# Patient Record
Sex: Female | Born: 1940 | Race: Black or African American | Hispanic: No | Marital: Married | Smoking: Never smoker
Health system: Southern US, Community
[De-identification: ages and names within clinical notes are randomized; demographics above are authoritative.]

## PROBLEM LIST (undated history)

## (undated) DIAGNOSIS — E119 Type 2 diabetes mellitus without complications: Secondary | ICD-10-CM

## (undated) DIAGNOSIS — N183 Chronic kidney disease, stage 3 unspecified: Secondary | ICD-10-CM

## (undated) DIAGNOSIS — Z975 Presence of (intrauterine) contraceptive device: Secondary | ICD-10-CM

## (undated) DIAGNOSIS — I8390 Asymptomatic varicose veins of unspecified lower extremity: Secondary | ICD-10-CM

## (undated) DIAGNOSIS — R21 Rash and other nonspecific skin eruption: Secondary | ICD-10-CM

## (undated) DIAGNOSIS — K8020 Calculus of gallbladder without cholecystitis without obstruction: Secondary | ICD-10-CM

## (undated) DIAGNOSIS — L0390 Cellulitis, unspecified: Secondary | ICD-10-CM

## (undated) DIAGNOSIS — G5600 Carpal tunnel syndrome, unspecified upper limb: Secondary | ICD-10-CM

## (undated) DIAGNOSIS — E662 Morbid (severe) obesity with alveolar hypoventilation: Secondary | ICD-10-CM

## (undated) DIAGNOSIS — E785 Hyperlipidemia, unspecified: Secondary | ICD-10-CM

## (undated) DIAGNOSIS — D649 Anemia, unspecified: Secondary | ICD-10-CM

## (undated) DIAGNOSIS — M1990 Unspecified osteoarthritis, unspecified site: Secondary | ICD-10-CM

## (undated) DIAGNOSIS — E039 Hypothyroidism, unspecified: Secondary | ICD-10-CM

## (undated) DIAGNOSIS — E876 Hypokalemia: Secondary | ICD-10-CM

## (undated) DIAGNOSIS — K7460 Unspecified cirrhosis of liver: Secondary | ICD-10-CM

## (undated) DIAGNOSIS — N8500 Endometrial hyperplasia, unspecified: Secondary | ICD-10-CM

## (undated) DIAGNOSIS — Z8719 Personal history of other diseases of the digestive system: Secondary | ICD-10-CM

## (undated) DIAGNOSIS — G4730 Sleep apnea, unspecified: Secondary | ICD-10-CM

## (undated) DIAGNOSIS — I10 Essential (primary) hypertension: Secondary | ICD-10-CM

## (undated) DIAGNOSIS — I5082 Biventricular heart failure: Secondary | ICD-10-CM

## (undated) DIAGNOSIS — K769 Liver disease, unspecified: Secondary | ICD-10-CM

## (undated) DIAGNOSIS — L120 Bullous pemphigoid: Secondary | ICD-10-CM

## (undated) DIAGNOSIS — I272 Pulmonary hypertension, unspecified: Secondary | ICD-10-CM

## (undated) HISTORY — DX: Essential (primary) hypertension: I10

## (undated) HISTORY — DX: Carpal tunnel syndrome, unspecified upper limb: G56.00

## (undated) HISTORY — DX: Unspecified osteoarthritis, unspecified site: M19.90

## (undated) HISTORY — DX: Liver disease, unspecified: K76.9

## (undated) HISTORY — PX: DILATION AND CURETTAGE OF UTERUS: SHX78

## (undated) HISTORY — DX: Calculus of gallbladder without cholecystitis without obstruction: K80.20

## (undated) HISTORY — DX: Morbid (severe) obesity due to excess calories: E66.01

## (undated) HISTORY — DX: Asymptomatic varicose veins of unspecified lower extremity: I83.90

## (undated) HISTORY — DX: Unspecified cirrhosis of liver: K74.60

## (undated) HISTORY — DX: Bullous pemphigoid: L12.0

## (undated) HISTORY — DX: Hypothyroidism, unspecified: E03.9

## (undated) HISTORY — DX: Hypokalemia: E87.6

## (undated) HISTORY — DX: Endometrial hyperplasia, unspecified: N85.00

## (undated) HISTORY — PX: TUBAL LIGATION: SHX77

## (undated) HISTORY — DX: Cellulitis, unspecified: L03.90

## (undated) HISTORY — DX: Hyperlipidemia, unspecified: E78.5

## (undated) HISTORY — DX: Anemia, unspecified: D64.9

## (undated) HISTORY — DX: Type 2 diabetes mellitus without complications: E11.9

---

## 1998-10-04 ENCOUNTER — Ambulatory Visit (HOSPITAL_COMMUNITY): Admission: RE | Admit: 1998-10-04 | Discharge: 1998-10-04 | Payer: Self-pay | Admitting: Endocrinology

## 1999-08-27 ENCOUNTER — Encounter: Payer: Self-pay | Admitting: Emergency Medicine

## 1999-08-27 ENCOUNTER — Emergency Department (HOSPITAL_COMMUNITY): Admission: EM | Admit: 1999-08-27 | Discharge: 1999-08-27 | Payer: Self-pay | Admitting: Emergency Medicine

## 2000-11-04 ENCOUNTER — Emergency Department (HOSPITAL_COMMUNITY): Admission: EM | Admit: 2000-11-04 | Discharge: 2000-11-04 | Payer: Self-pay | Admitting: Emergency Medicine

## 2001-06-26 ENCOUNTER — Emergency Department (HOSPITAL_COMMUNITY): Admission: EM | Admit: 2001-06-26 | Discharge: 2001-06-26 | Payer: Self-pay | Admitting: *Deleted

## 2001-09-09 ENCOUNTER — Emergency Department (HOSPITAL_COMMUNITY): Admission: EM | Admit: 2001-09-09 | Discharge: 2001-09-09 | Payer: Self-pay | Admitting: Emergency Medicine

## 2004-09-17 ENCOUNTER — Emergency Department (HOSPITAL_COMMUNITY): Admission: EM | Admit: 2004-09-17 | Discharge: 2004-09-18 | Payer: Self-pay | Admitting: Emergency Medicine

## 2007-05-22 HISTORY — PX: SHOULDER SURGERY: SHX246

## 2007-12-30 ENCOUNTER — Ambulatory Visit: Payer: Self-pay | Admitting: Vascular Surgery

## 2008-08-23 ENCOUNTER — Ambulatory Visit (HOSPITAL_COMMUNITY): Admission: RE | Admit: 2008-08-23 | Discharge: 2008-08-23 | Payer: Self-pay | Admitting: Orthopedic Surgery

## 2009-05-21 HISTORY — PX: CARDIAC CATHETERIZATION: SHX172

## 2009-11-25 ENCOUNTER — Inpatient Hospital Stay (HOSPITAL_COMMUNITY): Admission: EM | Admit: 2009-11-25 | Discharge: 2009-11-29 | Payer: Self-pay | Admitting: Emergency Medicine

## 2009-11-26 ENCOUNTER — Encounter (INDEPENDENT_AMBULATORY_CARE_PROVIDER_SITE_OTHER): Payer: Self-pay | Admitting: Internal Medicine

## 2009-11-26 ENCOUNTER — Ambulatory Visit: Payer: Self-pay | Admitting: Vascular Surgery

## 2010-01-17 ENCOUNTER — Encounter: Admission: RE | Admit: 2010-01-17 | Discharge: 2010-01-17 | Payer: Self-pay | Admitting: Cardiovascular Disease

## 2010-03-23 ENCOUNTER — Inpatient Hospital Stay (HOSPITAL_COMMUNITY): Admission: EM | Admit: 2010-03-23 | Discharge: 2010-03-28 | Payer: Self-pay | Admitting: Emergency Medicine

## 2010-04-10 ENCOUNTER — Ambulatory Visit: Payer: Self-pay | Admitting: Pulmonary Disease

## 2010-04-10 ENCOUNTER — Inpatient Hospital Stay (HOSPITAL_COMMUNITY): Admission: EM | Admit: 2010-04-10 | Discharge: 2010-04-14 | Payer: Self-pay | Admitting: Emergency Medicine

## 2010-04-14 ENCOUNTER — Encounter: Payer: Self-pay | Admitting: Pulmonary Disease

## 2010-04-17 ENCOUNTER — Ambulatory Visit (HOSPITAL_BASED_OUTPATIENT_CLINIC_OR_DEPARTMENT_OTHER)
Admission: RE | Admit: 2010-04-17 | Discharge: 2010-04-17 | Payer: Self-pay | Source: Home / Self Care | Admitting: Pulmonary Disease

## 2010-04-27 DIAGNOSIS — G4733 Obstructive sleep apnea (adult) (pediatric): Secondary | ICD-10-CM | POA: Insufficient documentation

## 2010-04-27 DIAGNOSIS — Z794 Long term (current) use of insulin: Secondary | ICD-10-CM

## 2010-04-27 DIAGNOSIS — E662 Morbid (severe) obesity with alveolar hypoventilation: Secondary | ICD-10-CM | POA: Insufficient documentation

## 2010-04-27 DIAGNOSIS — F329 Major depressive disorder, single episode, unspecified: Secondary | ICD-10-CM | POA: Insufficient documentation

## 2010-04-27 DIAGNOSIS — E039 Hypothyroidism, unspecified: Secondary | ICD-10-CM | POA: Insufficient documentation

## 2010-04-27 DIAGNOSIS — I5082 Biventricular heart failure: Secondary | ICD-10-CM | POA: Insufficient documentation

## 2010-04-27 DIAGNOSIS — E876 Hypokalemia: Secondary | ICD-10-CM | POA: Insufficient documentation

## 2010-04-27 DIAGNOSIS — I10 Essential (primary) hypertension: Secondary | ICD-10-CM | POA: Insufficient documentation

## 2010-04-27 DIAGNOSIS — E118 Type 2 diabetes mellitus with unspecified complications: Secondary | ICD-10-CM | POA: Insufficient documentation

## 2010-04-28 ENCOUNTER — Encounter: Payer: Self-pay | Admitting: Pulmonary Disease

## 2010-04-28 ENCOUNTER — Ambulatory Visit: Payer: Self-pay | Admitting: Pulmonary Disease

## 2010-06-11 ENCOUNTER — Encounter: Payer: Self-pay | Admitting: Orthopedic Surgery

## 2010-06-12 ENCOUNTER — Encounter: Payer: Self-pay | Admitting: Pulmonary Disease

## 2010-06-14 ENCOUNTER — Ambulatory Visit
Admission: RE | Admit: 2010-06-14 | Discharge: 2010-06-14 | Payer: Self-pay | Source: Home / Self Care | Attending: Pulmonary Disease | Admitting: Pulmonary Disease

## 2010-06-20 NOTE — Consult Note (Signed)
Summary: [place]  [place]   Imported By: Sherian Rein 04/20/2010 08:45:34  _____________________________________________________________________  External Attachment:    Type:   Image     Comment:   External Document

## 2010-06-20 NOTE — Assessment & Plan Note (Signed)
Summary: HFU PER RA (15 MINS ONLY) //KP   Visit Type:  Hospital Follow-up Primary Provider/Referring Provider:  Hayden Rasmussen, NP (Dr. Kellie Shropshire)   History of Present Illness: 70/F, morbidly obese for post hospital FU of obstructive sleep apnea/ OHS . Adm 04/10/10 with worsening dyspne a7 hypoxia, VQ neg, BNP 103, diuresed with lasix, diuresed, ABG 7.43/63/89/97% on 2 L, discharged on 2 L & Bilevel (empiric) ? 14/8 PSG subsequently showed moderate obstructive sleep apnea with AHi 15/h. She is to be evaluated by [facility] Daphine Deutscher ) for  a hepatic mass Echo july'11 >> EF 45-50%septal/ inferoseptal hypokinesis Algie Coffer)  April 28, 2010 4:00 PM  70/F, morbidly obese tolerating Bipap, full face mask well well, I dont have to nap, pressure OK Was able to walk at [employer] , compliant with O2, breathing better  Preventive Screening-Counseling & Management  Alcohol-Tobacco     Alcohol drinks/day: 0     Smoking Status: never  Current Medications (verified): 1)  Klor-Con M20 20 Meq Cr-Tabs (Potassium Chloride Crys Cr) .Marland Kitchen.. 1 Three Times A Day 2)  Vitamin B-12 100 Mcg Tabs (Cyanocobalamin) .Marland Kitchen.. 1 Once Daily 3)  Humulin 70/30 70-30 % Susp (Insulin Isophane & Regular) .... 20 Units At Bedtime 4)  Janumet 50-1000 Mg Tabs (Sitagliptin-Metformin Hcl) .Marland Kitchen.. 1 Two Times A Day 5)  Lasix 40 Mg Tabs (Furosemide) .... Take 1 & 1/2  Tablet By Mouth Two Times A Day 6)  Synthroid 50 Mcg Tabs (Levothyroxine Sodium) .Marland Kitchen.. 1 Once Daily 7)  Norvasc 5 Mg Tabs (Amlodipine Besylate) .Marland Kitchen.. 1 Once Daily 8)  Multivitamins  Tabs (Multiple Vitamin) .... 1/2 Once Daily 9)  Oxycodone Hcl 5 Mg Tabs (Oxycodone Hcl) .Marland Kitchen.. 1 Every 4 Hrs As Needed 10)  Zoloft 50 Mg Tabs (Sertraline Hcl) .Marland Kitchen.. 1 Once Daily 11)  Cpap 12)  Oxygen .... 2 Liters Continuous 13)  Cranberry Concentrate 500 Mg Caps (Cranberry) .... Take 3 Tablet By Mouth Once A Day  Allergies: 1)  ! Cephalosporins 2)  ! Keflex  Past History:  Past Medical History: Diabetes, Type  2 Hypothyroidism  Family History: Throat cancer-mother Family History Diabetes-maternal grandmother and father Leukemia-father  Social History: Marital Status: Married Children: Yes, 4 Occupation: Retired Child psychotherapist Patient never smoked.  Smoking Status:  never Alcohol drinks/day:  0  Review of Systems       The patient complains of shortness of breath with activity, non-productive cough, loss of appetite, weight change, tooth/dental problems, headaches, anxiety, and depression.  The patient denies shortness of breath at rest, productive cough, coughing up blood, chest pain, irregular heartbeats, acid heartburn, indigestion, abdominal pain, difficulty swallowing, sore throat, nasal congestion/difficulty breathing through nose, sneezing, itching, ear ache, hand/feet swelling, joint stiffness or pain, rash, change in color of mucus, and fever.    Vital Signs:  Patient profile:   70 year old female Height:      65 inches Weight:      380.6 pounds BMI:     63.56 O2 Sat:      91 % on 2 L/min pulsed Temp:     98.2 degrees F oral Pulse rate:   98 / minute BP sitting:   118 / 82  (left arm) Cuff size:   large  Vitals Entered By: Zackery Barefoot CMA (April 28, 2010 3:01 PM)  O2 Flow:  2 L/min pulsed Comments Medications reviewed with patient Verified contact number and pharmacy with patient Zackery Barefoot CMA  April 28, 2010 3:02 PM    Physical Exam  Additional Exam:  wt 381 April 29, 2010 Gen. Pleasant, obese, in no distress, normal affect ENT - no lesions, no post nasal drip Neck: No JVD, no thyromegaly, no carotid bruits Lungs: no use of accessory muscles, no dullness to percussion,decreased without rales or rhonchi  Cardiovascular: Rhythm regular, heart sounds  normal, no murmurs or gallops, no peripheral edema Abdomen: soft and non-tender, no hepatosplenomegaly, BS normal. Musculoskeletal: No deformities, no cyanosis or clubbing Neuro:  alert, non focal      Impression & Recommendations:  Problem # 1:  OBSTRUCTIVE SLEEP APNEA (ICD-327.23) The pathophysiology of obstructive sleep apnea, it's cardiovascular consequences and modes of treatment including CPAP were discussed with the patient in great detail. Compliance encouraged, wt loss emphasized, asked to avoid meds with sedative side effects, cautioned against driving when sleepy.  ct bilevel - obtian download Orders: Est. 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 Take 1 & 1/2  tablet by mouth two times a day 2)  Multivitamins Tabs (Multiple vitamin) .... 1/2 once daily 3)  Cpap  4)  Oxygen  .... 2 liters continuous 5)  Cranberry Concentrate 500 Mg Caps (Cranberry) .... Take 3 tablet by mouth once a day  Patient Instructions: 1)  Copy sent to: Andi Devon 2)  Please schedule a follow-up appointment in 1 month. 3)  I will take care of your biPAP machine & oxygen   Immunization History:  Influenza Immunization History:    Influenza:  historical (01/23/2010)

## 2010-06-22 NOTE — Assessment & Plan Note (Signed)
Summary: rov 1 month///kp   Visit Type:  Follow-up Primary Provider/Referring Provider:  Hayden Rasmussen, NP (Dr. Kellie Shropshire)  CC:  Pt states is using BiPAP every night. Pt c/o sweating .  History of Present Illness: 70/F, morbidly obese for post hospital FU of obstructive sleep apnea/ OHS . Adm 04/10/10 with worsening dyspnea & hypoxia, VQ neg, BNP 103, diuresed with lasix, diuresed, ABG 7.43/63/89/97% on 2 L, discharged on 2 L & Bilevel (empiric) ? 14/8 PSG subsequently showed moderate obstructive sleep apnea with AHi 15/h. She was evaluated by [facility] Donell Beers ) for  a hepatic mass >> hemangioma Echo july'11 >> EF 45-50%septal/ inferoseptal hypokinesis Algie Coffer)   June 14, 2010 3:27 PM   tolerating Bipap, full face mask well, I dont have to nap, pressure OK Was able to walk at [employer] , compliant with O2, breathing better has lost 50 lbs since july - 419 lbs then, can walk up & down stairs x3 daily now. Compliant with O2, download 12/25-1/23/12 shows good compliance, residual AHI < 3/h, avg pr 12/8 occ leak+, pressure OK, no dryness, feels refreshed & alert   Preventive Screening-Counseling & Management  Alcohol-Tobacco     Alcohol drinks/day: 0     Smoking Status: never  Current Medications (verified): 1)  Klor-Con M20 20 Meq Cr-Tabs (Potassium Chloride Crys Cr) .Marland Kitchen.. 1 Three Times A Day 2)  Vitamin B-12 100 Mcg Tabs (Cyanocobalamin) .Marland Kitchen.. 1 Once Daily 3)  Humulin 70/30 70-30 % Susp (Insulin Isophane & Regular) .... 20 Units Two Times A Day 4)  Janumet 50-1000 Mg Tabs (Sitagliptin-Metformin Hcl) .Marland Kitchen.. 1 Two Times A Day 5)  Lasix 40 Mg Tabs (Furosemide) .... Take 1 & 1/2  Tablet By Mouth Two Times A Day 6)  Synthroid 50 Mcg Tabs (Levothyroxine Sodium) .Marland Kitchen.. 1 Once Daily 7)  Norvasc 5 Mg Tabs (Amlodipine Besylate) .Marland Kitchen.. 1 Once Daily 8)  Multivitamins  Tabs (Multiple Vitamin) .... 1/2 Once Daily 9)  Oxycodone Hcl 5 Mg Tabs (Oxycodone Hcl) .Marland Kitchen.. 1 Every 4 Hrs As Needed 10)  Zoloft 50 Mg  Tabs (Sertraline Hcl) .Marland Kitchen.. 1 Once Daily 11)  Cpap 12)  Oxygen .... 2 Liters Continuous 13)  Cranberry Concentrate 500 Mg Caps (Cranberry) .... Take 3 Tablet By Mouth Once A Day 14)  Stool Softener 100 Mg Caps (Docusate Sodium) .... As Needed  Allergies (verified): 1)  ! Cephalosporins 2)  ! 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   Review of Systems  The patient denies anorexia, fever, weight loss, weight gain, vision loss, decreased hearing, hoarseness, chest pain, syncope, dyspnea on exertion, peripheral edema, prolonged cough, headaches, hemoptysis, abdominal pain, melena, hematochezia, severe indigestion/heartburn, hematuria, muscle weakness, suspicious skin lesions, difficulty walking, depression, unusual weight change, abnormal bleeding, enlarged lymph nodes, and angioedema.    Vital Signs:  Patient profile:   70 year old female Height:      65 inches Weight:      373 pounds BMI:     62.29 O2 Sat:      95 % on 2 L/min Temp:     98.2 degrees F oral Pulse rate:   81 / minute BP sitting:   110 / 70  (left arm) Cuff size:   large  Vitals Entered By: Zackery Barefoot CMA (June 14, 2010 3:00 PM)  O2 Flow:  2 L/min  O2 Sat Comments Upon arrival pt's O2 89% 2 liters pulsed, and recovered to 95% 2 liters pulsed. Zackery Barefoot CMA  June 14, 2010 3:06 PM  CC: Pt states is using BiPAP every night. Pt c/o sweating  Comments Medications reviewed with patient Verified contact number and pharmacy with patient Zackery Barefoot [facility]  June 14, 2010 3:03 PM    Physical Exam  Additional Exam:  wt 381 April 29, 2010, 373 June 15, 2010 Gen. Pleasant, obese, in no distress, normal affect ENT - no lesions, no post nasal drip Neck: No JVD, no thyromegaly, no carotid  bruits Lungs: no use of accessory muscles, no dullness to percussion,decreased without rales or rhonchi  Cardiovascular: Rhythm regular, heart sounds  normal, no murmurs or gallops, no peripheral edema Musculoskeletal: No deformities, no cyanosis or clubbing      Impression & Recommendations:  Problem # 1:  OBSTRUCTIVE SLEEP APNEA (ICD-327.23)  Excellent response to bipap & good comlpiance Compliance encouraged, wt loss emphasized, asked to avoid meds with sedative side effects, cautioned against driving when sleepy.   Orders: Est. 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  Orders: Est. Patient Level III (60454)  Medications Added to Medication List This Visit: 1)  Humulin 70/30 70-30 % Susp (Insulin isophane & regular) .... 20 units two times a day 2)  Stool Softener 100 Mg Caps (Docusate sodium) .... As needed  Patient Instructions: 1)  Copy sent [id number] [place]ton 2)  Please schedule a follow-up appointment in 3-4 months. 3)  Stay on your BiPAP machine every night - this is really helping 4)  Keep working on your weight loss 5)  Stay on oxygen until your next evaluation

## 2010-08-01 LAB — CBC
HCT: 34.5 % — ABNORMAL LOW (ref 36.0–46.0)
HCT: 34.5 % — ABNORMAL LOW (ref 36.0–46.0)
HCT: 34.8 % — ABNORMAL LOW (ref 36.0–46.0)
HCT: 34.8 % — ABNORMAL LOW (ref 36.0–46.0)
HCT: 37.3 % (ref 36.0–46.0)
Hemoglobin: 10.6 g/dL — ABNORMAL LOW (ref 12.0–15.0)
Hemoglobin: 10.8 g/dL — ABNORMAL LOW (ref 12.0–15.0)
Hemoglobin: 10.8 g/dL — ABNORMAL LOW (ref 12.0–15.0)
Hemoglobin: 10.9 g/dL — ABNORMAL LOW (ref 12.0–15.0)
Hemoglobin: 11.1 g/dL — ABNORMAL LOW (ref 12.0–15.0)
Hemoglobin: 11.2 g/dL — ABNORMAL LOW (ref 12.0–15.0)
Hemoglobin: 11.3 g/dL — ABNORMAL LOW (ref 12.0–15.0)
Hemoglobin: 11.8 g/dL — ABNORMAL LOW (ref 12.0–15.0)
MCH: 28.2 pg (ref 26.0–34.0)
MCH: 28.3 pg (ref 26.0–34.0)
MCH: 28.6 pg (ref 26.0–34.0)
MCH: 28.6 pg (ref 26.0–34.0)
MCH: 28.6 pg (ref 26.0–34.0)
MCH: 28.6 pg (ref 26.0–34.0)
MCH: 28.8 pg (ref 26.0–34.0)
MCHC: 30.7 g/dL (ref 30.0–36.0)
MCHC: 30.8 g/dL (ref 30.0–36.0)
MCHC: 31.1 g/dL (ref 30.0–36.0)
MCHC: 31.3 g/dL (ref 30.0–36.0)
MCHC: 31.3 g/dL (ref 30.0–36.0)
MCHC: 32.2 g/dL (ref 30.0–36.0)
MCV: 90.4 fL (ref 78.0–100.0)
MCV: 90.5 fL (ref 78.0–100.0)
MCV: 91.3 fL (ref 78.0–100.0)
MCV: 91.8 fL (ref 78.0–100.0)
MCV: 93 fL (ref 78.0–100.0)
Platelets: 303 10*3/uL (ref 150–400)
Platelets: 304 10*3/uL (ref 150–400)
Platelets: 308 10*3/uL (ref 150–400)
RBC: 3.76 MIL/uL — ABNORMAL LOW (ref 3.87–5.11)
RBC: 3.81 MIL/uL — ABNORMAL LOW (ref 3.87–5.11)
RBC: 3.81 MIL/uL — ABNORMAL LOW (ref 3.87–5.11)
RBC: 3.85 MIL/uL — ABNORMAL LOW (ref 3.87–5.11)
RBC: 4.12 MIL/uL (ref 3.87–5.11)
RDW: 15.5 % (ref 11.5–15.5)
RDW: 16.9 % — ABNORMAL HIGH (ref 11.5–15.5)
WBC: 12.2 10*3/uL — ABNORMAL HIGH (ref 4.0–10.5)
WBC: 12.5 10*3/uL — ABNORMAL HIGH (ref 4.0–10.5)
WBC: 9.3 10*3/uL (ref 4.0–10.5)
WBC: 9.4 10*3/uL (ref 4.0–10.5)

## 2010-08-01 LAB — DIFFERENTIAL
Basophils Absolute: 0 10*3/uL (ref 0.0–0.1)
Basophils Absolute: 0 10*3/uL (ref 0.0–0.1)
Basophils Absolute: 0 10*3/uL (ref 0.0–0.1)
Basophils Absolute: 0 10*3/uL (ref 0.0–0.1)
Basophils Absolute: 0 K/uL (ref 0.0–0.1)
Basophils Relative: 0 % (ref 0–1)
Basophils Relative: 0 % (ref 0–1)
Basophils Relative: 0 % (ref 0–1)
Eosinophils Absolute: 0.2 10*3/uL (ref 0.0–0.7)
Eosinophils Absolute: 0.2 K/uL (ref 0.0–0.7)
Eosinophils Relative: 1 % (ref 0–5)
Eosinophils Relative: 2 % (ref 0–5)
Eosinophils Relative: 2 % (ref 0–5)
Eosinophils Relative: 2 % (ref 0–5)
Lymphocytes Relative: 13 % (ref 12–46)
Lymphocytes Relative: 18 % (ref 12–46)
Lymphocytes Relative: 20 % (ref 12–46)
Lymphocytes Relative: 22 % (ref 12–46)
Lymphs Abs: 1.6 10*3/uL (ref 0.7–4.0)
Lymphs Abs: 1.7 10*3/uL (ref 0.7–4.0)
Monocytes Absolute: 0.7 10*3/uL (ref 0.1–1.0)
Monocytes Absolute: 0.8 10*3/uL (ref 0.1–1.0)
Monocytes Relative: 6 % (ref 3–12)
Neutro Abs: 6.1 10*3/uL (ref 1.7–7.7)
Neutro Abs: 6.4 10*3/uL (ref 1.7–7.7)
Neutro Abs: 9.9 K/uL — ABNORMAL HIGH (ref 1.7–7.7)
Neutrophils Relative %: 69 % (ref 43–77)
Neutrophils Relative %: 72 % (ref 43–77)
Neutrophils Relative %: 80 % — ABNORMAL HIGH (ref 43–77)

## 2010-08-01 LAB — GLUCOSE, CAPILLARY
Glucose-Capillary: 112 mg/dL — ABNORMAL HIGH (ref 70–99)
Glucose-Capillary: 114 mg/dL — ABNORMAL HIGH (ref 70–99)
Glucose-Capillary: 116 mg/dL — ABNORMAL HIGH (ref 70–99)
Glucose-Capillary: 125 mg/dL — ABNORMAL HIGH (ref 70–99)
Glucose-Capillary: 139 mg/dL — ABNORMAL HIGH (ref 70–99)
Glucose-Capillary: 140 mg/dL — ABNORMAL HIGH (ref 70–99)
Glucose-Capillary: 141 mg/dL — ABNORMAL HIGH (ref 70–99)
Glucose-Capillary: 162 mg/dL — ABNORMAL HIGH (ref 70–99)
Glucose-Capillary: 177 mg/dL — ABNORMAL HIGH (ref 70–99)
Glucose-Capillary: 181 mg/dL — ABNORMAL HIGH (ref 70–99)
Glucose-Capillary: 185 mg/dL — ABNORMAL HIGH (ref 70–99)
Glucose-Capillary: 191 mg/dL — ABNORMAL HIGH (ref 70–99)
Glucose-Capillary: 202 mg/dL — ABNORMAL HIGH (ref 70–99)
Glucose-Capillary: 206 mg/dL — ABNORMAL HIGH (ref 70–99)
Glucose-Capillary: 218 mg/dL — ABNORMAL HIGH (ref 70–99)
Glucose-Capillary: 221 mg/dL — ABNORMAL HIGH (ref 70–99)
Glucose-Capillary: 226 mg/dL — ABNORMAL HIGH (ref 70–99)
Glucose-Capillary: 227 mg/dL — ABNORMAL HIGH (ref 70–99)
Glucose-Capillary: 230 mg/dL — ABNORMAL HIGH (ref 70–99)
Glucose-Capillary: 244 mg/dL — ABNORMAL HIGH (ref 70–99)
Glucose-Capillary: 251 mg/dL — ABNORMAL HIGH (ref 70–99)
Glucose-Capillary: 254 mg/dL — ABNORMAL HIGH (ref 70–99)
Glucose-Capillary: 257 mg/dL — ABNORMAL HIGH (ref 70–99)
Glucose-Capillary: 272 mg/dL — ABNORMAL HIGH (ref 70–99)
Glucose-Capillary: 279 mg/dL — ABNORMAL HIGH (ref 70–99)
Glucose-Capillary: 428 mg/dL — ABNORMAL HIGH (ref 70–99)
Glucose-Capillary: 453 mg/dL — ABNORMAL HIGH (ref 70–99)
Glucose-Capillary: 70 mg/dL (ref 70–99)
Glucose-Capillary: 88 mg/dL (ref 70–99)

## 2010-08-01 LAB — BLOOD GAS, ARTERIAL
Bicarbonate: 41.5 mEq/L — ABNORMAL HIGH (ref 20.0–24.0)
Delivery systems: POSITIVE
Drawn by: 24487
Expiratory PAP: 8
O2 Saturation: 97.4 %
pCO2 arterial: 63 mmHg (ref 35.0–45.0)
pH, Arterial: 7.434 — ABNORMAL HIGH (ref 7.350–7.400)
pO2, Arterial: 89.1 mmHg (ref 80.0–100.0)

## 2010-08-01 LAB — BASIC METABOLIC PANEL
CO2: 41 mEq/L (ref 19–32)
Calcium: 9 mg/dL (ref 8.4–10.5)
Chloride: 88 mEq/L — ABNORMAL LOW (ref 96–112)
Chloride: 91 mEq/L — ABNORMAL LOW (ref 96–112)
Creatinine, Ser: 0.6 mg/dL (ref 0.4–1.2)
Creatinine, Ser: 0.63 mg/dL (ref 0.4–1.2)
GFR calc Af Amer: >60 mL/min (ref >60)
GFR calc Af Amer: >60 mL/min (ref >60)
GFR calc Af Amer: >60 mL/min (ref >60)
GFR calc non Af Amer: >60 mL/min (ref >60)
Glucose, Bld: 218 mg/dL — ABNORMAL HIGH (ref 70–99)
Potassium: 3.2 mEq/L — ABNORMAL LOW (ref 3.5–5.1)
Potassium: 3.3 mEq/L — ABNORMAL LOW (ref 3.5–5.1)
Sodium: 138 mEq/L (ref 135–145)
Sodium: 139 mEq/L (ref 135–145)

## 2010-08-01 LAB — POCT CARDIAC MARKERS
CKMB, poc: <1 ng/mL — ABNORMAL LOW (ref 1.0–8.0)
CKMB, poc: <1 ng/mL — ABNORMAL LOW (ref 1.0–8.0)
Myoglobin, poc: 52.2 ng/mL (ref 12–200)
Myoglobin, poc: 60.6 ng/mL (ref 12–200)
Myoglobin, poc: 65.4 ng/mL (ref 12–200)
Troponin i, poc: <0.05 ng/mL (ref 0.00–0.09)
Troponin i, poc: <0.05 ng/mL (ref 0.00–0.09)

## 2010-08-01 LAB — CULTURE, BLOOD (ROUTINE X 2): Culture: NO GROWTH

## 2010-08-01 LAB — URINE CULTURE
Colony Count: >=100000
Culture  Setup Time: 201111040124

## 2010-08-01 LAB — COMPREHENSIVE METABOLIC PANEL
ALT: 17 U/L (ref 0–35)
ALT: 19 U/L (ref 0–35)
AST: 15 U/L (ref 0–37)
AST: 15 U/L (ref 0–37)
AST: 17 U/L (ref 0–37)
AST: 18 U/L (ref 0–37)
Albumin: 3 g/dL — ABNORMAL LOW (ref 3.5–5.2)
Albumin: 3.1 g/dL — ABNORMAL LOW (ref 3.5–5.2)
Alkaline Phosphatase: 99 U/L (ref 39–117)
BUN: 14 mg/dL (ref 6–23)
BUN: 15 mg/dL (ref 6–23)
BUN: 17 mg/dL (ref 6–23)
CO2: 38 mEq/L — ABNORMAL HIGH (ref 19–32)
CO2: 38 mEq/L — ABNORMAL HIGH (ref 19–32)
CO2: 39 mEq/L — ABNORMAL HIGH (ref 19–32)
CO2: 39 mEq/L — ABNORMAL HIGH (ref 19–32)
CO2: 40 mEq/L — ABNORMAL HIGH (ref 19–32)
Calcium: 8.6 mg/dL (ref 8.4–10.5)
Calcium: 8.6 mg/dL (ref 8.4–10.5)
Calcium: 8.8 mg/dL (ref 8.4–10.5)
Calcium: 9.1 mg/dL (ref 8.4–10.5)
Chloride: 89 mEq/L — ABNORMAL LOW (ref 96–112)
Chloride: 92 mEq/L — ABNORMAL LOW (ref 96–112)
Chloride: 94 mEq/L — ABNORMAL LOW (ref 96–112)
Chloride: 94 mEq/L — ABNORMAL LOW (ref 96–112)
Chloride: 94 mEq/L — ABNORMAL LOW (ref 96–112)
Creatinine, Ser: 0.59 mg/dL (ref 0.4–1.2)
Creatinine, Ser: 0.64 mg/dL (ref 0.4–1.2)
Creatinine, Ser: 0.67 mg/dL (ref 0.4–1.2)
Creatinine, Ser: 0.68 mg/dL (ref 0.4–1.2)
Creatinine, Ser: 0.81 mg/dL (ref 0.4–1.2)
Creatinine, Ser: 0.84 mg/dL (ref 0.4–1.2)
GFR calc Af Amer: >60 mL/min (ref >60)
GFR calc Af Amer: >60 mL/min (ref >60)
GFR calc Af Amer: >60 mL/min (ref >60)
GFR calc Af Amer: >60 mL/min (ref >60)
GFR calc non Af Amer: >60 mL/min (ref >60)
GFR calc non Af Amer: >60 mL/min (ref >60)
GFR calc non Af Amer: >60 mL/min (ref >60)
GFR calc non Af Amer: >60 mL/min (ref >60)
GFR calc non Af Amer: >60 mL/min (ref >60)
Glucose, Bld: 107 mg/dL — ABNORMAL HIGH (ref 70–99)
Glucose, Bld: 188 mg/dL — ABNORMAL HIGH (ref 70–99)
Glucose, Bld: 200 mg/dL — ABNORMAL HIGH (ref 70–99)
Potassium: 3.9 mEq/L (ref 3.5–5.1)
Sodium: 138 mEq/L (ref 135–145)
Total Bilirubin: 0.5 mg/dL (ref 0.3–1.2)
Total Bilirubin: 0.5 mg/dL (ref 0.3–1.2)
Total Bilirubin: 0.6 mg/dL (ref 0.3–1.2)
Total Bilirubin: 0.7 mg/dL (ref 0.3–1.2)

## 2010-08-01 LAB — URINALYSIS, ROUTINE W REFLEX MICROSCOPIC
Bilirubin Urine: NEGATIVE
Glucose, UA: NEGATIVE mg/dL
Ketones, ur: NEGATIVE mg/dL
Ketones, ur: NEGATIVE mg/dL
Leukocytes, UA: NEGATIVE
Nitrite: NEGATIVE
Nitrite: POSITIVE — AB
Protein, ur: NEGATIVE mg/dL
Protein, ur: NEGATIVE mg/dL
Specific Gravity, Urine: 1.012 (ref 1.005–1.030)
Urobilinogen, UA: 0.2 mg/dL (ref 0.0–1.0)
Urobilinogen, UA: 1 mg/dL (ref 0.0–1.0)
pH: 7 (ref 5.0–8.0)

## 2010-08-01 LAB — POCT I-STAT, CHEM 8
Calcium, Ion: 1.01 mmol/L — ABNORMAL LOW (ref 1.12–1.32)
HCT: 41 % (ref 36.0–46.0)
Hemoglobin: 13.9 g/dL (ref 12.0–15.0)
Sodium: 138 mEq/L (ref 135–145)
TCO2: 37 mmol/L (ref 0–100)

## 2010-08-01 LAB — POCT I-STAT 3, VENOUS BLOOD GAS (G3P V)
Acid-Base Excess: 15 mmol/L — ABNORMAL HIGH (ref 0.0–2.0)
Bicarbonate: 44.6 meq/L — ABNORMAL HIGH (ref 20.0–24.0)
O2 Saturation: 51 %
Patient temperature: 98
TCO2: 47 mmol/L (ref 0–100)
pCO2, Ven: 75.5 mmHg (ref 45.0–50.0)
pH, Ven: 7.378 — ABNORMAL HIGH (ref 7.250–7.300)
pO2, Ven: 29 mmHg — CL (ref 30.0–45.0)

## 2010-08-01 LAB — URINE MICROSCOPIC-ADD ON

## 2010-08-01 LAB — BASIC METABOLIC PANEL WITH GFR
BUN: 14 mg/dL (ref 6–23)
CO2: 40 meq/L — ABNORMAL HIGH (ref 19–32)
Calcium: 8.3 mg/dL — ABNORMAL LOW (ref 8.4–10.5)
Creatinine, Ser: 0.49 mg/dL (ref 0.4–1.2)
GFR calc non Af Amer: >60 mL/min (ref >60)
Glucose, Bld: 161 mg/dL — ABNORMAL HIGH (ref 70–99)
Sodium: 138 meq/L (ref 135–145)

## 2010-08-01 LAB — SEDIMENTATION RATE: Sed Rate: 31 mm/hr — ABNORMAL HIGH (ref 0–22)

## 2010-08-01 LAB — MAGNESIUM
Magnesium: 1.3 mg/dL — ABNORMAL LOW (ref 1.5–2.5)
Magnesium: 1.4 mg/dL — ABNORMAL LOW (ref 1.5–2.5)
Magnesium: 1.4 mg/dL — ABNORMAL LOW (ref 1.5–2.5)
Magnesium: 1.4 mg/dL — ABNORMAL LOW (ref 1.5–2.5)

## 2010-08-01 LAB — BRAIN NATRIURETIC PEPTIDE
Pro B Natriuretic peptide (BNP): 103 pg/mL — ABNORMAL HIGH (ref 0.0–100.0)
Pro B Natriuretic peptide (BNP): 207 pg/mL — ABNORMAL HIGH (ref 0.0–100.0)
Pro B Natriuretic peptide (BNP): 270 pg/mL — ABNORMAL HIGH (ref 0.0–100.0)

## 2010-08-01 LAB — GLUCOSE, RANDOM: Glucose, Bld: 447 mg/dL — ABNORMAL HIGH (ref 70–99)

## 2010-08-01 LAB — CK TOTAL AND CKMB (NOT AT ARMC)
CK, MB: 0.7 ng/mL (ref 0.3–4.0)
Relative Index: INVALID (ref 0.0–2.5)
Total CK: 28 U/L (ref 7–177)

## 2010-08-01 LAB — T4, FREE: Free T4: 1.26 ng/dL (ref 0.80–1.80)

## 2010-08-01 LAB — TSH
TSH: 0.612 u[IU]/mL (ref 0.350–4.500)
TSH: 3.543 u[IU]/mL (ref 0.350–4.500)

## 2010-08-01 LAB — TROPONIN I: Troponin I: 0.02 ng/mL (ref 0.00–0.06)

## 2010-08-01 LAB — PROTIME-INR: INR: 1 (ref 0.00–1.49)

## 2010-08-01 LAB — AFP TUMOR MARKER: AFP-Tumor Marker: 2.2 ng/mL (ref 0.0–8.0)

## 2010-08-06 LAB — GLUCOSE, CAPILLARY
Glucose-Capillary: 100 mg/dL — ABNORMAL HIGH (ref 70–99)
Glucose-Capillary: 106 mg/dL — ABNORMAL HIGH (ref 70–99)
Glucose-Capillary: 132 mg/dL — ABNORMAL HIGH (ref 70–99)
Glucose-Capillary: 132 mg/dL — ABNORMAL HIGH (ref 70–99)
Glucose-Capillary: 165 mg/dL — ABNORMAL HIGH (ref 70–99)
Glucose-Capillary: 174 mg/dL — ABNORMAL HIGH (ref 70–99)
Glucose-Capillary: 184 mg/dL — ABNORMAL HIGH (ref 70–99)
Glucose-Capillary: 186 mg/dL — ABNORMAL HIGH (ref 70–99)
Glucose-Capillary: 220 mg/dL — ABNORMAL HIGH (ref 70–99)
Glucose-Capillary: 314 mg/dL — ABNORMAL HIGH (ref 70–99)

## 2010-08-06 LAB — DIFFERENTIAL
Eosinophils Absolute: 0.2 10*3/uL (ref 0.0–0.7)
Eosinophils Relative: 2 % (ref 0–5)
Lymphocytes Relative: 22 % (ref 12–46)
Lymphs Abs: 2.3 10*3/uL (ref 0.7–4.0)
Monocytes Absolute: 0.6 10*3/uL (ref 0.1–1.0)

## 2010-08-06 LAB — CARDIAC PANEL(CRET KIN+CKTOT+MB+TROPI)
Relative Index: INVALID (ref 0.0–2.5)
Total CK: 48 U/L (ref 7–177)
Troponin I: 0.01 ng/mL (ref 0.00–0.06)

## 2010-08-06 LAB — T4, FREE: Free T4: 1.18 ng/dL (ref 0.80–1.80)

## 2010-08-06 LAB — BASIC METABOLIC PANEL
BUN: 34 mg/dL — ABNORMAL HIGH (ref 6–23)
CO2: 33 mEq/L — ABNORMAL HIGH (ref 19–32)
Calcium: 9.1 mg/dL (ref 8.4–10.5)
Chloride: 101 mEq/L (ref 96–112)
Chloride: 98 mEq/L (ref 96–112)
Creatinine, Ser: 0.9 mg/dL (ref 0.4–1.2)
GFR calc Af Amer: >60 mL/min (ref >60)
GFR calc Af Amer: >60 mL/min (ref >60)
GFR calc non Af Amer: >60 mL/min (ref >60)
GFR calc non Af Amer: >60 mL/min (ref >60)
Potassium: 5 mEq/L (ref 3.5–5.1)
Potassium: 5.1 mEq/L (ref 3.5–5.1)
Potassium: 5.1 mEq/L (ref 3.5–5.1)
Sodium: 135 mEq/L (ref 135–145)
Sodium: 137 mEq/L (ref 135–145)
Sodium: 138 mEq/L (ref 135–145)

## 2010-08-06 LAB — COMPREHENSIVE METABOLIC PANEL
AST: 19 U/L (ref 0–37)
Albumin: 3 g/dL — ABNORMAL LOW (ref 3.5–5.2)
Alkaline Phosphatase: 93 U/L (ref 39–117)
BUN: 24 mg/dL — ABNORMAL HIGH (ref 6–23)
GFR calc Af Amer: >60 mL/min (ref >60)
Potassium: 4.7 mEq/L (ref 3.5–5.1)
Total Protein: 6.8 g/dL (ref 6.0–8.3)

## 2010-08-06 LAB — POTASSIUM: Potassium: 6.6 mEq/L (ref 3.5–5.1)

## 2010-08-06 LAB — CBC
HCT: 33.6 % — ABNORMAL LOW (ref 36.0–46.0)
HCT: 38.7 % (ref 36.0–46.0)
Hemoglobin: 10.7 g/dL — ABNORMAL LOW (ref 12.0–15.0)
MCH: 29 pg (ref 26.0–34.0)
MCV: 91 fL (ref 78.0–100.0)
MCV: 91.7 fL (ref 78.0–100.0)
Platelets: 265 10*3/uL (ref 150–400)
Platelets: 289 10*3/uL (ref 150–400)
RBC: 4.25 MIL/uL (ref 3.87–5.11)
RDW: 16.2 % — ABNORMAL HIGH (ref 11.5–15.5)
RDW: 16.3 % — ABNORMAL HIGH (ref 11.5–15.5)
WBC: 10.4 10*3/uL (ref 4.0–10.5)
WBC: 7.2 10*3/uL (ref 4.0–10.5)
WBC: 9.4 10*3/uL (ref 4.0–10.5)

## 2010-08-06 LAB — POCT I-STAT, CHEM 8
BUN: 29 mg/dL — ABNORMAL HIGH (ref 6–23)
Calcium, Ion: 1.04 mmol/L — ABNORMAL LOW (ref 1.12–1.32)
Creatinine, Ser: 0.8 mg/dL (ref 0.4–1.2)
Glucose, Bld: 149 mg/dL — ABNORMAL HIGH (ref 70–99)
Hemoglobin: 14.6 g/dL (ref 12.0–15.0)
TCO2: 28 mmol/L (ref 0–100)

## 2010-08-06 LAB — MAGNESIUM
Magnesium: 1.7 mg/dL (ref 1.5–2.5)
Magnesium: 1.8 mg/dL (ref 1.5–2.5)
Magnesium: 1.9 mg/dL (ref 1.5–2.5)

## 2010-08-06 LAB — BRAIN NATRIURETIC PEPTIDE: Pro B Natriuretic peptide (BNP): 154 pg/mL — ABNORMAL HIGH (ref 0.0–100.0)

## 2010-08-06 LAB — TSH: TSH: 7.283 u[IU]/mL — ABNORMAL HIGH (ref 0.350–4.500)

## 2010-08-06 LAB — HEMOGLOBIN A1C: Mean Plasma Glucose: 206 mg/dL — ABNORMAL HIGH (ref <117)

## 2010-08-30 LAB — GLUCOSE, CAPILLARY

## 2010-08-31 LAB — COMPREHENSIVE METABOLIC PANEL
AST: 13 U/L (ref 0–37)
BUN: 19 mg/dL (ref 6–23)
CO2: 30 mEq/L (ref 19–32)
Calcium: 9.2 mg/dL (ref 8.4–10.5)
Creatinine, Ser: 0.68 mg/dL (ref 0.4–1.2)
GFR calc Af Amer: >60 mL/min (ref >60)
GFR calc non Af Amer: >60 mL/min (ref >60)
Total Bilirubin: 0.3 mg/dL (ref 0.3–1.2)

## 2010-08-31 LAB — URINE MICROSCOPIC-ADD ON

## 2010-08-31 LAB — URINALYSIS, ROUTINE W REFLEX MICROSCOPIC
Bilirubin Urine: NEGATIVE
Glucose, UA: NEGATIVE mg/dL
Ketones, ur: NEGATIVE mg/dL
Nitrite: POSITIVE — AB
Specific Gravity, Urine: 1.022 (ref 1.005–1.030)
pH: 7 (ref 5.0–8.0)

## 2010-08-31 LAB — DIFFERENTIAL
Basophils Absolute: 0.1 10*3/uL (ref 0.0–0.1)
Lymphocytes Relative: 26 % (ref 12–46)
Lymphs Abs: 1.8 10*3/uL (ref 0.7–4.0)
Neutro Abs: 4.3 10*3/uL (ref 1.7–7.7)
Neutrophils Relative %: 63 % (ref 43–77)

## 2010-08-31 LAB — URINE CULTURE

## 2010-08-31 LAB — CBC
HCT: 36.2 % (ref 36.0–46.0)
MCHC: 32.1 g/dL (ref 30.0–36.0)
MCV: 89.9 fL (ref 78.0–100.0)
RBC: 4.02 MIL/uL (ref 3.87–5.11)

## 2010-08-31 LAB — APTT: aPTT: 29 seconds (ref 24–37)

## 2010-08-31 LAB — PROTIME-INR
INR: 0.9 (ref 0.00–1.49)
Prothrombin Time: 12.1 seconds (ref 11.6–15.2)

## 2010-09-21 ENCOUNTER — Encounter (INDEPENDENT_AMBULATORY_CARE_PROVIDER_SITE_OTHER): Payer: Self-pay | Admitting: Surgery

## 2010-09-25 ENCOUNTER — Ambulatory Visit (INDEPENDENT_AMBULATORY_CARE_PROVIDER_SITE_OTHER): Payer: Medicare Other | Admitting: Pulmonary Disease

## 2010-09-25 ENCOUNTER — Encounter: Payer: Self-pay | Admitting: Pulmonary Disease

## 2010-09-25 VITALS — BP 120/80 | HR 67 | Temp 98.9°F | Ht 61.0 in | Wt 356.2 lb

## 2010-09-25 DIAGNOSIS — G4733 Obstructive sleep apnea (adult) (pediatric): Secondary | ICD-10-CM

## 2010-09-25 DIAGNOSIS — E662 Morbid (severe) obesity with alveolar hypoventilation: Secondary | ICD-10-CM

## 2010-09-25 NOTE — Progress Notes (Signed)
  Subjective:    Patient ID: Nina Howell, female    DOB: 1941-01-11, 70 y.o.   MRN: [id number]  HPI Primary Provider/Referring Provider: Hayden Rasmussen, NP (Dr. Kellie Shropshire)   69/F, morbidly obese for post hospital FU of obstructive sleep apnea/ OHS .  Adm 04/10/10 with worsening dyspnea & hypoxia, VQ neg, BNP 103, diuresed with lasix, diuresed, ABG 7.43/63/89/97% on 2 L, discharged on 2 L & Bilevel (empiric) ? 14/8  PSG subsequently showed moderate obstructive sleep apnea with AHi 15/h.  She was evaluated by [facility] Daphine Deutscher ) for a hepatic mass >> hemangioma  Echo july'11 >> EF 45-50%septal/ inferoseptal hypokinesis Algie Coffer)   April 28, 2010  tolerating Bipap, full face mask well, I dont have to nap, pressure OK  Was able to walk at [employer] , compliant with O2, breathing better   09/25/2010 Reviewed download dec-jan'12 >> excellent comppliance & control of event son VPAP auto Wt 381 >> 356 lbs in last 4 mnths Doing well with diet >> salads Water aerobics Compliant with BiPAP every night     Review of Systems Pt denies any significant  nasal congestion or excess secretions, fever, chills, sweats, unintended wt loss, pleuritic or exertional cp, orthopnea pnd or leg swelling.  Pt also denies any obvious fluctuation in symptoms with weather or environmental change or other alleviating or aggravating factors.    Pt denies any increase in rescue therapy over baseline, denies waking up needing it or having early am exacerbations or coughing/wheezing/ or dyspnea       Objective:   Physical Exam Gen. Pleasant, obese, in no distress ENT - no lesions, no post nasal drip Neck: No JVD, no thyromegaly, no carotid bruits Lungs: no use of accessory muscles, no dullness to percussion, clear without rales or rhonchi  Cardiovascular: Rhythm regular, heart sounds  normal, no murmurs or gallops, no peripheral edema Musculoskeletal: No deformities, no cyanosis or clubbing          Assessment  & Plan:

## 2010-09-25 NOTE — Patient Instructions (Signed)
Referral to Pulm rehab program Stay on biPAP machine every night Congratulations on your weight loss. Keep it going !

## 2010-09-25 NOTE — Assessment & Plan Note (Signed)
Ct BiPAP Weight loss encouraged, compliance with goal of at least 4-6 hrs every night is the expectation. Advised against medications with sedative side effects Cautioned against driving when sleepy - understanding that sleepiness will vary on a day to day basis

## 2010-09-25 NOTE — Assessment & Plan Note (Addendum)
referral to pulm rehab  Hopefully this will encourage & sustain wt loss.

## 2010-10-03 NOTE — Assessment & Plan Note (Signed)
OFFICE VISIT   Nina Howell, Nina Howell  DOB:  01-18-1941                                       12/30/2007  DGLOV#:[id number]   I saw the patient in the office today for continued followup of her  varicose veins.  I last saw her in March 2006.  At that time we simply  recommended compression stockings and leg elevation.  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  She has delivered 5 children and has had  varicose veins essentially all of her life.  The symptoms associated  with these varicosities are aching and pain.  She had no previous  history of phlebitis or DVT that she is aware of.  She has tried to wear  thigh-high compression stockings for over a year but because of her size  and obesity, it is very difficult to get these to fit appropriately.  She has tried different techniques including glue on the skin to help  maintain positioning of the socks, but has not been successful with  wearing compression stockings.  She does try to elevate her legs  intermittently.   PAST MEDICAL HISTORY:  Significant for adult-onset diabetes.   SOCIAL HISTORY:  She is married and has 4 living children.  She does not  use tobacco.   REVIEW OF SYSTEMS AND MEDICATIONS:  Documented on the medical history  form in her chart.   PHYSICAL EXAMINATION:  This is a pleasant 70 year old woman who appears  her stated age.  Blood pressure 160/75, heart rate is 62.  Lungs are  clear bilaterally to auscultation.  Cardiac exam, she has a regular rate  and rhythm.  Abdomen is obese and difficult to assess.  I cannot palpate  pedal pulses but both feet are warm and well perfused without ischemic  ulcers.  She does have some varicosities along the medial and lateral  aspect of both legs with no evidence of active phlebitis.  She does not  have any significant hyperpigmentation to suggest chronic venous  insufficiency.   The patient has varicose veins which are symptomatic.  However, she  would prefer to continue with attempted conservative treatment including  leg elevation and compression therapy as tolerated.  If her symptoms  progress, I have explained that she could be seen in [facility]  here and be considered for sclerotherapy or laser therapy.  She has not  had a recent duplex scan in our office.   Di Kindle. Edilia Bo, M.D.  Electronically Signed   CSD/MEDQ  D:  12/30/2007  T:  12/31/2007  Job:  [id number]   cc:   Alfonse Alpers. Dagoberto Ligas, M.D.

## 2010-10-03 NOTE — Op Note (Signed)
NAMESARAHY, CREEDON NO.:  [id number]   MEDICAL RECORD NO.:  [id number]          PATIENT TYPE:  AMB   LOCATION:  SDS                          FACILITY:  [facility]   PHYSICIAN:  Mila Homer. Sherlean Foot, M.D. DATE OF BIRTH:  05/23/1940   DATE OF PROCEDURE:  08/23/2008  DATE OF DISCHARGE:  08/23/2008                               OPERATIVE REPORT   SURGEON:  Mila Homer. Sherlean Foot, MD   ASSISTANT:  Altamese Cabal, PA-C   ANESTHESIA:  General.   PREOPERATIVE DIAGNOSES:  1. Right shoulder rotator cuff tear.  2. Acromioclavicular joint arthritis.  3. Type 2 acromion.   POSTOPERATIVE DIAGNOSES:  1. Right shoulder rotator cuff tear.  2. Acromioclavicular joint arthritis.  3. Type 2 acromion.   PROCEDURE:  Right shoulder arthroscopy, subacromial decompression,  distal clavicle resection, glenohumeral debridement, and mini-open  rotator cuff repair.   After an informed consent was obtained.   DESCRIPTION OF PROCEDURE:  The patient was laid supine, administered  general anesthesia, placed in beach-chair position.  Anterior-posterior  direct lateral portals were created with #11 blade blunt trocar and  cannula.  Diagnostic arthroscopy revealed radial tearing of the labrum.  I have debrided this to the anterior portal.  There was also more  arthritis, which I debrided as well.  I then redirected scope to the  subacromial space and posterior portal.  I then performed a subacromial  bursectomy with the [employer] shaver, I then used the ArthroCare  debridement wand and cut the undersurface of the acromion and distal  clavicle.  I have released the [facility] ligament.  I then used 4.0-mm  cylindrical bur to perform aggressive anterolateral acromioplasty and  distal clavicle resection.  I identified the rotator cuff tear, it was  about 1 cm in length at the biceps interval and not a detachment of the  supraspinatus rather a longitudinal tear.  The crura to mini-open  technique where I  extended to direct lateral portal up with a #15 blade.  I placed an Arthrex shoulder retractor in place.  I then placed 2 simple  side-to-side #2 FiberWire sutures through the rotator cuff tied that  down tightly.  I then irrigated and closed with 0 and 2-0 Vicryl  sutures, and Steri-Strips.  I then closed with Xeroform dressing sponges  near shoulder dressing.  Complications none.  Drains none.          ______________________________  Mila Homer. Sherlean Foot, M.D.    SDL/MEDQ  D:  08/23/2008  T:  08/23/2008  Job:  [id number]

## 2010-11-01 ENCOUNTER — Encounter (HOSPITAL_COMMUNITY)
Admission: RE | Admit: 2010-11-01 | Discharge: 2010-11-01 | Disposition: A | Discharge status: Home / Self Care | Payer: BC Managed Care – PPO | Source: Ambulatory Visit | Attending: Pulmonary Disease | Admitting: Pulmonary Disease

## 2010-11-01 DIAGNOSIS — E039 Hypothyroidism, unspecified: Secondary | ICD-10-CM | POA: Insufficient documentation

## 2010-11-01 DIAGNOSIS — E119 Type 2 diabetes mellitus without complications: Secondary | ICD-10-CM | POA: Insufficient documentation

## 2010-11-01 DIAGNOSIS — G4733 Obstructive sleep apnea (adult) (pediatric): Secondary | ICD-10-CM | POA: Insufficient documentation

## 2010-11-01 DIAGNOSIS — E662 Morbid (severe) obesity with alveolar hypoventilation: Secondary | ICD-10-CM | POA: Insufficient documentation

## 2010-11-01 DIAGNOSIS — R0902 Hypoxemia: Secondary | ICD-10-CM | POA: Insufficient documentation

## 2010-11-01 DIAGNOSIS — Z5189 Encounter for other specified aftercare: Secondary | ICD-10-CM | POA: Insufficient documentation

## 2010-11-07 ENCOUNTER — Encounter (HOSPITAL_COMMUNITY): Payer: BC Managed Care – PPO

## 2010-11-09 ENCOUNTER — Encounter (HOSPITAL_COMMUNITY): Payer: BC Managed Care – PPO

## 2010-11-14 ENCOUNTER — Encounter (HOSPITAL_COMMUNITY): Payer: BC Managed Care – PPO

## 2010-11-16 ENCOUNTER — Encounter (HOSPITAL_COMMUNITY): Payer: BC Managed Care – PPO

## 2010-11-21 ENCOUNTER — Encounter (HOSPITAL_COMMUNITY): Payer: BC Managed Care – PPO | Attending: Pulmonary Disease

## 2010-11-21 DIAGNOSIS — E662 Morbid (severe) obesity with alveolar hypoventilation: Secondary | ICD-10-CM | POA: Insufficient documentation

## 2010-11-21 DIAGNOSIS — R0902 Hypoxemia: Secondary | ICD-10-CM | POA: Insufficient documentation

## 2010-11-21 DIAGNOSIS — Z5189 Encounter for other specified aftercare: Secondary | ICD-10-CM | POA: Insufficient documentation

## 2010-11-21 DIAGNOSIS — G4733 Obstructive sleep apnea (adult) (pediatric): Secondary | ICD-10-CM | POA: Insufficient documentation

## 2010-11-21 DIAGNOSIS — E119 Type 2 diabetes mellitus without complications: Secondary | ICD-10-CM | POA: Insufficient documentation

## 2010-11-21 DIAGNOSIS — E039 Hypothyroidism, unspecified: Secondary | ICD-10-CM | POA: Insufficient documentation

## 2010-11-23 ENCOUNTER — Encounter (HOSPITAL_COMMUNITY): Payer: BC Managed Care – PPO

## 2010-11-28 ENCOUNTER — Encounter (HOSPITAL_COMMUNITY): Payer: BC Managed Care – PPO

## 2010-11-30 ENCOUNTER — Encounter (HOSPITAL_COMMUNITY): Payer: BC Managed Care – PPO

## 2010-12-05 ENCOUNTER — Encounter (HOSPITAL_COMMUNITY): Payer: BC Managed Care – PPO

## 2010-12-07 ENCOUNTER — Encounter (HOSPITAL_COMMUNITY): Payer: BC Managed Care – PPO

## 2010-12-12 ENCOUNTER — Encounter (HOSPITAL_COMMUNITY): Payer: BC Managed Care – PPO

## 2010-12-14 ENCOUNTER — Encounter (HOSPITAL_COMMUNITY): Payer: BC Managed Care – PPO

## 2010-12-19 ENCOUNTER — Encounter (HOSPITAL_COMMUNITY): Payer: BC Managed Care – PPO

## 2010-12-21 ENCOUNTER — Encounter (HOSPITAL_COMMUNITY): Payer: BC Managed Care – PPO | Attending: Pulmonary Disease

## 2010-12-21 DIAGNOSIS — E119 Type 2 diabetes mellitus without complications: Secondary | ICD-10-CM | POA: Insufficient documentation

## 2010-12-21 DIAGNOSIS — E662 Morbid (severe) obesity with alveolar hypoventilation: Secondary | ICD-10-CM | POA: Insufficient documentation

## 2010-12-21 DIAGNOSIS — E039 Hypothyroidism, unspecified: Secondary | ICD-10-CM | POA: Insufficient documentation

## 2010-12-21 DIAGNOSIS — Z5189 Encounter for other specified aftercare: Secondary | ICD-10-CM | POA: Insufficient documentation

## 2010-12-21 DIAGNOSIS — R0902 Hypoxemia: Secondary | ICD-10-CM | POA: Insufficient documentation

## 2010-12-21 DIAGNOSIS — G4733 Obstructive sleep apnea (adult) (pediatric): Secondary | ICD-10-CM | POA: Insufficient documentation

## 2010-12-26 ENCOUNTER — Encounter (HOSPITAL_COMMUNITY): Payer: BC Managed Care – PPO

## 2010-12-28 ENCOUNTER — Encounter (HOSPITAL_COMMUNITY): Payer: BC Managed Care – PPO

## 2011-01-01 ENCOUNTER — Telehealth: Payer: Self-pay | Admitting: Pulmonary Disease

## 2011-01-01 NOTE — Telephone Encounter (Signed)
OK to give this?

## 2011-01-01 NOTE — Telephone Encounter (Signed)
Pt's son is incarcerated and the prison is requesting that the pt bring a letter from her doctor stating that she needs to have portable oxygen so she may enter the prison with it on. This needs to be on letterhead. Pls advise.

## 2011-01-02 ENCOUNTER — Encounter (HOSPITAL_COMMUNITY): Payer: BC Managed Care – PPO

## 2011-01-03 ENCOUNTER — Encounter: Payer: Self-pay | Admitting: *Deleted

## 2011-01-03 NOTE — Telephone Encounter (Signed)
Letter placed at front desk for pick lmom of same and to call if needed.

## 2011-01-04 ENCOUNTER — Encounter (HOSPITAL_COMMUNITY): Payer: BC Managed Care – PPO

## 2011-01-09 ENCOUNTER — Encounter (HOSPITAL_COMMUNITY): Payer: BC Managed Care – PPO

## 2011-01-11 ENCOUNTER — Encounter (HOSPITAL_COMMUNITY): Payer: BC Managed Care – PPO

## 2011-01-16 ENCOUNTER — Encounter (HOSPITAL_COMMUNITY): Payer: BC Managed Care – PPO

## 2011-01-18 ENCOUNTER — Encounter (HOSPITAL_COMMUNITY): Payer: BC Managed Care – PPO

## 2011-01-23 ENCOUNTER — Encounter (HOSPITAL_COMMUNITY): Payer: BC Managed Care – PPO | Attending: Pulmonary Disease

## 2011-01-23 DIAGNOSIS — E662 Morbid (severe) obesity with alveolar hypoventilation: Secondary | ICD-10-CM | POA: Insufficient documentation

## 2011-01-23 DIAGNOSIS — G4733 Obstructive sleep apnea (adult) (pediatric): Secondary | ICD-10-CM | POA: Insufficient documentation

## 2011-01-23 DIAGNOSIS — R0902 Hypoxemia: Secondary | ICD-10-CM | POA: Insufficient documentation

## 2011-01-23 DIAGNOSIS — E119 Type 2 diabetes mellitus without complications: Secondary | ICD-10-CM | POA: Insufficient documentation

## 2011-01-23 DIAGNOSIS — E039 Hypothyroidism, unspecified: Secondary | ICD-10-CM | POA: Insufficient documentation

## 2011-01-23 DIAGNOSIS — Z5189 Encounter for other specified aftercare: Secondary | ICD-10-CM | POA: Insufficient documentation

## 2011-01-25 ENCOUNTER — Encounter (HOSPITAL_COMMUNITY): Payer: BC Managed Care – PPO

## 2011-01-30 ENCOUNTER — Encounter (HOSPITAL_COMMUNITY): Payer: BC Managed Care – PPO

## 2011-02-01 ENCOUNTER — Encounter (HOSPITAL_COMMUNITY): Payer: BC Managed Care – PPO

## 2011-02-06 ENCOUNTER — Encounter (HOSPITAL_COMMUNITY): Payer: BC Managed Care – PPO

## 2011-02-08 ENCOUNTER — Encounter (HOSPITAL_COMMUNITY): Payer: BC Managed Care – PPO

## 2011-02-13 ENCOUNTER — Encounter (HOSPITAL_COMMUNITY): Payer: BC Managed Care – PPO

## 2011-02-15 ENCOUNTER — Encounter (HOSPITAL_COMMUNITY): Payer: BC Managed Care – PPO

## 2011-02-20 ENCOUNTER — Encounter (HOSPITAL_COMMUNITY)
Admission: RE | Admit: 2011-02-20 | Discharge: 2011-02-20 | Disposition: A | Discharge status: Home / Self Care | Payer: Self-pay | Source: Ambulatory Visit | Attending: Pulmonary Disease | Admitting: Pulmonary Disease

## 2011-02-20 DIAGNOSIS — E662 Morbid (severe) obesity with alveolar hypoventilation: Secondary | ICD-10-CM | POA: Insufficient documentation

## 2011-02-20 DIAGNOSIS — G4733 Obstructive sleep apnea (adult) (pediatric): Secondary | ICD-10-CM | POA: Insufficient documentation

## 2011-02-20 DIAGNOSIS — R0902 Hypoxemia: Secondary | ICD-10-CM | POA: Insufficient documentation

## 2011-02-20 DIAGNOSIS — Z5189 Encounter for other specified aftercare: Secondary | ICD-10-CM | POA: Insufficient documentation

## 2011-02-20 DIAGNOSIS — E039 Hypothyroidism, unspecified: Secondary | ICD-10-CM | POA: Insufficient documentation

## 2011-02-20 DIAGNOSIS — E119 Type 2 diabetes mellitus without complications: Secondary | ICD-10-CM | POA: Insufficient documentation

## 2011-02-22 ENCOUNTER — Encounter (HOSPITAL_COMMUNITY): Payer: Self-pay

## 2011-02-27 ENCOUNTER — Encounter (HOSPITAL_COMMUNITY): Payer: Self-pay

## 2011-03-01 ENCOUNTER — Encounter (HOSPITAL_COMMUNITY): Payer: Self-pay

## 2011-03-06 ENCOUNTER — Encounter (HOSPITAL_COMMUNITY): Payer: Self-pay

## 2011-03-08 ENCOUNTER — Encounter (HOSPITAL_COMMUNITY): Payer: Self-pay

## 2011-03-13 ENCOUNTER — Encounter (HOSPITAL_COMMUNITY): Payer: Self-pay

## 2011-03-15 ENCOUNTER — Encounter (HOSPITAL_COMMUNITY): Payer: Self-pay

## 2011-03-20 ENCOUNTER — Encounter (HOSPITAL_COMMUNITY): Payer: Self-pay

## 2011-03-22 ENCOUNTER — Encounter (HOSPITAL_COMMUNITY): Payer: Self-pay

## 2011-03-22 DIAGNOSIS — E662 Morbid (severe) obesity with alveolar hypoventilation: Secondary | ICD-10-CM | POA: Insufficient documentation

## 2011-03-22 DIAGNOSIS — R0902 Hypoxemia: Secondary | ICD-10-CM | POA: Insufficient documentation

## 2011-03-22 DIAGNOSIS — E119 Type 2 diabetes mellitus without complications: Secondary | ICD-10-CM | POA: Insufficient documentation

## 2011-03-22 DIAGNOSIS — E039 Hypothyroidism, unspecified: Secondary | ICD-10-CM | POA: Insufficient documentation

## 2011-03-22 DIAGNOSIS — G4733 Obstructive sleep apnea (adult) (pediatric): Secondary | ICD-10-CM | POA: Insufficient documentation

## 2011-03-22 DIAGNOSIS — Z5189 Encounter for other specified aftercare: Secondary | ICD-10-CM | POA: Insufficient documentation

## 2011-03-23 ENCOUNTER — Ambulatory Visit (INDEPENDENT_AMBULATORY_CARE_PROVIDER_SITE_OTHER): Payer: BC Managed Care – PPO | Admitting: General Surgery

## 2011-03-23 VITALS — BP 142/70 | HR 45 | Temp 97.5°F | Resp 16 | Ht 61.0 in | Wt 322.0 lb

## 2011-03-23 DIAGNOSIS — D1803 Hemangioma of intra-abdominal structures: Secondary | ICD-10-CM

## 2011-03-23 NOTE — Progress Notes (Signed)
Chief Complaint  Patient presents with  . Other    liver mass    HISTORY: Patient is a 70 year old female who is with a left liver hemangioma. I saw her in March of this year and last fall. She has heart failure and COPD. She is on CPAP. Since I saw her last she has lost 100 pounds with cardiopulmonary rehabilitation. She denies any abdominal pain nausea or vomiting. She does have issues with constipation and has difficulty having stools without stool softeners.  Denies fevers or chills. She overall felt much better  Past Medical History  Diagnosis Date  . Cellulitis     ABDOMINAL WALL  . CHF (congestive heart failure)     DECOMPENSATED SYSTOLIC CHF  . Hypoventilation   . Hypokalemia   . CHF (congestive heart failure)   . Diabetes mellitus, type 2   . Hypothyroidism     Past Surgical History  Procedure Date  . Shoulder surgery 2009    RIGHT SHOULDER    Current Outpatient Prescriptions  Medication Sig Dispense Refill  . acetaminophen (TYLENOL) 500 MG tablet Take 500 mg by mouth every 6 (six) hours as needed.        Marland Kitchen amLODipine (NORVASC) 5 MG tablet Take 5 mg by mouth daily.        Marland Kitchen levothyroxine (SYNTHROID, LEVOTHROID) 50 MCG tablet Take 50 mcg by mouth daily.        . Multiple Vitamin (MULTIVITAMIN) capsule Take 1 capsule by mouth daily.        . sertraline (ZOLOFT) 50 MG tablet Take 50 mg by mouth daily.        . sitaGLIPtan-metformin (JANUMET) 50-1000 MG per tablet Take 1 tablet by mouth 2 (two) times daily with a meal.        . tolterodine (DETROL LA) 4 MG 24 hr capsule Take 4 mg by mouth daily as needed.       Jennette Banker Sodium 30-100 MG CAPS Take 1 tablet by mouth daily.        . Cholecalciferol (VITAMIN D3) 1000 UNITS CAPS Take 1 tablet by mouth daily.        Marland Kitchen CRANBERRY PO Take by mouth.        . cyanocobalamin 1000 MCG tablet 2500 mcg daily      . furosemide (LASIX) 40 MG tablet Take 40 mg by mouth 3 (three) times daily.       . Insulin Isophane &  Regular (HUMULIN 70/30 [place]) Inject 20 Units into the skin 2 (two) times daily.        Marland Kitchen oxycodone (OXY-IR) 5 MG capsule Take 5 mg by mouth every 4 (four) hours as needed.        . potassium chloride SA (K-DUR,KLOR-CON) 20 MEQ tablet Take 20 mEq by mouth 3 (three) times daily.        Marland Kitchen sulfamethoxazole-trimethoprim (BACTRIM DS) 800-160 MG per tablet Take 1 tablet by mouth 2 (two) times daily.        Marland Kitchen triamterene-hydrochlorothiazide (MAXZIDE) 75-50 MG per tablet Take 1 tablet by mouth daily.           Allergies  Allergen Reactions  . Cephalexin     REACTION: hives  . Cephalosporins     REACTION: rash     Family History  Problem Relation Age of Onset  . Cancer Mother     throat  . Diabetes Father   . Leukemia Father      History   Social History  .  Marital Status: Married    Spouse Name: N/A    Number of Children: 4  . Years of Education: N/A   Occupational History  . retired     Child psychotherapist   Social History Main Topics  . Smoking status: Never Smoker   . Smokeless tobacco: Never Used  . Alcohol Use: No  . Drug Use: Not on file  . Sexually Active: Not on file   Other Topics Concern  . Not on file   Social History Narrative  . No narrative on file     REVIEW OF SYSTEMS - PERTINENT POSITIVES ONLY: 12 point review of systems negative other than HPI and PMH except for occasional vaginal bleeding, arthritis pain.    EXAM: Filed Vitals:   03/23/11 1134  BP: 142/70  Pulse: 45  Temp: 97.5 F (36.4 C)  Resp: 16    Gen:  No acute distress.  Well nourished and well groomed.  Obese.  Has lost 100 pounds.   Neurological: Alert and oriented to person, place, and time. Coordination normal.  Head: Normocephalic and atraumatic.  Eyes: Conjunctivae are normal. Pupils are equal, round, and reactive to light. No scleral icterus.  Neck: Normal range of motion. Neck supple. No tracheal deviation or thyromegaly present.  Cardiovascular: Normal rate. Respiratory: Effort  normal.  No respiratory distress.  GI: The abdomen is soft and nontender.  There is no rebound and no guarding.  Lymphadenopathy: No cervical, preauricular, postauricular or axillary adenopathy is present Skin: Skin is warm and dry. No rash noted. No diaphoresis. No erythema. No pallor. No clubbing, cyanosis, or edema.   Psychiatric: Normal mood and affect. Behavior is normal. Judgment and thought content normal.    RADIOLOGY RESULTS: See E-Chart or I-Site for most recent results.  Images and reports are reviewed.  ASSESSMENT AND PLAN: Hemangioma of liver, left side Doing well.   Asymptomatic from abdominal standpoint.   Follow up size of mass. Will try ultrasound due to pt anxiety. If unable to see due to size, will order MR or CT. Follow up in 6 months.        Maudry Diego MD Surgical Oncology, General and Endocrine Surgery [facility]      Visit Diagnoses: 1. Hemangioma of liver, left side     Primary Care Physician: Hayden Rasmussen, NP, NP

## 2011-03-23 NOTE — Assessment & Plan Note (Signed)
Doing well.   Asymptomatic from abdominal standpoint.   Follow up size of mass. Will try ultrasound due to pt anxiety. If unable to see due to size, will order MR or CT. Follow up in 6 months.

## 2011-03-23 NOTE — Patient Instructions (Signed)
Will see if ultrasound can see size of mass.  Need to compare to last MRI. No need for surgery at this time unless rapid growth is seen.    Follow up with GYN as needed.

## 2011-03-27 ENCOUNTER — Encounter (HOSPITAL_COMMUNITY): Payer: Self-pay

## 2011-03-29 ENCOUNTER — Encounter (HOSPITAL_COMMUNITY): Payer: Self-pay

## 2011-03-30 ENCOUNTER — Ambulatory Visit
Admission: RE | Admit: 2011-03-30 | Discharge: 2011-03-30 | Disposition: A | Discharge status: Home / Self Care | Payer: BC Managed Care – PPO | Source: Ambulatory Visit | Attending: General Surgery | Admitting: General Surgery

## 2011-03-30 DIAGNOSIS — D1803 Hemangioma of intra-abdominal structures: Secondary | ICD-10-CM

## 2011-04-03 ENCOUNTER — Encounter (HOSPITAL_COMMUNITY): Payer: Self-pay

## 2011-04-05 ENCOUNTER — Encounter (HOSPITAL_COMMUNITY)
Admission: RE | Admit: 2011-04-05 | Discharge: 2011-04-05 | Disposition: A | Discharge status: Home / Self Care | Payer: BC Managed Care – PPO | Source: Ambulatory Visit | Attending: Pulmonary Disease | Admitting: Pulmonary Disease

## 2011-04-09 ENCOUNTER — Ambulatory Visit (INDEPENDENT_AMBULATORY_CARE_PROVIDER_SITE_OTHER): Payer: BC Managed Care – PPO | Admitting: Pulmonary Disease

## 2011-04-09 ENCOUNTER — Encounter: Payer: Self-pay | Admitting: Pulmonary Disease

## 2011-04-09 DIAGNOSIS — G4733 Obstructive sleep apnea (adult) (pediatric): Secondary | ICD-10-CM

## 2011-04-09 NOTE — Progress Notes (Signed)
  Subjective:    Patient ID: Nina Howell, female    DOB: 1940/12/21, 70 y.o.   MRN: [id number]  HPI Primary Provider/Referring Provider: Hayden Rasmussen, NP (Dr. Kellie Shropshire)   70/F, morbidly obese for FU of obstructive sleep apnea/ OHS .  Adm 04/10/10 with worsening dyspnea & hypoxia, VQ neg, BNP 103, diuresed, ABG 7.43/63/89/97% on 2 L, discharged on 2 L & Bilevel (empiric) ? 14/8  PSG subsequently showed moderate obstructive sleep apnea with AHi 15/h.  She was evaluated by [facility] Daphine Deutscher ) for a hepatic mass >> hemangioma  Echo july'11 >> EF 45-50%septal/ inferoseptal hypokinesis Algie Coffer)   Download dec-jan'12 >> excellent compliance & control of events on VPAP auto   04/09/2011 Lost 100 lbs in 1 yr !  Is able to work on stepper, wants to increase to resistance machines Doing well with diet >> salads  Water aerobics  Compliant with BiPAP every night    Review of Systems Patient denies significant dyspnea,cough, hemoptysis,  chest pain, palpitations, pedal edema, orthopnea, paroxysmal nocturnal dyspnea, lightheadedness, nausea, vomiting, abdominal or  leg pains      Objective:   Physical Exam  Gen. Pleasant, obese, in no distress ENT - no lesions, no post nasal drip, class 2 airway Neck: No JVD, no thyromegaly, no carotid bruits Lungs: no use of accessory muscles, no dullness to percussion, decreased without rales or rhonchi  Cardiovascular: Rhythm regular, heart sounds  normal, no murmurs or gallops, no peripheral edema Musculoskeletal: No deformities, no cyanosis or clubbing , no tremors       Assessment & Plan:    OBSTRUCTIVE SLEEP APNEA On VPAP auto with good control of events on download jan'12 Weight loss encouraged, compliance with goal of at least 4-6 hrs every night is the expectation. Advised against medications with sedative side effects Cautioned against driving when sleepy - understanding that sleepiness will vary on a day to day basis\

## 2011-04-09 NOTE — Patient Instructions (Signed)
OK to graduate to resistance machines Try off oxygen during exercise, If Ok x 1 mnth, call us & we ill discontinue Congratulations on your weight loss !! Keep up the good work. OK to resume water aerobics off oxygen

## 2011-04-10 ENCOUNTER — Encounter (HOSPITAL_COMMUNITY)
Admission: RE | Admit: 2011-04-10 | Discharge: 2011-04-10 | Disposition: A | Discharge status: Home / Self Care | Payer: Self-pay | Source: Ambulatory Visit | Attending: Pulmonary Disease | Admitting: Pulmonary Disease

## 2011-04-11 NOTE — Assessment & Plan Note (Signed)
Has done remarkably well by losing wt & is motivated to lose more.

## 2011-04-11 NOTE — Assessment & Plan Note (Addendum)
On VPAP auto with good control of events on download jan'12 Weight loss encouraged, compliance with goal of at least 4-6 hrs every night is the expectation. Advised against medications with sedative side effects Cautioned against driving when sleepy - understanding that sleepiness will vary on a day to day basis\ Try off oxygen during exercise for longer periods

## 2011-04-12 ENCOUNTER — Encounter (HOSPITAL_COMMUNITY): Payer: Self-pay

## 2011-04-13 NOTE — Progress Notes (Signed)
Quick Note:  Please tell patient that liver mass is smaller than last year! This is good. Will get one more follow up u/s (please order) next year, then if smaller, will wait 3 years.  ______

## 2011-04-16 ENCOUNTER — Telehealth: Payer: Self-pay | Admitting: *Deleted

## 2011-04-16 NOTE — Telephone Encounter (Signed)
I was asked by our scheduler to call the patient and see what kind of visit she needed. Nina Howell is being seen by numerous MD's and she hasn't been seen by [facility] since 2009. Her drs. Wanted her to see Nina Howell again to get a baseline of her venous/arterial status. She is not interested in having her varicose veins treated, therefore I did not schedule her to have a duplex study. She agreed that all she wants is for Nina Howell to "take a look at her", therefore I did not order ABI's either. I will show this note to Nina Howell so he is prepared for her visit and if he feels she does need studies, we can make those changes.

## 2011-04-17 ENCOUNTER — Encounter (HOSPITAL_COMMUNITY)
Admission: RE | Admit: 2011-04-17 | Discharge: 2011-04-17 | Disposition: A | Discharge status: Home / Self Care | Payer: Self-pay | Source: Ambulatory Visit | Attending: Pulmonary Disease | Admitting: Pulmonary Disease

## 2011-04-19 ENCOUNTER — Encounter (HOSPITAL_COMMUNITY)
Admission: RE | Admit: 2011-04-19 | Discharge: 2011-04-19 | Disposition: A | Discharge status: Home / Self Care | Payer: Self-pay | Source: Ambulatory Visit | Attending: Pulmonary Disease | Admitting: Pulmonary Disease

## 2011-04-24 ENCOUNTER — Encounter (HOSPITAL_COMMUNITY)
Admission: RE | Admit: 2011-04-24 | Discharge: 2011-04-24 | Disposition: A | Discharge status: Home / Self Care | Payer: BC Managed Care – PPO | Source: Ambulatory Visit | Attending: Pulmonary Disease | Admitting: Pulmonary Disease

## 2011-04-24 ENCOUNTER — Encounter: Payer: Self-pay | Admitting: Vascular Surgery

## 2011-04-24 DIAGNOSIS — E039 Hypothyroidism, unspecified: Secondary | ICD-10-CM | POA: Insufficient documentation

## 2011-04-24 DIAGNOSIS — E662 Morbid (severe) obesity with alveolar hypoventilation: Secondary | ICD-10-CM | POA: Insufficient documentation

## 2011-04-24 DIAGNOSIS — R0902 Hypoxemia: Secondary | ICD-10-CM | POA: Insufficient documentation

## 2011-04-24 DIAGNOSIS — G4733 Obstructive sleep apnea (adult) (pediatric): Secondary | ICD-10-CM | POA: Insufficient documentation

## 2011-04-24 DIAGNOSIS — Z5189 Encounter for other specified aftercare: Secondary | ICD-10-CM | POA: Insufficient documentation

## 2011-04-24 DIAGNOSIS — E119 Type 2 diabetes mellitus without complications: Secondary | ICD-10-CM | POA: Insufficient documentation

## 2011-04-25 ENCOUNTER — Ambulatory Visit (INDEPENDENT_AMBULATORY_CARE_PROVIDER_SITE_OTHER): Payer: BC Managed Care – PPO | Admitting: Vascular Surgery

## 2011-04-25 ENCOUNTER — Encounter: Payer: Self-pay | Admitting: Vascular Surgery

## 2011-04-25 VITALS — BP 115/75 | HR 65 | Resp 20 | Ht 61.0 in | Wt 320.0 lb

## 2011-04-25 DIAGNOSIS — I8390 Asymptomatic varicose veins of unspecified lower extremity: Secondary | ICD-10-CM | POA: Insufficient documentation

## 2011-04-25 DIAGNOSIS — I872 Venous insufficiency (chronic) (peripheral): Secondary | ICD-10-CM | POA: Insufficient documentation

## 2011-04-25 NOTE — Progress Notes (Signed)
[facility]  Patient name: Nina Howell MRN: [id number] DOB: 08-10-1940 Sex: female  CC: Follow up of mild peripheral vascular disease and keratosis veins  HPI: Nina Howell is a 70 y.o. female who I last saw in August of 2009. She has become quite motivated to take good care of herself and has lost 103 pounds. She wished to have her arterial and venous evaluation. She has not described any history of claudication or rest pain. She does have some neuropathy in her feet which she describes as occasional paresthesias. There are no aggravating or alleviating factors associated with this. She does describes an aching pain in her legs with prolonged standing related to her varicose veins. She's had no phlebitis and no bleeding problems.  Past Medical History  Diagnosis Date  . Cellulitis     ABDOMINAL WALL  . CHF (congestive heart failure)     DECOMPENSATED SYSTOLIC CHF  . Hypoventilation   . Hypokalemia   . CHF (congestive heart failure)   . Diabetes mellitus, type 2   . Hypothyroidism   . Varicose veins   . Morbid obesity     Family History  Problem Relation Age of Onset  . Cancer Mother     throat  . Diabetes Father   . Leukemia Father     SOCIAL HISTORY: History  Substance Use Topics  . Smoking status: Never Smoker   . Smokeless tobacco: Never Used  . Alcohol Use: No    Allergies  Allergen Reactions  . Cephalexin     REACTION: hives  . Cephalosporins     REACTION: rash    Current Outpatient Prescriptions  Medication Sig Dispense Refill  . acetaminophen (TYLENOL) 500 MG tablet Take 500 mg by mouth every 6 (six) hours as needed.        Marland Kitchen amLODipine (NORVASC) 5 MG tablet Take 5 mg by mouth daily.        Jennette Banker Sodium 30-100 MG CAPS Take 1 tablet by mouth daily.        . Cholecalciferol (VITAMIN D3) 1000 UNITS CAPS Take 1 tablet by mouth daily.        . clobetasol ointment (TEMOVATE) 0.05 % Apply 1 application  topically 2 (two) times daily.        Marland Kitchen CRANBERRY PO Take by mouth.        . cyanocobalamin 1000 MCG tablet 2500 mcg daily      . fluocinonide (LIDEX) 0.05 % external solution Apply 1 application topically 2 (two) times daily.        . furosemide (LASIX) 40 MG tablet Take 40 mg by mouth 3 (three) times daily.       . insulin aspart protamine-insulin aspart (NOVOLOG 70/30) (70-30) 100 UNIT/ML injection Inject into the skin.        Marland Kitchen levothyroxine (SYNTHROID, LEVOTHROID) 50 MCG tablet Take 50 mcg by mouth daily.        . Multiple Vitamin (MULTIVITAMIN) capsule Take 1 capsule by mouth daily.        Marland Kitchen oxycodone (OXY-IR) 5 MG capsule Take 5 mg by mouth every 4 (four) hours as needed.        . potassium chloride SA (K-DUR,KLOR-CON) 20 MEQ tablet Take 20 mEq by mouth 3 (three) times daily.        . sertraline (ZOLOFT) 50 MG tablet Take 50 mg by mouth daily.        . sitaGLIPtan-metformin (JANUMET) 50-1000 MG per tablet Take 1  tablet by mouth 2 (two) times daily with a meal.        . tolterodine (DETROL LA) 4 MG 24 hr capsule Take 4 mg by mouth daily as needed.       . Insulin Isophane & Regular (HUMULIN 70/30 [place]) Inject 15 Units into the skin at bedtime.       . triamterene-hydrochlorothiazide (MAXZIDE) 75-50 MG per tablet Take 1 tablet by mouth daily.          REVIEW OF SYSTEMS: Arly.Keller ] denotes positive finding; [  ] denotes negative finding CARDIOVASCULAR:  [ ]  chest pain   [ ]  chest pressure   [ ]  palpitations   [ ]  orthopnea   [ ]  dyspnea on exertion   [ ]  claudication   [ ]  rest pain   [ ]  DVT   [ ]  phlebitis PULMONARY:   [ ]  productive cough   [ ]  asthma   [ ]  wheezing NEUROLOGIC:   [ ]  weakness  [ ]  paresthesias  [ ]  aphasia  [ ]  amaurosis  [ ]  dizziness HEMATOLOGIC:   [ ]  bleeding problems   [ ]  clotting disorders MUSCULOSKELETAL:  [ ]  joint pain   [ ]  joint swelling [ ]  leg swelling GASTROINTESTINAL: [ ]   blood in stool  [ ]   hematemesis GENITOURINARY:  [ ]   dysuria  [ ]    hematuria PSYCHIATRIC:  [ ]  history of major depression INTEGUMENTARY:  [ ]  rashes  [ ]  ulcers Arly.Keller ] varicose veins CONSTITUTIONAL:  [ ]  fever   [ ]  chills  PHYSICAL EXAM: Filed Vitals:   04/25/11 0857  BP: 115/75  Pulse: 65  Resp: 20  Height: 5\' 1"  (1.549 m)  Weight: 320 lb (145.151 kg)  SpO2: 97%   Body mass index is 60.46 kg/(m^2). GENERAL: The patient is a well-nourished female, in no acute distress. The vital signs are documented above. CARDIOVASCULAR: There is a regular rate and rhythm without significant murmur appreciated. I do not detect any carotid bruits. I cannot palpate femoral or popliteal pulses because of her size. She does have palpable dorsalis pedis pulses. Both feet are warm and well-perfused. PULMONARY: There is good air exchange bilaterally without wheezing or rales. ABDOMEN: Soft and non-tender with normal pitched bowel sounds. I am unable to assess for an abdominal aortic aneurysm because of her size. MUSCULOSKELETAL: There are no major deformities or cyanosis. NEUROLOGIC: No focal weakness or paresthesias are detected. SKIN: There are no ulcers or rashes noted. She has varicose veins and telangiectasias bilaterally but no evidence of phlebitis. PSYCHIATRIC: The patient has a normal affect.  MEDICAL ISSUES: I reassured her that she has excellent arterial flow. Her varicose veins are not problematic at this point. He is highly motivated to continue to lose weight. At this point she does not wish to consider any treatment for varicose veins which I think is best. I'll see her back in 18 months. She knows to call sooner if she has problems.  DICKSON,CHRISTOPHER S [facility] [place] Office: [phone]

## 2011-04-26 ENCOUNTER — Encounter (HOSPITAL_COMMUNITY)
Admission: RE | Admit: 2011-04-26 | Discharge: 2011-04-26 | Disposition: A | Discharge status: Home / Self Care | Payer: Self-pay | Source: Ambulatory Visit | Attending: Pulmonary Disease | Admitting: Pulmonary Disease

## 2011-04-27 ENCOUNTER — Other Ambulatory Visit (INDEPENDENT_AMBULATORY_CARE_PROVIDER_SITE_OTHER): Payer: Self-pay | Admitting: General Surgery

## 2011-04-27 DIAGNOSIS — D1803 Hemangioma of intra-abdominal structures: Secondary | ICD-10-CM

## 2011-05-01 ENCOUNTER — Other Ambulatory Visit (INDEPENDENT_AMBULATORY_CARE_PROVIDER_SITE_OTHER): Payer: Self-pay | Admitting: General Surgery

## 2011-05-01 ENCOUNTER — Telehealth (INDEPENDENT_AMBULATORY_CARE_PROVIDER_SITE_OTHER): Payer: Self-pay

## 2011-05-01 ENCOUNTER — Encounter (HOSPITAL_COMMUNITY)
Admission: RE | Admit: 2011-05-01 | Discharge: 2011-05-01 | Disposition: A | Discharge status: Home / Self Care | Payer: Self-pay | Source: Ambulatory Visit | Attending: Pulmonary Disease | Admitting: Pulmonary Disease

## 2011-05-01 DIAGNOSIS — R160 Hepatomegaly, not elsewhere classified: Secondary | ICD-10-CM

## 2011-05-01 NOTE — Telephone Encounter (Signed)
Called and LMOM that pt's last US showed the mass was smaller in size.  We will order a f/u US this time next year, and if that one shows improvement will order another [place] in 3 years.

## 2011-05-03 ENCOUNTER — Encounter (HOSPITAL_COMMUNITY)
Admission: RE | Admit: 2011-05-03 | Discharge: 2011-05-03 | Disposition: A | Discharge status: Home / Self Care | Payer: Self-pay | Source: Ambulatory Visit | Attending: Pulmonary Disease | Admitting: Pulmonary Disease

## 2011-05-07 ENCOUNTER — Other Ambulatory Visit (HOSPITAL_COMMUNITY): Payer: BC Managed Care – PPO

## 2011-05-08 ENCOUNTER — Encounter (HOSPITAL_COMMUNITY)
Admission: RE | Admit: 2011-05-08 | Discharge: 2011-05-08 | Disposition: A | Discharge status: Home / Self Care | Payer: BC Managed Care – PPO | Source: Ambulatory Visit | Attending: Pulmonary Disease | Admitting: Pulmonary Disease

## 2011-05-09 ENCOUNTER — Ambulatory Visit: Payer: BC Managed Care – PPO | Admitting: Vascular Surgery

## 2011-05-09 ENCOUNTER — Telehealth: Payer: Self-pay | Admitting: Pulmonary Disease

## 2011-05-09 DIAGNOSIS — E662 Morbid (severe) obesity with alveolar hypoventilation: Secondary | ICD-10-CM

## 2011-05-09 DIAGNOSIS — G4733 Obstructive sleep apnea (adult) (pediatric): Secondary | ICD-10-CM

## 2011-05-09 NOTE — Telephone Encounter (Signed)
Left message for patient that we have placed order for D/C O2 through [facility] per RA.

## 2011-05-09 NOTE — Telephone Encounter (Signed)
RA, do you recall this and if so please place order for patient. Thanks.   Pt is aware that RA is not back in office until 05-10-2011 via message on answering machine.

## 2011-05-09 NOTE — Telephone Encounter (Signed)
Ok to dc O2 

## 2011-05-10 ENCOUNTER — Encounter (HOSPITAL_COMMUNITY)
Admission: RE | Admit: 2011-05-10 | Discharge: 2011-05-10 | Disposition: A | Discharge status: Home / Self Care | Payer: Self-pay | Source: Ambulatory Visit | Attending: Pulmonary Disease | Admitting: Pulmonary Disease

## 2011-05-15 ENCOUNTER — Encounter (HOSPITAL_COMMUNITY): Payer: BC Managed Care – PPO

## 2011-05-17 ENCOUNTER — Encounter (HOSPITAL_COMMUNITY): Payer: BC Managed Care – PPO

## 2011-05-22 ENCOUNTER — Encounter (HOSPITAL_COMMUNITY): Payer: Self-pay

## 2011-05-22 DIAGNOSIS — E662 Morbid (severe) obesity with alveolar hypoventilation: Secondary | ICD-10-CM | POA: Insufficient documentation

## 2011-05-22 DIAGNOSIS — R0902 Hypoxemia: Secondary | ICD-10-CM | POA: Insufficient documentation

## 2011-05-22 DIAGNOSIS — E039 Hypothyroidism, unspecified: Secondary | ICD-10-CM | POA: Insufficient documentation

## 2011-05-22 DIAGNOSIS — Z5189 Encounter for other specified aftercare: Secondary | ICD-10-CM | POA: Insufficient documentation

## 2011-05-22 DIAGNOSIS — E119 Type 2 diabetes mellitus without complications: Secondary | ICD-10-CM | POA: Insufficient documentation

## 2011-05-22 DIAGNOSIS — G4733 Obstructive sleep apnea (adult) (pediatric): Secondary | ICD-10-CM | POA: Insufficient documentation

## 2011-05-24 ENCOUNTER — Encounter (HOSPITAL_COMMUNITY)
Admission: RE | Admit: 2011-05-24 | Discharge: 2011-05-24 | Disposition: A | Discharge status: Home / Self Care | Payer: Self-pay | Source: Ambulatory Visit | Attending: Pulmonary Disease | Admitting: Pulmonary Disease

## 2011-05-24 IMAGING — CR DG CHEST 1V PORT
1 series · 1 of 1 positions shown · non-contrast
Comparison: 03/23/2010

CLINICAL DATA: Dizziness/weakness

PORTABLE CHEST - 1 VIEW

[AP]
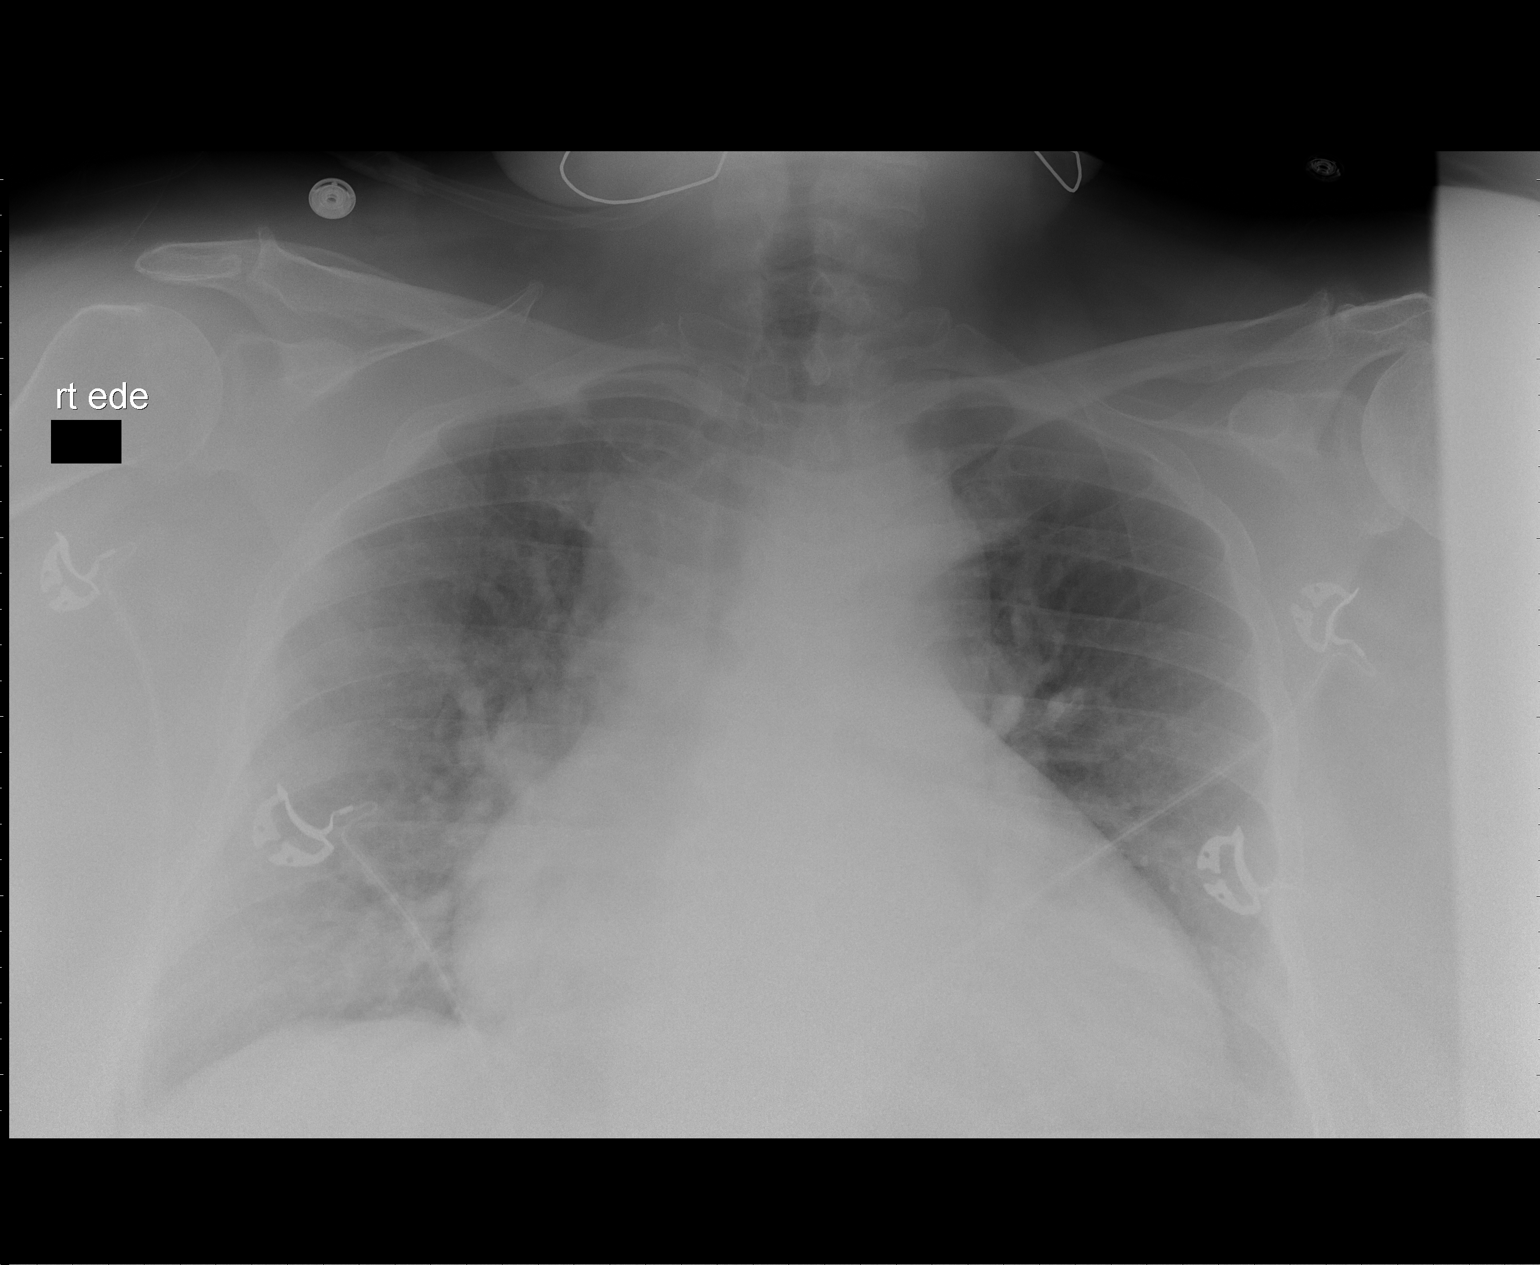

[1 of 1 positions shown; findings below may reference images not displayed]

FINDINGS: Heart enlarged.  Mild venous hypertension without frank
congestive heart failure.  Lungs clear in one-view.
IMPRESSION: Cardiomegaly with venous hypertension - no frank congestive heart
failure or pneumonia in one-view.

## 2011-05-24 IMAGING — NM NM PULM PERFUSION & VENT (REBREATHING & WASHOUT)
1 series · 6 of 6 positions shown · non-contrast
Comparison: None
Correlation:  Chest radiograph 04/10/2010

CLINICAL DATA: Respiratory failure, question pulmonary embolism;

NUCLEAR MEDICINE VENTILATION - PERFUSION LUNG SCAN
TECHNIQUE: Wash-in, equilibrium, and wash-out phase ventilation
images were obtained using Fe-V88 gas.  Perfusion images were
obtained in multiple projections after intravenous injection of Tc-
99m MAA.
Radiopharmaceuticals:  10.75 mCi Fe-V88 gas and 6.6 mCi Rc-ZZm MAA.

[vq lung vent/perf · 2.71mm/px · 6 of 18 frames shown]
[frame 2/18]
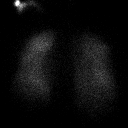
[frame 5/18]
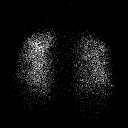
[frame 8/18]
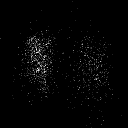
[frame 11/18]
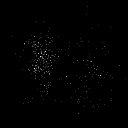
[frame 14/18]
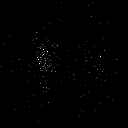
[frame 17/18]
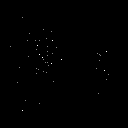

[6 of 6 positions shown; findings below may reference images not displayed]

FINDINGS: Due to patient weight, examination performed on a single head gamma
camera.  Ventilation in posterior projections shows minimal xenon
retention in mid-to-lower left lung.
Perfusion lung scan in eight projections shows cardiomegaly and
slight prominent hila.
No segmental or subsegmental perfusion defects identified to
suggest pulmonary embolism.
IMPRESSION: Very low probability pulmonary embolism.

## 2011-05-25 ENCOUNTER — Telehealth: Payer: Self-pay | Admitting: Pulmonary Disease

## 2011-05-25 DIAGNOSIS — E119 Type 2 diabetes mellitus without complications: Secondary | ICD-10-CM | POA: Diagnosis not present

## 2011-05-25 DIAGNOSIS — N950 Postmenopausal bleeding: Secondary | ICD-10-CM | POA: Diagnosis not present

## 2011-05-25 NOTE — Telephone Encounter (Signed)
Pt overdue for rov- will need ov most likely. Called the office and it was closed, WCB on 05/27/10

## 2011-05-28 NOTE — Telephone Encounter (Signed)
Spoke with Vernona Rieger. She states that the surgery has not been scheduled yet. I advised I will contact the pt and schedule ov with RA to see if she will be okay for the surgery. Called and spoke with pt and scheduled ov with RA for 06/06/11 at 9:45 am

## 2011-05-28 NOTE — Telephone Encounter (Signed)
ATC Vernona Rieger and was on hold x 5 minutes WCB

## 2011-05-29 ENCOUNTER — Encounter (HOSPITAL_COMMUNITY)
Admission: RE | Admit: 2011-05-29 | Discharge: 2011-05-29 | Disposition: A | Discharge status: Home / Self Care | Payer: BC Managed Care – PPO | Source: Ambulatory Visit | Attending: Pulmonary Disease | Admitting: Pulmonary Disease

## 2011-05-31 ENCOUNTER — Encounter (HOSPITAL_COMMUNITY)
Admission: RE | Admit: 2011-05-31 | Discharge: 2011-05-31 | Disposition: A | Discharge status: Home / Self Care | Payer: BC Managed Care – PPO | Source: Ambulatory Visit | Attending: Pulmonary Disease | Admitting: Pulmonary Disease

## 2011-06-05 ENCOUNTER — Encounter (HOSPITAL_COMMUNITY)
Admission: RE | Admit: 2011-06-05 | Discharge: 2011-06-05 | Disposition: A | Discharge status: Home / Self Care | Payer: Self-pay | Source: Ambulatory Visit | Attending: Pulmonary Disease | Admitting: Pulmonary Disease

## 2011-06-06 ENCOUNTER — Ambulatory Visit (INDEPENDENT_AMBULATORY_CARE_PROVIDER_SITE_OTHER): Payer: BC Managed Care – PPO | Admitting: Pulmonary Disease

## 2011-06-06 ENCOUNTER — Encounter: Payer: Self-pay | Admitting: Pulmonary Disease

## 2011-06-06 DIAGNOSIS — E662 Morbid (severe) obesity with alveolar hypoventilation: Secondary | ICD-10-CM

## 2011-06-06 DIAGNOSIS — G4733 Obstructive sleep apnea (adult) (pediatric): Secondary | ICD-10-CM | POA: Diagnosis not present

## 2011-06-06 NOTE — Progress Notes (Signed)
  Subjective:    Patient ID: Nina Howell, female    DOB: July 15, 1940, 71 y.o.   MRN: [id number]  HPI Primary Provider : Hayden Rasmussen, NP (Dr. Kellie Shropshire)   71/F, morbidly obese for FU of obstructive sleep apnea/ OHS .  Adm 04/10/10 with worsening dyspnea & hypoxia, VQ neg, BNP 103, diuresed, ABG 7.43/63/89/97% on 2 L, discharged on 2 L & Bilevel (empiric) ? 14/8  PSG subsequently showed moderate obstructive sleep apnea with AHi 15/h.  She was evaluated by [facility] Daphine Deutscher ) for a hepatic mass >> hemangioma  Echo july'11 >> EF 45-50%septal/ inferoseptal hypokinesis Algie Coffer)  Download dec-jan'12 >> excellent compliance & control of events on VPAP auto    06/06/2011 Need surgical clearance for hysterescopy-  dnc and polyp resection says they can use either iv sedation or spinal anesthesia -[place].  Very enthusiastic about rehab Compliant with BiPAP every night, has come off O2 Desaturated to 89% on 2nd lap in the office. Lost 100 lbs in 1 yr !  Using resistance machines upto 60 lbs!    Review of Systems Patient denies significant dyspnea,cough, hemoptysis,  chest pain, palpitations, pedal edema, orthopnea, paroxysmal nocturnal dyspnea, lightheadedness, nausea, vomiting, abdominal or  leg pains      Objective:   Physical Exam  Gen. Pleasant, obese, in no distress, normal affect ENT - no lesions, no post nasal drip, class 2-3 airway Neck: No JVD, no thyromegaly, no carotid bruits Lungs: no use of accessory muscles, no dullness to percussion, decreased without rales or rhonchi  Cardiovascular: Rhythm regular, heart sounds  normal, no murmurs or gallops, no peripheral edema Abdomen: soft and non-tender, no hepatosplenomegaly, BS normal. Musculoskeletal: No deformities, no cyanosis or clubbing Neuro:  alert, non focal, no tremors        Assessment & Plan:

## 2011-06-06 NOTE — Patient Instructions (Signed)
OK to proceed with surgery  Take your CPAP machine with you to use right after surgery Discuss with your anesthesiologist about spinal

## 2011-06-07 ENCOUNTER — Encounter (HOSPITAL_COMMUNITY)
Admission: RE | Admit: 2011-06-07 | Discharge: 2011-06-07 | Disposition: A | Discharge status: Home / Self Care | Payer: Self-pay | Source: Ambulatory Visit | Attending: Pulmonary Disease | Admitting: Pulmonary Disease

## 2011-06-07 NOTE — Assessment & Plan Note (Signed)
On VPAP auto with good control of events on download jan'12 Anesthesia to anticipate difficult airway Ensure she is placed on BiPAP in recovery. DVT prophylaxis & early mobilisation as is routine

## 2011-06-07 NOTE — Assessment & Plan Note (Signed)
Pulse oximetry as routine post op Choice of spinal vs general deferred to anesthesia - would prefer spinal if technically ok

## 2011-06-12 ENCOUNTER — Telehealth: Payer: Self-pay | Admitting: Pulmonary Disease

## 2011-06-12 ENCOUNTER — Encounter (HOSPITAL_COMMUNITY)
Admission: RE | Admit: 2011-06-12 | Discharge: 2011-06-12 | Disposition: A | Discharge status: Home / Self Care | Payer: Self-pay | Source: Ambulatory Visit | Attending: Pulmonary Disease | Admitting: Pulmonary Disease

## 2011-06-12 NOTE — Telephone Encounter (Signed)
Disregard this msg. As i have just faxed ov notes to dr. Estanislado Pandy. Nina Howell

## 2011-06-14 ENCOUNTER — Encounter (HOSPITAL_COMMUNITY)
Admission: RE | Admit: 2011-06-14 | Discharge: 2011-06-14 | Disposition: A | Discharge status: Home / Self Care | Payer: Self-pay | Source: Ambulatory Visit | Attending: Pulmonary Disease | Admitting: Pulmonary Disease

## 2011-06-14 ENCOUNTER — Other Ambulatory Visit: Payer: Self-pay | Admitting: Obstetrics and Gynecology

## 2011-06-15 ENCOUNTER — Encounter (HOSPITAL_COMMUNITY): Payer: Self-pay

## 2011-06-15 ENCOUNTER — Other Ambulatory Visit: Payer: Self-pay

## 2011-06-15 ENCOUNTER — Encounter (HOSPITAL_COMMUNITY)
Admission: RE | Admit: 2011-06-15 | Discharge: 2011-06-15 | Disposition: A | Discharge status: Home / Self Care | Payer: BC Managed Care – PPO | Source: Ambulatory Visit | Attending: Obstetrics and Gynecology | Admitting: Obstetrics and Gynecology

## 2011-06-15 DIAGNOSIS — Z01818 Encounter for other preprocedural examination: Secondary | ICD-10-CM | POA: Diagnosis not present

## 2011-06-15 DIAGNOSIS — I10 Essential (primary) hypertension: Secondary | ICD-10-CM | POA: Diagnosis not present

## 2011-06-15 DIAGNOSIS — N950 Postmenopausal bleeding: Secondary | ICD-10-CM | POA: Diagnosis not present

## 2011-06-15 DIAGNOSIS — E119 Type 2 diabetes mellitus without complications: Secondary | ICD-10-CM | POA: Diagnosis not present

## 2011-06-15 DIAGNOSIS — Z01812 Encounter for preprocedural laboratory examination: Secondary | ICD-10-CM | POA: Diagnosis not present

## 2011-06-15 DIAGNOSIS — N840 Polyp of corpus uteri: Secondary | ICD-10-CM | POA: Diagnosis not present

## 2011-06-15 LAB — CBC
Hemoglobin: 12.5 g/dL (ref 12.0–15.0)
MCH: 27.7 pg (ref 26.0–34.0)
MCHC: 32.1 g/dL (ref 30.0–36.0)
Platelets: 325 10*3/uL (ref 150–400)
RBC: 4.52 MIL/uL (ref 3.87–5.11)

## 2011-06-15 LAB — BASIC METABOLIC PANEL
CO2: 33 mEq/L — ABNORMAL HIGH (ref 19–32)
Calcium: 10 mg/dL (ref 8.4–10.5)
GFR calc Af Amer: >90 mL/min (ref >90)
GFR calc non Af Amer: >90 mL/min (ref >90)
Glucose, Bld: 154 mg/dL — ABNORMAL HIGH (ref 70–99)
Potassium: 3.4 mEq/L — ABNORMAL LOW (ref 3.5–5.1)
Sodium: 138 mEq/L (ref 135–145)

## 2011-06-18 ENCOUNTER — Encounter (HOSPITAL_COMMUNITY): Payer: Self-pay | Admitting: Anesthesiology

## 2011-06-18 ENCOUNTER — Encounter (HOSPITAL_COMMUNITY): Payer: Self-pay | Admitting: *Deleted

## 2011-06-18 ENCOUNTER — Ambulatory Visit (HOSPITAL_COMMUNITY)
Admission: RE | Admit: 2011-06-18 | Discharge: 2011-06-18 | Disposition: A | Discharge status: Home / Self Care | Payer: BC Managed Care – PPO | Source: Ambulatory Visit | Attending: Obstetrics and Gynecology | Admitting: Obstetrics and Gynecology

## 2011-06-18 ENCOUNTER — Encounter (HOSPITAL_COMMUNITY)
Admission: RE | Disposition: A | Discharge status: Home / Self Care | Payer: Self-pay | Source: Ambulatory Visit | Attending: Obstetrics and Gynecology

## 2011-06-18 ENCOUNTER — Other Ambulatory Visit: Payer: Self-pay | Admitting: Obstetrics and Gynecology

## 2011-06-18 ENCOUNTER — Ambulatory Visit (HOSPITAL_COMMUNITY): Payer: BC Managed Care – PPO | Admitting: Anesthesiology

## 2011-06-18 DIAGNOSIS — N950 Postmenopausal bleeding: Secondary | ICD-10-CM

## 2011-06-18 DIAGNOSIS — E119 Type 2 diabetes mellitus without complications: Secondary | ICD-10-CM | POA: Diagnosis not present

## 2011-06-18 DIAGNOSIS — E109 Type 1 diabetes mellitus without complications: Secondary | ICD-10-CM | POA: Diagnosis not present

## 2011-06-18 DIAGNOSIS — I10 Essential (primary) hypertension: Secondary | ICD-10-CM | POA: Insufficient documentation

## 2011-06-18 DIAGNOSIS — Z01818 Encounter for other preprocedural examination: Secondary | ICD-10-CM | POA: Diagnosis not present

## 2011-06-18 DIAGNOSIS — Z01812 Encounter for preprocedural laboratory examination: Secondary | ICD-10-CM | POA: Insufficient documentation

## 2011-06-18 DIAGNOSIS — N840 Polyp of corpus uteri: Secondary | ICD-10-CM | POA: Diagnosis not present

## 2011-06-18 DIAGNOSIS — E669 Obesity, unspecified: Secondary | ICD-10-CM | POA: Diagnosis not present

## 2011-06-18 DIAGNOSIS — IMO0001 Reserved for inherently not codable concepts without codable children: Secondary | ICD-10-CM | POA: Diagnosis not present

## 2011-06-18 DIAGNOSIS — I509 Heart failure, unspecified: Secondary | ICD-10-CM | POA: Diagnosis not present

## 2011-06-18 LAB — COMPREHENSIVE METABOLIC PANEL
ALT: 12 U/L (ref 0–35)
AST: 12 U/L (ref 0–37)
Albumin: 3.8 g/dL (ref 3.5–5.2)
Alkaline Phosphatase: 119 U/L — ABNORMAL HIGH (ref 39–117)
CO2: 31 mEq/L (ref 19–32)
Chloride: 95 mEq/L — ABNORMAL LOW (ref 96–112)
GFR calc non Af Amer: >90 mL/min (ref >90)
Potassium: 3.5 mEq/L (ref 3.5–5.1)
Total Bilirubin: 0.3 mg/dL (ref 0.3–1.2)

## 2011-06-18 LAB — CBC
HCT: 38.7 % (ref 36.0–46.0)
MCV: 85.8 fL (ref 78.0–100.0)
Platelets: 310 10*3/uL (ref 150–400)
RBC: 4.51 MIL/uL (ref 3.87–5.11)
RDW: 15.9 % — ABNORMAL HIGH (ref 11.5–15.5)
WBC: 11.6 10*3/uL — ABNORMAL HIGH (ref 4.0–10.5)

## 2011-06-18 LAB — GLUCOSE, CAPILLARY: Glucose-Capillary: 138 mg/dL — ABNORMAL HIGH (ref 70–99)

## 2011-06-18 SURGERY — DILATATION & CURETTAGE/HYSTEROSCOPY WITH RESECTOCOPE
Anesthesia: Spinal

## 2011-06-18 MED ORDER — PHENYLEPHRINE HCL 10 MG/ML IJ SOLN
10.0000 mg | INTRAVENOUS | Status: DC | PRN
  Administered 2011-06-18: 20 ug/min via INTRAVENOUS

## 2011-06-18 MED ORDER — PROPOFOL 10 MG/ML IV EMUL
INTRAVENOUS | Status: AC
  Filled 2011-06-18: qty 20

## 2011-06-18 MED ORDER — LACTATED RINGERS IV SOLN
INTRAVENOUS | Status: DC
  Administered 2011-06-18 (×3): via INTRAVENOUS

## 2011-06-18 MED ORDER — SILVER NITRATE-POT NITRATE 75-25 % EX MISC
CUTANEOUS | Status: DC | PRN
  Administered 2011-06-18: 1

## 2011-06-18 MED ORDER — MIDAZOLAM HCL 5 MG/5ML IJ SOLN
INTRAMUSCULAR | Status: DC | PRN
  Administered 2011-06-18: .5 mg via INTRAVENOUS
  Administered 2011-06-18 (×2): .25 mg via INTRAVENOUS
  Administered 2011-06-18: .5 mg via INTRAVENOUS
  Administered 2011-06-18 (×2): .25 mg via INTRAVENOUS

## 2011-06-18 MED ORDER — GENTAMICIN SULFATE 40 MG/ML IJ SOLN
INTRAVENOUS | Status: AC
  Administered 2011-06-18: 100 mL via INTRAVENOUS
  Filled 2011-06-18: qty 2.5

## 2011-06-18 MED ORDER — LIDOCAINE HCL (CARDIAC) 20 MG/ML IV SOLN
INTRAVENOUS | Status: AC
  Filled 2011-06-18: qty 5

## 2011-06-18 MED ORDER — ACETAMINOPHEN 325 MG PO TABS: 325.0000 mg | ORAL_TABLET | ORAL | Status: DC | PRN

## 2011-06-18 MED ORDER — GLYCINE 1.5 % IR SOLN
Status: DC | PRN
  Administered 2011-06-18: 1

## 2011-06-18 MED ORDER — BUPIVACAINE IN DEXTROSE 0.75-8.25 % IT SOLN
INTRATHECAL | Status: DC | PRN
  Administered 2011-06-18: 1.8 mL via INTRATHECAL

## 2011-06-18 MED ORDER — KETOROLAC TROMETHAMINE 30 MG/ML IJ SOLN
INTRAMUSCULAR | Status: AC
  Filled 2011-06-18: qty 2

## 2011-06-18 MED ORDER — SILVER NITRATE-POT NITRATE 75-25 % EX MISC
CUTANEOUS | Status: AC
  Filled 2011-06-18: qty 1

## 2011-06-18 MED ORDER — FENTANYL CITRATE 0.05 MG/ML IJ SOLN: 25.0000 ug | INTRAMUSCULAR | Status: DC | PRN

## 2011-06-18 MED ORDER — DEXAMETHASONE SODIUM PHOSPHATE 10 MG/ML IJ SOLN
INTRAMUSCULAR | Status: AC
  Filled 2011-06-18: qty 2

## 2011-06-18 MED ORDER — MIDAZOLAM HCL 2 MG/2ML IJ SOLN
INTRAMUSCULAR | Status: AC
  Filled 2011-06-18: qty 2

## 2011-06-18 MED ORDER — FENTANYL CITRATE 0.05 MG/ML IJ SOLN
INTRAMUSCULAR | Status: AC
  Filled 2011-06-18: qty 4

## 2011-06-18 MED ORDER — PROMETHAZINE HCL 25 MG/ML IJ SOLN: 6.2500 mg | INTRAMUSCULAR | Status: DC | PRN

## 2011-06-18 MED ORDER — PHENYLEPHRINE HCL 10 MG/ML IJ SOLN
INTRAMUSCULAR | Status: AC
  Filled 2011-06-18: qty 1

## 2011-06-18 MED ORDER — ONDANSETRON HCL 4 MG/2ML IJ SOLN
INTRAMUSCULAR | Status: DC | PRN
  Administered 2011-06-18: 4 mg via INTRAVENOUS

## 2011-06-18 MED ORDER — ONDANSETRON HCL 4 MG/2ML IJ SOLN
INTRAMUSCULAR | Status: AC
  Filled 2011-06-18: qty 2

## 2011-06-18 MED ORDER — ACETAMINOPHEN 10 MG/ML IV SOLN
1000.0000 mg | Freq: Four times a day (QID) | INTRAVENOUS | Status: DC | PRN
  Administered 2011-06-18: 1000 mg via INTRAVENOUS
  Filled 2011-06-18: qty 100

## 2011-06-18 MED ORDER — CHLOROPROCAINE HCL 1 % IJ SOLN
INTRAMUSCULAR | Status: AC
  Filled 2011-06-18: qty 30

## 2011-06-18 SURGICAL SUPPLY — 19 items
BOOTIES KNEE HIGH SLOAN (MISCELLANEOUS) ×4 IMPLANT
CANISTER SUCTION 2500CC (MISCELLANEOUS) ×2 IMPLANT
CATH ROBINSON RED A/P 16FR (CATHETERS) ×2 IMPLANT
CLOTH BEACON ORANGE TIMEOUT ST (SAFETY) ×2 IMPLANT
CONTAINER PREFILL 10% NBF 60ML (FORM) ×4 IMPLANT
CORD ACTIVE DISPOSABLE (ELECTRODE) ×1
CORD ELECTRO ACTIVE DISP (ELECTRODE) ×1 IMPLANT
ELECT LOOP GYNE PRO 24FR (CUTTING LOOP)
ELECT REM PT RETURN 9FT ADLT (ELECTROSURGICAL) ×2
ELECT VAPORTRODE GRVD BAR (ELECTRODE) IMPLANT
ELECTRODE LOOP GYNE PRO 24FR (CUTTING LOOP) IMPLANT
ELECTRODE REM PT RTRN 9FT ADLT (ELECTROSURGICAL) ×1 IMPLANT
GLOVE BIOGEL PI IND STRL 7.0 (GLOVE) ×2 IMPLANT
GLOVE BIOGEL PI INDICATOR 7.0 (GLOVE) ×2
GOWN PREVENTION PLUS LG XLONG (DISPOSABLE) ×4 IMPLANT
GOWN STRL REIN XL XLG (GOWN DISPOSABLE) ×2 IMPLANT
PACK HYSTEROSCOPY LF (CUSTOM PROCEDURE TRAY) ×2 IMPLANT
TOWEL OR 17X24 6PK STRL BLUE (TOWEL DISPOSABLE) ×2 IMPLANT
WATER STERILE IRR 1000ML POUR (IV SOLUTION) ×2 IMPLANT

## 2011-06-18 NOTE — Anesthesia Preprocedure Evaluation (Addendum)
Anesthesia Evaluation  Patient identified by MRN, date of birth, ID band Patient awake    Reviewed: Allergy & Precautions, H&P , Patient's Chart, lab work & pertinent test results  Airway Mallampati: II      Dental No notable dental hx.    Pulmonary neg pulmonary ROS, sleep apnea ,  clear to auscultation  Pulmonary exam normal       Cardiovascular Exercise Tolerance: Good hypertension, +CHF and neg cardio ROS regular Normal    Neuro/Psych PSYCHIATRIC DISORDERS Depression Negative Neurological ROS  Negative Psych ROS   GI/Hepatic negative GI ROS, Neg liver ROS,   Endo/Other  Negative Endocrine ROSDiabetes mellitus-Hypothyroidism Morbid obesity  Renal/GU negative Renal ROS  Genitourinary negative   Musculoskeletal   Abdominal Normal abdominal exam  (+)   Peds  Hematology negative hematology ROS (+)   Anesthesia Other Findings Cellulitis   ABDOMINAL WALL CHF (congestive heart failure)   DECOMPENSATED SYSTOLIC CHF    Hypoventilation     Hypokalemia        CHF (congestive heart failure)     Diabetes mellitus, type 2        Hypothyroidism     Varicose veins        Morbid obesity    Reproductive/Obstetrics negative OB ROS                           Anesthesia Physical Anesthesia Plan  ASA: III  Anesthesia Plan: Spinal   Post-op Pain Management:    Induction:   Airway Management Planned:   Additional Equipment:   Intra-op Plan:   Post-operative Plan:   Informed Consent: I have reviewed the patients History and Physical, chart, labs and discussed the procedure including the risks, benefits and alternatives for the proposed anesthesia with the patient or authorized representative who has indicated his/her understanding and acceptance.     Plan Discussed with: Anesthesiologist, CRNA and Surgeon  Anesthesia Plan Comments: (Iv phenyephrine gtt)        Anesthesia Quick  Evaluation

## 2011-06-18 NOTE — Brief Op Note (Signed)
06/18/2011  1:25 PM  PATIENT:  Nina Howell  71 y.o. female  PRE-OPERATIVE DIAGNOSIS:  Post Menopausal bleeding, Endometrial polyp,Morbid Obesity  POST-OPERATIVE DIAGNOSIS:  Post Menopausal bleeding, Endometrial polyp, Morbid Obesity    Procedure(s): DILATATION & CURETTAGE/HYSTEROSCOPY WITH RESECTOCOPE    Surgeon(s): Esmeralda Arthur, MD   ASSISTANTS: none   ANESTHESIA:   spinal  LOCAL MEDICATIONS USED:  NONE  SPECIMEN:  endometrial resections and curettings  DISPOSITION OF SPECIMEN:  PATHOLOGY  COUNTS:  YES  ESTIMATED BLOOD LOSS: minimal  PATIENT DISPOSITION:  PACU - hemodynamically stable.      Hae Ahlers AMD 06/18/2011 1:25 PM

## 2011-06-18 NOTE — Preoperative (Signed)
Beta Blockers   Reason not to administer Beta Blockers:Not Applicable 

## 2011-06-18 NOTE — H&P (Signed)
Reason for admission: Postmenopausal bleeding  History of present illness:  This is a 71 year old married African American female gravida 5 para 4 who has been menopausal since the age of 60 and came to me for an annual exam on 04/27/2011. During that visit she admits to a second episode of postmenopausal bleeding which occurred during the summer around July 2012. She has been previously followed by Dr. Zada Finders and an endometrial biopsy in office setting revealed simple hyperplasia in November of 2011. This patient is also known for morbid obesity and has lost over 100 pounds in the last year. She also reports having used Premarin hormone replacement therapy for about 5 years after the onset of menopause and no hormone replacement therapy since then. She presents today to undergo a hysteroscopy with resection of endometrial polyp and D&C.  Review of systems :  Constitutional: Normal  Cardiovascular: Known for congestive heart failure under control with a medical clearance from her cardiologist.  Pulmonary: Sleep apnea also with medical clearance from her pulmonologist.   Genitourinary: Negative other than reason for admission.  Past medical history:  Type 2 diabetes  Dyslipidemia  Morbid obesity  Hypertension  Sleep apnea  History of congestive heart failure with pulmonary  edema  Allergy: Penicillin, cephalosporins, oxycodone, Celebrex.  Current medication: Please refer to extensive list provided by her cardiologist.  Physical exam:  Blood pressure 124/70, weight 319 pounds, height 5 foot 1 inch, BMI 61  HEENT: normal  Thyroid: Not enlarged  Cardiovascular: Regular rate and rhythm  Chest: Clear  Breasts: Normal  Abdomen: No tenderness no hepatosplenomegaly  Musculoskeletal: Normal  Pelvic: Normal external genitalia, normal vagina, normal cervix, uterus is normal in size and shape but exam is limited by body habitus, adnexa not felt  Rectovaginal:  Normal.  Sonohysterogram on 05/25/2011: Uterus measuring 3 x 7.4 x 5.2 cm with an endometrium at 1.16 cm. An endometrial polyp is identified measuring 2.3 cm. Both ovaries are seen and appear normal.  Assessment: Postmenopausal bleeding with endometrial polyp in an otherwise morbidly obese and hypertensive patient. Need to rule out endometrial pathology.  Plan: We will proceed with hysteroscopy resection of endometrial polyp and D&C. The procedure has been thoroughly reviewed with the patient including possible risks that are not limited to bleeding, infection and injury to uterus and intra-abdominal organ. The patient voiced understanding and is agreeable to proceed. A preoperative consultation with anesthesiologist on the recommendation from cardiologist and pulmonologist recommended to proceed with spinal anesthesia.

## 2011-06-18 NOTE — Anesthesia Procedure Notes (Signed)
Spinal  Patient location during procedure: OR Start time: 06/18/2011 12:33 PM Staffing Anesthesiologist: Brayton Caves R Performed by: anesthesiologist  Preanesthetic Checklist Completed: patient identified, site marked, surgical consent, pre-op evaluation, timeout performed, IV checked, risks and benefits discussed and monitors and equipment checked Spinal Block Patient position: sitting Prep: DuraPrep Patient monitoring: heart rate, cardiac monitor, continuous pulse ox and blood pressure Approach: midline Location: L3-4 Injection technique: single-shot Needle Needle type: Sprotte  Needle gauge: 24 G Needle length: 9 cm Assessment Sensory level: T4 Additional Notes Patient identified.  Risk benefits discussed including failed block, incomplete pain control, headache, nerve damage, paralysis, blood pressure changes, nausea, vomiting, reactions to medication both toxic or allergic, and postpartum back pain.  Patient expressed understanding and wished to proceed.  All questions were answered.  Sterile technique used throughout procedure.  CSF was clear.  No parasthesia or other complications.  Please see nursing notes for vital signs.

## 2011-06-18 NOTE — Op Note (Signed)
Preoperative diagnosis: Postmenopausal bleeding  Postoperative diagnosis: Same  Anesthesia: Spinal  Anesthesiologist: Dr. Brayton Caves  Procedure: Hysteroscopy, resection of endometrial polyp, dilatation and curettage  Surgeon: Dr. Dois Davenport Jonatha Gagen  Estimated blood loss: Minimal  Procedure:  After being informed of the planned procedure and possible complications, including bleeding, infection, injury to uterus, informed consent was obtained and patient was taken to or #8. She was given spinal anesthesia without complication. She was placed in a lithotomy position, prepped and draped in the style fashion and her bladder was emptied with an in and out red rubber catheter.  A weighted speculum is inserted in the vagina and the anterior lip of the cervix was grasped with a tenaculum forcep. Uterus is sounded to 8.5 cm. The cervix is easily dilated using Hegar dilator on total #30 one. This allows for easy placement of the operative hysteroscope with resectoscope and perfusion of glycine at a maximum pressure of 90 mm of mercury.  Observation:  We note a large endometrial polyp with a wide base in the posterior lower uterine segment. Past this polyp, we visualize the fundus of the uterus as well as both tubal os And they do appear normal.  We proceed with resection of the endometrial polyp. Hysteroscope was then removed and we proceed with sharp curettage of the endometrial cavity. We returned the hysteroscope to visualize a clean endometrial cavity with no active bleeding.  Instrument and sponge count is complete x2. Estimated blood loss is minimal. Water deficit is 120 cc.  The procedure is well tolerated by the patient is taken to recovery room in a well and stable condition.  Specimen: Endometrial resections and endometrial curettings sent to pathology

## 2011-06-18 NOTE — Anesthesia Postprocedure Evaluation (Addendum)
Anesthesia Post Note  Patient: Nina Howell  Procedure(s) Performed:  DILATATION & CURETTAGE/HYSTEROSCOPY WITH RESECTOCOPE  Anesthesia type: SAB  Patient location: PACU  Post pain: Pain level controlled  Post assessment: Post-op Vital signs reviewed  Last Vitals:  Filed Vitals:   06/18/11 0922  Pulse: 70  Temp: 36.8 C  Resp: 16    Post vital signs: Reviewed  Level of consciousness: sedated  Complications: No apparent anesthesia complications

## 2011-06-18 NOTE — Progress Notes (Signed)
Patient ambulated to recliner in PACU phase 2.  Ambulating better, but continues to be unsteady on her feet, requiring assistance from RN.  Pt states that she has an entire flight of stairs to climb once home.  Will let patient stay in phase 2 until gait improved and legs stronger.

## 2011-06-18 NOTE — Addendum Note (Signed)
Addendum  created 06/18/11 1500 by Velna Hatchet, MD   Modules edited:Notes Section

## 2011-06-18 NOTE — Transfer of Care (Signed)
Immediate Anesthesia Transfer of Care Note  Patient: Nina Howell  Procedure(s) Performed:  DILATATION & CURETTAGE/HYSTEROSCOPY WITH RESECTOCOPE  Patient Location: PACU  Anesthesia Type: Spinal  Level of Consciousness: awake, alert , oriented and patient cooperative  Airway & Oxygen Therapy: Patient Spontanous Breathing and Patient connected to nasal cannula oxygen  Post-op Assessment: Report given to PACU RN and Post -op Vital signs reviewed and stable  Post vital signs: Reviewed and stable  Complications: No apparent anesthesia complications

## 2011-06-18 NOTE — Progress Notes (Signed)
Patient walked around [place] with steady gait using her cane.  Ok to d/c home with husband.

## 2011-06-19 ENCOUNTER — Encounter (HOSPITAL_COMMUNITY)
Admission: RE | Admit: 2011-06-19 | Discharge: 2011-06-19 | Disposition: A | Discharge status: Home / Self Care | Payer: Self-pay | Source: Ambulatory Visit | Attending: Pulmonary Disease | Admitting: Pulmonary Disease

## 2011-06-21 ENCOUNTER — Encounter (HOSPITAL_COMMUNITY)
Admission: RE | Admit: 2011-06-21 | Discharge: 2011-06-21 | Disposition: A | Discharge status: Home / Self Care | Payer: Self-pay | Source: Ambulatory Visit | Attending: Pulmonary Disease | Admitting: Pulmonary Disease

## 2011-06-21 NOTE — Progress Notes (Signed)
Spoke with pt.  Pt frustrated because her weight has stayed the same for "a while now." Per pt, pt is eating "significantly less than 1200 kcal a day."  Pt encouraged to try increasing her kcal intake to 1200 daily.  Pt discouraged from going below 1200 kcal daily.  Pt is taking a MVI daily.  Pt has increased her activity level (e.g. Walking around [place], using a cane instead of a wheel chair, increased ADLs).  Pt encouraged to talk with Brock Ra, EP, re: appropriate activity level.  Pt expressed understanding. Continue client-centered nutrition education by RD as part of interdisciplinary care.  Monitor and evaluate progress toward nutrition goal with team.

## 2011-06-26 ENCOUNTER — Encounter (HOSPITAL_COMMUNITY)
Admission: RE | Admit: 2011-06-26 | Discharge: 2011-06-26 | Disposition: A | Discharge status: Home / Self Care | Payer: Self-pay | Source: Ambulatory Visit | Attending: Pulmonary Disease | Admitting: Pulmonary Disease

## 2011-06-26 DIAGNOSIS — E662 Morbid (severe) obesity with alveolar hypoventilation: Secondary | ICD-10-CM | POA: Insufficient documentation

## 2011-06-26 DIAGNOSIS — Z5189 Encounter for other specified aftercare: Secondary | ICD-10-CM | POA: Insufficient documentation

## 2011-06-26 DIAGNOSIS — E039 Hypothyroidism, unspecified: Secondary | ICD-10-CM | POA: Insufficient documentation

## 2011-06-26 DIAGNOSIS — G4733 Obstructive sleep apnea (adult) (pediatric): Secondary | ICD-10-CM | POA: Insufficient documentation

## 2011-06-26 DIAGNOSIS — R0902 Hypoxemia: Secondary | ICD-10-CM | POA: Insufficient documentation

## 2011-06-26 DIAGNOSIS — E119 Type 2 diabetes mellitus without complications: Secondary | ICD-10-CM | POA: Insufficient documentation

## 2011-06-28 ENCOUNTER — Encounter (HOSPITAL_COMMUNITY)
Admission: RE | Admit: 2011-06-28 | Discharge: 2011-06-28 | Disposition: A | Discharge status: Home / Self Care | Payer: Self-pay | Source: Ambulatory Visit | Attending: Pulmonary Disease | Admitting: Pulmonary Disease

## 2011-07-03 ENCOUNTER — Encounter (HOSPITAL_COMMUNITY)
Admission: RE | Admit: 2011-07-03 | Discharge: 2011-07-03 | Disposition: A | Discharge status: Home / Self Care | Payer: Self-pay | Source: Ambulatory Visit | Attending: Pulmonary Disease | Admitting: Pulmonary Disease

## 2011-07-04 ENCOUNTER — Telehealth (INDEPENDENT_AMBULATORY_CARE_PROVIDER_SITE_OTHER): Payer: Self-pay | Admitting: General Surgery

## 2011-07-05 ENCOUNTER — Encounter (HOSPITAL_COMMUNITY)
Admission: RE | Admit: 2011-07-05 | Discharge: 2011-07-05 | Disposition: A | Discharge status: Home / Self Care | Payer: Self-pay | Source: Ambulatory Visit | Attending: Pulmonary Disease | Admitting: Pulmonary Disease

## 2011-07-05 ENCOUNTER — Telehealth (INDEPENDENT_AMBULATORY_CARE_PROVIDER_SITE_OTHER): Payer: Self-pay

## 2011-07-05 NOTE — Telephone Encounter (Signed)
Spoke with the patient and confirmed that she was to return for a one year follow up and [place] in March 2014.

## 2011-07-10 ENCOUNTER — Encounter (HOSPITAL_COMMUNITY)
Admission: RE | Admit: 2011-07-10 | Discharge: 2011-07-10 | Disposition: A | Discharge status: Home / Self Care | Payer: Self-pay | Source: Ambulatory Visit | Attending: Pulmonary Disease | Admitting: Pulmonary Disease

## 2011-07-12 ENCOUNTER — Encounter (HOSPITAL_COMMUNITY)
Admission: RE | Admit: 2011-07-12 | Discharge: 2011-07-12 | Disposition: A | Discharge status: Home / Self Care | Payer: Self-pay | Source: Ambulatory Visit | Attending: Pulmonary Disease | Admitting: Pulmonary Disease

## 2011-07-17 ENCOUNTER — Encounter (HOSPITAL_COMMUNITY)
Admission: RE | Admit: 2011-07-17 | Discharge: 2011-07-17 | Disposition: A | Discharge status: Home / Self Care | Payer: Self-pay | Source: Ambulatory Visit | Attending: Pulmonary Disease | Admitting: Pulmonary Disease

## 2011-07-19 ENCOUNTER — Encounter (HOSPITAL_COMMUNITY)
Admission: RE | Admit: 2011-07-19 | Discharge: 2011-07-19 | Disposition: A | Discharge status: Home / Self Care | Payer: Self-pay | Source: Ambulatory Visit | Attending: Pulmonary Disease | Admitting: Pulmonary Disease

## 2011-07-24 ENCOUNTER — Encounter (HOSPITAL_COMMUNITY)
Admission: RE | Admit: 2011-07-24 | Discharge: 2011-07-24 | Disposition: A | Discharge status: Home / Self Care | Payer: Self-pay | Source: Ambulatory Visit | Attending: Pulmonary Disease | Admitting: Pulmonary Disease

## 2011-07-24 DIAGNOSIS — R0902 Hypoxemia: Secondary | ICD-10-CM | POA: Insufficient documentation

## 2011-07-24 DIAGNOSIS — E119 Type 2 diabetes mellitus without complications: Secondary | ICD-10-CM | POA: Insufficient documentation

## 2011-07-24 DIAGNOSIS — E662 Morbid (severe) obesity with alveolar hypoventilation: Secondary | ICD-10-CM | POA: Insufficient documentation

## 2011-07-24 DIAGNOSIS — G4733 Obstructive sleep apnea (adult) (pediatric): Secondary | ICD-10-CM | POA: Insufficient documentation

## 2011-07-24 DIAGNOSIS — E039 Hypothyroidism, unspecified: Secondary | ICD-10-CM | POA: Insufficient documentation

## 2011-07-24 DIAGNOSIS — Z5189 Encounter for other specified aftercare: Secondary | ICD-10-CM | POA: Insufficient documentation

## 2011-07-26 ENCOUNTER — Encounter (HOSPITAL_COMMUNITY)
Admission: RE | Admit: 2011-07-26 | Discharge: 2011-07-26 | Disposition: A | Discharge status: Home / Self Care | Payer: Self-pay | Source: Ambulatory Visit | Attending: Pulmonary Disease | Admitting: Pulmonary Disease

## 2011-07-26 NOTE — Progress Notes (Signed)
  Subjective:    Patient ID: Nina Howell, female    DOB: [birth year], 71 y.o.   MRN: [id number]  HPI    Review of Systems     Objective:   Physical Exam        Assessment & Plan:

## 2011-07-26 NOTE — Progress Notes (Addendum)
Pt CBG after exercise 79 lemonade and snack given no s/s. Pt instructed to call from home with  recheck CBG. Pt called with recheck  at home CBG 120.

## 2011-07-27 ENCOUNTER — Encounter: Payer: BC Managed Care – PPO | Admitting: Obstetrics and Gynecology

## 2011-07-30 ENCOUNTER — Encounter (HOSPITAL_COMMUNITY): Payer: Self-pay

## 2011-07-31 ENCOUNTER — Encounter (HOSPITAL_COMMUNITY): Payer: Self-pay

## 2011-08-01 ENCOUNTER — Encounter (INDEPENDENT_AMBULATORY_CARE_PROVIDER_SITE_OTHER): Payer: BC Managed Care – PPO | Admitting: Obstetrics and Gynecology

## 2011-08-01 DIAGNOSIS — Z30430 Encounter for insertion of intrauterine contraceptive device: Secondary | ICD-10-CM

## 2011-08-02 ENCOUNTER — Encounter (HOSPITAL_COMMUNITY)
Admission: RE | Admit: 2011-08-02 | Discharge: 2011-08-02 | Disposition: A | Discharge status: Home / Self Care | Payer: Self-pay | Source: Ambulatory Visit | Attending: Pulmonary Disease | Admitting: Pulmonary Disease

## 2011-08-03 ENCOUNTER — Encounter (HOSPITAL_COMMUNITY): Payer: Self-pay

## 2011-08-06 NOTE — Addendum Note (Signed)
Addendum  created 08/06/11 1035 by Velna Hatchet, MD   Modules edited:Anesthesia Events

## 2011-08-06 NOTE — Addendum Note (Signed)
Addendum  created 08/06/11 1035 by Keidan Aumiller R Clemma Johnsen, MD   Modules edited:Anesthesia Events    

## 2011-08-07 ENCOUNTER — Encounter (HOSPITAL_COMMUNITY)
Admission: RE | Admit: 2011-08-07 | Discharge: 2011-08-07 | Disposition: A | Discharge status: Home / Self Care | Payer: Self-pay | Source: Ambulatory Visit | Attending: Pulmonary Disease | Admitting: Pulmonary Disease

## 2011-08-09 ENCOUNTER — Encounter (HOSPITAL_COMMUNITY)
Admission: RE | Admit: 2011-08-09 | Discharge: 2011-08-09 | Disposition: A | Discharge status: Home / Self Care | Payer: Self-pay | Source: Ambulatory Visit | Attending: Pulmonary Disease | Admitting: Pulmonary Disease

## 2011-08-09 ENCOUNTER — Encounter (INDEPENDENT_AMBULATORY_CARE_PROVIDER_SITE_OTHER): Payer: Medicare Other | Admitting: Obstetrics and Gynecology

## 2011-08-09 DIAGNOSIS — Z30431 Encounter for routine checking of intrauterine contraceptive device: Secondary | ICD-10-CM | POA: Diagnosis not present

## 2011-08-14 ENCOUNTER — Encounter (HOSPITAL_COMMUNITY)
Admission: RE | Admit: 2011-08-14 | Discharge: 2011-08-14 | Disposition: A | Discharge status: Home / Self Care | Payer: Self-pay | Source: Ambulatory Visit | Attending: Pulmonary Disease | Admitting: Pulmonary Disease

## 2011-08-14 NOTE — Progress Notes (Signed)
Time Spent: 60 minutes Nutrition Note Spoke with pt. Pt well-known to this Clinical research associate from [facility] bi-weekly over the past year. Pt saw the RD, CDE at Dr. Daune Perch office. Per discussion, pt overwhelmed and frustrated. Pt has had several medical issues over the last month (a bad cold and GYN issues). Pt also concerned re: her husband and possible STM loss, which pt's husband "won't go see anybody about it." Pt reports her most recent A1c elevated to 7.4. Pt states, "they look at me and assume it's because I'm eating too much." Pt has been extremely dedicated to her diet and exercise routine and has lost over 100 lbs, which pt feels gets overlooked frequently. Per discussion with pt, feel A1c may be elevated to recent illness, pt taking cough syrup, and decreased activity level with illness. Pt reports that after thinking through her appt that some goals set with RD at Dr. Daune Perch office will not work for her. Pt states that she can not eat more than her cereal for breakfast in the morning "I wouldn't eat anything if it weren't for my cereal." Pt does not feel she can eat an addition 2 pieces of toast with cheese at breakfast. Pt agreed that she could eat 100 kcal pack of almonds or a cheese stick and a piece of fruit after exercise. Pt runs errands frequently after exercise and "I need my snack to be able to do what I need to go do before I go home for lunch." Thyroid medication and milk consumption discussed. Pt taking Synthroid 30 minutes prior to breakfast. This Clinical research associate spoke with an Inpatient Pharmacist.  Pharmacist states that the Synthroid should be fully absorbed if taken on an empty stomach 30 minutes before eating and pt doesn't have to worry about milk consumption if Rx directions followed. Continue client-centered nutrition education by RD as part of interdisciplinary care.  Monitor and evaluate progress toward nutrition goal with team.

## 2011-08-16 ENCOUNTER — Encounter (HOSPITAL_COMMUNITY)
Admission: RE | Admit: 2011-08-16 | Discharge: 2011-08-16 | Disposition: A | Discharge status: Home / Self Care | Payer: Self-pay | Source: Ambulatory Visit | Attending: Pulmonary Disease | Admitting: Pulmonary Disease

## 2011-08-21 ENCOUNTER — Encounter (HOSPITAL_COMMUNITY)
Admission: RE | Admit: 2011-08-21 | Discharge: 2011-08-21 | Disposition: A | Discharge status: Home / Self Care | Payer: Self-pay | Source: Ambulatory Visit | Attending: Pulmonary Disease | Admitting: Pulmonary Disease

## 2011-08-21 DIAGNOSIS — E119 Type 2 diabetes mellitus without complications: Secondary | ICD-10-CM | POA: Insufficient documentation

## 2011-08-21 DIAGNOSIS — R0902 Hypoxemia: Secondary | ICD-10-CM | POA: Insufficient documentation

## 2011-08-21 DIAGNOSIS — Z5189 Encounter for other specified aftercare: Secondary | ICD-10-CM | POA: Insufficient documentation

## 2011-08-21 DIAGNOSIS — E662 Morbid (severe) obesity with alveolar hypoventilation: Secondary | ICD-10-CM | POA: Insufficient documentation

## 2011-08-21 DIAGNOSIS — E039 Hypothyroidism, unspecified: Secondary | ICD-10-CM | POA: Insufficient documentation

## 2011-08-21 DIAGNOSIS — G4733 Obstructive sleep apnea (adult) (pediatric): Secondary | ICD-10-CM | POA: Insufficient documentation

## 2011-08-23 ENCOUNTER — Encounter (HOSPITAL_COMMUNITY)
Admission: RE | Admit: 2011-08-23 | Discharge: 2011-08-23 | Disposition: A | Discharge status: Home / Self Care | Payer: Self-pay | Source: Ambulatory Visit | Attending: Pulmonary Disease | Admitting: Pulmonary Disease

## 2011-08-28 ENCOUNTER — Encounter (HOSPITAL_COMMUNITY)
Admission: RE | Admit: 2011-08-28 | Discharge: 2011-08-28 | Disposition: A | Discharge status: Home / Self Care | Payer: Self-pay | Source: Ambulatory Visit | Attending: Pulmonary Disease | Admitting: Pulmonary Disease

## 2011-08-30 ENCOUNTER — Encounter (HOSPITAL_COMMUNITY)
Admission: RE | Admit: 2011-08-30 | Discharge: 2011-08-30 | Disposition: A | Discharge status: Home / Self Care | Payer: Self-pay | Source: Ambulatory Visit | Attending: Pulmonary Disease | Admitting: Pulmonary Disease

## 2011-09-04 ENCOUNTER — Encounter (HOSPITAL_COMMUNITY)
Admission: RE | Admit: 2011-09-04 | Discharge: 2011-09-04 | Disposition: A | Discharge status: Home / Self Care | Payer: Self-pay | Source: Ambulatory Visit | Attending: Pulmonary Disease | Admitting: Pulmonary Disease

## 2011-09-06 ENCOUNTER — Encounter (HOSPITAL_COMMUNITY)
Admission: RE | Admit: 2011-09-06 | Discharge: 2011-09-06 | Disposition: A | Discharge status: Home / Self Care | Payer: Self-pay | Source: Ambulatory Visit | Attending: Pulmonary Disease | Admitting: Pulmonary Disease

## 2011-09-11 ENCOUNTER — Encounter (HOSPITAL_COMMUNITY)
Admission: RE | Admit: 2011-09-11 | Discharge: 2011-09-11 | Disposition: A | Discharge status: Home / Self Care | Payer: Self-pay | Source: Ambulatory Visit | Attending: Pulmonary Disease | Admitting: Pulmonary Disease

## 2011-09-11 NOTE — Progress Notes (Signed)
Pulmonary Rehab Progress Note  Patient is doing great in rehab. Patient has been doing isometrics with her weight training. Can see an improvement. Workloads have increased. Vitals stable. Patients oxygen saturation is improving on RA. Patient staying greater than or equal to 95% on RA. Weight is decreasing. Goals are being met.     Spoke with patient today regarding her situation and using valet parking. 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 I asked the patient why she was using guest services after reaching her goal of walking the full time back to [place]. Patient stated "MD told her not to walk back until she was under 300 pounds". I explained to the patient that he meant if she parked in the visitor parking due to the inclined sidewalks. Patient was instructed to walk back and forth from [place] like she had been doing the last 6 months since she worked so hard in the [place] program to accomplish her goal. Patient agreed and today was her first day walking back to [place]. RN went with her just in case with a pulse ox and a wheelchair. RN came back and explained she did great with no complaints or low sats.  I let patient know she is more than welcome to use guest services at any time just continue to have her confidence of walking the further distances. Patient voiced understanding and was happy that I explained what the MD meant.   Maki Hege L. Manson Passey, MS, NASM, CES

## 2011-09-13 ENCOUNTER — Encounter (HOSPITAL_COMMUNITY)
Admission: RE | Admit: 2011-09-13 | Discharge: 2011-09-13 | Disposition: A | Discharge status: Home / Self Care | Payer: Self-pay | Source: Ambulatory Visit | Attending: Pulmonary Disease | Admitting: Pulmonary Disease

## 2011-09-18 ENCOUNTER — Encounter (HOSPITAL_COMMUNITY)
Admission: RE | Admit: 2011-09-18 | Discharge: 2011-09-18 | Disposition: A | Discharge status: Home / Self Care | Payer: Self-pay | Source: Ambulatory Visit | Attending: Pulmonary Disease | Admitting: Pulmonary Disease

## 2011-09-20 ENCOUNTER — Encounter (HOSPITAL_COMMUNITY)
Admission: RE | Admit: 2011-09-20 | Discharge: 2011-09-20 | Disposition: A | Discharge status: Home / Self Care | Payer: Self-pay | Source: Ambulatory Visit | Attending: Pulmonary Disease | Admitting: Pulmonary Disease

## 2011-09-20 DIAGNOSIS — E662 Morbid (severe) obesity with alveolar hypoventilation: Secondary | ICD-10-CM | POA: Insufficient documentation

## 2011-09-20 DIAGNOSIS — Z5189 Encounter for other specified aftercare: Secondary | ICD-10-CM | POA: Insufficient documentation

## 2011-09-20 DIAGNOSIS — E039 Hypothyroidism, unspecified: Secondary | ICD-10-CM | POA: Insufficient documentation

## 2011-09-20 DIAGNOSIS — R0902 Hypoxemia: Secondary | ICD-10-CM | POA: Insufficient documentation

## 2011-09-20 DIAGNOSIS — G4733 Obstructive sleep apnea (adult) (pediatric): Secondary | ICD-10-CM | POA: Insufficient documentation

## 2011-09-20 DIAGNOSIS — E119 Type 2 diabetes mellitus without complications: Secondary | ICD-10-CM | POA: Insufficient documentation

## 2011-09-25 ENCOUNTER — Encounter (HOSPITAL_COMMUNITY)
Admission: RE | Admit: 2011-09-25 | Discharge: 2011-09-25 | Disposition: A | Discharge status: Home / Self Care | Payer: Self-pay | Source: Ambulatory Visit | Attending: Pulmonary Disease | Admitting: Pulmonary Disease

## 2011-09-27 ENCOUNTER — Encounter (HOSPITAL_COMMUNITY)
Admission: RE | Admit: 2011-09-27 | Discharge: 2011-09-27 | Disposition: A | Discharge status: Home / Self Care | Payer: Self-pay | Source: Ambulatory Visit | Attending: Pulmonary Disease | Admitting: Pulmonary Disease

## 2011-10-02 ENCOUNTER — Encounter (HOSPITAL_COMMUNITY)
Admission: RE | Admit: 2011-10-02 | Discharge: 2011-10-02 | Disposition: A | Discharge status: Home / Self Care | Payer: Self-pay | Source: Ambulatory Visit | Attending: Pulmonary Disease | Admitting: Pulmonary Disease

## 2011-10-04 ENCOUNTER — Encounter (HOSPITAL_COMMUNITY)
Admission: RE | Admit: 2011-10-04 | Discharge: 2011-10-04 | Disposition: A | Discharge status: Home / Self Care | Payer: Self-pay | Source: Ambulatory Visit | Attending: Pulmonary Disease | Admitting: Pulmonary Disease

## 2011-10-08 ENCOUNTER — Ambulatory Visit: Payer: BC Managed Care – PPO | Admitting: Adult Health

## 2011-10-09 ENCOUNTER — Encounter (HOSPITAL_COMMUNITY)
Admission: RE | Admit: 2011-10-09 | Discharge: 2011-10-09 | Disposition: A | Discharge status: Home / Self Care | Payer: Self-pay | Source: Ambulatory Visit | Attending: Pulmonary Disease | Admitting: Pulmonary Disease

## 2011-10-11 ENCOUNTER — Encounter (HOSPITAL_COMMUNITY)
Admission: RE | Admit: 2011-10-11 | Discharge: 2011-10-11 | Disposition: A | Discharge status: Home / Self Care | Payer: Self-pay | Source: Ambulatory Visit | Attending: Pulmonary Disease | Admitting: Pulmonary Disease

## 2011-10-16 ENCOUNTER — Encounter (HOSPITAL_COMMUNITY)
Admission: RE | Admit: 2011-10-16 | Discharge: 2011-10-16 | Disposition: A | Discharge status: Home / Self Care | Payer: Self-pay | Source: Ambulatory Visit | Attending: Pulmonary Disease | Admitting: Pulmonary Disease

## 2011-10-17 DIAGNOSIS — E1142 Type 2 diabetes mellitus with diabetic polyneuropathy: Secondary | ICD-10-CM | POA: Diagnosis not present

## 2011-10-17 DIAGNOSIS — E669 Obesity, unspecified: Secondary | ICD-10-CM | POA: Diagnosis not present

## 2011-10-17 DIAGNOSIS — E785 Hyperlipidemia, unspecified: Secondary | ICD-10-CM | POA: Diagnosis not present

## 2011-10-17 DIAGNOSIS — E039 Hypothyroidism, unspecified: Secondary | ICD-10-CM | POA: Diagnosis not present

## 2011-10-17 DIAGNOSIS — I10 Essential (primary) hypertension: Secondary | ICD-10-CM | POA: Diagnosis not present

## 2011-10-17 DIAGNOSIS — E1149 Type 2 diabetes mellitus with other diabetic neurological complication: Secondary | ICD-10-CM | POA: Diagnosis not present

## 2011-10-18 ENCOUNTER — Encounter (HOSPITAL_COMMUNITY)
Admission: RE | Admit: 2011-10-18 | Discharge: 2011-10-18 | Disposition: A | Discharge status: Home / Self Care | Payer: Self-pay | Source: Ambulatory Visit | Attending: Pulmonary Disease | Admitting: Pulmonary Disease

## 2011-10-23 ENCOUNTER — Encounter (HOSPITAL_COMMUNITY)
Admission: RE | Admit: 2011-10-23 | Discharge: 2011-10-23 | Disposition: A | Discharge status: Home / Self Care | Payer: Self-pay | Source: Ambulatory Visit | Attending: Pulmonary Disease | Admitting: Pulmonary Disease

## 2011-10-23 DIAGNOSIS — G4733 Obstructive sleep apnea (adult) (pediatric): Secondary | ICD-10-CM | POA: Insufficient documentation

## 2011-10-23 DIAGNOSIS — E039 Hypothyroidism, unspecified: Secondary | ICD-10-CM | POA: Insufficient documentation

## 2011-10-23 DIAGNOSIS — R0902 Hypoxemia: Secondary | ICD-10-CM | POA: Insufficient documentation

## 2011-10-23 DIAGNOSIS — Z5189 Encounter for other specified aftercare: Secondary | ICD-10-CM | POA: Insufficient documentation

## 2011-10-23 DIAGNOSIS — E662 Morbid (severe) obesity with alveolar hypoventilation: Secondary | ICD-10-CM | POA: Insufficient documentation

## 2011-10-23 DIAGNOSIS — E119 Type 2 diabetes mellitus without complications: Secondary | ICD-10-CM | POA: Insufficient documentation

## 2011-10-25 ENCOUNTER — Encounter (HOSPITAL_COMMUNITY)
Admission: RE | Admit: 2011-10-25 | Discharge: 2011-10-25 | Disposition: A | Discharge status: Home / Self Care | Payer: Self-pay | Source: Ambulatory Visit | Attending: Pulmonary Disease | Admitting: Pulmonary Disease

## 2011-10-30 ENCOUNTER — Encounter (HOSPITAL_COMMUNITY)
Admission: RE | Admit: 2011-10-30 | Discharge: 2011-10-30 | Disposition: A | Discharge status: Home / Self Care | Payer: Self-pay | Source: Ambulatory Visit | Attending: Pulmonary Disease | Admitting: Pulmonary Disease

## 2011-11-01 ENCOUNTER — Encounter (HOSPITAL_COMMUNITY)
Admission: RE | Admit: 2011-11-01 | Discharge: 2011-11-01 | Disposition: A | Discharge status: Home / Self Care | Payer: Self-pay | Source: Ambulatory Visit | Attending: Pulmonary Disease | Admitting: Pulmonary Disease

## 2011-11-06 ENCOUNTER — Encounter (HOSPITAL_COMMUNITY)
Admission: RE | Admit: 2011-11-06 | Discharge: 2011-11-06 | Disposition: A | Discharge status: Home / Self Care | Payer: Self-pay | Source: Ambulatory Visit | Attending: Pulmonary Disease | Admitting: Pulmonary Disease

## 2011-11-06 NOTE — Progress Notes (Signed)
Brief Nutrition Note Spoke with pt. Pt frustrated re: stalled wt loss. Per discussion with pt, MD increased her insulin from 10 to 12 units, which may be contributing to wt loss plateau. Pt's A1c was "7.1 I think." Pt encouraged to write down the things that she is currently able to do that she was physically unable to do last year. Pt was being escorted to [facility]monary [facility]hab in a wheel chair and is now walking to valet parking. Pt is now going up and down her stairs at home with a laundry basket. Pt started taking Zumba once a week. Pt encouraged to continue current diet and exercise plan. This Clinical research associate suggested pt may want to track her caloric intake for 3-5 days and this writer will evaluate nutrition intake if pt wants an evaluation of her food log. Pt expressed understanding. Continue client-centered nutrition education by RD as part of interdisciplinary care.  Monitor and evaluate progress toward nutrition goal with team.

## 2011-11-08 ENCOUNTER — Encounter (HOSPITAL_COMMUNITY)
Admission: RE | Admit: 2011-11-08 | Discharge: 2011-11-08 | Disposition: A | Discharge status: Home / Self Care | Payer: Self-pay | Source: Ambulatory Visit | Attending: Pulmonary Disease | Admitting: Pulmonary Disease

## 2011-11-13 ENCOUNTER — Encounter (HOSPITAL_COMMUNITY)
Admission: RE | Admit: 2011-11-13 | Discharge: 2011-11-13 | Disposition: A | Discharge status: Home / Self Care | Payer: Self-pay | Source: Ambulatory Visit | Attending: Pulmonary Disease | Admitting: Pulmonary Disease

## 2011-11-15 ENCOUNTER — Encounter (HOSPITAL_COMMUNITY)
Admission: RE | Admit: 2011-11-15 | Discharge: 2011-11-15 | Disposition: A | Discharge status: Home / Self Care | Payer: Self-pay | Source: Ambulatory Visit | Attending: Pulmonary Disease | Admitting: Pulmonary Disease

## 2011-11-20 ENCOUNTER — Encounter (HOSPITAL_COMMUNITY)
Admission: RE | Admit: 2011-11-20 | Discharge: 2011-11-20 | Disposition: A | Discharge status: Home / Self Care | Payer: Self-pay | Source: Ambulatory Visit | Attending: Pulmonary Disease | Admitting: Pulmonary Disease

## 2011-11-20 DIAGNOSIS — G4733 Obstructive sleep apnea (adult) (pediatric): Secondary | ICD-10-CM | POA: Insufficient documentation

## 2011-11-20 DIAGNOSIS — Z5189 Encounter for other specified aftercare: Secondary | ICD-10-CM | POA: Insufficient documentation

## 2011-11-20 DIAGNOSIS — R0902 Hypoxemia: Secondary | ICD-10-CM | POA: Insufficient documentation

## 2011-11-20 DIAGNOSIS — E119 Type 2 diabetes mellitus without complications: Secondary | ICD-10-CM | POA: Insufficient documentation

## 2011-11-20 DIAGNOSIS — E662 Morbid (severe) obesity with alveolar hypoventilation: Secondary | ICD-10-CM | POA: Insufficient documentation

## 2011-11-20 DIAGNOSIS — E039 Hypothyroidism, unspecified: Secondary | ICD-10-CM | POA: Insufficient documentation

## 2011-11-22 ENCOUNTER — Encounter (HOSPITAL_COMMUNITY): Payer: Self-pay

## 2011-11-27 ENCOUNTER — Encounter (HOSPITAL_COMMUNITY)
Admission: RE | Admit: 2011-11-27 | Discharge: 2011-11-27 | Disposition: A | Discharge status: Home / Self Care | Payer: Self-pay | Source: Ambulatory Visit | Attending: Pulmonary Disease | Admitting: Pulmonary Disease

## 2011-11-29 ENCOUNTER — Encounter (HOSPITAL_COMMUNITY)
Admission: RE | Admit: 2011-11-29 | Discharge: 2011-11-29 | Disposition: A | Discharge status: Home / Self Care | Payer: Self-pay | Source: Ambulatory Visit | Attending: Pulmonary Disease | Admitting: Pulmonary Disease

## 2011-12-04 ENCOUNTER — Encounter (HOSPITAL_COMMUNITY)
Admission: RE | Admit: 2011-12-04 | Discharge: 2011-12-04 | Disposition: A | Discharge status: Home / Self Care | Payer: Self-pay | Source: Ambulatory Visit | Attending: Pulmonary Disease | Admitting: Pulmonary Disease

## 2011-12-06 ENCOUNTER — Encounter (HOSPITAL_COMMUNITY)
Admission: RE | Admit: 2011-12-06 | Discharge: 2011-12-06 | Disposition: A | Discharge status: Home / Self Care | Payer: Self-pay | Source: Ambulatory Visit | Attending: Pulmonary Disease | Admitting: Pulmonary Disease

## 2011-12-11 ENCOUNTER — Encounter (HOSPITAL_COMMUNITY)
Admission: RE | Admit: 2011-12-11 | Discharge: 2011-12-11 | Disposition: A | Discharge status: Home / Self Care | Payer: Self-pay | Source: Ambulatory Visit | Attending: Pulmonary Disease | Admitting: Pulmonary Disease

## 2011-12-12 ENCOUNTER — Encounter: Payer: Self-pay | Admitting: Pulmonary Disease

## 2011-12-12 ENCOUNTER — Ambulatory Visit (INDEPENDENT_AMBULATORY_CARE_PROVIDER_SITE_OTHER): Payer: BC Managed Care – PPO | Admitting: Pulmonary Disease

## 2011-12-12 VITALS — BP 124/82 | HR 73 | Temp 98.0°F | Ht 61.0 in | Wt 325.4 lb

## 2011-12-12 DIAGNOSIS — E662 Morbid (severe) obesity with alveolar hypoventilation: Secondary | ICD-10-CM

## 2011-12-12 NOTE — Patient Instructions (Addendum)
BiPAP is working well for you Give me a shout when you get under 300 !

## 2011-12-12 NOTE — Progress Notes (Signed)
  Subjective:    Patient ID: Nina Howell, female    DOB: May 19, 1941, 71 y.o.   MRN: [id number]  HPI  Primary Provider : Hayden Rasmussen, NP (Dr. Kellie Shropshire)   70/F, morbidly obese for FU of obstructive sleep apnea/ OHS .  Adm 04/10/10 with worsening dyspnea & hypoxia, VQ neg, BNP 103, diuresed, ABG 7.43/63/89/97% on 2 L, discharged on 2 L & Bilevel (empiric) ? 14/8  PSG subsequently showed moderate obstructive sleep apnea with AHi 15/h.  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    12/12/2011 1/13  hysterescopy- dnc and polyp resection performed under spinal anesthesia , uneventful Very enthusiastic about rehab  Compliant with BiPAP every night, has come off O2 , mask ok , pressure ok, no dryness 1/13Desaturated to 89% on 2nd lap in the office.  Hb a1c 7.1 - is careful about diet    Review of Systems Patient denies significant dyspnea,cough, hemoptysis,  chest pain, palpitations, pedal edema, orthopnea, paroxysmal nocturnal dyspnea, lightheadedness, nausea, vomiting, abdominal or  leg pains      Objective:   Physical Exam  Gen. Pleasant, obese, in no distress ENT - no lesions, no post nasal drip Neck: No JVD, no thyromegaly, no carotid bruits Lungs: no use of accessory muscles, no dullness to percussion, decreased without rales or rhonchi  Cardiovascular: Rhythm regular, heart sounds  normal, no murmurs or gallops, no peripheral edema Musculoskeletal: No deformities, no cyanosis or clubbing , no tremors        Assessment & Plan:

## 2011-12-13 ENCOUNTER — Encounter (HOSPITAL_COMMUNITY)
Admission: RE | Admit: 2011-12-13 | Discharge: 2011-12-13 | Disposition: A | Discharge status: Home / Self Care | Payer: Self-pay | Source: Ambulatory Visit | Attending: Pulmonary Disease | Admitting: Pulmonary Disease

## 2011-12-13 NOTE — Assessment & Plan Note (Signed)
Ct bipap 14/8 She seems Motivated to lose more wt If < 30, will restudy to see if still needs PAP. Weight loss encouraged, compliance with goal of at least 4-6 hrs every night is the expectation. Advised against medications with sedative side effects Cautioned against driving when sleepy - understanding that sleepiness will vary on a day to day basis

## 2011-12-18 ENCOUNTER — Encounter (HOSPITAL_COMMUNITY)
Admission: RE | Admit: 2011-12-18 | Discharge: 2011-12-18 | Disposition: A | Discharge status: Home / Self Care | Payer: Self-pay | Source: Ambulatory Visit | Attending: Pulmonary Disease | Admitting: Pulmonary Disease

## 2011-12-19 ENCOUNTER — Telehealth: Payer: Self-pay | Admitting: Pulmonary Disease

## 2011-12-19 NOTE — Telephone Encounter (Signed)
This has been placed in RA look at area. Will forward to RA so he is aware of this

## 2011-12-20 ENCOUNTER — Encounter (HOSPITAL_COMMUNITY)
Admission: RE | Admit: 2011-12-20 | Discharge: 2011-12-20 | Disposition: A | Discharge status: Home / Self Care | Payer: Self-pay | Source: Ambulatory Visit | Attending: Pulmonary Disease | Admitting: Pulmonary Disease

## 2011-12-20 DIAGNOSIS — E662 Morbid (severe) obesity with alveolar hypoventilation: Secondary | ICD-10-CM | POA: Insufficient documentation

## 2011-12-20 DIAGNOSIS — Z5189 Encounter for other specified aftercare: Secondary | ICD-10-CM | POA: Insufficient documentation

## 2011-12-20 DIAGNOSIS — G4733 Obstructive sleep apnea (adult) (pediatric): Secondary | ICD-10-CM | POA: Insufficient documentation

## 2011-12-20 DIAGNOSIS — R0902 Hypoxemia: Secondary | ICD-10-CM | POA: Insufficient documentation

## 2011-12-20 DIAGNOSIS — E119 Type 2 diabetes mellitus without complications: Secondary | ICD-10-CM | POA: Insufficient documentation

## 2011-12-20 DIAGNOSIS — E039 Hypothyroidism, unspecified: Secondary | ICD-10-CM | POA: Insufficient documentation

## 2011-12-20 NOTE — Telephone Encounter (Signed)
Form has been filled out and faxed back to [employer]

## 2011-12-25 ENCOUNTER — Encounter (HOSPITAL_COMMUNITY)
Admission: RE | Admit: 2011-12-25 | Discharge: 2011-12-25 | Disposition: A | Discharge status: Home / Self Care | Payer: Self-pay | Source: Ambulatory Visit | Attending: Pulmonary Disease | Admitting: Pulmonary Disease

## 2011-12-27 ENCOUNTER — Encounter (HOSPITAL_COMMUNITY)
Admission: RE | Admit: 2011-12-27 | Discharge: 2011-12-27 | Disposition: A | Discharge status: Home / Self Care | Payer: Self-pay | Source: Ambulatory Visit | Attending: Pulmonary Disease | Admitting: Pulmonary Disease

## 2012-01-01 ENCOUNTER — Encounter (HOSPITAL_COMMUNITY)
Admission: RE | Admit: 2012-01-01 | Discharge: 2012-01-01 | Disposition: A | Discharge status: Home / Self Care | Payer: Self-pay | Source: Ambulatory Visit | Attending: Pulmonary Disease | Admitting: Pulmonary Disease

## 2012-01-03 ENCOUNTER — Encounter (HOSPITAL_COMMUNITY)
Admission: RE | Admit: 2012-01-03 | Discharge: 2012-01-03 | Disposition: A | Discharge status: Home / Self Care | Payer: Self-pay | Source: Ambulatory Visit | Attending: Pulmonary Disease | Admitting: Pulmonary Disease

## 2012-01-03 NOTE — Progress Notes (Addendum)
Nina Howell 71 y.o. female Nutrition Note Wt Readings from Last 3 Encounters:  12/12/11 325 lb 6.4 oz (147.6 kg)  06/18/11 316 lb (143.337 kg)  06/18/11 316 lb (143.337 kg)   Spoke with pt. Pt discouraged due to 9 lb wt gain over the past 7 months. Pt's wt is down 19# over the past 10 months. Pt states she is frustrated re: the different messages she is getting from different MD's. Pt reports she had a stressful appt with an MD yesterday "that only focused on my wt and the fact that I stopped taking the statin drug." Per discussion, pt stopped taking the statin medication due to muscle pain/weakness, which resolved with statin med cessation. Pt states she was told to not eat "meat 3 days a week." This writer asked pt if she wanted to decrease meat consumed. Pt does not want to decrease meat consumption at this time. Pt's husband is a Midwife and "it's difficult for Nina Howell to understand why I shouldn't eat chicken or fish.". Pt encouraged by this writer to seek another MD if she was unhappy. Per pt, she has been on Victoza for 9 months, which has not helped to promote wt loss. Pt considering stopping Victoza. Pt wants to try taking insulin at bedtime given "my blood sugars are better when I take it then." Pt states her fasting CBG's 130-160 mg/dL when she takes insulin with her dinner meal. Pt states Dr. Sharl Ma told her to hold insulin in the am of exercise days. Holding am insulin has helped to prevent hypoglycemia during/after exercise. Pt has limited dietary variety for wt loss over more than a year. ? If pt may be bored with lack of variety. Pt to write down foods that she would like to have and has not allowed herself for wt loss purposes. Pt expressed understanding of the information reviewed.  Nutrition Diagnosis   Obesity related to excessive energy intake as evidenced by a BMI of 61 Nutrition Rx/Est. Daily Nutrition Needs for: ? wt loss 1750-2250 Kcal 120-145 gm protein   1500 mg or less  sodium     250 gm CHO Nutrition Intervention   Pt's individual nutrition plan and goals reviewed with pt.   Handout given: A Guide to [employer] for a Healthy Heart.    Continual client-centered nutrition education by RD, as part of interdisciplinary care. Goal(s) 1. Identify food quantities necessary to achieve wt loss of  -2# per week  2. Use pre-/post meal and/or exercise CBG's and A1c to determine whether adjustments in food/meal planning will be beneficial or if any meds need to be combined with nutrition therapy. Monitor and Evaluate progress toward nutrition goal with team.

## 2012-01-08 ENCOUNTER — Encounter (HOSPITAL_COMMUNITY)
Admission: RE | Admit: 2012-01-08 | Discharge: 2012-01-08 | Disposition: A | Discharge status: Home / Self Care | Payer: Self-pay | Source: Ambulatory Visit | Attending: Pulmonary Disease | Admitting: Pulmonary Disease

## 2012-01-10 ENCOUNTER — Encounter (HOSPITAL_COMMUNITY)
Admission: RE | Admit: 2012-01-10 | Discharge: 2012-01-10 | Disposition: A | Discharge status: Home / Self Care | Payer: Self-pay | Source: Ambulatory Visit | Attending: Pulmonary Disease | Admitting: Pulmonary Disease

## 2012-01-15 ENCOUNTER — Encounter (HOSPITAL_COMMUNITY)
Admission: RE | Admit: 2012-01-15 | Discharge: 2012-01-15 | Disposition: A | Discharge status: Home / Self Care | Payer: Self-pay | Source: Ambulatory Visit | Attending: Pulmonary Disease | Admitting: Pulmonary Disease

## 2012-01-17 ENCOUNTER — Encounter (HOSPITAL_COMMUNITY)
Admission: RE | Admit: 2012-01-17 | Discharge: 2012-01-17 | Disposition: A | Discharge status: Home / Self Care | Payer: Self-pay | Source: Ambulatory Visit | Attending: Pulmonary Disease | Admitting: Pulmonary Disease

## 2012-01-22 ENCOUNTER — Encounter (HOSPITAL_COMMUNITY)
Admission: RE | Admit: 2012-01-22 | Discharge: 2012-01-22 | Disposition: A | Discharge status: Home / Self Care | Payer: Self-pay | Source: Ambulatory Visit | Attending: Pulmonary Disease | Admitting: Pulmonary Disease

## 2012-01-22 DIAGNOSIS — R0902 Hypoxemia: Secondary | ICD-10-CM | POA: Insufficient documentation

## 2012-01-22 DIAGNOSIS — E119 Type 2 diabetes mellitus without complications: Secondary | ICD-10-CM | POA: Insufficient documentation

## 2012-01-22 DIAGNOSIS — E039 Hypothyroidism, unspecified: Secondary | ICD-10-CM | POA: Insufficient documentation

## 2012-01-22 DIAGNOSIS — E662 Morbid (severe) obesity with alveolar hypoventilation: Secondary | ICD-10-CM | POA: Insufficient documentation

## 2012-01-22 DIAGNOSIS — G4733 Obstructive sleep apnea (adult) (pediatric): Secondary | ICD-10-CM | POA: Insufficient documentation

## 2012-01-22 DIAGNOSIS — Z5189 Encounter for other specified aftercare: Secondary | ICD-10-CM | POA: Insufficient documentation

## 2012-01-23 DIAGNOSIS — E669 Obesity, unspecified: Secondary | ICD-10-CM | POA: Diagnosis not present

## 2012-01-23 DIAGNOSIS — E1142 Type 2 diabetes mellitus with diabetic polyneuropathy: Secondary | ICD-10-CM | POA: Diagnosis not present

## 2012-01-23 DIAGNOSIS — E785 Hyperlipidemia, unspecified: Secondary | ICD-10-CM | POA: Diagnosis not present

## 2012-01-23 DIAGNOSIS — E1149 Type 2 diabetes mellitus with other diabetic neurological complication: Secondary | ICD-10-CM | POA: Diagnosis not present

## 2012-01-23 DIAGNOSIS — E039 Hypothyroidism, unspecified: Secondary | ICD-10-CM | POA: Diagnosis not present

## 2012-01-23 DIAGNOSIS — I10 Essential (primary) hypertension: Secondary | ICD-10-CM | POA: Diagnosis not present

## 2012-01-24 ENCOUNTER — Encounter (HOSPITAL_COMMUNITY)
Admission: RE | Admit: 2012-01-24 | Discharge: 2012-01-24 | Disposition: A | Discharge status: Home / Self Care | Payer: Self-pay | Source: Ambulatory Visit | Attending: Pulmonary Disease | Admitting: Pulmonary Disease

## 2012-01-29 ENCOUNTER — Encounter (HOSPITAL_COMMUNITY)
Admission: RE | Admit: 2012-01-29 | Discharge: 2012-01-29 | Disposition: A | Discharge status: Home / Self Care | Payer: Self-pay | Source: Ambulatory Visit | Attending: Pulmonary Disease | Admitting: Pulmonary Disease

## 2012-01-31 ENCOUNTER — Encounter (HOSPITAL_COMMUNITY)
Admission: RE | Admit: 2012-01-31 | Discharge: 2012-01-31 | Disposition: A | Discharge status: Home / Self Care | Payer: Self-pay | Source: Ambulatory Visit | Attending: Pulmonary Disease | Admitting: Pulmonary Disease

## 2012-02-05 ENCOUNTER — Encounter (HOSPITAL_COMMUNITY)
Admission: RE | Admit: 2012-02-05 | Discharge: 2012-02-05 | Disposition: A | Discharge status: Home / Self Care | Payer: Self-pay | Source: Ambulatory Visit | Attending: Pulmonary Disease | Admitting: Pulmonary Disease

## 2012-02-06 DIAGNOSIS — L2089 Other atopic dermatitis: Secondary | ICD-10-CM | POA: Diagnosis not present

## 2012-02-06 DIAGNOSIS — L253 Unspecified contact dermatitis due to other chemical products: Secondary | ICD-10-CM | POA: Diagnosis not present

## 2012-02-07 ENCOUNTER — Encounter (HOSPITAL_COMMUNITY)
Admission: RE | Admit: 2012-02-07 | Discharge: 2012-02-07 | Disposition: A | Discharge status: Home / Self Care | Payer: Self-pay | Source: Ambulatory Visit | Attending: Pulmonary Disease | Admitting: Pulmonary Disease

## 2012-02-12 ENCOUNTER — Encounter (HOSPITAL_COMMUNITY)
Admission: RE | Admit: 2012-02-12 | Discharge: 2012-02-12 | Disposition: A | Discharge status: Home / Self Care | Payer: Self-pay | Source: Ambulatory Visit | Attending: Pulmonary Disease | Admitting: Pulmonary Disease

## 2012-02-14 ENCOUNTER — Encounter (HOSPITAL_COMMUNITY)
Admission: RE | Admit: 2012-02-14 | Discharge: 2012-02-14 | Disposition: A | Discharge status: Home / Self Care | Payer: Self-pay | Source: Ambulatory Visit | Attending: Pulmonary Disease | Admitting: Pulmonary Disease

## 2012-02-19 ENCOUNTER — Encounter (HOSPITAL_COMMUNITY)
Admission: RE | Admit: 2012-02-19 | Discharge: 2012-02-19 | Disposition: A | Discharge status: Home / Self Care | Payer: Self-pay | Source: Ambulatory Visit | Attending: Pulmonary Disease | Admitting: Pulmonary Disease

## 2012-02-19 DIAGNOSIS — E119 Type 2 diabetes mellitus without complications: Secondary | ICD-10-CM | POA: Insufficient documentation

## 2012-02-19 DIAGNOSIS — R0902 Hypoxemia: Secondary | ICD-10-CM | POA: Insufficient documentation

## 2012-02-19 DIAGNOSIS — E039 Hypothyroidism, unspecified: Secondary | ICD-10-CM | POA: Insufficient documentation

## 2012-02-19 DIAGNOSIS — Z5189 Encounter for other specified aftercare: Secondary | ICD-10-CM | POA: Insufficient documentation

## 2012-02-19 DIAGNOSIS — E662 Morbid (severe) obesity with alveolar hypoventilation: Secondary | ICD-10-CM | POA: Insufficient documentation

## 2012-02-19 DIAGNOSIS — G4733 Obstructive sleep apnea (adult) (pediatric): Secondary | ICD-10-CM | POA: Insufficient documentation

## 2012-02-21 ENCOUNTER — Encounter (HOSPITAL_COMMUNITY)
Admission: RE | Admit: 2012-02-21 | Discharge: 2012-02-21 | Disposition: A | Discharge status: Home / Self Care | Payer: Self-pay | Source: Ambulatory Visit | Attending: Pulmonary Disease | Admitting: Pulmonary Disease

## 2012-02-25 DIAGNOSIS — E119 Type 2 diabetes mellitus without complications: Secondary | ICD-10-CM | POA: Diagnosis not present

## 2012-02-25 DIAGNOSIS — I10 Essential (primary) hypertension: Secondary | ICD-10-CM | POA: Diagnosis not present

## 2012-02-25 DIAGNOSIS — E039 Hypothyroidism, unspecified: Secondary | ICD-10-CM | POA: Diagnosis not present

## 2012-02-26 ENCOUNTER — Encounter (HOSPITAL_COMMUNITY): Payer: Self-pay

## 2012-02-28 ENCOUNTER — Encounter (HOSPITAL_COMMUNITY)
Admission: RE | Admit: 2012-02-28 | Discharge: 2012-02-28 | Disposition: A | Discharge status: Home / Self Care | Payer: Self-pay | Source: Ambulatory Visit | Attending: Pulmonary Disease | Admitting: Pulmonary Disease

## 2012-03-04 ENCOUNTER — Encounter (HOSPITAL_COMMUNITY)
Admission: RE | Admit: 2012-03-04 | Discharge: 2012-03-04 | Disposition: A | Discharge status: Home / Self Care | Payer: Self-pay | Source: Ambulatory Visit | Attending: Pulmonary Disease | Admitting: Pulmonary Disease

## 2012-03-06 ENCOUNTER — Encounter (HOSPITAL_COMMUNITY)
Admission: RE | Admit: 2012-03-06 | Discharge: 2012-03-06 | Disposition: A | Discharge status: Home / Self Care | Payer: Self-pay | Source: Ambulatory Visit | Attending: Pulmonary Disease | Admitting: Pulmonary Disease

## 2012-03-11 ENCOUNTER — Encounter (HOSPITAL_COMMUNITY)
Admission: RE | Admit: 2012-03-11 | Discharge: 2012-03-11 | Disposition: A | Discharge status: Home / Self Care | Payer: Self-pay | Source: Ambulatory Visit | Attending: Pulmonary Disease | Admitting: Pulmonary Disease

## 2012-03-13 ENCOUNTER — Encounter (HOSPITAL_COMMUNITY)
Admission: RE | Admit: 2012-03-13 | Discharge: 2012-03-13 | Disposition: A | Discharge status: Home / Self Care | Payer: Self-pay | Source: Ambulatory Visit | Attending: Pulmonary Disease | Admitting: Pulmonary Disease

## 2012-03-18 ENCOUNTER — Encounter (HOSPITAL_COMMUNITY)
Admission: RE | Admit: 2012-03-18 | Discharge: 2012-03-18 | Disposition: A | Discharge status: Home / Self Care | Payer: Self-pay | Source: Ambulatory Visit | Attending: Pulmonary Disease | Admitting: Pulmonary Disease

## 2012-03-20 ENCOUNTER — Encounter (HOSPITAL_COMMUNITY): Payer: Self-pay

## 2012-03-21 ENCOUNTER — Observation Stay (HOSPITAL_COMMUNITY)
Admission: EM | Admit: 2012-03-21 | Discharge: 2012-03-21 | Disposition: A | Discharge status: Home / Self Care | Payer: BC Managed Care – PPO | Attending: Emergency Medicine | Admitting: Emergency Medicine

## 2012-03-21 ENCOUNTER — Encounter (HOSPITAL_COMMUNITY): Payer: Self-pay | Admitting: *Deleted

## 2012-03-21 DIAGNOSIS — I509 Heart failure, unspecified: Secondary | ICD-10-CM | POA: Insufficient documentation

## 2012-03-21 DIAGNOSIS — E876 Hypokalemia: Secondary | ICD-10-CM | POA: Insufficient documentation

## 2012-03-21 DIAGNOSIS — E119 Type 2 diabetes mellitus without complications: Secondary | ICD-10-CM | POA: Insufficient documentation

## 2012-03-21 DIAGNOSIS — Z8669 Personal history of other diseases of the nervous system and sense organs: Secondary | ICD-10-CM | POA: Insufficient documentation

## 2012-03-21 DIAGNOSIS — Z79899 Other long term (current) drug therapy: Secondary | ICD-10-CM | POA: Insufficient documentation

## 2012-03-21 DIAGNOSIS — M25569 Pain in unspecified knee: Principal | ICD-10-CM | POA: Insufficient documentation

## 2012-03-21 DIAGNOSIS — Z872 Personal history of diseases of the skin and subcutaneous tissue: Secondary | ICD-10-CM | POA: Insufficient documentation

## 2012-03-21 DIAGNOSIS — M79609 Pain in unspecified limb: Secondary | ICD-10-CM

## 2012-03-21 DIAGNOSIS — Z7982 Long term (current) use of aspirin: Secondary | ICD-10-CM | POA: Insufficient documentation

## 2012-03-21 DIAGNOSIS — I8390 Asymptomatic varicose veins of unspecified lower extremity: Secondary | ICD-10-CM | POA: Insufficient documentation

## 2012-03-21 DIAGNOSIS — E039 Hypothyroidism, unspecified: Secondary | ICD-10-CM | POA: Insufficient documentation

## 2012-03-21 DIAGNOSIS — Z794 Long term (current) use of insulin: Secondary | ICD-10-CM | POA: Insufficient documentation

## 2012-03-21 HISTORY — DX: Presence of (intrauterine) contraceptive device: Z97.5

## 2012-03-21 LAB — CBC WITH DIFFERENTIAL/PLATELET
Basophils Absolute: 0.1 10*3/uL (ref 0.0–0.1)
Basophils Relative: 0 % (ref 0–1)
Eosinophils Absolute: 0.3 10*3/uL (ref 0.0–0.7)
Eosinophils Relative: 3 % (ref 0–5)
Lymphs Abs: 2.9 10*3/uL (ref 0.7–4.0)
MCH: 27.8 pg (ref 26.0–34.0)
MCHC: 32.4 g/dL (ref 30.0–36.0)
MCV: 86 fL (ref 78.0–100.0)
Platelets: 311 10*3/uL (ref 150–400)
RDW: 15.7 % — ABNORMAL HIGH (ref 11.5–15.5)

## 2012-03-21 LAB — BASIC METABOLIC PANEL
Calcium: 9 mg/dL (ref 8.4–10.5)
GFR calc Af Amer: >90 mL/min (ref >90)
GFR calc non Af Amer: 89 mL/min — ABNORMAL LOW (ref >90)
Glucose, Bld: 137 mg/dL — ABNORMAL HIGH (ref 70–99)
Sodium: 138 mEq/L (ref 135–145)

## 2012-03-21 LAB — PROTIME-INR: INR: 0.92 (ref 0.00–1.49)

## 2012-03-21 MED ORDER — ENOXAPARIN SODIUM 100 MG/ML ~~LOC~~ SOLN: 1.0000 mg/kg | Freq: Once | SUBCUTANEOUS | Status: DC

## 2012-03-21 MED ORDER — MORPHINE SULFATE 4 MG/ML IJ SOLN: 4.0000 mg | INTRAMUSCULAR | Status: DC | PRN

## 2012-03-21 MED ORDER — ENOXAPARIN SODIUM 150 MG/ML ~~LOC~~ SOLN
150.0000 mg | Freq: Once | SUBCUTANEOUS | Status: AC
  Administered 2012-03-21: 150 mg via SUBCUTANEOUS
  Filled 2012-03-21 (×2): qty 1

## 2012-03-21 MED ORDER — ACETAMINOPHEN 325 MG PO TABS: 650.0000 mg | ORAL_TABLET | ORAL | Status: DC | PRN

## 2012-03-21 MED ORDER — OXYCODONE-ACETAMINOPHEN 5-325 MG PO TABS
1.0000 | ORAL_TABLET | Freq: Once | ORAL | Status: AC
  Administered 2012-03-21: 1 via ORAL
  Filled 2012-03-21: qty 1

## 2012-03-21 MED ORDER — OXYCODONE-ACETAMINOPHEN 5-325 MG PO TABS: 1.0000 | ORAL_TABLET | Freq: Once | ORAL | Status: DC

## 2012-03-21 MED ORDER — OXYCODONE-ACETAMINOPHEN 5-325 MG PO TABS
2.0000 | ORAL_TABLET | Freq: Once | ORAL | Status: AC
  Administered 2012-03-21: 2 via ORAL
  Filled 2012-03-21: qty 2

## 2012-03-21 NOTE — ED Notes (Signed)
Pt assisted back to room from bathroom 

## 2012-03-21 NOTE — ED Notes (Signed)
Pt assisted to bathroom

## 2012-03-21 NOTE — Progress Notes (Signed)
*  Preliminary Results* Right lower extremity venous duplex completed. There is no obvious evidence of right lower extremity deep vein thrombosis. There was limited visualization of the mid to distal right femoral vein, therefore deep vein thrombosis cannot be excluded in all segments. Preliminary results discussed with Jacki Cones, RN.  03/21/2012 8:43 AM Gertie Fey, RDMS, RDCS

## 2012-03-21 NOTE — ED Notes (Signed)
Patient states pain started in right knee early Thursday morning and pain increased throughout day. Pain kept patient awake and has been unable to sleep.

## 2012-03-21 NOTE — ED Notes (Signed)
Pt has moderate swelling to the  right ankle and mild swelling to the left ankle/ pt c/o pain while ambulating behind the right knee/ skin is warm to touch/ non pitting.

## 2012-03-21 NOTE — ED Provider Notes (Signed)
Preliminary reading did not reveal DVT.  Plan will treat pain and have follow with PCP?  Nelia Shi, MD 03/21/12 1054

## 2012-03-21 NOTE — ED Provider Notes (Signed)
History     CSN: [id number]  Arrival date & time 03/21/12  0218   First MD Initiated Contact with Patient 03/21/12 0451      Chief Complaint  Patient presents with  . Knee Pain    (Consider location/radiation/quality/duration/timing/severity/associated sxs/prior treatment) Patient is a 71 y.o. female presenting with knee pain. The history is provided by the patient.  Knee Pain  She noticed onset yesterday morning of pain in the lateral aspect of the right knee and proximal calf. Pain is severe and she rates it 8/10. It is worse with movement and weightbearing. It is somewhat better with rest. She denies any, or in any unusual activity. She is going through cardiac and pulmonary rehabilitation several times a week. She denies chest pain or dyspnea and denies nausea or vomiting. She's not noticed any change in leg swelling.  Past Medical History  Diagnosis Date  . Cellulitis     ABDOMINAL WALL  . CHF (congestive heart failure)     DECOMPENSATED SYSTOLIC CHF  . Hypoventilation   . Hypokalemia   . CHF (congestive heart failure)   . Diabetes mellitus, type 2   . Hypothyroidism   . Varicose veins   . Morbid obesity   . IUD (intrauterine device) in place     Past Surgical History  Procedure Date  . Shoulder surgery 2009    RIGHT SHOULDER  . Cardiac catheterization 2011    pt states "clean report"    Family History  Problem Relation Age of Onset  . Cancer Mother     throat  . Diabetes Father   . Leukemia Father     History  Substance Use Topics  . Smoking status: Never Smoker   . Smokeless tobacco: Never Used  . Alcohol Use: No    OB History    Grav Para Term Preterm Abortions TAB SAB Ect Mult Living                  Review of Systems  All other systems reviewed and are negative.    Allergies  Cephalexin and Cephalosporins  Home Medications   Current Outpatient Rx  Name Route Sig Dispense Refill  . AMLODIPINE BESYLATE 5 MG PO TABS Oral Take 5 mg by  mouth daily.      . ASPIRIN 81 MG PO TABS Oral Take 81 mg by mouth daily.    . ATORVASTATIN CALCIUM 10 MG PO TABS Oral Take 20 mg by mouth every other day.     Marland Kitchen VITAMIN D3 1000 UNITS PO CAPS Oral Take 1 tablet by mouth daily.      Marland Kitchen CRANBERRY PO Oral Take 3 tablets by mouth daily.     . CYANOCOBALAMIN 1000 MCG PO TABS  4,200 mcg. 2500 mcg daily    . FLAX SEED OIL PO Oral Take 1 tablet by mouth daily.    . FUROSEMIDE 40 MG PO TABS Oral Take 40 mg by mouth 2 (two) times daily.     . INSULIN ASPART PROT & ASPART (70-30) 100 UNIT/ML [place] SUSP Subcutaneous Inject 12 Units into the skin 2 (two) times daily with a meal. 12 units in the am and 12 units in the pm    . LEVOTHYROXINE SODIUM 50 MCG PO TABS Oral Take 50 mcg by mouth daily.      Marland Kitchen LIRAGLUTIDE 18 MG/3ML  SOLN  1.8 mg daily. 1.2 mg once a day    . METFORMIN HCL 1000 MG PO TABS Oral Take 1,000  mg by mouth 2 (two) times daily with a meal.    . MULTIVITAMINS PO CAPS Oral Take 1 capsule by mouth daily.      Marland Kitchen POTASSIUM CHLORIDE CRYS ER 20 MEQ PO TBCR Oral Take 20 mEq by mouth 3 (three) times daily.      Marland Kitchen PRESCRIPTION MEDICATION Topical Apply 1 application topically daily as needed. Talconex for dry skin on scalp    . SERTRALINE HCL 50 MG PO TABS Oral Take 50 mg by mouth at bedtime.       BP 125/74  Pulse 62  Temp 98 F (36.7 C) (Oral)  Resp 16  SpO2 94%  Physical Exam  Nursing note and vitals reviewed. Morbidly obese 71 year old female, resting comfortably and in no acute distress. Vital signs are normal. Oxygen saturation is 94%, which is normal. Head is normocephalic and atraumatic. PERRLA, EOMI. Oropharynx is clear. Neck is nontender and supple without adenopathy or JVD. Back is nontender and there is no CVA tenderness. Lungs are clear without rales, wheezes, or rhonchi. Chest is nontender. Heart has regular rate and rhythm without murmur. Abdomen is soft, flat, nontender without masses or hepatosplenomegaly and peristalsis is  normoactive. Extremities have 1+ pitting edema. Right calf circumference is 3 cm greater than the left calf circumference. Venous varicosities are noted bilaterally but they are much more prominent on the right. There is tenderness to palpation over the lateral aspect of the proximal right calf. There no cords palpable there is no erythema or warmth. Skin is warm and dry without rash. Neurologic: Mental status is normal, cranial nerves are intact, there are no motor or sensory deficits.   ED Course  Procedures (including critical care time)  Results for orders placed during the hospital encounter of 03/21/12  CBC WITH DIFFERENTIAL      Component Value Range   WBC 11.5 (*) 4.0 - 10.5 K/uL   RBC 3.99  3.87 - 5.11 MIL/uL   Hemoglobin 11.1 (*) 12.0 - 15.0 g/dL   HCT 16.1 (*) 09.6 - 04.5 %   MCV 86.0  78.0 - 100.0 fL   MCH 27.8  26.0 - 34.0 pg   MCHC 32.4  30.0 - 36.0 g/dL   RDW 40.9 (*) 81.1 - 91.4 %   Platelets 311  150 - 400 K/uL   Neutrophils Relative 65  43 - 77 %   Neutro Abs 7.4  1.7 - 7.7 K/uL   Lymphocytes Relative 25  12 - 46 %   Lymphs Abs 2.9  0.7 - 4.0 K/uL   Monocytes Relative 7  3 - 12 %   Monocytes Absolute 0.8  0.1 - 1.0 K/uL   Eosinophils Relative 3  0 - 5 %   Eosinophils Absolute 0.3  0.0 - 0.7 K/uL   Basophils Relative 0  0 - 1 %   Basophils Absolute 0.1  0.0 - 0.1 K/uL  BASIC METABOLIC PANEL      Component Value Range   Sodium 138  135 - 145 mEq/L   Potassium 3.9  3.5 - 5.1 mEq/L   Chloride 100  96 - 112 mEq/L   CO2 30  19 - 32 mEq/L   Glucose, Bld 137 (*) 70 - 99 mg/dL   BUN 16  6 - 23 mg/dL   Creatinine, Ser 7.82  0.50 - 1.10 mg/dL   Calcium 9.0  8.4 - 95.6 mg/dL   GFR calc non Af Amer 89 (*) >90 mL/min   GFR calc Af  Amer >90  >90 mL/min  PROTIME-INR      Component Value Range   Prothrombin Time 12.3  11.6 - 15.2 seconds   INR 0.92  0.00 - 1.49  APTT      Component Value Range   aPTT 31  24 - 37 seconds     No diagnosis found.    MDM  Right  leg pain and swelling which is worrisome for possible DVT. She is started on Lovenox and will be sent to [facility] pending venous Doppler test. Case is endorsed to Dr. Radford Pax.        Dione Booze, MD 03/21/12 [phone]

## 2012-03-21 NOTE — ED Notes (Signed)
PT to ED c/o pain to R lat calf since this am.  Pt denies injury.  No warmth, redness or bruising noted.  Pt states pain increases with movement.

## 2012-03-25 ENCOUNTER — Encounter (HOSPITAL_COMMUNITY)
Admission: RE | Admit: 2012-03-25 | Discharge: 2012-03-25 | Disposition: A | Discharge status: Home / Self Care | Payer: Self-pay | Source: Ambulatory Visit | Attending: Pulmonary Disease | Admitting: Pulmonary Disease

## 2012-03-25 DIAGNOSIS — E662 Morbid (severe) obesity with alveolar hypoventilation: Secondary | ICD-10-CM | POA: Insufficient documentation

## 2012-03-25 DIAGNOSIS — E119 Type 2 diabetes mellitus without complications: Secondary | ICD-10-CM | POA: Insufficient documentation

## 2012-03-25 DIAGNOSIS — Z5189 Encounter for other specified aftercare: Secondary | ICD-10-CM | POA: Insufficient documentation

## 2012-03-25 DIAGNOSIS — G4733 Obstructive sleep apnea (adult) (pediatric): Secondary | ICD-10-CM | POA: Insufficient documentation

## 2012-03-25 DIAGNOSIS — R0902 Hypoxemia: Secondary | ICD-10-CM | POA: Insufficient documentation

## 2012-03-25 DIAGNOSIS — E039 Hypothyroidism, unspecified: Secondary | ICD-10-CM | POA: Insufficient documentation

## 2012-03-25 NOTE — Progress Notes (Signed)
Nina Howell came to Pulmonary Rehab department today to inform us that she had an ER visit due to left knee pain on 03/21/12.  She states she was given oxycodone for pain and she became very weak, nauseated and felt really bad.  Notation made in allergic reaction section of chart.  She saw orthopedic MD and had injection of cortisone into knee joint.  She is advised to pay close attention to CBGs for the next several days, rest her knee and take this week off from rehab.  Follow up with MD is knee does not improve.

## 2012-03-27 ENCOUNTER — Encounter (HOSPITAL_COMMUNITY): Payer: Self-pay

## 2012-04-01 ENCOUNTER — Encounter (HOSPITAL_COMMUNITY)
Admission: RE | Admit: 2012-04-01 | Discharge: 2012-04-01 | Disposition: A | Discharge status: Home / Self Care | Payer: Self-pay | Source: Ambulatory Visit | Attending: Pulmonary Disease | Admitting: Pulmonary Disease

## 2012-04-03 ENCOUNTER — Encounter (HOSPITAL_COMMUNITY)
Admission: RE | Admit: 2012-04-03 | Discharge: 2012-04-03 | Disposition: A | Discharge status: Home / Self Care | Payer: Self-pay | Source: Ambulatory Visit | Attending: Pulmonary Disease | Admitting: Pulmonary Disease

## 2012-04-08 ENCOUNTER — Encounter (HOSPITAL_COMMUNITY)
Admission: RE | Admit: 2012-04-08 | Discharge: 2012-04-08 | Disposition: A | Discharge status: Home / Self Care | Payer: Self-pay | Source: Ambulatory Visit | Attending: Pulmonary Disease | Admitting: Pulmonary Disease

## 2012-04-10 ENCOUNTER — Encounter (HOSPITAL_COMMUNITY)
Admission: RE | Admit: 2012-04-10 | Discharge: 2012-04-10 | Disposition: A | Discharge status: Home / Self Care | Payer: Self-pay | Source: Ambulatory Visit | Attending: Pulmonary Disease | Admitting: Pulmonary Disease

## 2012-04-14 ENCOUNTER — Ambulatory Visit
Admission: RE | Admit: 2012-04-14 | Discharge: 2012-04-14 | Disposition: A | Discharge status: Home / Self Care | Payer: BC Managed Care – PPO | Source: Ambulatory Visit | Attending: General Surgery | Admitting: General Surgery

## 2012-04-14 DIAGNOSIS — R160 Hepatomegaly, not elsewhere classified: Secondary | ICD-10-CM

## 2012-04-15 ENCOUNTER — Encounter (HOSPITAL_COMMUNITY)
Admission: RE | Admit: 2012-04-15 | Discharge: 2012-04-15 | Disposition: A | Discharge status: Home / Self Care | Payer: Self-pay | Source: Ambulatory Visit | Attending: Pulmonary Disease | Admitting: Pulmonary Disease

## 2012-04-17 ENCOUNTER — Encounter (HOSPITAL_COMMUNITY): Payer: Self-pay

## 2012-04-22 ENCOUNTER — Encounter (HOSPITAL_COMMUNITY): Payer: Self-pay

## 2012-04-22 DIAGNOSIS — R0902 Hypoxemia: Secondary | ICD-10-CM | POA: Insufficient documentation

## 2012-04-22 DIAGNOSIS — Z5189 Encounter for other specified aftercare: Secondary | ICD-10-CM | POA: Insufficient documentation

## 2012-04-22 DIAGNOSIS — G4733 Obstructive sleep apnea (adult) (pediatric): Secondary | ICD-10-CM | POA: Insufficient documentation

## 2012-04-22 DIAGNOSIS — E119 Type 2 diabetes mellitus without complications: Secondary | ICD-10-CM | POA: Insufficient documentation

## 2012-04-22 DIAGNOSIS — E662 Morbid (severe) obesity with alveolar hypoventilation: Secondary | ICD-10-CM | POA: Insufficient documentation

## 2012-04-22 DIAGNOSIS — E039 Hypothyroidism, unspecified: Secondary | ICD-10-CM | POA: Insufficient documentation

## 2012-04-24 ENCOUNTER — Encounter (HOSPITAL_COMMUNITY): Payer: Self-pay

## 2012-04-25 ENCOUNTER — Telehealth (INDEPENDENT_AMBULATORY_CARE_PROVIDER_SITE_OTHER): Payer: Self-pay

## 2012-04-25 NOTE — Telephone Encounter (Signed)
Called pt to discuss her [place] results.  She was excited that the mass is smaller in size.  She will not need a f/u US for three years.  We talked about her depression, and I encouraged her to discuss this with her PCP.  We will contact her in three years for another [place].

## 2012-04-29 ENCOUNTER — Encounter (HOSPITAL_COMMUNITY): Payer: Self-pay

## 2012-05-01 ENCOUNTER — Encounter (HOSPITAL_COMMUNITY): Payer: Self-pay

## 2012-05-06 ENCOUNTER — Encounter (HOSPITAL_COMMUNITY)
Admission: RE | Admit: 2012-05-06 | Discharge: 2012-05-06 | Disposition: A | Discharge status: Home / Self Care | Payer: Self-pay | Source: Ambulatory Visit | Attending: Pulmonary Disease | Admitting: Pulmonary Disease

## 2012-05-08 ENCOUNTER — Encounter (HOSPITAL_COMMUNITY): Payer: Self-pay

## 2012-05-13 ENCOUNTER — Encounter (HOSPITAL_COMMUNITY): Payer: Self-pay

## 2012-05-15 ENCOUNTER — Encounter (HOSPITAL_COMMUNITY): Payer: Self-pay

## 2012-05-20 ENCOUNTER — Encounter (HOSPITAL_COMMUNITY): Admission: RE | Admit: 2012-05-20 | Payer: Self-pay | Source: Ambulatory Visit

## 2012-05-22 ENCOUNTER — Encounter (HOSPITAL_COMMUNITY): Payer: Self-pay

## 2012-05-22 DIAGNOSIS — E039 Hypothyroidism, unspecified: Secondary | ICD-10-CM | POA: Insufficient documentation

## 2012-05-22 DIAGNOSIS — R0902 Hypoxemia: Secondary | ICD-10-CM | POA: Insufficient documentation

## 2012-05-22 DIAGNOSIS — E662 Morbid (severe) obesity with alveolar hypoventilation: Secondary | ICD-10-CM | POA: Insufficient documentation

## 2012-05-22 DIAGNOSIS — G4733 Obstructive sleep apnea (adult) (pediatric): Secondary | ICD-10-CM | POA: Insufficient documentation

## 2012-05-22 DIAGNOSIS — E119 Type 2 diabetes mellitus without complications: Secondary | ICD-10-CM | POA: Insufficient documentation

## 2012-05-22 DIAGNOSIS — Z5189 Encounter for other specified aftercare: Secondary | ICD-10-CM | POA: Insufficient documentation

## 2012-05-27 ENCOUNTER — Encounter (HOSPITAL_COMMUNITY): Payer: Self-pay

## 2012-05-29 ENCOUNTER — Encounter (HOSPITAL_COMMUNITY)
Admission: RE | Admit: 2012-05-29 | Discharge: 2012-05-29 | Disposition: A | Discharge status: Home / Self Care | Payer: Self-pay | Source: Ambulatory Visit | Attending: Pulmonary Disease | Admitting: Pulmonary Disease

## 2012-06-03 ENCOUNTER — Encounter (HOSPITAL_COMMUNITY)
Admission: RE | Admit: 2012-06-03 | Discharge: 2012-06-03 | Disposition: A | Discharge status: Home / Self Care | Payer: Self-pay | Source: Ambulatory Visit | Attending: Pulmonary Disease | Admitting: Pulmonary Disease

## 2012-06-04 DIAGNOSIS — Z79899 Other long term (current) drug therapy: Secondary | ICD-10-CM | POA: Diagnosis not present

## 2012-06-04 DIAGNOSIS — E039 Hypothyroidism, unspecified: Secondary | ICD-10-CM | POA: Diagnosis not present

## 2012-06-04 DIAGNOSIS — E119 Type 2 diabetes mellitus without complications: Secondary | ICD-10-CM | POA: Diagnosis not present

## 2012-06-04 DIAGNOSIS — I10 Essential (primary) hypertension: Secondary | ICD-10-CM | POA: Diagnosis not present

## 2012-06-05 ENCOUNTER — Encounter (HOSPITAL_COMMUNITY)
Admission: RE | Admit: 2012-06-05 | Discharge: 2012-06-05 | Disposition: A | Discharge status: Home / Self Care | Payer: Self-pay | Source: Ambulatory Visit | Attending: Pulmonary Disease | Admitting: Pulmonary Disease

## 2012-06-10 ENCOUNTER — Encounter (HOSPITAL_COMMUNITY)
Admission: RE | Admit: 2012-06-10 | Discharge: 2012-06-10 | Disposition: A | Discharge status: Home / Self Care | Payer: Self-pay | Source: Ambulatory Visit | Attending: Pulmonary Disease | Admitting: Pulmonary Disease

## 2012-06-12 ENCOUNTER — Encounter (HOSPITAL_COMMUNITY)
Admission: RE | Admit: 2012-06-12 | Discharge: 2012-06-12 | Disposition: A | Discharge status: Home / Self Care | Payer: Self-pay | Source: Ambulatory Visit | Attending: Pulmonary Disease | Admitting: Pulmonary Disease

## 2012-06-17 ENCOUNTER — Encounter (HOSPITAL_COMMUNITY)
Admission: RE | Admit: 2012-06-17 | Discharge: 2012-06-17 | Disposition: A | Discharge status: Home / Self Care | Payer: Self-pay | Source: Ambulatory Visit | Attending: Pulmonary Disease | Admitting: Pulmonary Disease

## 2012-06-19 ENCOUNTER — Encounter (HOSPITAL_COMMUNITY)
Admission: RE | Admit: 2012-06-19 | Discharge: 2012-06-19 | Disposition: A | Discharge status: Home / Self Care | Payer: Self-pay | Source: Ambulatory Visit | Attending: Pulmonary Disease | Admitting: Pulmonary Disease

## 2012-06-24 ENCOUNTER — Encounter (HOSPITAL_COMMUNITY)
Admission: RE | Admit: 2012-06-24 | Discharge: 2012-06-24 | Disposition: A | Discharge status: Home / Self Care | Payer: Self-pay | Source: Ambulatory Visit | Attending: Pulmonary Disease | Admitting: Pulmonary Disease

## 2012-06-24 DIAGNOSIS — Z5189 Encounter for other specified aftercare: Secondary | ICD-10-CM | POA: Insufficient documentation

## 2012-06-24 DIAGNOSIS — G4733 Obstructive sleep apnea (adult) (pediatric): Secondary | ICD-10-CM | POA: Insufficient documentation

## 2012-06-24 DIAGNOSIS — E039 Hypothyroidism, unspecified: Secondary | ICD-10-CM | POA: Insufficient documentation

## 2012-06-24 DIAGNOSIS — E119 Type 2 diabetes mellitus without complications: Secondary | ICD-10-CM | POA: Insufficient documentation

## 2012-06-24 DIAGNOSIS — E662 Morbid (severe) obesity with alveolar hypoventilation: Secondary | ICD-10-CM | POA: Insufficient documentation

## 2012-06-24 DIAGNOSIS — R0902 Hypoxemia: Secondary | ICD-10-CM | POA: Insufficient documentation

## 2012-06-25 DIAGNOSIS — Z79899 Other long term (current) drug therapy: Secondary | ICD-10-CM | POA: Diagnosis not present

## 2012-06-26 ENCOUNTER — Telehealth (INDEPENDENT_AMBULATORY_CARE_PROVIDER_SITE_OTHER): Payer: Self-pay

## 2012-06-26 ENCOUNTER — Encounter (HOSPITAL_COMMUNITY)
Admission: RE | Admit: 2012-06-26 | Discharge: 2012-06-26 | Disposition: A | Discharge status: Home / Self Care | Payer: Self-pay | Source: Ambulatory Visit | Attending: Pulmonary Disease | Admitting: Pulmonary Disease

## 2012-06-26 NOTE — Telephone Encounter (Signed)
The pt called back.  I gave her the message from Bernie/ Dr Donell Beers that they reviewed her last note and ultrasound.  There was no change on her ultrasound so she doesn't need to come back for another for 3 years.  [employer] will put her on the recall.

## 2012-07-01 ENCOUNTER — Encounter (HOSPITAL_COMMUNITY)
Admission: RE | Admit: 2012-07-01 | Discharge: 2012-07-01 | Disposition: A | Discharge status: Home / Self Care | Payer: Self-pay | Source: Ambulatory Visit | Attending: Pulmonary Disease | Admitting: Pulmonary Disease

## 2012-07-02 DIAGNOSIS — E119 Type 2 diabetes mellitus without complications: Secondary | ICD-10-CM | POA: Diagnosis not present

## 2012-07-02 DIAGNOSIS — H2510 Age-related nuclear cataract, unspecified eye: Secondary | ICD-10-CM | POA: Diagnosis not present

## 2012-07-03 ENCOUNTER — Encounter (HOSPITAL_COMMUNITY): Payer: Self-pay

## 2012-07-08 ENCOUNTER — Ambulatory Visit (INDEPENDENT_AMBULATORY_CARE_PROVIDER_SITE_OTHER): Payer: BC Managed Care – PPO | Admitting: General Surgery

## 2012-07-08 ENCOUNTER — Encounter (HOSPITAL_COMMUNITY)
Admission: RE | Admit: 2012-07-08 | Discharge: 2012-07-08 | Disposition: A | Discharge status: Home / Self Care | Payer: Self-pay | Source: Ambulatory Visit | Attending: Pulmonary Disease | Admitting: Pulmonary Disease

## 2012-07-10 ENCOUNTER — Encounter (HOSPITAL_COMMUNITY)
Admission: RE | Admit: 2012-07-10 | Discharge: 2012-07-10 | Disposition: A | Discharge status: Home / Self Care | Payer: Self-pay | Source: Ambulatory Visit | Attending: Pulmonary Disease | Admitting: Pulmonary Disease

## 2012-07-15 ENCOUNTER — Encounter (HOSPITAL_COMMUNITY)
Admission: RE | Admit: 2012-07-15 | Discharge: 2012-07-15 | Disposition: A | Discharge status: Home / Self Care | Payer: Self-pay | Source: Ambulatory Visit | Attending: Pulmonary Disease | Admitting: Pulmonary Disease

## 2012-07-17 ENCOUNTER — Encounter (HOSPITAL_COMMUNITY)
Admission: RE | Admit: 2012-07-17 | Discharge: 2012-07-17 | Disposition: A | Discharge status: Home / Self Care | Payer: Self-pay | Source: Ambulatory Visit | Attending: Pulmonary Disease | Admitting: Pulmonary Disease

## 2012-07-22 ENCOUNTER — Encounter (HOSPITAL_COMMUNITY): Payer: Self-pay

## 2012-07-22 DIAGNOSIS — Z5189 Encounter for other specified aftercare: Secondary | ICD-10-CM | POA: Insufficient documentation

## 2012-07-22 DIAGNOSIS — R0902 Hypoxemia: Secondary | ICD-10-CM | POA: Insufficient documentation

## 2012-07-22 DIAGNOSIS — E119 Type 2 diabetes mellitus without complications: Secondary | ICD-10-CM | POA: Insufficient documentation

## 2012-07-22 DIAGNOSIS — E039 Hypothyroidism, unspecified: Secondary | ICD-10-CM | POA: Insufficient documentation

## 2012-07-22 DIAGNOSIS — G4733 Obstructive sleep apnea (adult) (pediatric): Secondary | ICD-10-CM | POA: Insufficient documentation

## 2012-07-22 DIAGNOSIS — E662 Morbid (severe) obesity with alveolar hypoventilation: Secondary | ICD-10-CM | POA: Insufficient documentation

## 2012-07-24 ENCOUNTER — Encounter (HOSPITAL_COMMUNITY)
Admission: RE | Admit: 2012-07-24 | Discharge: 2012-07-24 | Disposition: A | Discharge status: Home / Self Care | Payer: Self-pay | Source: Ambulatory Visit | Attending: Pulmonary Disease | Admitting: Pulmonary Disease

## 2012-07-29 ENCOUNTER — Encounter (HOSPITAL_COMMUNITY)
Admission: RE | Admit: 2012-07-29 | Discharge: 2012-07-29 | Disposition: A | Discharge status: Home / Self Care | Payer: Self-pay | Source: Ambulatory Visit | Attending: Pulmonary Disease | Admitting: Pulmonary Disease

## 2012-07-31 ENCOUNTER — Encounter (HOSPITAL_COMMUNITY)
Admission: RE | Admit: 2012-07-31 | Discharge: 2012-07-31 | Disposition: A | Discharge status: Home / Self Care | Payer: Self-pay | Source: Ambulatory Visit | Attending: Pulmonary Disease | Admitting: Pulmonary Disease

## 2012-08-05 ENCOUNTER — Encounter (HOSPITAL_COMMUNITY): Payer: Self-pay

## 2012-08-07 ENCOUNTER — Encounter (HOSPITAL_COMMUNITY)
Admission: RE | Admit: 2012-08-07 | Discharge: 2012-08-07 | Disposition: A | Discharge status: Home / Self Care | Payer: Self-pay | Source: Ambulatory Visit | Attending: Pulmonary Disease | Admitting: Pulmonary Disease

## 2012-08-11 DIAGNOSIS — I10 Essential (primary) hypertension: Secondary | ICD-10-CM | POA: Diagnosis not present

## 2012-08-11 DIAGNOSIS — Z23 Encounter for immunization: Secondary | ICD-10-CM | POA: Diagnosis not present

## 2012-08-11 DIAGNOSIS — E1149 Type 2 diabetes mellitus with other diabetic neurological complication: Secondary | ICD-10-CM | POA: Diagnosis not present

## 2012-08-11 DIAGNOSIS — E669 Obesity, unspecified: Secondary | ICD-10-CM | POA: Diagnosis not present

## 2012-08-11 DIAGNOSIS — E785 Hyperlipidemia, unspecified: Secondary | ICD-10-CM | POA: Diagnosis not present

## 2012-08-11 DIAGNOSIS — E1142 Type 2 diabetes mellitus with diabetic polyneuropathy: Secondary | ICD-10-CM | POA: Diagnosis not present

## 2012-08-11 DIAGNOSIS — E039 Hypothyroidism, unspecified: Secondary | ICD-10-CM | POA: Diagnosis not present

## 2012-08-12 ENCOUNTER — Encounter (HOSPITAL_COMMUNITY): Payer: Self-pay

## 2012-08-14 ENCOUNTER — Encounter (HOSPITAL_COMMUNITY)
Admission: RE | Admit: 2012-08-14 | Discharge: 2012-08-14 | Disposition: A | Discharge status: Home / Self Care | Payer: Self-pay | Source: Ambulatory Visit | Attending: Pulmonary Disease | Admitting: Pulmonary Disease

## 2012-08-19 ENCOUNTER — Encounter (HOSPITAL_COMMUNITY)
Admission: RE | Admit: 2012-08-19 | Discharge: 2012-08-19 | Disposition: A | Discharge status: Home / Self Care | Payer: Self-pay | Source: Ambulatory Visit | Attending: Pulmonary Disease | Admitting: Pulmonary Disease

## 2012-08-19 DIAGNOSIS — R0902 Hypoxemia: Secondary | ICD-10-CM | POA: Insufficient documentation

## 2012-08-19 DIAGNOSIS — G4733 Obstructive sleep apnea (adult) (pediatric): Secondary | ICD-10-CM | POA: Insufficient documentation

## 2012-08-19 DIAGNOSIS — Z5189 Encounter for other specified aftercare: Secondary | ICD-10-CM | POA: Insufficient documentation

## 2012-08-19 DIAGNOSIS — E662 Morbid (severe) obesity with alveolar hypoventilation: Secondary | ICD-10-CM | POA: Insufficient documentation

## 2012-08-19 DIAGNOSIS — E119 Type 2 diabetes mellitus without complications: Secondary | ICD-10-CM | POA: Insufficient documentation

## 2012-08-19 DIAGNOSIS — E039 Hypothyroidism, unspecified: Secondary | ICD-10-CM | POA: Insufficient documentation

## 2012-08-21 ENCOUNTER — Encounter (HOSPITAL_COMMUNITY): Payer: Self-pay

## 2012-08-26 ENCOUNTER — Encounter (HOSPITAL_COMMUNITY)
Admission: RE | Admit: 2012-08-26 | Discharge: 2012-08-26 | Disposition: A | Discharge status: Home / Self Care | Payer: Self-pay | Source: Ambulatory Visit | Attending: Pulmonary Disease | Admitting: Pulmonary Disease

## 2012-08-28 ENCOUNTER — Encounter (HOSPITAL_COMMUNITY)
Admission: RE | Admit: 2012-08-28 | Discharge: 2012-08-28 | Disposition: A | Discharge status: Home / Self Care | Payer: Self-pay | Source: Ambulatory Visit | Attending: Pulmonary Disease | Admitting: Pulmonary Disease

## 2012-08-29 DIAGNOSIS — M2010 Hallux valgus (acquired), unspecified foot: Secondary | ICD-10-CM | POA: Diagnosis not present

## 2012-09-02 ENCOUNTER — Encounter (HOSPITAL_COMMUNITY)
Admission: RE | Admit: 2012-09-02 | Discharge: 2012-09-02 | Disposition: A | Discharge status: Home / Self Care | Payer: Self-pay | Source: Ambulatory Visit | Attending: Pulmonary Disease | Admitting: Pulmonary Disease

## 2012-09-03 DIAGNOSIS — E039 Hypothyroidism, unspecified: Secondary | ICD-10-CM | POA: Diagnosis not present

## 2012-09-03 DIAGNOSIS — E119 Type 2 diabetes mellitus without complications: Secondary | ICD-10-CM | POA: Diagnosis not present

## 2012-09-03 DIAGNOSIS — E669 Obesity, unspecified: Secondary | ICD-10-CM | POA: Diagnosis not present

## 2012-09-03 DIAGNOSIS — E785 Hyperlipidemia, unspecified: Secondary | ICD-10-CM | POA: Diagnosis not present

## 2012-09-03 DIAGNOSIS — I10 Essential (primary) hypertension: Secondary | ICD-10-CM | POA: Diagnosis not present

## 2012-09-04 ENCOUNTER — Encounter (HOSPITAL_COMMUNITY)
Admission: RE | Admit: 2012-09-04 | Discharge: 2012-09-04 | Disposition: A | Discharge status: Home / Self Care | Payer: Self-pay | Source: Ambulatory Visit | Attending: Pulmonary Disease | Admitting: Pulmonary Disease

## 2012-09-04 DIAGNOSIS — M25579 Pain in unspecified ankle and joints of unspecified foot: Secondary | ICD-10-CM | POA: Diagnosis not present

## 2012-09-04 DIAGNOSIS — M25519 Pain in unspecified shoulder: Secondary | ICD-10-CM | POA: Diagnosis not present

## 2012-09-09 ENCOUNTER — Encounter (HOSPITAL_COMMUNITY): Payer: Self-pay

## 2012-09-11 ENCOUNTER — Encounter (HOSPITAL_COMMUNITY): Payer: Self-pay

## 2012-09-16 ENCOUNTER — Encounter (HOSPITAL_COMMUNITY)
Admission: RE | Admit: 2012-09-16 | Discharge: 2012-09-16 | Disposition: A | Discharge status: Home / Self Care | Payer: Self-pay | Source: Ambulatory Visit | Attending: Pulmonary Disease | Admitting: Pulmonary Disease

## 2012-09-18 ENCOUNTER — Encounter (HOSPITAL_COMMUNITY)
Admission: RE | Admit: 2012-09-18 | Discharge: 2012-09-18 | Disposition: A | Discharge status: Home / Self Care | Payer: Self-pay | Source: Ambulatory Visit | Attending: Pulmonary Disease | Admitting: Pulmonary Disease

## 2012-09-18 DIAGNOSIS — E039 Hypothyroidism, unspecified: Secondary | ICD-10-CM | POA: Insufficient documentation

## 2012-09-18 DIAGNOSIS — R0902 Hypoxemia: Secondary | ICD-10-CM | POA: Insufficient documentation

## 2012-09-18 DIAGNOSIS — E662 Morbid (severe) obesity with alveolar hypoventilation: Secondary | ICD-10-CM | POA: Insufficient documentation

## 2012-09-18 DIAGNOSIS — G4733 Obstructive sleep apnea (adult) (pediatric): Secondary | ICD-10-CM | POA: Insufficient documentation

## 2012-09-18 DIAGNOSIS — Z5189 Encounter for other specified aftercare: Secondary | ICD-10-CM | POA: Insufficient documentation

## 2012-09-18 DIAGNOSIS — E119 Type 2 diabetes mellitus without complications: Secondary | ICD-10-CM | POA: Insufficient documentation

## 2012-09-23 ENCOUNTER — Encounter (HOSPITAL_COMMUNITY): Payer: Self-pay

## 2012-09-25 ENCOUNTER — Encounter (HOSPITAL_COMMUNITY): Payer: Self-pay

## 2012-09-30 ENCOUNTER — Encounter (HOSPITAL_COMMUNITY)
Admission: RE | Admit: 2012-09-30 | Discharge: 2012-09-30 | Disposition: A | Discharge status: Home / Self Care | Payer: Self-pay | Source: Ambulatory Visit | Attending: Pulmonary Disease | Admitting: Pulmonary Disease

## 2012-10-02 ENCOUNTER — Encounter (HOSPITAL_COMMUNITY): Payer: Self-pay

## 2012-10-07 ENCOUNTER — Encounter (HOSPITAL_COMMUNITY)
Admission: RE | Admit: 2012-10-07 | Discharge: 2012-10-07 | Disposition: A | Discharge status: Home / Self Care | Payer: Self-pay | Source: Ambulatory Visit | Attending: Pulmonary Disease | Admitting: Pulmonary Disease

## 2012-10-09 ENCOUNTER — Encounter (HOSPITAL_COMMUNITY)
Admission: RE | Admit: 2012-10-09 | Discharge: 2012-10-09 | Disposition: A | Discharge status: Home / Self Care | Payer: Self-pay | Source: Ambulatory Visit | Attending: Pulmonary Disease | Admitting: Pulmonary Disease

## 2012-10-14 ENCOUNTER — Encounter (HOSPITAL_COMMUNITY)
Admission: RE | Admit: 2012-10-14 | Discharge: 2012-10-14 | Disposition: A | Discharge status: Home / Self Care | Payer: Self-pay | Source: Ambulatory Visit | Attending: Pulmonary Disease | Admitting: Pulmonary Disease

## 2012-10-16 ENCOUNTER — Encounter (HOSPITAL_COMMUNITY): Payer: Self-pay

## 2012-10-21 ENCOUNTER — Encounter (HOSPITAL_COMMUNITY): Payer: BC Managed Care – PPO

## 2012-10-21 DIAGNOSIS — Z5189 Encounter for other specified aftercare: Secondary | ICD-10-CM | POA: Insufficient documentation

## 2012-10-21 DIAGNOSIS — E662 Morbid (severe) obesity with alveolar hypoventilation: Secondary | ICD-10-CM | POA: Insufficient documentation

## 2012-10-21 DIAGNOSIS — E039 Hypothyroidism, unspecified: Secondary | ICD-10-CM | POA: Insufficient documentation

## 2012-10-21 DIAGNOSIS — G4733 Obstructive sleep apnea (adult) (pediatric): Secondary | ICD-10-CM | POA: Insufficient documentation

## 2012-10-21 DIAGNOSIS — E119 Type 2 diabetes mellitus without complications: Secondary | ICD-10-CM | POA: Insufficient documentation

## 2012-10-21 DIAGNOSIS — R0902 Hypoxemia: Secondary | ICD-10-CM | POA: Insufficient documentation

## 2012-10-23 ENCOUNTER — Encounter (HOSPITAL_COMMUNITY): Payer: BC Managed Care – PPO

## 2012-10-28 ENCOUNTER — Encounter (HOSPITAL_COMMUNITY): Payer: BC Managed Care – PPO

## 2012-10-30 ENCOUNTER — Encounter (HOSPITAL_COMMUNITY): Payer: BC Managed Care – PPO

## 2012-11-04 ENCOUNTER — Encounter (HOSPITAL_COMMUNITY)
Admission: RE | Admit: 2012-11-04 | Discharge: 2012-11-04 | Disposition: A | Discharge status: Home / Self Care | Payer: BC Managed Care – PPO | Source: Ambulatory Visit | Attending: Pulmonary Disease | Admitting: Pulmonary Disease

## 2012-11-04 NOTE — Progress Notes (Signed)
Nina Howell called the later part of last week, left message with department secretary that she will be doing water aerobics and will not return.  Explanation to patient that she would be charged for this month, did not notify us in time to eliminate charge Cathie Olden RN.

## 2012-11-06 ENCOUNTER — Encounter (HOSPITAL_COMMUNITY): Admission: RE | Admit: 2012-11-06 | Payer: BC Managed Care – PPO | Source: Ambulatory Visit

## 2012-11-11 ENCOUNTER — Encounter (HOSPITAL_COMMUNITY): Payer: BC Managed Care – PPO

## 2012-11-13 ENCOUNTER — Encounter (HOSPITAL_COMMUNITY): Payer: BC Managed Care – PPO

## 2012-11-18 ENCOUNTER — Encounter (HOSPITAL_COMMUNITY): Payer: BC Managed Care – PPO

## 2012-11-20 ENCOUNTER — Encounter (HOSPITAL_COMMUNITY): Payer: BC Managed Care – PPO

## 2012-11-25 ENCOUNTER — Encounter (HOSPITAL_COMMUNITY): Payer: BC Managed Care – PPO

## 2012-11-27 ENCOUNTER — Encounter (HOSPITAL_COMMUNITY): Payer: BC Managed Care – PPO

## 2012-12-02 ENCOUNTER — Encounter (HOSPITAL_COMMUNITY): Payer: BC Managed Care – PPO

## 2012-12-04 ENCOUNTER — Encounter (HOSPITAL_COMMUNITY): Payer: BC Managed Care – PPO

## 2012-12-05 ENCOUNTER — Encounter: Payer: Self-pay | Admitting: Pulmonary Disease

## 2012-12-05 ENCOUNTER — Ambulatory Visit (INDEPENDENT_AMBULATORY_CARE_PROVIDER_SITE_OTHER): Payer: BC Managed Care – PPO | Admitting: Pulmonary Disease

## 2012-12-05 VITALS — BP 132/78 | HR 71 | Temp 98.3°F | Ht 61.5 in | Wt 330.4 lb

## 2012-12-05 DIAGNOSIS — G4733 Obstructive sleep apnea (adult) (pediatric): Secondary | ICD-10-CM

## 2012-12-05 NOTE — Assessment & Plan Note (Signed)
Weight loss encouraged, compliance with goal of at least 4-6 hrs every night is the expectation. Advised against medications with sedative side effects Cautioned against driving when sleepy - understanding that sleepiness will vary on a day to day basis  

## 2012-12-05 NOTE — Progress Notes (Signed)
  Subjective:    Patient ID: Nina Howell, female    DOB: [birth year], 72 y.o.   MRN: [id number]  HPI  71/F, morbidly obese for FU of obstructive sleep apnea/ OHS .  Adm 04/10/10 with worsening dyspnea & hypoxia, VQ neg, BNP 103, diuresed, ABG 7.43/63/89/97% on 2 L, discharged on 2 L & Bilevel (empiric) ? 14/8  PSG subsequently showed moderate obstructive sleep apnea with AHi 15/h.  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    1/13 hysterescopy- dnc and polyp resection performed under spinal anesthesia , uneventful    12/05/2012 Annual FU  pt reports she wears her biPAP everynight 5-7 hrs a night. Pt states she does not know how to adjust the humidifer and the air feels hot blowing into the machine Compliant with BiPAP every night, has come off O2 , mask ok , pressure ok, no dryness  She was transitioned off pulmonary rehabilitation to water aerobics Weight is stable at 330 pounds  Review of Systems neg for any significant sore throat, dysphagia, itching, sneezing, nasal congestion or excess/ purulent secretions, fever, chills, sweats, unintended wt loss, pleuritic or exertional cp, hempoptysis, orthopnea pnd or change in chronic leg swelling. Also denies presyncope, palpitations, heartburn, abdominal pain, nausea, vomiting, diarrhea or change in bowel or urinary habits, dysuria,hematuria, rash, arthralgias, visual complaints, headache, numbness weakness or ataxia.     Objective:   Physical Exam  Gen. Pleasant, obese, in no distress ENT - no lesions, no post nasal drip Neck: No JVD, no thyromegaly, no carotid bruits Lungs: no use of accessory muscles, no dullness to percussion, decreased without rales or rhonchi  Cardiovascular: Rhythm regular, heart sounds  normal, no murmurs or  gallops, no peripheral edema Musculoskeletal: No deformities, no cyanosis or clubbing , no tremors       Assessment & Plan:

## 2012-12-05 NOTE — Patient Instructions (Addendum)
Your bipap is set at 14/8 cm Disabled card filed out You can adjust Humidity setting

## 2012-12-09 ENCOUNTER — Encounter (HOSPITAL_COMMUNITY): Payer: BC Managed Care – PPO

## 2012-12-11 ENCOUNTER — Encounter (HOSPITAL_COMMUNITY): Payer: BC Managed Care – PPO

## 2012-12-15 DIAGNOSIS — I10 Essential (primary) hypertension: Secondary | ICD-10-CM | POA: Diagnosis not present

## 2012-12-15 DIAGNOSIS — E1149 Type 2 diabetes mellitus with other diabetic neurological complication: Secondary | ICD-10-CM | POA: Diagnosis not present

## 2012-12-15 DIAGNOSIS — E039 Hypothyroidism, unspecified: Secondary | ICD-10-CM | POA: Diagnosis not present

## 2012-12-15 DIAGNOSIS — E785 Hyperlipidemia, unspecified: Secondary | ICD-10-CM | POA: Diagnosis not present

## 2012-12-15 DIAGNOSIS — E669 Obesity, unspecified: Secondary | ICD-10-CM | POA: Diagnosis not present

## 2012-12-15 DIAGNOSIS — E1142 Type 2 diabetes mellitus with diabetic polyneuropathy: Secondary | ICD-10-CM | POA: Diagnosis not present

## 2012-12-16 ENCOUNTER — Encounter (HOSPITAL_COMMUNITY): Payer: BC Managed Care – PPO

## 2012-12-18 ENCOUNTER — Encounter (HOSPITAL_COMMUNITY): Payer: BC Managed Care – PPO

## 2012-12-22 DIAGNOSIS — E1142 Type 2 diabetes mellitus with diabetic polyneuropathy: Secondary | ICD-10-CM | POA: Diagnosis not present

## 2012-12-22 DIAGNOSIS — E1149 Type 2 diabetes mellitus with other diabetic neurological complication: Secondary | ICD-10-CM | POA: Diagnosis not present

## 2012-12-23 ENCOUNTER — Encounter (HOSPITAL_COMMUNITY): Payer: BC Managed Care – PPO

## 2012-12-25 ENCOUNTER — Encounter (HOSPITAL_COMMUNITY): Payer: BC Managed Care – PPO

## 2012-12-30 ENCOUNTER — Encounter (HOSPITAL_COMMUNITY): Payer: BC Managed Care – PPO

## 2013-01-01 ENCOUNTER — Encounter (HOSPITAL_COMMUNITY): Payer: BC Managed Care – PPO

## 2013-01-06 ENCOUNTER — Encounter (HOSPITAL_COMMUNITY): Payer: BC Managed Care – PPO

## 2013-03-06 DIAGNOSIS — E1142 Type 2 diabetes mellitus with diabetic polyneuropathy: Secondary | ICD-10-CM | POA: Diagnosis not present

## 2013-03-06 DIAGNOSIS — E1149 Type 2 diabetes mellitus with other diabetic neurological complication: Secondary | ICD-10-CM | POA: Diagnosis not present

## 2013-03-06 DIAGNOSIS — I10 Essential (primary) hypertension: Secondary | ICD-10-CM | POA: Diagnosis not present

## 2013-03-06 DIAGNOSIS — Z79899 Other long term (current) drug therapy: Secondary | ICD-10-CM | POA: Diagnosis not present

## 2013-03-16 DIAGNOSIS — E1149 Type 2 diabetes mellitus with other diabetic neurological complication: Secondary | ICD-10-CM | POA: Diagnosis not present

## 2013-03-16 DIAGNOSIS — I10 Essential (primary) hypertension: Secondary | ICD-10-CM | POA: Diagnosis not present

## 2013-03-16 DIAGNOSIS — E669 Obesity, unspecified: Secondary | ICD-10-CM | POA: Diagnosis not present

## 2013-03-16 DIAGNOSIS — E039 Hypothyroidism, unspecified: Secondary | ICD-10-CM | POA: Diagnosis not present

## 2013-03-16 DIAGNOSIS — E785 Hyperlipidemia, unspecified: Secondary | ICD-10-CM | POA: Diagnosis not present

## 2013-03-16 DIAGNOSIS — E1142 Type 2 diabetes mellitus with diabetic polyneuropathy: Secondary | ICD-10-CM | POA: Diagnosis not present

## 2013-03-16 DIAGNOSIS — Z23 Encounter for immunization: Secondary | ICD-10-CM | POA: Diagnosis not present

## 2013-05-28 IMAGING — US US ABDOMEN LIMITED
1 series · 14 of 25 positions shown · non-contrast
Comparison: Ultrasound 03/30/2011 and the CT scan 03/23/2010

CLINICAL DATA: Follow up liver mass

ABDOMEN ULTRASOUND LIMITED
TECHNIQUE: Right upper quadrant ultrasound

[Series 1: us abdomen limited · 0.26mm/px · 14 of 50 slices shown]
[im 1/50]
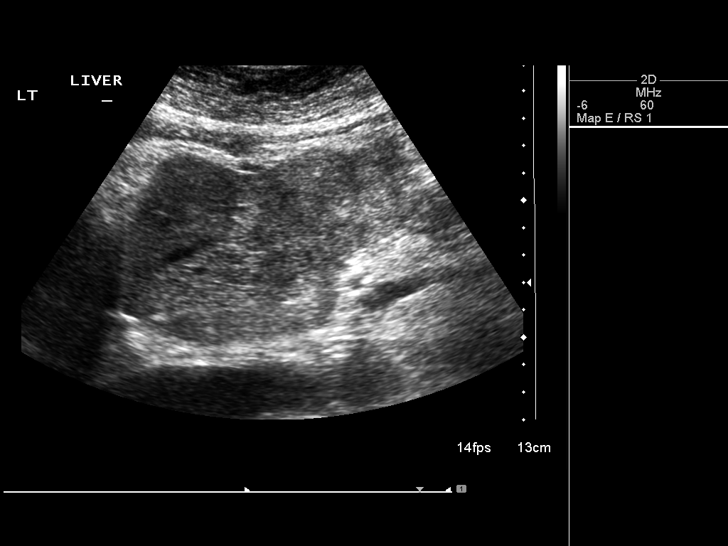
[im 5/50]
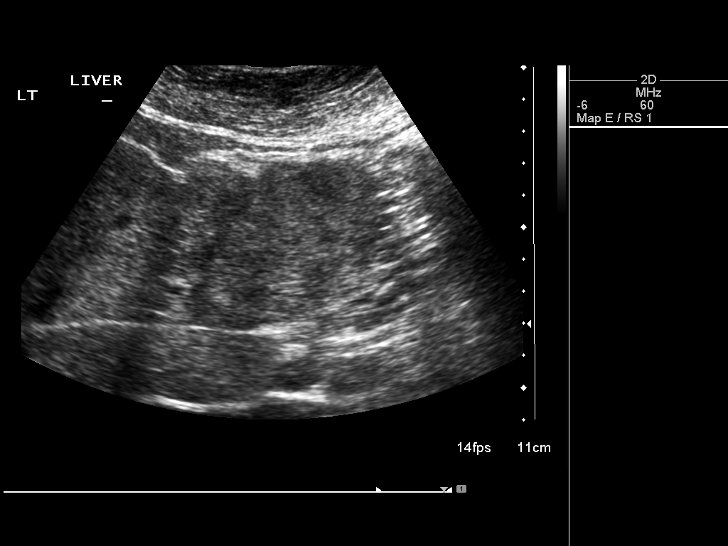
[im 9/50]
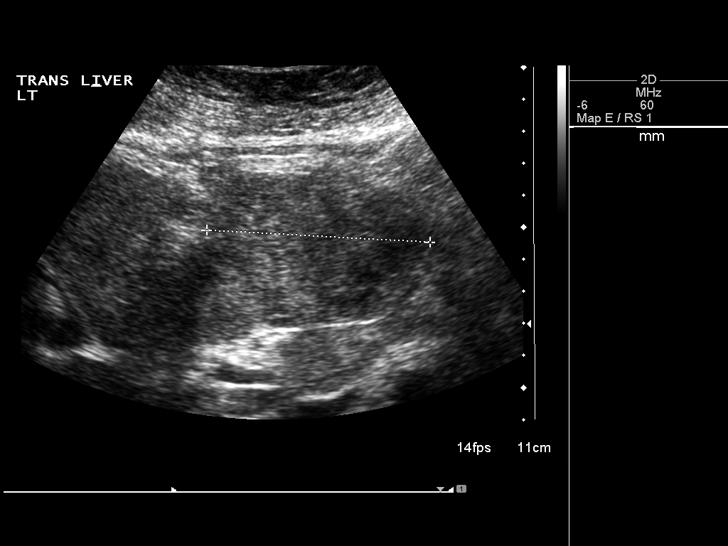
[im 13/50]
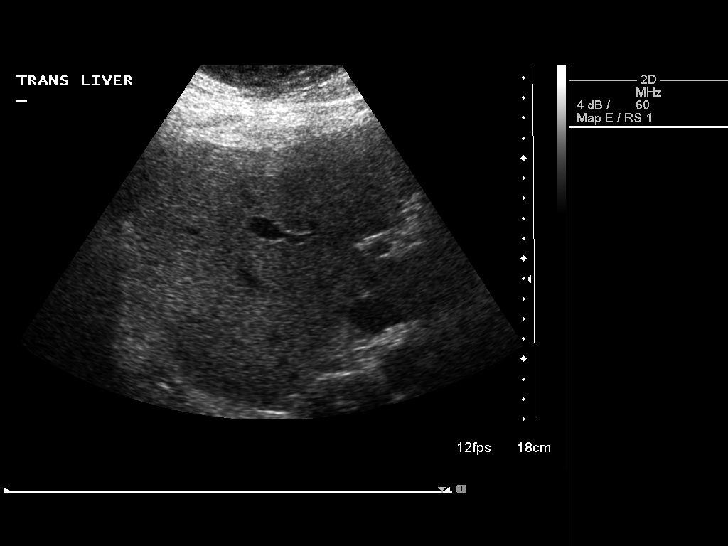
[im 17/50]
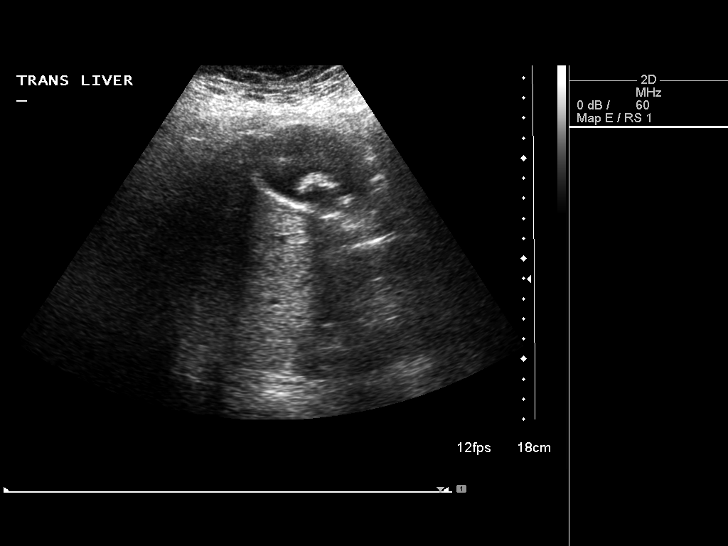
[im 19/50]
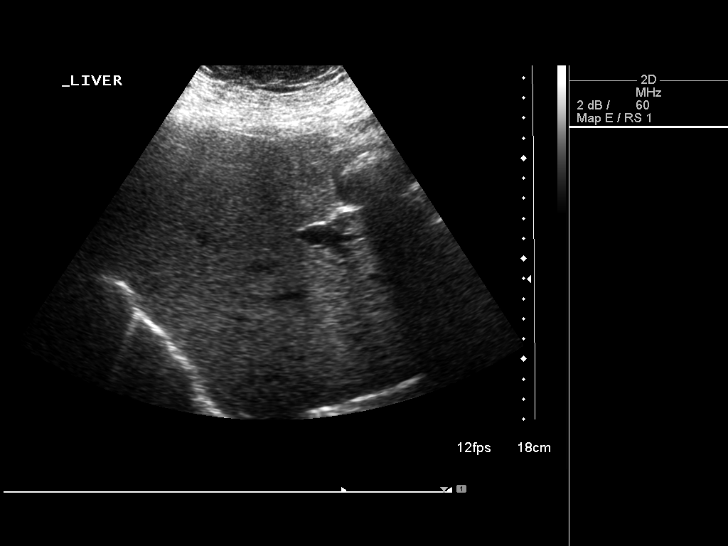
[im 23/50]
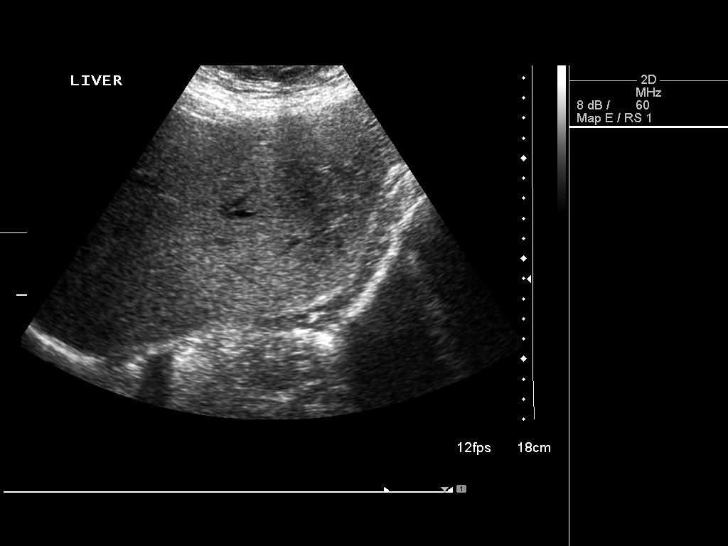
[im 27/50]
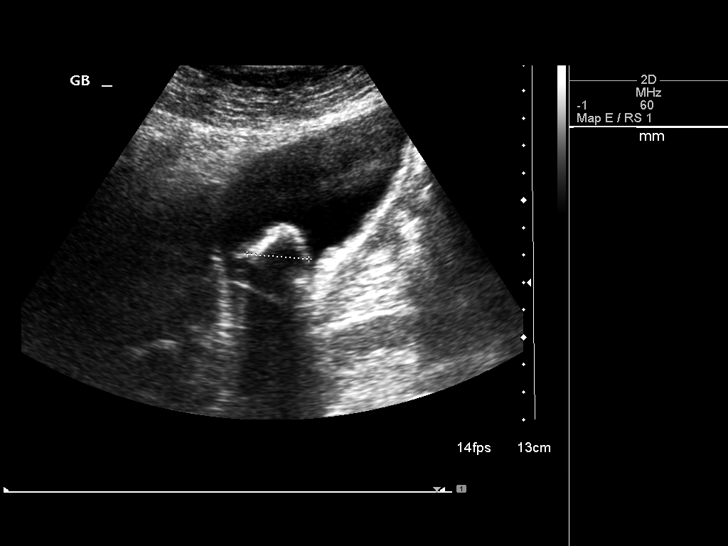
[im 31/50]
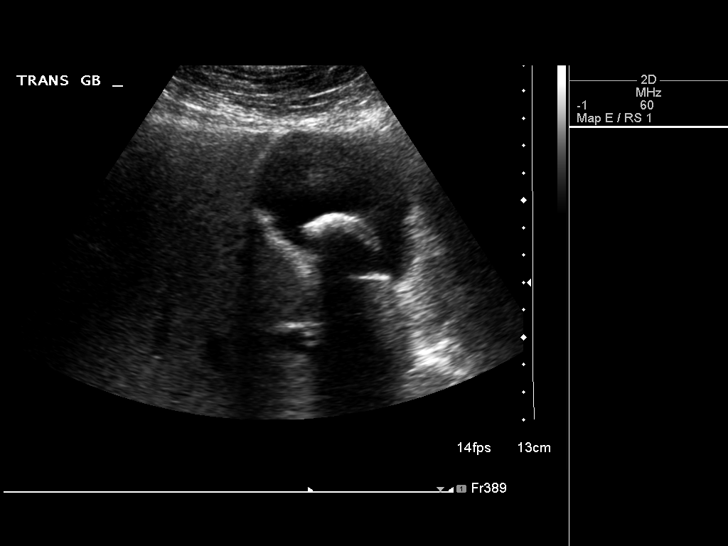
[im 33/50]
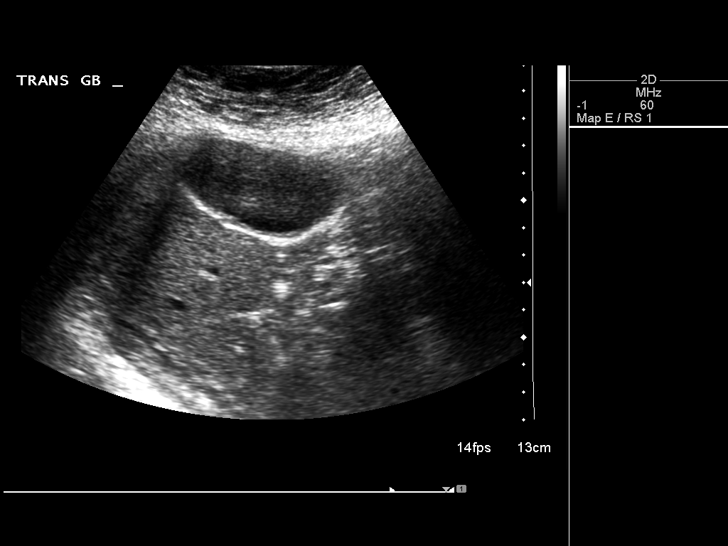
[im 37/50]
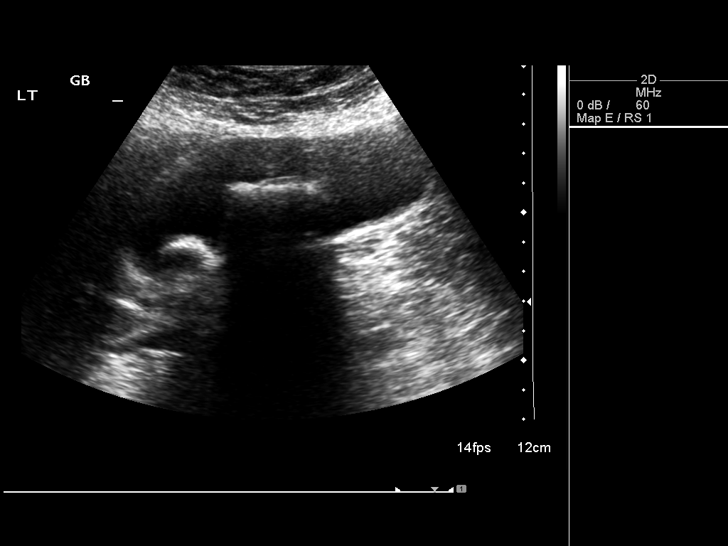
[im 41/50]
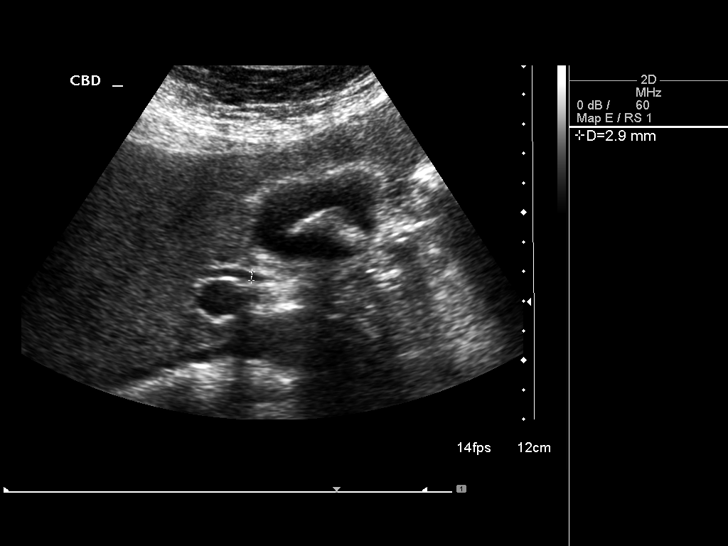
[im 45/50]
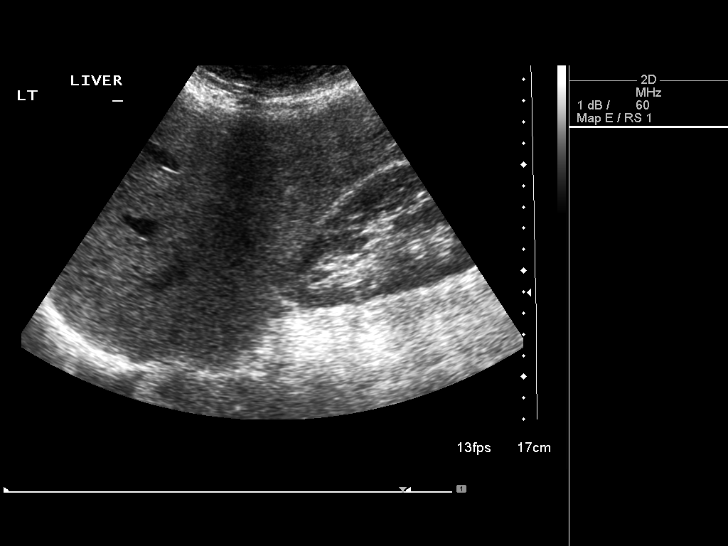
[im 50/50]
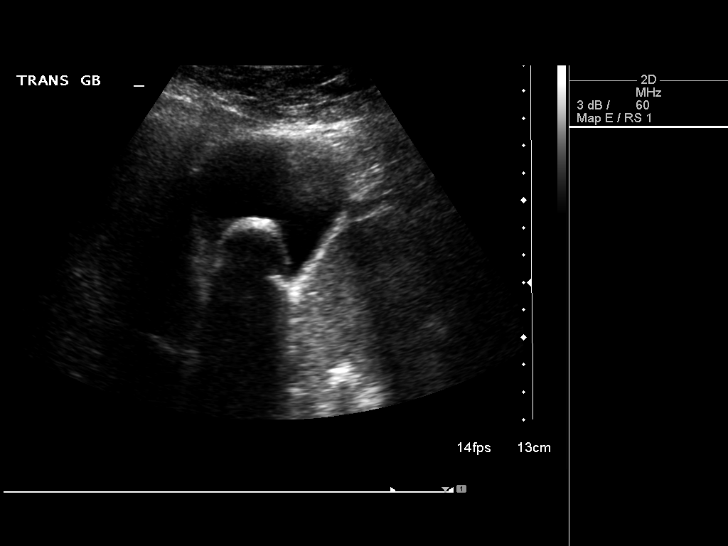

[14 of 25 positions shown; findings below may reference images not displayed]

FINDINGS: At least two mobile gallstones are noted within
gallbladder the largest measures 3.4 cm.  No thickening of
gallbladder wall.  No sonographic Murphy's sign.

CBD is within normal limits measures 2.9 mm in diameter.   Again
noted a solid irregular mass in left hepatic lobe, isoechoic with
the liver measures 6.8 x 4.6 x 7 cm.  On the prior exam measures
6.9 x 6.5 x 6 cm.  No biliary ductal dilatation.
IMPRESSION: 1.  Again noted isoechoic irregular mass left hepatic lobe measures
6.8 x 7 cm.  On the prior exam measures 6.9 by 6.5 cm.  No
intrahepatic biliary ductal dilatation.
2.  Gallstones are noted within gallbladder the largest measures
3.4 cm.
3.  Normal CBD.

## 2013-06-05 DIAGNOSIS — Z79899 Other long term (current) drug therapy: Secondary | ICD-10-CM | POA: Diagnosis not present

## 2013-06-05 DIAGNOSIS — I10 Essential (primary) hypertension: Secondary | ICD-10-CM | POA: Diagnosis not present

## 2013-06-05 DIAGNOSIS — E1149 Type 2 diabetes mellitus with other diabetic neurological complication: Secondary | ICD-10-CM | POA: Diagnosis not present

## 2013-06-05 DIAGNOSIS — E119 Type 2 diabetes mellitus without complications: Secondary | ICD-10-CM | POA: Diagnosis not present

## 2013-06-19 DIAGNOSIS — I10 Essential (primary) hypertension: Secondary | ICD-10-CM | POA: Diagnosis not present

## 2013-06-19 DIAGNOSIS — E785 Hyperlipidemia, unspecified: Secondary | ICD-10-CM | POA: Diagnosis not present

## 2013-06-19 DIAGNOSIS — E1142 Type 2 diabetes mellitus with diabetic polyneuropathy: Secondary | ICD-10-CM | POA: Diagnosis not present

## 2013-06-19 DIAGNOSIS — E669 Obesity, unspecified: Secondary | ICD-10-CM | POA: Diagnosis not present

## 2013-06-19 DIAGNOSIS — E1149 Type 2 diabetes mellitus with other diabetic neurological complication: Secondary | ICD-10-CM | POA: Diagnosis not present

## 2013-06-19 DIAGNOSIS — E039 Hypothyroidism, unspecified: Secondary | ICD-10-CM | POA: Diagnosis not present

## 2013-07-29 DIAGNOSIS — H04129 Dry eye syndrome of unspecified lacrimal gland: Secondary | ICD-10-CM | POA: Diagnosis not present

## 2013-07-29 DIAGNOSIS — H5200 Hypermetropia, unspecified eye: Secondary | ICD-10-CM | POA: Diagnosis not present

## 2013-07-29 DIAGNOSIS — E119 Type 2 diabetes mellitus without complications: Secondary | ICD-10-CM | POA: Diagnosis not present

## 2013-07-29 DIAGNOSIS — H52209 Unspecified astigmatism, unspecified eye: Secondary | ICD-10-CM | POA: Diagnosis not present

## 2013-07-29 DIAGNOSIS — H2589 Other age-related cataract: Secondary | ICD-10-CM | POA: Diagnosis not present

## 2013-10-02 DIAGNOSIS — E1149 Type 2 diabetes mellitus with other diabetic neurological complication: Secondary | ICD-10-CM | POA: Diagnosis not present

## 2013-10-02 DIAGNOSIS — E1142 Type 2 diabetes mellitus with diabetic polyneuropathy: Secondary | ICD-10-CM | POA: Diagnosis not present

## 2013-10-02 DIAGNOSIS — I10 Essential (primary) hypertension: Secondary | ICD-10-CM | POA: Diagnosis not present

## 2013-11-13 DIAGNOSIS — E039 Hypothyroidism, unspecified: Secondary | ICD-10-CM | POA: Diagnosis not present

## 2013-11-13 DIAGNOSIS — E669 Obesity, unspecified: Secondary | ICD-10-CM | POA: Diagnosis not present

## 2013-11-13 DIAGNOSIS — E1142 Type 2 diabetes mellitus with diabetic polyneuropathy: Secondary | ICD-10-CM | POA: Diagnosis not present

## 2013-11-13 DIAGNOSIS — E1149 Type 2 diabetes mellitus with other diabetic neurological complication: Secondary | ICD-10-CM | POA: Diagnosis not present

## 2013-11-13 DIAGNOSIS — Z23 Encounter for immunization: Secondary | ICD-10-CM | POA: Diagnosis not present

## 2013-11-13 DIAGNOSIS — Z6841 Body Mass Index (BMI) 40.0 and over, adult: Secondary | ICD-10-CM | POA: Diagnosis not present

## 2013-11-13 DIAGNOSIS — E785 Hyperlipidemia, unspecified: Secondary | ICD-10-CM | POA: Diagnosis not present

## 2013-11-13 DIAGNOSIS — I10 Essential (primary) hypertension: Secondary | ICD-10-CM | POA: Diagnosis not present

## 2014-01-15 ENCOUNTER — Ambulatory Visit (INDEPENDENT_AMBULATORY_CARE_PROVIDER_SITE_OTHER): Payer: BC Managed Care – PPO | Admitting: Pulmonary Disease

## 2014-01-15 ENCOUNTER — Encounter: Payer: Self-pay | Admitting: Pulmonary Disease

## 2014-01-15 VITALS — BP 110/64 | HR 68 | Ht 61.5 in | Wt 337.0 lb

## 2014-01-15 DIAGNOSIS — G4733 Obstructive sleep apnea (adult) (pediatric): Secondary | ICD-10-CM

## 2014-01-15 NOTE — Assessment & Plan Note (Signed)
ct bipap at 14/8 cm CPAP supplies will be renewed for a year  Weight loss encouraged, compliance with goal of at least 4-6 hrs every night is the expectation. Advised against medications with sedative side effects Cautioned against driving when sleepy - understanding that sleepiness will vary on a day to day basis

## 2014-01-15 NOTE — Patient Instructions (Signed)
Your bipap is set at 14/8 cm CPAP supplies will be renewed for a year

## 2014-01-15 NOTE — Progress Notes (Signed)
   Subjective:    Patient ID: Nina Howell, female    DOB: 03/24/1941, 73 y.o.   MRN: [id number]  HPI  72/F, morbidly obese for FU of obstructive sleep apnea/ OHS .  Adm 03/2010 with worsening dyspnea & hypoxia, VQ neg, BNP 103, diuresed, ABG 7.43/63/89/97% on 2 L, discharged on 2 L & Bilevel 14/8  PSG subsequently showed moderate obstructive sleep apnea with AHi 15/h.  Liver hemangioma was noted on imaging. Echo july'11 >> EF 45-50%septal/ inferoseptal hypokinesis [name])  Download dec-jan'12 >> excellent compliance & control of events on VPAP auto  Lost 100 lbs in 1 yr !  1/13 hysterescopy- dnc and polyp resection performed under spinal anesthesia , uneventful  01/15/2014 Annual FU  pt reports she wears her biPAP everynight >6 hrs a night.  Compliant with BiPAP every night,  off O2 , mask ok , pressure ok, no dryness, no nasal symptoms  She stayed off the gym due to the heat. Weight is up some at 337 pounds    Review of Systems neg for any significant sore throat, dysphagia, itching, sneezing, nasal congestion or excess/ purulent secretions, fever, chills, sweats, unintended wt loss, pleuritic or exertional cp, hempoptysis, orthopnea pnd or change in chronic leg swelling. Also denies presyncope, palpitations, heartburn, abdominal pain, nausea, vomiting, diarrhea or change in bowel or urinary habits, dysuria,hematuria, rash, arthralgias, visual complaints, headache, numbness weakness or ataxia.     Objective:   Physical Exam  Gen. Pleasant, obese, in no distress ENT - no lesions, no post nasal drip Neck: No JVD, no thyromegaly, no carotid bruits Lungs: no use of accessory muscles, no dullness to percussion, decreased without rales or rhonchi  Cardiovascular: Rhythm regular, heart sounds  normal, no murmurs or gallops, no peripheral edema Musculoskeletal: No deformities, no cyanosis or clubbing , no tremors       Assessment & Plan:

## 2014-04-19 ENCOUNTER — Telehealth: Payer: Self-pay | Admitting: Pulmonary Disease

## 2014-04-19 NOTE — Telephone Encounter (Signed)
Form faxed to [employer].  In scan pile.  Nothing further needed.

## 2014-06-15 ENCOUNTER — Telehealth: Payer: Self-pay | Admitting: Pulmonary Disease

## 2014-06-15 DIAGNOSIS — G4733 Obstructive sleep apnea (adult) (pediatric): Secondary | ICD-10-CM

## 2014-06-15 NOTE — Telephone Encounter (Signed)
Spoke with pt. Advised her that I would go ahead and get an order to [facility] for a new mask. Order has been placed.

## 2014-06-22 ENCOUNTER — Telehealth: Payer: Self-pay | Admitting: Pulmonary Disease

## 2014-06-22 NOTE — Telephone Encounter (Addendum)
Solmon Ice has a CMN for pt's supplies. She is giving this to [place] to have him sign. This will be faxed back once signed.  Pt has been made aware of this. Nothing further was needed.

## 2014-06-22 NOTE — Telephone Encounter (Signed)
lmtcb x1 Need to know what specific supplies she needs

## 2014-07-09 DIAGNOSIS — E039 Hypothyroidism, unspecified: Secondary | ICD-10-CM | POA: Diagnosis not present

## 2014-07-09 DIAGNOSIS — E1142 Type 2 diabetes mellitus with diabetic polyneuropathy: Secondary | ICD-10-CM | POA: Diagnosis not present

## 2014-07-09 DIAGNOSIS — Z794 Long term (current) use of insulin: Secondary | ICD-10-CM | POA: Diagnosis not present

## 2014-07-09 DIAGNOSIS — Z6841 Body Mass Index (BMI) 40.0 and over, adult: Secondary | ICD-10-CM | POA: Diagnosis not present

## 2014-08-09 DIAGNOSIS — E559 Vitamin D deficiency, unspecified: Secondary | ICD-10-CM | POA: Diagnosis not present

## 2014-08-09 DIAGNOSIS — I5022 Chronic systolic (congestive) heart failure: Secondary | ICD-10-CM | POA: Diagnosis not present

## 2014-08-09 DIAGNOSIS — I110 Hypertensive heart disease with heart failure: Secondary | ICD-10-CM | POA: Diagnosis not present

## 2014-08-09 DIAGNOSIS — E11329 Type 2 diabetes mellitus with mild nonproliferative diabetic retinopathy without macular edema: Secondary | ICD-10-CM | POA: Diagnosis not present

## 2014-08-09 DIAGNOSIS — R300 Dysuria: Secondary | ICD-10-CM | POA: Diagnosis not present

## 2014-08-09 DIAGNOSIS — E1165 Type 2 diabetes mellitus with hyperglycemia: Secondary | ICD-10-CM | POA: Diagnosis not present

## 2014-08-09 DIAGNOSIS — E039 Hypothyroidism, unspecified: Secondary | ICD-10-CM | POA: Diagnosis not present

## 2014-08-09 DIAGNOSIS — H25813 Combined forms of age-related cataract, bilateral: Secondary | ICD-10-CM | POA: Diagnosis not present

## 2014-08-16 ENCOUNTER — Encounter: Payer: Self-pay | Admitting: Podiatry

## 2014-08-16 ENCOUNTER — Telehealth: Payer: Self-pay | Admitting: *Deleted

## 2014-08-16 ENCOUNTER — Ambulatory Visit (INDEPENDENT_AMBULATORY_CARE_PROVIDER_SITE_OTHER): Payer: BLUE CROSS/BLUE SHIELD | Admitting: Podiatry

## 2014-08-16 VITALS — BP 124/67 | HR 81 | Resp 17 | Ht 59.0 in | Wt 335.4 lb

## 2014-08-16 DIAGNOSIS — R0989 Other specified symptoms and signs involving the circulatory and respiratory systems: Secondary | ICD-10-CM

## 2014-08-16 DIAGNOSIS — M2011 Hallux valgus (acquired), right foot: Secondary | ICD-10-CM | POA: Diagnosis not present

## 2014-08-16 DIAGNOSIS — E0842 Diabetes mellitus due to underlying condition with diabetic polyneuropathy: Secondary | ICD-10-CM

## 2014-08-16 NOTE — Patient Instructions (Signed)
Diabetes and Foot Care Diabetes may cause you to have problems because of poor blood supply (circulation) to your feet and legs. This may cause the skin on your feet to become thinner, break easier, and heal more slowly. Your skin may become dry, and the skin may peel and crack. You may also have nerve damage in your legs and feet causing decreased feeling in them. You may not notice minor injuries to your feet that could lead to infections or more serious problems. Taking care of your feet is one of the most important things you can do for yourself.  HOME CARE INSTRUCTIONS  Wear shoes at all times, even in the house. Do not go barefoot. Bare feet are easily injured.  Check your feet daily for blisters, cuts, and redness. If you cannot see the bottom of your feet, use a mirror or ask someone for help.  Wash your feet with warm water (do not use hot water) and mild soap. Then pat your feet and the areas between your toes until they are completely dry. Do not soak your feet as this can dry your skin.  Apply a moisturizing lotion or petroleum jelly (that does not contain alcohol and is unscented) to the skin on your feet and to dry, brittle toenails. Do not apply lotion between your toes.  Trim your toenails straight across. Do not dig under them or around the cuticle. File the edges of your nails with an emery board or nail file.  Do not cut corns or calluses or try to remove them with medicine.  Wear clean socks or stockings every day. Make sure they are not too tight. Do not wear knee-high stockings since they may decrease blood flow to your legs.  Wear shoes that fit properly and have enough cushioning. To break in new shoes, wear them for just a few hours a day. This prevents you from injuring your feet. Always look in your shoes before you put them on to be sure there are no objects inside.  Do not cross your legs. This may decrease the blood flow to your feet.  If you find a minor scrape,  cut, or break in the skin on your feet, keep it and the skin around it clean and dry. These areas may be cleansed with mild soap and water. Do not cleanse the area with peroxide, alcohol, or iodine.  When you remove an adhesive bandage, be sure not to damage the skin around it.  If you have a wound, look at it several times a day to make sure it is healing.  Do not use heating pads or hot water bottles. They may burn your skin. If you have lost feeling in your feet or legs, you may not know it is happening until it is too late.  Make sure your health care provider performs a complete foot exam at least annually or more often if you have foot problems. Report any cuts, sores, or bruises to your health care provider immediately. SEEK MEDICAL CARE IF:   You have an injury that is not healing.  You have cuts or breaks in the skin.  You have an ingrown nail.  You notice redness on your legs or feet.  You feel burning or tingling in your legs or feet.  You have pain or cramps in your legs and feet.  Your legs or feet are numb.  Your feet always feel cold. SEEK IMMEDIATE MEDICAL CARE IF:   There is increasing redness,   swelling, or pain in or around a wound.  There is a red line that goes up your leg.  Pus is coming from a wound.  You develop a fever or as directed by your health care provider.  You notice a bad smell coming from an ulcer or wound. Document Released: 05/04/2000 Document Revised: 01/07/2013 Document Reviewed: 10/14/2012 ExitCare Patient Information 2015 [employer]. This information is not intended to replace advice given to you by your health care provider. Make sure you discuss any questions you have with your health care provider.  

## 2014-08-16 NOTE — Progress Notes (Signed)
   Subjective:    Patient ID: Nina Howell, female    DOB: [birth year], 74 y.o.   MRN: [id number]  HPI Comments: N Diabetic foot exam L B/L feet D and O long-term C diabetic shoes need renewal A Diabetic pt T hx of diabetic shoe rx   Diabetes   Patient has a history of diabetic shoes dispensed and 2014. She denies any history of diabetic foot ulcers or claudication   Review of Systems  Genitourinary:       Pt states she recently had UTI.  All other systems reviewed and are negative.      Objective:   Physical Exam  Orientated 3  Vascular: DP pulses 1/4 bilaterally PT pulses 0/4 bilaterally Capillary reflex immediate bilaterally  Neurological: Sensation to 10 g monofilament wire intact 4/5 bilaterally Vibratory sensation intact bilaterally Ankle reflex equal and reactive bilaterally  Dermatological: No skin lesions noted bilaterally Texture and turgor within normal limits  Musculoskeletal: Patient walks with assistance of a cane Pes planus bilaterally HAV deformity right Hammertoe deformity second bilaterally      Assessment & Plan:   Assessment: Diminished posterior tibial pulses bilaterally may be suggestive peripheral arterial disease Protective sensation intact HAV deformity right Hammertoes second bilaterally  Plan: Reviewed results of diabetic foot examination today with patient will obtain certification for diabetic shoes from Dr. Delrae Rend for the indications of: HAV deformity right Hammertoe second bilaterally Decreased DP and PT pulses bilaterally  Notify patient of receipt of certification for diabetic shoes and schedule a follow-up visit

## 2014-08-19 NOTE — Telephone Encounter (Signed)
Saw Dr. Amalia Hailey today.  He said I have circulatory problems.  I haven't been to [facility] in a while.  Can you send them a referral.  I saw Dr. Scot Dock there before."

## 2014-08-20 ENCOUNTER — Other Ambulatory Visit: Payer: Self-pay | Admitting: Podiatry

## 2014-08-20 ENCOUNTER — Ambulatory Visit (HOSPITAL_COMMUNITY)
Admission: RE | Admit: 2014-08-20 | Discharge: 2014-08-20 | Disposition: A | Discharge status: Home / Self Care | Payer: BLUE CROSS/BLUE SHIELD | Source: Ambulatory Visit | Attending: Vascular Surgery | Admitting: Vascular Surgery

## 2014-08-20 DIAGNOSIS — M79604 Pain in right leg: Secondary | ICD-10-CM | POA: Insufficient documentation

## 2014-08-20 DIAGNOSIS — R0989 Other specified symptoms and signs involving the circulatory and respiratory systems: Secondary | ICD-10-CM | POA: Insufficient documentation

## 2014-08-26 DIAGNOSIS — M25561 Pain in right knee: Secondary | ICD-10-CM | POA: Diagnosis not present

## 2014-09-07 DIAGNOSIS — E119 Type 2 diabetes mellitus without complications: Secondary | ICD-10-CM | POA: Diagnosis not present

## 2014-09-07 DIAGNOSIS — N8502 Endometrial intraepithelial neoplasia [EIN]: Secondary | ICD-10-CM | POA: Diagnosis not present

## 2014-09-07 DIAGNOSIS — N72 Inflammatory disease of cervix uteri: Secondary | ICD-10-CM | POA: Diagnosis not present

## 2014-09-07 DIAGNOSIS — Z01419 Encounter for gynecological examination (general) (routine) without abnormal findings: Secondary | ICD-10-CM | POA: Diagnosis not present

## 2014-09-07 DIAGNOSIS — Z124 Encounter for screening for malignant neoplasm of cervix: Secondary | ICD-10-CM | POA: Diagnosis not present

## 2014-09-07 DIAGNOSIS — Z6841 Body Mass Index (BMI) 40.0 and over, adult: Secondary | ICD-10-CM | POA: Diagnosis not present

## 2014-09-07 DIAGNOSIS — R1909 Other intra-abdominal and pelvic swelling, mass and lump: Secondary | ICD-10-CM | POA: Diagnosis not present

## 2014-09-07 DIAGNOSIS — N888 Other specified noninflammatory disorders of cervix uteri: Secondary | ICD-10-CM | POA: Diagnosis not present

## 2014-09-30 DIAGNOSIS — N8502 Endometrial intraepithelial neoplasia [EIN]: Secondary | ICD-10-CM | POA: Diagnosis not present

## 2014-09-30 DIAGNOSIS — N858 Other specified noninflammatory disorders of uterus: Secondary | ICD-10-CM | POA: Diagnosis not present

## 2014-10-11 DIAGNOSIS — I272 Other secondary pulmonary hypertension: Secondary | ICD-10-CM | POA: Diagnosis not present

## 2014-10-11 DIAGNOSIS — R195 Other fecal abnormalities: Secondary | ICD-10-CM | POA: Diagnosis not present

## 2014-10-11 DIAGNOSIS — I5020 Unspecified systolic (congestive) heart failure: Secondary | ICD-10-CM | POA: Diagnosis not present

## 2014-10-17 NOTE — Progress Notes (Signed)
Cardiology Office Note   Date:  10/19/2014   ID:  Nina Howell, DOB 11/24/1940, MRN [id number]  PCP:  Maximino Greenland, MD  Cardiologist:   Jenkins Rouge, MD   No chief complaint on file.     History of Present Illness: Nina Howell is a 74 y.o. female who presents for evaluation of abnormal ECG.  Sees Dr Elsworth Soho for morbid obesity and OSA on CPAP Previously seen by Dr [name].  Echo 2011 done for dyspnea showed estimated PA pressure 56 mmHg and EF 45-50%  CRF DM and HTN Cath 11/28/09 by [name] normal coronary arteries  She was told by Dr [name] she needed nuclear study and she got worried when he was  [place] of [place] and couldn't schedule it. Not clear of indication for nuclear study  She is not having chest pain.  Activity limited by obesity and knee Pain.  Chronic dyspnea from OSA had been going to rehab and sees Dr Elsworth Soho regularly     Past Medical History  Diagnosis Date  . Cellulitis     ABDOMINAL WALL  . CHF (congestive heart failure)     DECOMPENSATED SYSTOLIC CHF  . Hypoventilation   . Hypokalemia   . CHF (congestive heart failure)   . Diabetes mellitus, type 2   . Hypothyroidism   . Varicose veins   . Morbid obesity   . IUD (intrauterine device) in place   . DJD (degenerative joint disease)   . Hypertension   . Anemia   . Hepatic lesion   . Asymptomatic cholelithiasis   . Hyperlipidemia   . CTS (carpal tunnel syndrome)   . 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 Acute pancreatitis     Past Surgical History  Procedure Laterality Date  . Shoulder surgery  2009    RIGHT SHOULDER  . Cardiac catheterization  2011    pt states "clean report"     Current Outpatient Prescriptions  Medication Sig Dispense Refill  . amLODipine (NORVASC) 5 MG tablet Take 5 mg by mouth daily.      Marland Kitchen aspirin 81 MG tablet Take 81 mg by mouth daily.    Marland Kitchen atorvastatin (LIPITOR) 10 MG tablet Take 20 mg by mouth every other day.     . canagliflozin (INVOKANA)  300 MG TABS tablet Take 300 mg by mouth daily before breakfast.    . Cholecalciferol (VITAMIN D3) 1000 UNITS CAPS Take 1 tablet by mouth daily.      Marland Kitchen CRANBERRY PO Take 3 tablets by mouth daily.     . cyanocobalamin 1000 MCG tablet 4,200 mcg. 2500 mcg daily    . insulin NPH-regular Human (NOVOLIN 70/30) (70-30) 100 UNIT/ML injection Inject 12 Units into the skin 2 (two) times daily with a meal. 12 units am 17 unit pm    . levothyroxine (SYNTHROID, LEVOTHROID) 50 MCG tablet Take 50 mcg by mouth daily.      . Liraglutide (VICTOZA) 18 MG/3ML SOLN 1.8 mg daily.     Marland Kitchen lisinopril (PRINIVIL,ZESTRIL) 2.5 MG tablet Take 2.5 mg by mouth daily.    . metFORMIN (GLUCOPHAGE) 1000 MG tablet Take 1,000 mg by mouth 2 (two) times daily with a meal.    . Multiple Vitamin (MULTIVITAMIN) capsule 1/2 tab daily    . potassium chloride SA (K-DUR,KLOR-CON) 20 MEQ tablet Take 20 mEq by mouth daily.     Marland Kitchen PRESCRIPTION MEDICATION Apply 1 application topically daily as needed. Talconex for dry skin on scalp    .  sertraline (ZOLOFT) 50 MG tablet Take 50 mg by mouth at bedtime.      No current facility-administered medications for this visit.    Allergies:   Celebrex; Atorvastatin; Cephalexin; Cephalosporins; Oxycodone-acetaminophen; and Penicillins    Social History:  The patient  reports that she has never smoked. She has never used smokeless tobacco. She reports that she does not drink alcohol or use illicit drugs.   Family History:  The patient's family history includes Cancer in her mother; Diabetes in her father; Leukemia in her father.    ROS:  Please see the history of present illness.   Otherwise, review of systems are positive for none.   All other systems are reviewed and negative.    PHYSICAL EXAM: VS:  BP 120/78 mmHg  Pulse 98  Ht 5\' 1"  (1.549 m)  Wt 153.316 kg (338 lb)  BMI 63.90 kg/m2  SpO2 89% , BMI Body mass index is 63.9 kg/(m^2). Morbidly obese female  Healthy:  appears stated age 64:  normal Neck supple with no adenopathy JVP normal no bruits no thyromegaly Lungs clear with no wheezing and good diaphragmatic motion Heart:  S1/S2 no murmur, no rub, gallop or click PMI normal Abdomen: benighn, BS positve, no tenderness, no AAA no bruit.  No HSM or HJR Distal pulses intact with no bruits No edema Neuro non-focal Skin warm and dry No muscular weakness    EKG:  06/19/11  SR rate 60  Diffuse biphasic T wave changes    Recent Labs: No results found for requested labs within last 365 days.    Lipid Panel No results found for: CHOL, TRIG, HDL, CHOLHDL, VLDL, LDLCALC, LDLDIRECT    Wt Readings from Last 3 Encounters:  10/19/14 153.316 kg (338 lb)  08/16/14 152.136 kg (335 lb 6.4 oz)  01/15/14 152.862 kg (337 lb)      Other studies Reviewed: Additional studies/ records that were reviewed today include:  Office notes Dr Louanna Raw and Dr [name] .    ASSESSMENT AND PLAN:  1.  Abnormal ECG:  Chronic had biphasic T;s during cath in 2011 and had normal coronaries.  No need for nuclear testing at this time no symptoms Ok to have colonoscopy with Dr Benson Norway if needed 2. Low EF:  Previous echo with  EF 45-50%  Needs f/u will order  3. DM: Discussed low carb diet.  Target hemoglobin A1c is 6.5 or less.  Continue current medications. 4. OSA:  F/u Dr Elsworth Soho major issue Compliant with CPAP last 3 years 5. Chol:  On statin labs with primary 6 Bipolar disease seems a bit on manic side with flight of ideas continue current meds   Current medicines are reviewed at length with the patient today.  The patient does not have concerns regarding medicines.  The following changes have been made:  no change  Labs/ tests ordered today include:  Echo  No orders of the defined types were placed in this encounter.     Disposition:   FU with me in 6 months      Signed, Jenkins Rouge, MD  10/19/2014 10:11 AM    [facility] [address], [place], [place]   [address] Phone: [phone]; Fax: [phone]

## 2014-10-19 ENCOUNTER — Encounter: Payer: Self-pay | Admitting: Cardiovascular Disease

## 2014-10-19 ENCOUNTER — Ambulatory Visit (INDEPENDENT_AMBULATORY_CARE_PROVIDER_SITE_OTHER): Payer: BLUE CROSS/BLUE SHIELD | Admitting: Cardiovascular Disease

## 2014-10-19 VITALS — BP 120/78 | HR 98 | Ht 61.0 in | Wt 338.0 lb

## 2014-10-19 DIAGNOSIS — R0600 Dyspnea, unspecified: Secondary | ICD-10-CM

## 2014-10-19 NOTE — Patient Instructions (Signed)
Medication Instructions:  NO CHANGES  Labwork: NONE  Testing/Procedures: Your physician has requested that you have an echocardiogram. Echocardiography is a painless test that uses sound waves to create images of your heart. It provides your doctor with information about the size and shape of your heart and how well your heart's chambers and valves are working. This procedure takes approximately one hour. There are no restrictions for this procedure.    Follow-Up: Your physician wants you to follow-up in: 6 MONTHS  WITH  DR NISHAN  You will receive a reminder letter in the mail two months in advance. If you don't receive a letter, please call our office to schedule the follow-up appointment.   Any Other Special Instructions Will Be Listed Below (If Applicable).   

## 2014-10-27 ENCOUNTER — Ambulatory Visit (HOSPITAL_COMMUNITY): Payer: BLUE CROSS/BLUE SHIELD | Attending: Cardiology

## 2014-10-27 ENCOUNTER — Other Ambulatory Visit: Payer: Self-pay

## 2014-10-27 DIAGNOSIS — I071 Rheumatic tricuspid insufficiency: Secondary | ICD-10-CM | POA: Diagnosis not present

## 2014-10-27 DIAGNOSIS — R0600 Dyspnea, unspecified: Secondary | ICD-10-CM

## 2014-10-27 MED ORDER — PERFLUTREN LIPID MICROSPHERE
3.0000 mL | Freq: Once | INTRAVENOUS | Status: AC
  Administered 2014-10-27: 3 mL via INTRAVENOUS

## 2014-11-05 ENCOUNTER — Ambulatory Visit: Payer: Medicare Other | Admitting: *Deleted

## 2014-11-05 DIAGNOSIS — E0842 Diabetes mellitus due to underlying condition with diabetic polyneuropathy: Secondary | ICD-10-CM

## 2014-11-05 NOTE — Progress Notes (Signed)
Patient ID: Nina Howell, female   DOB: Sep 18, 1940, 74 y.o.   MRN: [id number] BEING MEASURED AND SCANNED FOR DIABETIC SHOES AND INSERTS

## 2014-11-08 DIAGNOSIS — L299 Pruritus, unspecified: Secondary | ICD-10-CM | POA: Diagnosis not present

## 2014-11-08 DIAGNOSIS — I110 Hypertensive heart disease with heart failure: Secondary | ICD-10-CM | POA: Diagnosis not present

## 2014-11-08 DIAGNOSIS — R195 Other fecal abnormalities: Secondary | ICD-10-CM | POA: Diagnosis not present

## 2014-11-08 DIAGNOSIS — I5022 Chronic systolic (congestive) heart failure: Secondary | ICD-10-CM | POA: Diagnosis not present

## 2014-11-08 DIAGNOSIS — I272 Other secondary pulmonary hypertension: Secondary | ICD-10-CM | POA: Diagnosis not present

## 2014-11-08 DIAGNOSIS — I5020 Unspecified systolic (congestive) heart failure: Secondary | ICD-10-CM | POA: Diagnosis not present

## 2014-11-08 DIAGNOSIS — E1165 Type 2 diabetes mellitus with hyperglycemia: Secondary | ICD-10-CM | POA: Diagnosis not present

## 2014-11-15 ENCOUNTER — Other Ambulatory Visit: Payer: Self-pay | Admitting: Gastroenterology

## 2014-11-15 DIAGNOSIS — L0101 Non-bullous impetigo: Secondary | ICD-10-CM | POA: Diagnosis not present

## 2014-11-15 DIAGNOSIS — L0889 Other specified local infections of the skin and subcutaneous tissue: Secondary | ICD-10-CM | POA: Diagnosis not present

## 2014-11-15 DIAGNOSIS — L0100 Impetigo, unspecified: Secondary | ICD-10-CM | POA: Diagnosis not present

## 2014-11-15 DIAGNOSIS — L308 Other specified dermatitis: Secondary | ICD-10-CM | POA: Diagnosis not present

## 2014-11-23 ENCOUNTER — Telehealth: Payer: Self-pay | Admitting: Internal Medicine

## 2014-11-23 DIAGNOSIS — R197 Diarrhea, unspecified: Secondary | ICD-10-CM | POA: Diagnosis not present

## 2014-11-23 DIAGNOSIS — E1142 Type 2 diabetes mellitus with diabetic polyneuropathy: Secondary | ICD-10-CM | POA: Diagnosis not present

## 2014-11-23 DIAGNOSIS — Z6841 Body Mass Index (BMI) 40.0 and over, adult: Secondary | ICD-10-CM | POA: Diagnosis not present

## 2014-11-23 DIAGNOSIS — R21 Rash and other nonspecific skin eruption: Secondary | ICD-10-CM | POA: Diagnosis not present

## 2014-11-23 DIAGNOSIS — E039 Hypothyroidism, unspecified: Secondary | ICD-10-CM | POA: Diagnosis not present

## 2014-11-23 DIAGNOSIS — Z794 Long term (current) use of insulin: Secondary | ICD-10-CM | POA: Diagnosis not present

## 2014-11-23 NOTE — Telephone Encounter (Signed)
LMTCB

## 2014-11-24 NOTE — Telephone Encounter (Signed)
Patient says she went to see dermatologist and endocrinologist yesterday regarding blisters.  Doctors recommended that patient see Dr. Annamaria Boots for her allergies.  Patient scheduled to see Dr. Annamaria Boots tomorrow at [place].  Nothing further needed.

## 2014-11-24 NOTE — Telephone Encounter (Signed)
Pt returned call - [phone]

## 2014-11-25 ENCOUNTER — Other Ambulatory Visit (INDEPENDENT_AMBULATORY_CARE_PROVIDER_SITE_OTHER): Payer: BLUE CROSS/BLUE SHIELD

## 2014-11-25 ENCOUNTER — Ambulatory Visit (INDEPENDENT_AMBULATORY_CARE_PROVIDER_SITE_OTHER): Payer: BLUE CROSS/BLUE SHIELD | Admitting: Internal Medicine

## 2014-11-25 ENCOUNTER — Encounter: Payer: Self-pay | Admitting: Internal Medicine

## 2014-11-25 VITALS — BP 126/80 | HR 74 | Ht 60.5 in | Wt 345.4 lb

## 2014-11-25 DIAGNOSIS — R21 Rash and other nonspecific skin eruption: Secondary | ICD-10-CM | POA: Diagnosis not present

## 2014-11-25 DIAGNOSIS — L120 Bullous pemphigoid: Secondary | ICD-10-CM

## 2014-11-25 LAB — SEDIMENTATION RATE: SED RATE: 48 mm/h — AB (ref 0–22)

## 2014-11-25 LAB — CBC WITH DIFFERENTIAL/PLATELET
BASOS PCT: 0.3 % (ref 0.0–3.0)
Basophils Absolute: 0 10*3/uL (ref 0.0–0.1)
EOS ABS: 0.4 10*3/uL (ref 0.0–0.7)
Eosinophils Relative: 3 % (ref 0.0–5.0)
HCT: 40.6 % (ref 36.0–46.0)
HEMOGLOBIN: 13.3 g/dL (ref 12.0–15.0)
LYMPHS ABS: 2.4 10*3/uL (ref 0.7–4.0)
LYMPHS PCT: 18.1 % (ref 12.0–46.0)
MCHC: 32.8 g/dL (ref 30.0–36.0)
MCV: 89 fl (ref 78.0–100.0)
Monocytes Absolute: 0.7 10*3/uL (ref 0.1–1.0)
Monocytes Relative: 5 % (ref 3.0–12.0)
NEUTROS ABS: 9.8 10*3/uL — AB (ref 1.4–7.7)
NEUTROS PCT: 73.6 % (ref 43.0–77.0)
Platelets: 361 10*3/uL (ref 150.0–400.0)
RBC: 4.56 Mil/uL (ref 3.87–5.11)
RDW: 16.3 % — ABNORMAL HIGH (ref 11.5–15.5)
WBC: 13.3 10*3/uL — ABNORMAL HIGH (ref 4.0–10.5)

## 2014-11-25 NOTE — Progress Notes (Signed)
11/25/14- 73 yoF Referred by Dr. Gertie Exon for rash that has spread all over.  Medical problems include OSA/ VPAP auto-Dr Elsworth Soho, hypothyroid, DM2, morbid obesity, OHS. Was on ACEI- lisinopril 2/16- quit 2 weeks ago.Marland Kitchen Hx  Atorvastatin intolerance- but listed as active med, rash/ hives with cephalexin. Pruritic rash involving arms, thighs, back with onset around mid June, new to her. Dermatologist did a procedure, apparently not a biopsy, and treated with clobetasol topically. No benefit so far. Hydroxyzine helps itching some. Lesions started as "water blisters". Denies history of seasonal allergic rhinitis or asthma. No recognized food allergy. Denies systemic symptoms of fever, adenopathy or obvious infection.  Prior to Admission medications   Medication Sig Start Date End Date Taking? Authorizing Provider  amLODipine (NORVASC) 5 MG tablet Take 5 mg by mouth daily.     Yes Historical Provider, MD  aspirin 81 MG tablet Take 81 mg by mouth daily.   Yes Historical Provider, MD  atorvastatin (LIPITOR) 10 MG tablet Take 20 mg by mouth every other day.    Yes Historical Provider, MD  Cholecalciferol (VITAMIN D3) 1000 UNITS CAPS Take 1 tablet by mouth daily.     Yes Historical Provider, MD  CRANBERRY PO Take 3 tablets by mouth daily.    Yes Historical Provider, MD  cyanocobalamin 1000 MCG tablet 4,200 mcg. 2500 mcg daily   Yes Historical Provider, MD  fluocinonide (LIDEX) 0.05 % external solution Apply 1 application topically 2 (two) times daily.   Yes Historical Provider, MD  insulin NPH-regular Human (NOVOLIN 70/30) (70-30) 100 UNIT/ML injection Inject into the skin 2 (two) times daily with a meal. 14 units am 17 unit pm   Yes Historical Provider, MD  levothyroxine (SYNTHROID, LEVOTHROID) 50 MCG tablet Take 50 mcg by mouth daily.     Yes Historical Provider, MD  metFORMIN (GLUCOPHAGE) 1000 MG tablet Take 1,000 mg by mouth 2 (two) times daily with a meal.   Yes Historical Provider, MD  Multiple Vitamin  (MULTIVITAMIN) capsule 1/2 tab daily   Yes Historical Provider, MD  potassium chloride SA (K-DUR,KLOR-CON) 20 MEQ tablet Take 20 mEq by mouth daily.    Yes Historical Provider, MD  sertraline (ZOLOFT) 50 MG tablet Take 50 mg by mouth at bedtime.    Yes Historical Provider, MD   Past Medical History  Diagnosis Date  . Cellulitis     ABDOMINAL WALL  . CHF (congestive heart failure)     DECOMPENSATED SYSTOLIC CHF  . Hypoventilation   . Hypokalemia   . CHF (congestive heart failure)   . Diabetes mellitus, type 2   . Hypothyroidism   . Varicose veins   . Morbid obesity   . IUD (intrauterine device) in place   . DJD (degenerative joint disease)   . Hypertension   . Anemia   . Hepatic lesion   . Asymptomatic cholelithiasis   . Hyperlipidemia   . CTS (carpal tunnel syndrome)   . 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 Acute pancreatitis    Past Surgical History  Procedure Laterality Date  . Shoulder surgery  2009    RIGHT SHOULDER  . Cardiac catheterization  2011    pt states "clean report"   .fdamh History   Social History  . Marital Status: Married    Spouse Name: N/A  . Number of Children: 4  . Years of Education: N/A   Occupational History  . retired     Education officer, museum   Social History Main Topics  .  Smoking status: Never Smoker   . Smokeless tobacco: Never Used  . Alcohol Use: No  . Drug Use: No  . Sexual Activity: Not on file   Other Topics Concern  . Not on file   Social History Narrative   ROS-see HPI   Negative unless "+" Constitutional:    weight loss, night sweats, fevers, chills, fatigue, lassitude. HEENT:    headaches, difficulty swallowing, tooth/dental problems, sore throat,       Sneezing,+ itching, ear ache, nasal congestion, post nasal drip, snoring CV:    chest pain, orthopnea, PND, swelling in lower extremities, anasarca,                                  dizziness, palpitations Resp:   +shortness of breath with exertion or at  rest.                productive cough,   non-productive cough, coughing up of blood.              change in color of mucus.  wheezing.   Skin:  + HPI GI:  No-   heartburn, indigestion, abdominal pain, nausea, vomiting, diarrhea,                 change in bowel habits, loss of appetite GU: dysuria, change in color of urine, no urgency or frequency.   flank pain. MS:   +joint pain, stiffness, decreased range of motion, back pain. Neuro-     nothing unusual Psych:  change in mood or affect.  depression or anxiety.   memory loss.  OBJ- Physical Exam General- Alert, Oriented, Affect-appropriate, Distress- none acute, + morbidly obese Skin- + scattered excoriated vesicular rash. Especially on right wrist there are tense fluid filled vesicles an eighth of an inch in diameter on a non-inflamed base. Lymphadenopathy- none Head- atraumatic            Eyes- Gross vision intact, PERRLA, conjunctivae and secretions clear            Ears- Hearing, canals-normal            Nose- Clear, no-Septal dev, mucus, polyps, erosion, perforation             Throat- Mallampati II , mucosa clear , drainage- none, tonsils- atrophic Neck- flexible , trachea midline, no stridor , thyroid nl, carotid no bruit Chest - symmetrical excursion , unlabored           Heart/CV- RRR , no murmur , no gallop  , no rub, nl s1 s2                           - JVD- none , edema- none, stasis changes- none, varices- none           Lung- clear to P&A, wheeze- none, cough- none , dullness-none, rub- none           Chest wall-  Abd-  Br/ Gen/ Rectal- Not done, not indicated Extrem- cyanosis- none, clubbing, none, atrophy- none, strength- nl Neuro- grossly intact to observation

## 2014-11-25 NOTE — Patient Instructions (Addendum)
Order- labs- ANA, sed rate, Allergy profile, Food IgE profile, CBC w diff    Dx rash                     Indirect Immunoflurorescence                     And ELISA for basement membrane antibodies  For bullous pemphigoid  Because of your diabetes, we will stay away from steroid pills or shots for now  [facility] to try the [place] you have used on your rash Ok to try otc Caladryl ointment, like for poison ivy, just to soothe it  Suggest you follow up with your dermatologist

## 2014-11-26 LAB — EPIDERMAL ANTIBODIES RFLX TITER

## 2014-11-26 LAB — ALLERGY FULL PROFILE
Allergen, D pternoyssinus,d7: <0.1 kU/L
Bahia Grass: <0.1 kU/L
Box Elder IgE: <0.1 kU/L
Cat Dander: <0.1 kU/L
Common Ragweed: <0.1 kU/L
Curvularia lunata: <0.1 kU/L
Dog Dander: <0.1 kU/L
Elm IgE: <0.1 kU/L
G009 Red Top: <0.1 kU/L
House Dust Hollister: <0.1 kU/L
Lamb's Quarters: <0.1 kU/L
Plantain: <0.1 kU/L
Stemphylium Botryosum: <0.1 kU/L
Sycamore Tree: <0.1 kU/L
Timothy Grass: <0.1 kU/L

## 2014-11-26 LAB — ALLERGEN FOOD PROFILE SPECIFIC IGE
Apple: <0.1 kU/L
Chicken IgE: <0.1 kU/L
Egg White IgE: <0.1 kU/L
IGE (IMMUNOGLOBULIN E), SERUM: 197 kU/L — AB (ref <115)
Orange: <0.1 kU/L
Peanut IgE: <0.1 kU/L
Shrimp IgE: <0.1 kU/L
Soybean IgE: <0.1 kU/L
Tomato IgE: <0.1 kU/L

## 2014-11-26 LAB — ANA: Anti Nuclear Antibody(ANA): NEGATIVE

## 2014-11-27 DIAGNOSIS — R21 Rash and other nonspecific skin eruption: Secondary | ICD-10-CM | POA: Insufficient documentation

## 2014-11-27 NOTE — Assessment & Plan Note (Addendum)
This doesn't look like typical hives or atopic dermatitis and I suspect it is not a simple allergic reaction. I wonder about something like bullous pemphigoid and we can send available labs. She has already seen a dermatologist who will be in a better position than I to define this problem, including skin biopsy if appropriate.

## 2014-11-30 ENCOUNTER — Telehealth: Payer: Self-pay | Admitting: Internal Medicine

## 2014-11-30 DIAGNOSIS — H608X3 Other otitis externa, bilateral: Secondary | ICD-10-CM | POA: Diagnosis not present

## 2014-11-30 DIAGNOSIS — H903 Sensorineural hearing loss, bilateral: Secondary | ICD-10-CM | POA: Diagnosis not present

## 2014-11-30 NOTE — Telephone Encounter (Signed)
Called and spoke to pt. Pt is requesting another treatment as the MOM and caladryl are not helping. Pt also requesting the results of the labs. Pt stated she has not yet contacted her dermatologist regarding the issue. Informed pt to contact her dermatologist per CY at last OV on 7.9. Pt verbalized understanding. Pt stated she will call back after speaking with her dermatologist.   Dr. Annamaria Boots please advise on labs. Thanks.

## 2014-11-30 NOTE — Telephone Encounter (Signed)
Called and spoke to pt. Informed pt that CY is unavailable until next week. Pt verbalized understanding and stated she spoke with her dermatologist and they are going to get back with her regarding the rash.   Will send to CY as FYI.

## 2014-12-01 ENCOUNTER — Ambulatory Visit (INDEPENDENT_AMBULATORY_CARE_PROVIDER_SITE_OTHER): Payer: BLUE CROSS/BLUE SHIELD | Admitting: Podiatry

## 2014-12-01 DIAGNOSIS — M2042 Other hammer toe(s) (acquired), left foot: Secondary | ICD-10-CM | POA: Diagnosis not present

## 2014-12-01 DIAGNOSIS — M2011 Hallux valgus (acquired), right foot: Secondary | ICD-10-CM

## 2014-12-01 DIAGNOSIS — M2012 Hallux valgus (acquired), left foot: Secondary | ICD-10-CM | POA: Diagnosis not present

## 2014-12-01 DIAGNOSIS — E1149 Type 2 diabetes mellitus with other diabetic neurological complication: Secondary | ICD-10-CM

## 2014-12-01 DIAGNOSIS — R0989 Other specified symptoms and signs involving the circulatory and respiratory systems: Secondary | ICD-10-CM | POA: Diagnosis not present

## 2014-12-01 DIAGNOSIS — E1140 Type 2 diabetes mellitus with diabetic neuropathy, unspecified: Secondary | ICD-10-CM | POA: Diagnosis not present

## 2014-12-01 DIAGNOSIS — M2041 Other hammer toe(s) (acquired), right foot: Secondary | ICD-10-CM

## 2014-12-01 NOTE — Progress Notes (Signed)
Quick Note:  Called and spoke to pt. Reviewed results and recs per CY. Pt voiced understanding and had no further questions. ______

## 2014-12-01 NOTE — Patient Instructions (Signed)

## 2014-12-01 NOTE — Progress Notes (Signed)
   Subjective:    Patient ID: Nina Howell, female    DOB: 10-Aug-1940, 74 y.o.   MRN: [id number]  HPI  Patient presents for diabetic shoe pick up shoes are tried on for good fit.  Patient received 1 pair of Orthofeet [place] in women's 10 wide and 3 pairs custom diabetic inserts.  Verbal and written break in and wear instructions given.  Patient will follow up for regular scheduled care.       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

## 2014-12-02 DIAGNOSIS — L089 Local infection of the skin and subcutaneous tissue, unspecified: Secondary | ICD-10-CM | POA: Diagnosis not present

## 2014-12-02 DIAGNOSIS — L308 Other specified dermatitis: Secondary | ICD-10-CM | POA: Diagnosis not present

## 2014-12-02 DIAGNOSIS — L120 Bullous pemphigoid: Secondary | ICD-10-CM | POA: Diagnosis not present

## 2014-12-02 LAB — IMMUNOFLUORESCENCE,INDIRECT

## 2014-12-07 ENCOUNTER — Telehealth: Payer: Self-pay | Admitting: Cardiovascular Disease

## 2014-12-07 ENCOUNTER — Telehealth: Payer: Self-pay | Admitting: Internal Medicine

## 2014-12-07 DIAGNOSIS — R0600 Dyspnea, unspecified: Secondary | ICD-10-CM

## 2014-12-07 MED ORDER — TORSEMIDE 20 MG PO TABS: ORAL_TABLET | ORAL | Status: DC

## 2014-12-07 NOTE — Telephone Encounter (Signed)
The environmental and food allergy profiles were negative, except the total allergy antibody level (IgE) was higher than normal. By itself this doesn't tell us anything to do. 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 That is the diagnosis I had suspected at our office visit. The final report on that won't be out until Friday. It sounds as if the dermatologist has also identified the problem and is treating it. Nina Howell is welcome to come back if we can help in the future, but doesn't need a return at this time.p

## 2014-12-07 NOTE — Telephone Encounter (Signed)
Pt states Dr Charleston Poot, doctor treating auto immune disease, is asking Dr Johnsie Cancel to change lasix to a different medication. Pt states she was not given any names of alternative medications.  Pt advised I will forward to Dr Johnsie Cancel for review.

## 2014-12-07 NOTE — Telephone Encounter (Signed)
Pt states she has been diagnosed with an autoimmune disease -pt has blisters all over her body. Pt was told that lasix may be contributing to autoimmune disease.

## 2014-12-07 NOTE — Telephone Encounter (Signed)
lmomtcb x1 

## 2014-12-07 NOTE — Telephone Encounter (Signed)
Stop lasix doubt its causing autoimmune issues.  Start demedex 10 mg  BMET in 2 weeks

## 2014-12-07 NOTE — Telephone Encounter (Signed)
Pt returning call.Nina Howell ° °

## 2014-12-07 NOTE — Telephone Encounter (Signed)
Called made pt aware of below. She verbalized understanding and needed nothing further 

## 2014-12-07 NOTE — Telephone Encounter (Signed)
Pt returned call [phone]

## 2014-12-07 NOTE — Telephone Encounter (Signed)
Spoke with pt. She is requesting her blood work for allergy profile.  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 Pt now taking prednisone daily d/t blisters all over her. Please advise Dr. Annamaria Boots thanks

## 2014-12-07 NOTE — Telephone Encounter (Signed)
New message      Pt c/o medication issue:  1. Name of Medication: furosemide  2. How are you currently taking this medication (dosage and times per day)? 1 tablet daily----prior to June 20th she took 3 tablets daily  3. Are you having a reaction (difficulty breathing--STAT)? no  4. What is your medication issue? Pt was diagnosed with an autoimmune problem (blisters everywhere).  The doctors think the furosemide may make it worse

## 2014-12-07 NOTE — Telephone Encounter (Signed)
Pt advised,verbalized  understanding, BMET scheduled for 12/21/14.

## 2014-12-13 ENCOUNTER — Telehealth: Payer: Self-pay | Admitting: Cardiovascular Disease

## 2014-12-13 NOTE — Telephone Encounter (Signed)
Pt c/o swelling: STAT is pt has developed SOB within 24 hours  1. How long have you been experiencing swelling? 12/10/14  2. Where is the swelling located? Lower legs and ankles  3.  Are you currently taking a "fluid pill"?   Yes   4.  Are you currently SOB? Pt states yes, but she do not sound SOB  5.  Have you traveled recently? no

## 2014-12-13 NOTE — Telephone Encounter (Signed)
Can take whole tab whenever swelling is bad

## 2014-12-13 NOTE — Telephone Encounter (Signed)
SPOKE WITH PT  HAD SOME  SWELLING NOTED YESTERDAY  IN  FEET AND ANKLES  AND  H AD  TAKEN   WHOLE TAB OF TORSEMIDE  INSTEAD OF  1/2 TAB. PER PT  HELPED  QUIT  A BIT   YESTERDAY  FEELS  FINE  TODAY    AND  HAS ONLY TAKEN  1/2 TAB  AS DIRECTED.  PER PT    BECAME CONCERNED  SINCE   CHANGED MED  ON  OWN  YESTERDAY   PT  WANTED TO  KNOW  WHAT  TO DO   IN  THE EVENT  HAD  ANOTHER  EPISODE OF  SWELLING  WILL FORWARD  TO DR Johnsie Cancel FOR  REVIEW./CY

## 2014-12-14 DIAGNOSIS — L120 Bullous pemphigoid: Secondary | ICD-10-CM | POA: Diagnosis not present

## 2014-12-14 DIAGNOSIS — E1165 Type 2 diabetes mellitus with hyperglycemia: Secondary | ICD-10-CM | POA: Diagnosis not present

## 2014-12-14 DIAGNOSIS — I5022 Chronic systolic (congestive) heart failure: Secondary | ICD-10-CM | POA: Diagnosis not present

## 2014-12-14 DIAGNOSIS — I110 Hypertensive heart disease with heart failure: Secondary | ICD-10-CM | POA: Diagnosis not present

## 2014-12-15 NOTE — Telephone Encounter (Signed)
PT  AWARE./CY 

## 2014-12-17 ENCOUNTER — Encounter (HOSPITAL_COMMUNITY): Payer: Self-pay | Admitting: *Deleted

## 2014-12-17 ENCOUNTER — Telehealth: Payer: Self-pay | Admitting: Internal Medicine

## 2014-12-17 DIAGNOSIS — L120 Bullous pemphigoid: Secondary | ICD-10-CM | POA: Diagnosis not present

## 2014-12-17 DIAGNOSIS — Z79899 Other long term (current) drug therapy: Secondary | ICD-10-CM | POA: Diagnosis not present

## 2014-12-17 DIAGNOSIS — D692 Other nonthrombocytopenic purpura: Secondary | ICD-10-CM | POA: Diagnosis not present

## 2014-12-17 NOTE — Telephone Encounter (Signed)
Called Nina Howell and she needed pt lab results faxed over. I have done so. Nothing further needed

## 2014-12-21 ENCOUNTER — Other Ambulatory Visit (INDEPENDENT_AMBULATORY_CARE_PROVIDER_SITE_OTHER): Payer: BLUE CROSS/BLUE SHIELD | Admitting: *Deleted

## 2014-12-21 DIAGNOSIS — R0600 Dyspnea, unspecified: Secondary | ICD-10-CM

## 2014-12-21 LAB — BASIC METABOLIC PANEL
BUN: 14 mg/dL (ref 6–23)
CO2: 36 meq/L — AB (ref 19–32)
Calcium: 9.4 mg/dL (ref 8.4–10.5)
Chloride: 96 mEq/L (ref 96–112)
Creatinine, Ser: 0.66 mg/dL (ref 0.40–1.20)
GFR: 112.95 mL/min (ref >60.00)
Glucose, Bld: 153 mg/dL — ABNORMAL HIGH (ref 70–99)
Potassium: 3.5 mEq/L (ref 3.5–5.1)
SODIUM: 139 meq/L (ref 135–145)

## 2014-12-21 NOTE — Addendum Note (Signed)
Addended by: Eulis Foster on: 12/21/2014 07:43 AM   Modules accepted: Orders

## 2014-12-22 NOTE — Patient Outreach (Signed)
[place] [facility]) Care Management  12/22/2014  Nina Howell 1941/02/07 [id number]   Referral from MD, assigned to Mariann Laster, RN for patient outreach.  Nina Kath L. [name], [place] Care Management Assistant

## 2014-12-23 NOTE — H&P (Signed)
  Nina Howell HPI: She is well at this time. She was evaluated by Dr. Johnsie Cancel for her abnormal ECG. In the past she had normal coronaries with a cardiac cath in 2011. Dr. Johnsie Cancel cleared her for the colonoscopy.  Past Medical History  Diagnosis Date  . Cellulitis     ABDOMINAL WALL  . CHF (congestive heart failure)     DECOMPENSATED SYSTOLIC CHF  . Hypoventilation   . Hypokalemia   . CHF (congestive heart failure)   . Diabetes mellitus, type 2   . Hypothyroidism   . Varicose veins   . Morbid obesity   . IUD (intrauterine device) in place   . DJD (degenerative joint disease)   . Hypertension   . Anemia   . Hepatic lesion   . Asymptomatic cholelithiasis   . Hyperlipidemia   . CTS (carpal tunnel syndrome)   . 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 Acute pancreatitis   . Sleep apnea     cpap  . Rash and nonspecific skin eruption     all over- 12-17-14 "drying in _ Auto immune problem"-seeing dermalogist    Past Surgical History  Procedure Laterality Date  . Shoulder surgery  2009    RIGHT SHOULDER  . Cardiac catheterization  2011    pt states "clean report"  . Tubal ligation    . Dilation and curettage of uterus      Family History  Problem Relation Age of Onset  . Cancer Mother     throat  . Diabetes Father   . Leukemia Father     Social History:  reports that she has never smoked. She has never used smokeless tobacco. She reports that she does not drink alcohol or use illicit drugs.  Allergies:  Allergies  Allergen Reactions  . Celebrex [Celecoxib] Swelling  . Atorvastatin Other (See Comments)  . Cephalexin     REACTION: hives  . Cephalosporins     REACTION: rash  . Oxycodone-Acetaminophen Nausea Only  . Penicillins Other (See Comments)    Not known    Medications: Scheduled: Continuous:  No results found for this or any previous visit (from the past 24 hour(s)).   No results found.  ROS:  As stated above in the HPI otherwise  negative.  There were no vitals taken for this visit.    PE: Gen: NAD, Alert and Oriented HEENT:  [place]/AT, EOMI Neck: Supple, no LAD Lungs: CTA Bilaterally CV: RRR without M/G/R ABM: Soft, NTND, +BS Ext: No C/C/E  Assessment/Plan: 1) Screening colonoscopy.  Nina Howell D 12/23/2014, 12:20 PM

## 2014-12-24 ENCOUNTER — Ambulatory Visit (HOSPITAL_COMMUNITY): Payer: BLUE CROSS/BLUE SHIELD | Admitting: Certified Registered Nurse Anesthetist

## 2014-12-24 ENCOUNTER — Encounter (HOSPITAL_COMMUNITY)
Admission: RE | Disposition: A | Discharge status: Home / Self Care | Payer: Self-pay | Source: Ambulatory Visit | Attending: Gastroenterology

## 2014-12-24 ENCOUNTER — Telehealth: Payer: Self-pay | Admitting: Cardiovascular Disease

## 2014-12-24 ENCOUNTER — Telehealth: Payer: Self-pay | Admitting: *Deleted

## 2014-12-24 ENCOUNTER — Ambulatory Visit (HOSPITAL_COMMUNITY)
Admission: RE | Admit: 2014-12-24 | Discharge: 2014-12-24 | Disposition: A | Discharge status: Home / Self Care | Payer: BLUE CROSS/BLUE SHIELD | Source: Ambulatory Visit | Attending: Gastroenterology | Admitting: Gastroenterology

## 2014-12-24 ENCOUNTER — Encounter (HOSPITAL_COMMUNITY): Payer: Self-pay | Admitting: *Deleted

## 2014-12-24 DIAGNOSIS — Z1211 Encounter for screening for malignant neoplasm of colon: Secondary | ICD-10-CM | POA: Diagnosis not present

## 2014-12-24 DIAGNOSIS — E039 Hypothyroidism, unspecified: Secondary | ICD-10-CM | POA: Diagnosis not present

## 2014-12-24 DIAGNOSIS — D123 Benign neoplasm of transverse colon: Secondary | ICD-10-CM | POA: Diagnosis not present

## 2014-12-24 DIAGNOSIS — R195 Other fecal abnormalities: Secondary | ICD-10-CM | POA: Diagnosis present

## 2014-12-24 DIAGNOSIS — I10 Essential (primary) hypertension: Secondary | ICD-10-CM | POA: Insufficient documentation

## 2014-12-24 DIAGNOSIS — E785 Hyperlipidemia, unspecified: Secondary | ICD-10-CM | POA: Diagnosis not present

## 2014-12-24 DIAGNOSIS — E119 Type 2 diabetes mellitus without complications: Secondary | ICD-10-CM | POA: Diagnosis not present

## 2014-12-24 DIAGNOSIS — I5022 Chronic systolic (congestive) heart failure: Secondary | ICD-10-CM | POA: Insufficient documentation

## 2014-12-24 DIAGNOSIS — R0600 Dyspnea, unspecified: Secondary | ICD-10-CM

## 2014-12-24 HISTORY — DX: Sleep apnea, unspecified: G47.30

## 2014-12-24 HISTORY — DX: Rash and other nonspecific skin eruption: R21

## 2014-12-24 HISTORY — PX: COLONOSCOPY WITH PROPOFOL: SHX5780

## 2014-12-24 LAB — GLUCOSE, CAPILLARY: Glucose-Capillary: 232 mg/dL — ABNORMAL HIGH (ref 65–99)

## 2014-12-24 SURGERY — COLONOSCOPY WITH PROPOFOL
Anesthesia: Monitor Anesthesia Care

## 2014-12-24 MED ORDER — LIDOCAINE HCL (CARDIAC) 20 MG/ML IV SOLN
INTRAVENOUS | Status: AC
  Filled 2014-12-24: qty 5

## 2014-12-24 MED ORDER — PROPOFOL 10 MG/ML IV BOLUS
INTRAVENOUS | Status: DC | PRN
  Administered 2014-12-24: 50 mg via INTRAVENOUS
  Administered 2014-12-24 (×3): 25 mg via INTRAVENOUS
  Administered 2014-12-24: 75 mg via INTRAVENOUS

## 2014-12-24 MED ORDER — PROPOFOL 10 MG/ML IV BOLUS
INTRAVENOUS | Status: AC
  Filled 2014-12-24: qty 20

## 2014-12-24 MED ORDER — TORSEMIDE 20 MG PO TABS: ORAL_TABLET | ORAL | Status: DC

## 2014-12-24 MED ORDER — SODIUM CHLORIDE 0.9 % IV SOLN: INTRAVENOUS | Status: DC

## 2014-12-24 MED ORDER — LIDOCAINE HCL (CARDIAC) 20 MG/ML IV SOLN
INTRAVENOUS | Status: DC | PRN
  Administered 2014-12-24: 50 mg via INTRAVENOUS

## 2014-12-24 MED ORDER — LACTATED RINGERS IV SOLN
INTRAVENOUS | Status: DC
  Administered 2014-12-24: 1000 mL via INTRAVENOUS

## 2014-12-24 SURGICAL SUPPLY — 22 items

## 2014-12-24 NOTE — Telephone Encounter (Signed)
Patient left a vm on the refill line requesting an rx for a whole tablet of the torsemide daily as her pharmacy told her it was too soon to fill it. This is due to the fact that her rx was only for one half daily, but per phone note from 12/13/14 it is ok to take the whole tablet when swelling is bad. She is on prednisone and she stated that this makes her swelling worse. Please advise. Thanks, MI

## 2014-12-24 NOTE — Discharge Instructions (Signed)

## 2014-12-24 NOTE — Telephone Encounter (Signed)
F/u       Pt returning Christine's call for lab results.

## 2014-12-24 NOTE — Telephone Encounter (Signed)
NEW  SCRIPT  SENT  TO PHARMACY  WITH  DOSAGE  CHANGE .Nina Howell

## 2014-12-24 NOTE — Telephone Encounter (Signed)
PT AWARE OF LAB RESULTS./CY 

## 2014-12-24 NOTE — Op Note (Signed)
[facility] [place], [address]   COLONOSCOPY PROCEDURE REPORT  PATIENT: Nina, Howell  MR#: [id number] BIRTHDATE: [birth year] , 73  yrs. old GENDER: female ENDOSCOPIST: Carol Ada, MD REFERRED BY: PROCEDURE DATE:  12-Jan-2015 PROCEDURE:   Colonoscopy with snare polypectomy ASA CLASS:   Class III INDICATIONS: Heme positive stool MEDICATIONS: Monitored anesthesia care  DESCRIPTION OF PROCEDURE:   After the risks and benefits and of the procedure were explained, informed consent was obtained.  revealed no abnormalities of the rectum.    The Pentax Adult Colonscope [id number]  endoscope was introduced through the anus and advanced to the cecum, which was identified by both the appendix and ileocecal valve .  The quality of the prep was good. .  The instrument was then slowly withdrawn as the colon was fully examined. Estimated blood loss is zero unless otherwise noted in this procedure report.   COLON FINDINGS: A 3 mm sessile transverse colon polyp was removed with a cold snare.  Scattered diverticula were identified and the majority were in the left side of the colon.     Retroflexed views revealed no abnormalities.     The scope was then withdrawn from the patient and the procedure completed.  WITHDRAWAL TIME: 13 minutes 0 seconds  COMPLICATIONS: There were no immediate complications. ENDOSCOPIC IMPRESSION: 1) Polyp. 2) Diverticula  RECOMMENDATIONS: 1) Follow up biopsy results. 2) Given the current findings, the patient's age, and her significant comorbidities, I do not recommend a follow up colonoscopy.  REPEAT EXAM:  cc:  _______________________________ eSignedCarol Ada, MD 01/12/2015 10:31 AM   CPT CODES: ICD CODES:  The ICD and CPT codes recommended by this software are interpretations from the data that the clinical staff has captured with the software.  The verification of the translation of this report to the ICD  and CPT codes and modifiers is the sole responsibility of the health care institution and practicing physician where this report was generated.  [email or web address] will not be held responsible for the validity of the ICD and CPT codes included on this report.  AMA assumes no liability for data contained or not contained herein. CPT is a Designer, television/film set of the [employer].

## 2014-12-24 NOTE — Anesthesia Postprocedure Evaluation (Signed)
  Anesthesia Post-op Note  Patient: Nina Howell  Procedure(s) Performed: Procedure(s) (LRB): COLONOSCOPY WITH PROPOFOL (N/A)  Patient Location: PACU  Anesthesia Type: MAC  Level of Consciousness: awake and alert   Airway and Oxygen Therapy: Patient Spontanous Breathing  Post-op Pain: mild  Post-op Assessment: Post-op Vital signs reviewed, Patient's Cardiovascular Status Stable, Respiratory Function Stable, Patent Airway and No signs of Nausea or vomiting  Last Vitals:  Filed Vitals:   12/24/14 1028  BP: 105/35  Pulse: 65  Temp: 36.6 C  Resp: 13    Post-op Vital Signs: stable   Complications: No apparent anesthesia complications

## 2014-12-24 NOTE — Transfer of Care (Signed)
Immediate Anesthesia Transfer of Care Note  Patient: Nina Howell  Procedure(s) Performed: Procedure(s): COLONOSCOPY WITH PROPOFOL (N/A)  Patient Location: PACU  Anesthesia Type:MAC  Level of Consciousness: awake, alert  and oriented  Airway & Oxygen Therapy: Patient Spontanous Breathing and Patient connected to face mask oxygen  Post-op Assessment: Report given to RN and Post -op Vital signs reviewed and stable  Post vital signs: Reviewed and stable  Last Vitals:  Filed Vitals:   12/24/14 0842  BP: 135/41  Pulse: 102  Temp: 36.7 C  Resp: 11    Complications: No apparent anesthesia complications

## 2014-12-24 NOTE — Anesthesia Preprocedure Evaluation (Addendum)
Anesthesia Evaluation  Patient identified by MRN, date of birth, ID band Patient awake    Reviewed: Allergy & Precautions, NPO status , Patient's Chart, lab work & pertinent test results  Airway Mallampati: I  TM Distance: >3 FB Neck ROM: Full    Dental   Pulmonary sleep apnea ,    Pulmonary exam normal       Cardiovascular hypertension, +CHF (Echo 0/7622 with systolic EF 63-33%, moderately elevated RV systolic pressures, no significant valve abnormalities) Normal cardiovascular exam    Neuro/Psych Depression    GI/Hepatic   Endo/Other  diabetes, Poorly Controlled, Insulin DependentHypothyroidism Morbid obesity  Renal/GU      Musculoskeletal   Abdominal (+) + obese,   Peds  Hematology   Anesthesia Other Findings NPO appropriate, allergies reviewed Denies active cardiac or pulmonary symptoms, METS < 4 but no signs of acute heart failure, seen by Dr. Johnsie Cancel, cardiologist, uses Cpap regularly No recent congestive cough or symptoms of upper respiratory infection Meds - prednisone for dermatology exacerbation - Glucose 230, likely due to prednisone, IDDM and will choose not to treat for this short, low risk procedure   Reproductive/Obstetrics                         Anesthesia Physical Anesthesia Plan  ASA: III  Anesthesia Plan: MAC   Post-op Pain Management:    Induction: Intravenous  Airway Management Planned: Natural Airway  Additional Equipment:   Intra-op Plan:   Post-operative Plan:   Informed Consent: I have reviewed the patients History and Physical, chart, labs and discussed the procedure including the risks, benefits and alternatives for the proposed anesthesia with the patient or authorized representative who has indicated his/her understanding and acceptance.     Plan Discussed with: CRNA and Anesthesiologist  Anesthesia Plan Comments: (Propofol, ketamine as needed)        Anesthesia Quick Evaluation                                  Anesthesia Evaluation  Patient identified by MRN, date of birth, ID band Patient awake    Reviewed: Allergy & Precautions, H&P , Patient's Chart, lab work & pertinent test results  Airway Mallampati: II      Dental No notable dental hx.    Pulmonary neg pulmonary ROS, sleep apnea ,  clear to auscultation  Pulmonary exam normal       Cardiovascular Exercise Tolerance: Good hypertension, +CHF and neg cardio ROS regular Normal    Neuro/Psych PSYCHIATRIC DISORDERS Depression Negative Neurological ROS  Negative Psych ROS   GI/Hepatic negative GI ROS, Neg liver ROS,   Endo/Other  Negative Endocrine ROSDiabetes mellitus-Hypothyroidism Morbid obesity  Renal/GU negative Renal ROS  Genitourinary negative   Musculoskeletal   Abdominal Normal abdominal exam  (+)   Peds  Hematology negative hematology ROS (+)   Anesthesia Other Findings Cellulitis   ABDOMINAL WALL CHF (congestive heart failure)   DECOMPENSATED SYSTOLIC CHF    Hypoventilation     Hypokalemia        CHF (congestive heart failure)     Diabetes mellitus, type 2        Hypothyroidism     Varicose veins        Morbid obesity    Reproductive/Obstetrics negative OB ROS  Anesthesia Physical Anesthesia Plan  ASA: III  Anesthesia Plan: Spinal   Post-op Pain Management:    Induction:   Airway Management Planned:   Additional Equipment:   Intra-op Plan:   Post-operative Plan:   Informed Consent: I have reviewed the patients History and Physical, chart, labs and discussed the procedure including the risks, benefits and alternatives for the proposed anesthesia with the patient or authorized representative who has indicated his/her understanding and acceptance.     Plan Discussed with: Anesthesiologist, CRNA and Surgeon  Anesthesia Plan Comments: (Iv phenyephrine gtt)         Anesthesia Quick Evaluation

## 2014-12-27 ENCOUNTER — Encounter (HOSPITAL_COMMUNITY): Payer: Self-pay | Admitting: Gastroenterology

## 2014-12-27 ENCOUNTER — Other Ambulatory Visit: Payer: Self-pay

## 2014-12-27 LAB — GLUCOSE, CAPILLARY: GLUCOSE-CAPILLARY: 262 mg/dL — AB (ref 65–99)

## 2014-12-27 NOTE — Patient Outreach (Signed)
[facility]) Care Management  12/27/2014  Nina Howell 11-Aug-1940 [id number]   Telephonic Care Management (Triage Screening) Note  Social:  Lives in her home with husband.  SW x 32 years.   Caregiver: Husband (limited time due to still working at [employer]) Falls? None Transportation:  Still driving but shortness of breath and heat / humidity bothers her breathing making it more difficult.  Husband is not able to get off work to go with patient to appt's.  PCP:  Dr. Glendale Chard  - last appt 12/14/2014 and  Next appt 11/20016 Dr. Johnsie Cancel - last appt  12/21/14 ED Visits? None Admissions: None DME:  Cane, CPap  THN Conditions: HF and DM BS this am 300 but normally around 130.  States she went to sleep yesterday and forgot to take her insulin which is something she never does.  Weight:  344.4  Weighs daily.   Medications : Taking around 10 medications. Taking as prescribed and using mail order.   Insurance:  Patient states she is covered under her husbands plan:  BCBS as primary coverage.  Medicare is secondary.  Patient states she receives calls from a Engineer, site CM.  Patient states this is enough and does not need anything else.   Plan: RN CM notified [place] Assistant to close case:  Out of network for [place] services [employer]).  RN CM will send Dr. Baird Cancer:  Case closure letter   Mariann Laster, RN, BSN, [facility], [place] Management Care Management Coordinator [phone] Direct [phone] Cell [phone] Office [phone] Fax

## 2014-12-28 NOTE — Patient Outreach (Signed)
[facility]) Care Management  12/28/2014  Nina Howell 02-Jun-1940 [id number]   Notification received from Mariann Laster, RN Case Manager to close case due to out of network for [name] services [name] insurance).   Leba Tibbitts L. Quantrell Splitt, [place] Care Management Assistant

## 2015-01-13 ENCOUNTER — Other Ambulatory Visit: Payer: Self-pay | Admitting: *Deleted

## 2015-01-13 ENCOUNTER — Other Ambulatory Visit: Payer: Self-pay | Admitting: Pulmonary Disease

## 2015-01-13 DIAGNOSIS — R0600 Dyspnea, unspecified: Secondary | ICD-10-CM

## 2015-01-13 DIAGNOSIS — Z9989 Dependence on other enabling machines and devices: Principal | ICD-10-CM

## 2015-01-13 DIAGNOSIS — G4733 Obstructive sleep apnea (adult) (pediatric): Secondary | ICD-10-CM

## 2015-01-13 MED ORDER — TORSEMIDE 20 MG PO TABS: ORAL_TABLET | ORAL | Status: DC

## 2015-01-14 DIAGNOSIS — L120 Bullous pemphigoid: Secondary | ICD-10-CM | POA: Diagnosis not present

## 2015-01-14 DIAGNOSIS — Z79899 Other long term (current) drug therapy: Secondary | ICD-10-CM | POA: Diagnosis not present

## 2015-01-17 ENCOUNTER — Encounter: Payer: Self-pay | Admitting: Pulmonary Disease

## 2015-01-17 ENCOUNTER — Ambulatory Visit: Payer: Medicare Other | Admitting: Pulmonary Disease

## 2015-01-17 ENCOUNTER — Ambulatory Visit (INDEPENDENT_AMBULATORY_CARE_PROVIDER_SITE_OTHER): Payer: BLUE CROSS/BLUE SHIELD | Admitting: Pulmonary Disease

## 2015-01-17 VITALS — BP 120/78 | HR 74 | Ht 61.0 in | Wt 343.4 lb

## 2015-01-17 DIAGNOSIS — E662 Morbid (severe) obesity with alveolar hypoventilation: Secondary | ICD-10-CM

## 2015-01-17 NOTE — Addendum Note (Signed)
Addended by: Mathis Dad on: 01/17/2015 04:55 PM   Modules accepted: Orders

## 2015-01-17 NOTE — Patient Instructions (Signed)
CPAP supplies will be renewed x 1 year Weight loss encouraged, compliance with goal of at least 6 hrs every night is the expectation.

## 2015-01-17 NOTE — Assessment & Plan Note (Signed)
CPAP supplies will be renewed x 1 year  Weight loss encouraged, compliance with goal of at least 4-6 hrs every night is the expectation. Advised against medications with sedative side effects Cautioned against driving when sleepy - understanding that sleepiness will vary on a day to day basis

## 2015-01-17 NOTE — Progress Notes (Signed)
   Subjective:    Patient ID: Nina Howell, female    DOB: [birth year], 74 y.o.   MRN: [id number]  HPI  74/F, morbidly obese for FU of obstructive sleep apnea/ OHS .  Adm 03/2010 with worsening dyspnea & hypoxia, VQ neg, BNP 103, diuresed, ABG 7.43/63/89/97% on 2 L, discharged on 2 L & Bilevel 14/8  PSG subsequently showed moderate obstructive sleep apnea with AHi 15/h.  Liver hemangioma was noted on imaging. Echo july'11 >> EF 45-50%septal/ inferoseptal hypokinesis [name])  Download dec-jan'12 >> excellent compliance & control of events on VPAP auto  Lost 100 lbs in 1 yr !  1/13 hysterescopy- dnc and polyp resection performed under spinal anesthesia , uneventful   01/17/2015  Chief Complaint  Patient presents with  . Follow-up    Patient says she is feeling better.  She is currently seeing Dermatologist. OSA: Wearing CPAP machine every night, approx 8 hours nightly.  Will take sd card to get download    Underwent cscopy under propofol sedation Annual FU  pt reports she wears her biPAP everynight >6 hrs a night.  Compliant with BiPAP every night, off O2 , mask ok , pressure ok, no dryness, no nasal symptoms  She was diagnosed with bullous pemphigoid  On prednisone Weight is up some at 340 pounds   Review of Systems neg for any significant sore throat, dysphagia, itching, sneezing, nasal congestion or excess/ purulent secretions, fever, chills, sweats, unintended wt loss, pleuritic or exertional cp, hempoptysis, orthopnea pnd or change in chronic leg swelling. Also denies presyncope, palpitations, heartburn, abdominal pain, nausea, vomiting, diarrhea or change in bowel or urinary habits, dysuria,hematuria, rash, arthralgias, visual complaints, headache, numbness weakness or ataxia.     Objective:   Physical Exam  Gen. Pleasant, obese, in no distress ENT - no lesions, no post nasal drip Neck: No JVD, no thyromegaly, no carotid bruits Lungs: no use of accessory  muscles, no dullness to percussion, decreased without rales or rhonchi  Cardiovascular: Rhythm regular, heart sounds  normal, no murmurs or gallops, no peripheral edema Musculoskeletal: No deformities, no cyanosis or clubbing , no tremors       Assessment & Plan:

## 2015-01-18 ENCOUNTER — Telehealth: Payer: Self-pay | Admitting: Pulmonary Disease

## 2015-01-18 NOTE — Telephone Encounter (Signed)
No residuals Good usage BiPAP working well

## 2015-01-20 NOTE — Telephone Encounter (Signed)
Patient notified.  Nothing further needed. 

## 2015-01-25 DIAGNOSIS — M25511 Pain in right shoulder: Secondary | ICD-10-CM | POA: Diagnosis not present

## 2015-01-25 DIAGNOSIS — M47812 Spondylosis without myelopathy or radiculopathy, cervical region: Secondary | ICD-10-CM | POA: Diagnosis not present

## 2015-01-25 DIAGNOSIS — M542 Cervicalgia: Secondary | ICD-10-CM | POA: Diagnosis not present

## 2015-01-27 ENCOUNTER — Encounter: Payer: Self-pay | Admitting: Pulmonary Disease

## 2015-02-01 ENCOUNTER — Encounter: Payer: Self-pay | Admitting: Cardiovascular Disease

## 2015-02-01 DIAGNOSIS — R42 Dizziness and giddiness: Secondary | ICD-10-CM | POA: Diagnosis not present

## 2015-02-01 DIAGNOSIS — I10 Essential (primary) hypertension: Secondary | ICD-10-CM | POA: Diagnosis not present

## 2015-02-01 DIAGNOSIS — E1165 Type 2 diabetes mellitus with hyperglycemia: Secondary | ICD-10-CM | POA: Diagnosis not present

## 2015-02-01 DIAGNOSIS — R05 Cough: Secondary | ICD-10-CM | POA: Diagnosis not present

## 2015-02-09 DIAGNOSIS — Z0389 Encounter for observation for other suspected diseases and conditions ruled out: Secondary | ICD-10-CM | POA: Diagnosis not present

## 2015-02-09 DIAGNOSIS — H2513 Age-related nuclear cataract, bilateral: Secondary | ICD-10-CM | POA: Diagnosis not present

## 2015-02-09 DIAGNOSIS — E119 Type 2 diabetes mellitus without complications: Secondary | ICD-10-CM | POA: Diagnosis not present

## 2015-02-17 ENCOUNTER — Telehealth: Payer: Self-pay | Admitting: Pulmonary Disease

## 2015-02-17 NOTE — Telephone Encounter (Signed)
Spoke with pt. Made her aware she has only been seen for OSA and needs to contact PCP. She will do so. Nothing further needed

## 2015-02-21 ENCOUNTER — Institutional Professional Consult (permissible substitution): Payer: Medicare Other | Admitting: Internal Medicine

## 2015-02-24 DIAGNOSIS — L821 Other seborrheic keratosis: Secondary | ICD-10-CM | POA: Diagnosis not present

## 2015-02-24 DIAGNOSIS — Z79899 Other long term (current) drug therapy: Secondary | ICD-10-CM | POA: Diagnosis not present

## 2015-02-24 DIAGNOSIS — L120 Bullous pemphigoid: Secondary | ICD-10-CM | POA: Diagnosis not present

## 2015-03-01 DIAGNOSIS — E1142 Type 2 diabetes mellitus with diabetic polyneuropathy: Secondary | ICD-10-CM | POA: Diagnosis not present

## 2015-03-01 DIAGNOSIS — Z5181 Encounter for therapeutic drug level monitoring: Secondary | ICD-10-CM | POA: Diagnosis not present

## 2015-03-01 DIAGNOSIS — E1165 Type 2 diabetes mellitus with hyperglycemia: Secondary | ICD-10-CM | POA: Diagnosis not present

## 2015-03-01 DIAGNOSIS — Z23 Encounter for immunization: Secondary | ICD-10-CM | POA: Diagnosis not present

## 2015-03-01 DIAGNOSIS — E039 Hypothyroidism, unspecified: Secondary | ICD-10-CM | POA: Diagnosis not present

## 2015-03-01 DIAGNOSIS — Z794 Long term (current) use of insulin: Secondary | ICD-10-CM | POA: Diagnosis not present

## 2015-03-01 DIAGNOSIS — R21 Rash and other nonspecific skin eruption: Secondary | ICD-10-CM | POA: Diagnosis not present

## 2015-03-01 DIAGNOSIS — Z6841 Body Mass Index (BMI) 40.0 and over, adult: Secondary | ICD-10-CM | POA: Diagnosis not present

## 2015-04-05 ENCOUNTER — Other Ambulatory Visit: Payer: Self-pay | Admitting: Cardiovascular Disease

## 2015-04-05 DIAGNOSIS — Z79899 Other long term (current) drug therapy: Secondary | ICD-10-CM | POA: Diagnosis not present

## 2015-04-05 DIAGNOSIS — R0600 Dyspnea, unspecified: Secondary | ICD-10-CM

## 2015-04-05 DIAGNOSIS — L120 Bullous pemphigoid: Secondary | ICD-10-CM | POA: Diagnosis not present

## 2015-04-05 NOTE — Telephone Encounter (Signed)
Pt is calling c/o SOB, leg swelling and wheezing. Pt stated that Dr. Johnsie Cancel prescribe her Torsemide 20 mg, taking 1/2 tablet twice a day and pt states that she thinks that it needs to be increased because the swelling is not going down. Please advise

## 2015-04-06 ENCOUNTER — Telehealth: Payer: Self-pay | Admitting: *Deleted

## 2015-04-06 DIAGNOSIS — Z79899 Other long term (current) drug therapy: Secondary | ICD-10-CM

## 2015-04-06 MED ORDER — SPIRONOLACTONE 25 MG PO TABS: 25.0000 mg | ORAL_TABLET | Freq: Every day | ORAL | Status: DC

## 2015-04-06 MED ORDER — TORSEMIDE 20 MG PO TABS: ORAL_TABLET | ORAL | Status: DC

## 2015-04-06 NOTE — Telephone Encounter (Signed)
Nina Howell, [place] Female, 74 y.o., Mar 24, 1941 Last Weight:  343 lb 6.4 oz (155.765 kg) Phone:  [phone] PCP:  Glendale Chard Language:  English Need Interp:  No AllergiesCelebrex [Celecoxib] Atorvastatin Cephalexin Cephalosporins Oxycodone-acetaminophen Penicillins Health Maintenance:  Due FYIGeneral Primary Ins:  [id number] MRN:  [id number] MyChart:  Pending Next Appt:  04/25/2015 Nina Howell - 04/05/2015 2:19 PM >','<< Less Detail',event)" href="javascript:;">More Detail >>   [name]   Sent: Tue April 05, 2015 2:23 PM    To: Richmond Campbell, LPN       Nina Howell  74 y.o. / Female ([birth year])  Pharmacy: [place], [place] Ph: [phone]   MRN: [id number]  PCP: Glendale Chard, MD  Wt: 343 lb 6.4 oz (155.765 kg) (01/17/2015)   Home: [phone]  Work: Not available  Mobile: Not available       Guarantor Account: Euna, Kender ([id number])     Relation to Patient: Account Type Service Area    Self Personal/Family [place] for This Account     Coverage ID Payor Plan Insurance ID    [id number] [place] [id number]    [place] [id number] A        Guarantor Account: Jaretta, Bol ([id number])     Relation to Patient: Account Type Service Area    Self Personal/Family [place] for This Account     Coverage ID Payor Plan Insurance ID    [id number] MEDICARE MEDICARE PART A AND B [id number] A    [id number] MEDICARE MEDICARE PART A AND B [id number]        Guarantor Account: Adi, Tetrault ([id number])     Relation to Patient: Account Type Service Area    Self Personal/Family [place] for This Account     Coverage ID Payor Plan Insurance ID    [id number] MEDICARE MEDICARE PART A  AND B [id number] A    [id number] MEDICARE MEDICARE PART A AND B [id number]        Guarantor Account: Yecica, Bove ([id number])     Relation to Patient: Account Type Service Area    Self Personal/Family [place]          Guarantor Account: Maythe, Goede ([id number])     Relation to Patient: Account Type Service Area    Self Personal/Family [facility]          Guarantor Account: Milada, [place] ([id number])     Relation to Patient: Account Type Service Area    Self Personal/Family [place]          Medication Refill     Josue Hector, MD   Shellia Cleverly, RN  33 minutes ago   (10:20 AM)    Shellia Cleverly, RN   Richmond Campbell, LPN (You) and Josue Hector, MD  2 hours ago   (8:45 AM)    [facility] Triage  20 hours ago   (2:29 PM)    Guido Sander, LPN (You)  20 hours ago   (2:23 PM)    Pt is calling c/o SOB, leg swelling and wheezing. Pt stated that Dr. Johnsie Cancel prescribe her Torsemide 20 mg, taking  1/2 tablet twice a day and pt states that she thinks that it needs to be increased because the swelling is not going down. Please advise  [place], [place] [id number]   [name]  20 hours ago   (2:19 PM)       Call [address] at 04/05/2015 2:21 PM     Status: Signed       Expand All Collapse All   Pt is calling c/o SOB, leg swelling and wheezing. Pt stated that Dr. Johnsie Cancel prescribe her Torsemide 20 mg, taking 1/2 tablet twice a day and pt states that she thinks that it needs to be increased because the swelling is not going down. Please advise             Contacts       Type Contact    04/05/2015 02:19 PM Phone (Incoming) Nina Howell (Self)    Phone: [phone] (H)      Allergies as of 04/05/2015  Review Complete On: 01/17/2015 By: Glean Hess,  CMA    Allergen Noted Reaction Type    Celebrex [Celecoxib] 10/15/2014  Allergy    Reactions: Swelling    Atorvastatin 08/16/2014  Intolerance    Reactions: Other (See Comments)    Cephalexin      REACTION: hives    Cephalosporins      REACTION: rash    Oxycodone-Acetaminophen 03/25/2012      Reactions: Nausea Only    Penicillins 10/15/2014      Reactions: Other (See Comments)    Not known      Patient Flags     Flag Type Author Status [place] Active 11/25/2014 10:58 AM    Dpr on file Janne Napoleon (husband), [name] (daughter)        General Sudie Bailey Active 12/19/2010 3:17 PM    Cent # [id number]          Future Appointments       Provider Department    04/25/2015 8:15 AM Josue Hector, MD [place]    01/18/2016 9:30 AM Tammy Jeralene Huff, NP [facility]      Outstanding Procedures      Open Future Orders       Priority Expected Expires Ordered    [place] ABI W/WO TBI Routine  08/20/2015 08/19/2014         Normal Orders       Priority Ordered    Ambulatory Referral for DME Routine 01/17/2015    Ambulatory Referral for DME Routine 01/13/2015    France[place] Nodal Test Routine 11/25/2014    Ambulatory Referral for DME Routine 06/15/2014          Past Appointments     Date Time Status Provider Department Type    02/21/2015 1:45 PM Can Deneise Lever, MD [facility] ALL[facility]SULT    Appt Notes: pruritus per Dr. Bailey Mech Sanders//kmm    01/17/2015 1:30 PM Comp Rigoberto Noel, MD [facility] OV    Appt Notes: 1 yr rov    01/17/2015 9:00 AM Can Noralee Space, MD [facility] OV30    Appt Notes: 1 yr rov    12/21/2014 7:30 AM Comp Eulis Foster [facility]10    Appt Notes: BMET/al    12/01/2014 9:30 AM Comp Gean Birchwood, DPM [facility] DIASHOES    Appt Notes: PUDS/DR T  11/25/2014  11:00 AM Comp [facility]10    11/25/2014 10:00 AM Comp Deneise Lever, MD [facility]    Appt Notes: Blisters, allergies/ mg    11/05/2014 10:00 AM Comp Melody A Parry [facility] CASTINGSCAN    Appt Notes: DIABETIC SHOE MEASUREMENTS    10/27/2014 7:30 AM Comp [facility] CH ECHO 2 MC-SITE3ECHO 2D ECHO    Appt Notes: 10/20/2014 [id number] EXPIRES 06/30/2016DAJ  Echo/Dyspnea/Nishan sp confirmed appt/fc  10/26/14 Pt called to conf appt-TR    10/19/2014 10:00 AM Comp Josue Hector, MD [facility] [id number]    Appt Notes: 05/27 @ 10:22a called and LVM to confirm//srRECORDS ON FILE .Marland KitchenRef Dr. Buddy Duty at [facility], rep Anderson [place] [id number] EKG..Will fax notes//BCBS//(Advised if notes are not on file appt will cancel)//SR **Prep EDH      Pharmacy     [place] [place] [facility]    [address] [place] [address]    Phone: [phone] Fax: [phone]    Open 24 Hours?: No

## 2015-04-06 NOTE — Telephone Encounter (Signed)
SPOKE WITH  PT  RE MESSAGE PER  PT  HAS NOTED  EDEMA  TO LEGS  ALSO C/O SOB  AND  WEIGHT  GAIN CLAIMS  10 LBS   HAS  BEEN TAKING  TORSEMIDE 20 MG  1/2  TAB  BID  ROUTINELY  WITH  NO   RELIEF  DISCUSSED WITH DR  Johnsie Cancel  START  ALDACTONE  25 MG   1  TAB MID DAY   AND  CHECK  BMET IN  2 WEEKS  PT  NOTIFIED   HAS  APPT  04-25-15 WILL CHECK BMET  AT THAT  TIME .Adonis Housekeeper

## 2015-04-11 DIAGNOSIS — R300 Dysuria: Secondary | ICD-10-CM | POA: Diagnosis not present

## 2015-04-11 DIAGNOSIS — J189 Pneumonia, unspecified organism: Secondary | ICD-10-CM | POA: Diagnosis not present

## 2015-04-11 DIAGNOSIS — R0602 Shortness of breath: Secondary | ICD-10-CM | POA: Diagnosis not present

## 2015-04-21 ENCOUNTER — Encounter: Payer: Self-pay | Admitting: *Deleted

## 2015-04-21 ENCOUNTER — Ambulatory Visit: Payer: Medicare Other | Admitting: Cardiovascular Disease

## 2015-04-21 NOTE — Progress Notes (Signed)
Patient ID: Nina Howell, female   DOB: 13-Jan-1941, 74 y.o.   MRN: [id number]     Cardiology Office Note   Date:  04/25/2015   ID:  Nina Howell, DOB 08/23/1940, MRN [id number]  PCP:  Maximino [place], MD  Cardiologist:   Jenkins Rouge, MD   Chief Complaint  Patient presents with  . Follow-up    sob      History of Present Illness: Nina Howell is a 74 y.o. female who presents for evaluation of abnormal ECG.  Sees Dr Elsworth Soho for morbid obesity and OSA on CPAP Previously seen by Dr [name].  Echo 2011 done for dyspnea showed estimated PA pressure 56 mmHg and EF 45-50%  CRF DM and HTN Cath 11/28/09 by [name] normal coronary arteries  She was told by Dr [name] she needed nuclear study and she got worried when he was  [place] of [place] and couldn't schedule it. Not clear of indication for nuclear study  She is not having chest pain.  Activity limited by obesity and knee Pain.  Chronic dyspnea from OSA had been going to rehab and sees Dr Elsworth Soho regularly   Aldactone started for increased LE edema 03/2015    10/27/14  Echo reviewed:  Study Conclusions  - Left ventricle: The cavity size was normal. Systolic function was normal. The estimated ejection fraction was in the range of 50% to 55%. Wall motion was normal; there were no regional wall motion abnormalities. Doppler parameters are consistent with abnormal left ventricular relaxation (grade 1 diastolic dysfunction). - Aortic valve: Trileaflet; normal thickness leaflets. There was no regurgitation. - Aortic root: The aortic root was normal in size. - Mitral valve: Structurally normal valve. There was no regurgitation. - Right ventricle: Systolic function was normal. - Right atrium: The atrium was normal in size. - Tricuspid valve: There was mild regurgitation. - Pulmonary arteries: Systolic pressure was moderately increased. PA peak pressure: 50 mm Hg (S). - Inferior vena cava: The vessel was normal in  size. - Pericardium, extracardiac: There was no pericardial effusion.  Has pemphigoid and seeing Laurel Dimmer  Was on prednisone but now celcept   Past Medical History  Diagnosis Date  . Cellulitis     ABDOMINAL WALL  . CHF (congestive heart failure) (HCC)     DECOMPENSATED SYSTOLIC CHF  . Hypoventilation   . Hypokalemia   . CHF (congestive heart failure) ([place])   . Diabetes mellitus, type 2 ([place])   . Hypothyroidism   . Varicose veins   . Morbid obesity ([place])   . IUD (intrauterine device) in place   . DJD (degenerative joint disease)   . Hypertension   . Anemia   . Hepatic lesion   . Asymptomatic cholelithiasis   . Hyperlipidemia   . CTS (carpal tunnel syndrome)   . 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 Acute pancreatitis   . Sleep apnea     cpap  . Rash and nonspecific skin eruption     all over- 12-17-14 "drying in _ Auto immune problem"-seeing dermalogist    Past Surgical History  Procedure Laterality Date  . Shoulder surgery  2009    RIGHT SHOULDER  . Cardiac catheterization  2011    pt states "clean report"  . Tubal ligation    . Dilation and curettage of uterus    . Colonoscopy with propofol N/A 12/24/2014    Procedure: COLONOSCOPY WITH PROPOFOL;  Surgeon: Carol Ada, MD;  Location: [facility];  Service: Endoscopy;  Laterality: N/A;  Current Outpatient Prescriptions  Medication Sig Dispense Refill  . aspirin 81 MG tablet Take 81 mg by mouth daily.    Marland Kitchen atorvastatin (LIPITOR) 20 MG tablet Take 20 mg by mouth every other day.    . Cholecalciferol (VITAMIN D3) 1000 UNITS CAPS Take 1 tablet by mouth daily.      Marland Kitchen CRANBERRY PO Take 4 tablets by mouth daily.     . Cyanocobalamin (VITAMIN B-12) 2500 MCG SUBL Place 1 tablet under the tongue daily.    . fluocinonide (LIDEX) 0.05 % external solution Apply 1 application topically 2 (two) times daily.    . insulin NPH-regular Human (NOVOLIN 70/30) (70-30) 100 UNIT/ML injection Inject 25-30 Units  into the skin 2 (two) times daily with a meal.     . levothyroxine (SYNTHROID, LEVOTHROID) 50 MCG tablet Take 50 mcg by mouth daily.      . Liraglutide (VICTOZA) 18 MG/3ML SOPN Inject 1.8 mg into the skin daily.    . metFORMIN (GLUCOPHAGE) 1000 MG tablet Take 1,000 mg by mouth 2 (two) times daily with a meal.    . Multiple Vitamin (MULTIVITAMIN WITH MINERALS) TABS tablet Take 0.5 tablets by mouth daily.    . potassium chloride SA (K-DUR,KLOR-CON) 20 MEQ tablet Take 20 mEq by mouth daily.     . sertraline (ZOLOFT) 50 MG tablet Take 50 mg by mouth at bedtime.     Marland Kitchen spironolactone (ALDACTONE) 25 MG tablet Take 1 tablet (25 mg total) by mouth daily. 90 tablet 3  . torsemide (DEMADEX) 20 MG tablet Take 10 mg by mouth 2 (two) times daily.      No current facility-administered medications for this visit.    Allergies:   Celebrex; Atorvastatin; Cephalexin; Cephalosporins; Oxycodone-acetaminophen; Penicillins; and Oxycodone    Social History:  The patient  reports that she has never smoked. She has never used smokeless tobacco. She reports that she does not drink alcohol or use illicit drugs.   Family History:  The patient's family history includes Cancer in her mother; Diabetes in her father; Leukemia in her father.    ROS:  Please see the history of present illness.   Otherwise, review of systems are positive for none.   All other systems are reviewed and negative.    PHYSICAL EXAM: VS:  BP 130/68 mmHg  Pulse 73  Ht 5\' 1"  (1.549 m)  Wt 160.12 kg (353 lb)  BMI 66.73 kg/m2 , BMI Body mass index is 66.73 kg/(m^2). Morbidly obese female  Healthy:  appears stated age 44: normal Neck supple with no adenopathy JVP normal no bruits no thyromegaly Lungs clear with no wheezing and good diaphragmatic motion Heart:  S1/S2 no murmur, no rub, gallop or click PMI normal Abdomen: benighn, BS positve, no tenderness, no AAA no bruit.  No HSM or HJR Distal pulses intact with no bruits No  edema Neuro non-focal Diffuse healing bullous rash  No muscular weakness    EKG:  06/19/11  SR rate 60  Diffuse biphasic T wave changes    Recent Labs: 11/25/2014: Hemoglobin 13.3; Platelets 361.0 12/21/2014: BUN 14; Creatinine, Ser 0.66; Potassium 3.5; Sodium 139    Lipid Panel No results found for: CHOL, TRIG, HDL, CHOLHDL, VLDL, LDLCALC, LDLDIRECT    Wt Readings from Last 3 Encounters:  04/25/15 160.12 kg (353 lb)  01/17/15 155.765 kg (343 lb 6.4 oz)  12/24/14 153.316 kg (338 lb)      Other studies Reviewed: Additional studies/ records that were reviewed today include:  Office  notes Dr Louanna Raw and Dr [name] .    ASSESSMENT AND PLAN:  1.  Abnormal ECG:  Chronic had biphasic T;s during cath in 2011 and had normal coronaries.  No need for nuclear testing at this time no symptoms Ok to have colonoscopy with Dr Benson Norway if needed 2. Low EF:  Echo June 16  EF 50-55% improved   3. DM: Discussed low carb diet.  Target hemoglobin A1c is 6.5 or less.  Continue current medications. 4. OSA:  F/u Dr Elsworth Soho major issue Compliant with CPAP last 3 years 5. Chol:  On statin labs with primary 6 Bipolar disease seems a bit on manic side with flight of ideas continue current meds 7. Pemphigoid:  Improved but does not like celcept.  F/u derm    Current medicines are reviewed at length with the patient today.  The patient does not have concerns regarding medicines.  The following changes have been made:  no change  Labs/ tests ordered today include:   No orders of the defined types were placed in this encounter.     Disposition:   FU with me in 6 months      Signed, Jenkins Rouge, MD  04/25/2015 8:22 AM    [facility] [place], [place], [place]  [address] Phone: [phone]; Fax: [phone]

## 2015-04-25 ENCOUNTER — Ambulatory Visit (INDEPENDENT_AMBULATORY_CARE_PROVIDER_SITE_OTHER): Payer: BLUE CROSS/BLUE SHIELD | Admitting: Cardiovascular Disease

## 2015-04-25 ENCOUNTER — Encounter: Payer: Self-pay | Admitting: Cardiovascular Disease

## 2015-04-25 VITALS — BP 130/68 | HR 73 | Ht 61.0 in | Wt 353.0 lb

## 2015-04-25 DIAGNOSIS — Z79899 Other long term (current) drug therapy: Secondary | ICD-10-CM | POA: Diagnosis not present

## 2015-04-25 DIAGNOSIS — R0600 Dyspnea, unspecified: Secondary | ICD-10-CM | POA: Diagnosis not present

## 2015-04-25 NOTE — Patient Instructions (Addendum)
Medication Instructions:  Your physician recommends that you continue on your current medications as directed. Please refer to the Current Medication list given to you today.  Labwork: NONE  Testing/Procedures: Your physician has requested that you have an echocardiogram in 1 year. Echocardiography is a painless test that uses sound waves to create images of your heart. It provides your doctor with information about the size and shape of your heart and how well your heart's chambers and valves are working. This procedure takes approximately one hour. There are no restrictions for this procedure.    Follow-Up: Your physician wants you to follow-up in: 1 year with Dr. Johnsie Cancel. You will receive a reminder letter in the mail two months in advance. If you don't receive a letter, please call our office to schedule the follow-up appointment.   If you need a refill on your cardiac medications before your next appointment, please call your pharmacy.

## 2015-04-26 DIAGNOSIS — E1142 Type 2 diabetes mellitus with diabetic polyneuropathy: Secondary | ICD-10-CM | POA: Diagnosis not present

## 2015-04-26 DIAGNOSIS — Z794 Long term (current) use of insulin: Secondary | ICD-10-CM | POA: Diagnosis not present

## 2015-04-26 DIAGNOSIS — E039 Hypothyroidism, unspecified: Secondary | ICD-10-CM | POA: Diagnosis not present

## 2015-04-26 DIAGNOSIS — E1165 Type 2 diabetes mellitus with hyperglycemia: Secondary | ICD-10-CM | POA: Diagnosis not present

## 2015-04-26 DIAGNOSIS — Z6841 Body Mass Index (BMI) 40.0 and over, adult: Secondary | ICD-10-CM | POA: Diagnosis not present

## 2015-05-09 ENCOUNTER — Telehealth: Payer: Self-pay | Admitting: Pulmonary Disease

## 2015-05-09 NOTE — Telephone Encounter (Signed)
Received forms, placed in RA's box for when he returns.

## 2015-05-12 ENCOUNTER — Other Ambulatory Visit: Payer: Self-pay | Admitting: Obstetrics and Gynecology

## 2015-05-12 DIAGNOSIS — N840 Polyp of corpus uteri: Secondary | ICD-10-CM | POA: Diagnosis not present

## 2015-05-12 DIAGNOSIS — N8502 Endometrial intraepithelial neoplasia [EIN]: Secondary | ICD-10-CM | POA: Diagnosis not present

## 2015-05-13 NOTE — Telephone Encounter (Signed)
Forms were filled out by Dr. Annamaria Boots in Dr. Bari Mantis absence. Forms have been faxed back to [employer]. Patient notified that forms have been faxed to [employer]. Nothing further needed.

## 2015-05-26 DIAGNOSIS — J209 Acute bronchitis, unspecified: Secondary | ICD-10-CM | POA: Diagnosis not present

## 2015-05-31 ENCOUNTER — Other Ambulatory Visit: Payer: Self-pay | Admitting: *Deleted

## 2015-05-31 ENCOUNTER — Telehealth: Payer: Self-pay | Admitting: *Deleted

## 2015-05-31 MED ORDER — TORSEMIDE 20 MG PO TABS: 10.0000 mg | ORAL_TABLET | Freq: Two times a day (BID) | ORAL | Status: DC

## 2015-05-31 MED ORDER — SPIRONOLACTONE 25 MG PO TABS: 25.0000 mg | ORAL_TABLET | Freq: Every day | ORAL | Status: DC

## 2015-05-31 NOTE — Telephone Encounter (Signed)
Called patient, she is seeing her PCP this week and will have lab work at that time. Encouraged patient to have results sent to our office.

## 2015-05-31 NOTE — Telephone Encounter (Signed)
Patient left a voicemail on the refill line requesting refills on spironolactone and torsemide be [facility]. I have sent these in, but per phone note 04/06/15, patient was to have a bmet two weeks after starting the spironolactone. It does not look like she had this done.

## 2015-06-02 DIAGNOSIS — E785 Hyperlipidemia, unspecified: Secondary | ICD-10-CM | POA: Diagnosis not present

## 2015-06-02 DIAGNOSIS — E1140 Type 2 diabetes mellitus with diabetic neuropathy, unspecified: Secondary | ICD-10-CM | POA: Diagnosis not present

## 2015-06-02 DIAGNOSIS — Z794 Long term (current) use of insulin: Secondary | ICD-10-CM | POA: Diagnosis not present

## 2015-06-02 DIAGNOSIS — Z79899 Other long term (current) drug therapy: Secondary | ICD-10-CM | POA: Diagnosis not present

## 2015-06-02 DIAGNOSIS — I509 Heart failure, unspecified: Secondary | ICD-10-CM | POA: Diagnosis not present

## 2015-06-02 DIAGNOSIS — E1165 Type 2 diabetes mellitus with hyperglycemia: Secondary | ICD-10-CM | POA: Diagnosis not present

## 2015-06-02 DIAGNOSIS — R05 Cough: Secondary | ICD-10-CM | POA: Diagnosis not present

## 2015-06-02 DIAGNOSIS — E039 Hypothyroidism, unspecified: Secondary | ICD-10-CM | POA: Diagnosis not present

## 2015-06-02 DIAGNOSIS — F320 Major depressive disorder, single episode, mild: Secondary | ICD-10-CM | POA: Diagnosis not present

## 2015-06-02 DIAGNOSIS — I10 Essential (primary) hypertension: Secondary | ICD-10-CM | POA: Diagnosis not present

## 2015-06-09 ENCOUNTER — Telehealth: Payer: Self-pay | Admitting: Cardiovascular Disease

## 2015-06-09 NOTE — Telephone Encounter (Signed)
Pt's doctor at [facility] [place].

## 2015-06-09 NOTE — Telephone Encounter (Signed)
New message     Calling to see if Dr Johnsie Cancel got a copy of patient's recent labs drawn by her PCP at [facility]?

## 2015-06-09 NOTE — Telephone Encounter (Signed)
Pt states she had lab done 06/02/15 at [facility] PCP and a copy was to be sent to Dr Johnsie Cancel.   Pt advised I cannot find a copy of lab results, I will call [facility] to get a copy of lab results.

## 2015-06-10 NOTE — Telephone Encounter (Signed)
Tried to call Dr. Corinne Ports office at [facility] [place] [phone]; received this message every time number is dialed, "we are sorry your call cannot be completed as dialed". Patient stated she would call their office also. No fax received from their office at this time.

## 2015-06-13 NOTE — Telephone Encounter (Signed)
Follow up     Calling to let you know that [facility] phone was not working last Friday.  Please call them again to get a copy of the most recent labs.

## 2015-06-15 NOTE — Telephone Encounter (Signed)
Called patient back to let her know our office received her lab results from Dr. Corinne Ports office at [place]. Patient verbalized understanding.

## 2015-06-29 DIAGNOSIS — E1142 Type 2 diabetes mellitus with diabetic polyneuropathy: Secondary | ICD-10-CM | POA: Diagnosis not present

## 2015-06-29 DIAGNOSIS — Z5181 Encounter for therapeutic drug level monitoring: Secondary | ICD-10-CM | POA: Diagnosis not present

## 2015-06-29 DIAGNOSIS — Z6841 Body Mass Index (BMI) 40.0 and over, adult: Secondary | ICD-10-CM | POA: Diagnosis not present

## 2015-06-29 DIAGNOSIS — E039 Hypothyroidism, unspecified: Secondary | ICD-10-CM | POA: Diagnosis not present

## 2015-06-29 DIAGNOSIS — Z79899 Other long term (current) drug therapy: Secondary | ICD-10-CM | POA: Diagnosis not present

## 2015-06-29 DIAGNOSIS — E1165 Type 2 diabetes mellitus with hyperglycemia: Secondary | ICD-10-CM | POA: Diagnosis not present

## 2015-06-29 DIAGNOSIS — Z794 Long term (current) use of insulin: Secondary | ICD-10-CM | POA: Diagnosis not present

## 2015-07-06 DIAGNOSIS — L821 Other seborrheic keratosis: Secondary | ICD-10-CM | POA: Diagnosis not present

## 2015-07-06 DIAGNOSIS — L120 Bullous pemphigoid: Secondary | ICD-10-CM | POA: Diagnosis not present

## 2015-07-13 ENCOUNTER — Telehealth: Payer: Self-pay | Admitting: Pulmonary Disease

## 2015-07-13 NOTE — Telephone Encounter (Signed)
Form was found under media from date 04/21/15. I have re faxed form to [employer]. Pt aware Nothing further needed

## 2015-07-13 NOTE — Telephone Encounter (Signed)
I am not seeing any forms on pt in RA/CY look at. I do not see anything scanned in patient chart either. Called spoke with pt. She reports [employer] called and told her they sent these back to [place] in December. She is going to call [employer] and have them to resend her the form personally. She reports once she gets this then she will drop off at our office. Nothing further needed

## 2015-08-01 DIAGNOSIS — R05 Cough: Secondary | ICD-10-CM | POA: Diagnosis not present

## 2015-08-01 DIAGNOSIS — E1142 Type 2 diabetes mellitus with diabetic polyneuropathy: Secondary | ICD-10-CM | POA: Diagnosis not present

## 2015-08-01 DIAGNOSIS — I10 Essential (primary) hypertension: Secondary | ICD-10-CM | POA: Diagnosis not present

## 2015-08-01 DIAGNOSIS — Z794 Long term (current) use of insulin: Secondary | ICD-10-CM | POA: Diagnosis not present

## 2015-08-01 DIAGNOSIS — F320 Major depressive disorder, single episode, mild: Secondary | ICD-10-CM | POA: Diagnosis not present

## 2015-08-01 DIAGNOSIS — L120 Bullous pemphigoid: Secondary | ICD-10-CM | POA: Diagnosis not present

## 2015-08-01 DIAGNOSIS — E662 Morbid (severe) obesity with alveolar hypoventilation: Secondary | ICD-10-CM | POA: Diagnosis not present

## 2015-08-01 DIAGNOSIS — I509 Heart failure, unspecified: Secondary | ICD-10-CM | POA: Diagnosis not present

## 2015-08-17 DIAGNOSIS — R0602 Shortness of breath: Secondary | ICD-10-CM | POA: Diagnosis not present

## 2015-08-18 ENCOUNTER — Telehealth: Payer: Self-pay | Admitting: Pulmonary Disease

## 2015-08-18 NOTE — Telephone Encounter (Signed)
Telephone encounter has been closed, had to Addend call. ------------------------------------------------------------ Called patient to schedule appointment with TP at her 1st available per Dr. Bari Mantis request. Left message for patient to call back to schedule appointment.

## 2015-08-18 NOTE — Telephone Encounter (Signed)
Called spoke with pt. She was calling as FYI to inform us she had to call EMS yesterday and was given a breathing treatment. She is feeling much better today. FYI to Dr. Elsworth Soho.

## 2015-08-18 NOTE — Telephone Encounter (Signed)
Please offer her appointment with TP next available

## 2015-08-18 NOTE — Telephone Encounter (Signed)
No message needederror.Nina Howell

## 2015-08-18 NOTE — Telephone Encounter (Signed)
See previous telephone note - 08/18/15 Will close this message

## 2015-08-18 NOTE — Telephone Encounter (Signed)
Pt states that she cannot breathe and cannot do any exertional activities.  Pt states that she cannot come in to be seen. Pt states that she had to call EMS 08/17/15 d/t increased breathing issues.  Pt states that her breathing is severe today and she is coughing up brown mucus. Pt very upset and states that she just wants someone to listen to her and help her.  I advised the patient that we are trying to help her and that she needs to be seen by someone and if she is unable to make it into the office to be seen by Dr Alva/ NPs then she needs to be seen by someone (ED). Pt states that she fears going to the hospital because of all of her medical issues and the fact that there are so many sick people there - feels that she is not going to get any better going in there. I advised that patient that at this point and how she is feeling and sounding while talking to her that the ED is for the best. Pt aware that she can be treated and given neb treatments there and monitored constantly - more than what we would be able to do in the office at this point. Pt states that she will call EMS if she feels that she is getting worse - states that she is home alone and her husband wont be home until after 63 and she is unable to go down and unlock the door for them. Advised patient that of she feels more comfortable she can wait until he gets home to call them or he can take her to the hospital when he gets home>>either way she is needs to go and be evaluated. Pt expressed understanding and states that she will call.   Will send to RA as FYI.

## 2015-08-19 ENCOUNTER — Inpatient Hospital Stay (HOSPITAL_COMMUNITY)
Admission: EM | Admit: 2015-08-19 | Discharge: 2015-08-27 | DRG: 291 | Disposition: A | Discharge status: Home Health | Payer: BLUE CROSS/BLUE SHIELD | Attending: Internal Medicine | Admitting: Internal Medicine

## 2015-08-19 ENCOUNTER — Encounter (HOSPITAL_COMMUNITY): Payer: Self-pay | Admitting: Emergency Medicine

## 2015-08-19 ENCOUNTER — Emergency Department (HOSPITAL_COMMUNITY): Payer: BLUE CROSS/BLUE SHIELD

## 2015-08-19 ENCOUNTER — Telehealth: Payer: Self-pay | Admitting: Pulmonary Disease

## 2015-08-19 DIAGNOSIS — J441 Chronic obstructive pulmonary disease with (acute) exacerbation: Secondary | ICD-10-CM | POA: Diagnosis present

## 2015-08-19 DIAGNOSIS — D1800 Hemangioma unspecified site: Secondary | ICD-10-CM | POA: Diagnosis present

## 2015-08-19 DIAGNOSIS — R0602 Shortness of breath: Secondary | ICD-10-CM

## 2015-08-19 DIAGNOSIS — I2781 Cor pulmonale (chronic): Secondary | ICD-10-CM | POA: Diagnosis present

## 2015-08-19 DIAGNOSIS — Z794 Long term (current) use of insulin: Secondary | ICD-10-CM

## 2015-08-19 DIAGNOSIS — I50810 Right heart failure, unspecified: Secondary | ICD-10-CM

## 2015-08-19 DIAGNOSIS — J9602 Acute respiratory failure with hypercapnia: Secondary | ICD-10-CM | POA: Diagnosis not present

## 2015-08-19 DIAGNOSIS — Z880 Allergy status to penicillin: Secondary | ICD-10-CM | POA: Diagnosis not present

## 2015-08-19 DIAGNOSIS — Z833 Family history of diabetes mellitus: Secondary | ICD-10-CM | POA: Diagnosis not present

## 2015-08-19 DIAGNOSIS — Z6841 Body Mass Index (BMI) 40.0 and over, adult: Secondary | ICD-10-CM | POA: Diagnosis not present

## 2015-08-19 DIAGNOSIS — E662 Morbid (severe) obesity with alveolar hypoventilation: Secondary | ICD-10-CM

## 2015-08-19 DIAGNOSIS — Z7982 Long term (current) use of aspirin: Secondary | ICD-10-CM

## 2015-08-19 DIAGNOSIS — I110 Hypertensive heart disease with heart failure: Secondary | ICD-10-CM | POA: Diagnosis present

## 2015-08-19 DIAGNOSIS — I5082 Biventricular heart failure: Secondary | ICD-10-CM

## 2015-08-19 DIAGNOSIS — J449 Chronic obstructive pulmonary disease, unspecified: Secondary | ICD-10-CM | POA: Diagnosis not present

## 2015-08-19 DIAGNOSIS — E039 Hypothyroidism, unspecified: Secondary | ICD-10-CM | POA: Diagnosis present

## 2015-08-19 DIAGNOSIS — I272 Other secondary pulmonary hypertension: Secondary | ICD-10-CM | POA: Diagnosis not present

## 2015-08-19 DIAGNOSIS — G4733 Obstructive sleep apnea (adult) (pediatric): Secondary | ICD-10-CM | POA: Diagnosis not present

## 2015-08-19 DIAGNOSIS — Z885 Allergy status to narcotic agent status: Secondary | ICD-10-CM

## 2015-08-19 DIAGNOSIS — E1165 Type 2 diabetes mellitus with hyperglycemia: Secondary | ICD-10-CM | POA: Diagnosis present

## 2015-08-19 DIAGNOSIS — K761 Chronic passive congestion of liver: Secondary | ICD-10-CM | POA: Diagnosis present

## 2015-08-19 DIAGNOSIS — I4891 Unspecified atrial fibrillation: Secondary | ICD-10-CM | POA: Diagnosis not present

## 2015-08-19 DIAGNOSIS — Z886 Allergy status to analgesic agent status: Secondary | ICD-10-CM | POA: Diagnosis not present

## 2015-08-19 DIAGNOSIS — I10 Essential (primary) hypertension: Secondary | ICD-10-CM | POA: Diagnosis not present

## 2015-08-19 DIAGNOSIS — I509 Heart failure, unspecified: Secondary | ICD-10-CM | POA: Diagnosis not present

## 2015-08-19 DIAGNOSIS — Z888 Allergy status to other drugs, medicaments and biological substances status: Secondary | ICD-10-CM

## 2015-08-19 DIAGNOSIS — E11649 Type 2 diabetes mellitus with hypoglycemia without coma: Secondary | ICD-10-CM | POA: Diagnosis not present

## 2015-08-19 DIAGNOSIS — I5043 Acute on chronic combined systolic (congestive) and diastolic (congestive) heart failure: Secondary | ICD-10-CM | POA: Diagnosis present

## 2015-08-19 DIAGNOSIS — R0600 Dyspnea, unspecified: Secondary | ICD-10-CM | POA: Diagnosis not present

## 2015-08-19 DIAGNOSIS — K5900 Constipation, unspecified: Secondary | ICD-10-CM | POA: Diagnosis not present

## 2015-08-19 DIAGNOSIS — R0682 Tachypnea, not elsewhere classified: Secondary | ICD-10-CM | POA: Diagnosis not present

## 2015-08-19 DIAGNOSIS — E785 Hyperlipidemia, unspecified: Secondary | ICD-10-CM | POA: Diagnosis present

## 2015-08-19 DIAGNOSIS — F319 Bipolar disorder, unspecified: Secondary | ICD-10-CM | POA: Diagnosis present

## 2015-08-19 DIAGNOSIS — R609 Edema, unspecified: Secondary | ICD-10-CM

## 2015-08-19 DIAGNOSIS — I5033 Acute on chronic diastolic (congestive) heart failure: Secondary | ICD-10-CM | POA: Diagnosis not present

## 2015-08-19 DIAGNOSIS — E876 Hypokalemia: Secondary | ICD-10-CM | POA: Diagnosis not present

## 2015-08-19 DIAGNOSIS — J9600 Acute respiratory failure, unspecified whether with hypoxia or hypercapnia: Secondary | ICD-10-CM | POA: Diagnosis present

## 2015-08-19 DIAGNOSIS — I2699 Other pulmonary embolism without acute cor pulmonale: Secondary | ICD-10-CM | POA: Diagnosis not present

## 2015-08-19 DIAGNOSIS — J9601 Acute respiratory failure with hypoxia: Secondary | ICD-10-CM | POA: Diagnosis not present

## 2015-08-19 DIAGNOSIS — I319 Disease of pericardium, unspecified: Secondary | ICD-10-CM | POA: Diagnosis not present

## 2015-08-19 LAB — TROPONIN I
TROPONIN I: 0.07 ng/mL — AB (ref <0.031)
Troponin I: 0.06 ng/mL — ABNORMAL HIGH (ref <0.031)
Troponin I: 0.04 ng/mL — ABNORMAL HIGH (ref <0.031)

## 2015-08-19 LAB — COMPREHENSIVE METABOLIC PANEL
ALBUMIN: 2.9 g/dL — AB (ref 3.5–5.0)
ALT: 37 U/L (ref 14–54)
AST: 26 U/L (ref 15–41)
Alkaline Phosphatase: 120 U/L (ref 38–126)
Anion gap: 10 (ref 5–15)
BILIRUBIN TOTAL: 0.7 mg/dL (ref 0.3–1.2)
BUN: 20 mg/dL (ref 6–20)
CHLORIDE: 96 mmol/L — AB (ref 101–111)
CO2: 26 mmol/L (ref 22–32)
Calcium: 9.1 mg/dL (ref 8.9–10.3)
Creatinine, Ser: 0.8 mg/dL (ref 0.44–1.00)
GFR calc Af Amer: >60 mL/min (ref >60)
GFR calc non Af Amer: >60 mL/min (ref >60)
GLUCOSE: 168 mg/dL — AB (ref 65–99)
POTASSIUM: 4.8 mmol/L (ref 3.5–5.1)
Sodium: 132 mmol/L — ABNORMAL LOW (ref 135–145)
TOTAL PROTEIN: 7.5 g/dL (ref 6.5–8.1)

## 2015-08-19 LAB — CBC WITH DIFFERENTIAL/PLATELET
Basophils Absolute: 0 10*3/uL (ref 0.0–0.1)
Basophils Absolute: 0 10*3/uL (ref 0.0–0.1)
Basophils Relative: 0 %
Basophils Relative: 0 %
EOS PCT: 1 %
EOS PCT: 0 %
Eosinophils Absolute: 0.1 10*3/uL (ref 0.0–0.7)
Eosinophils Absolute: 0 10*3/uL (ref 0.0–0.7)
HCT: 37.7 % (ref 36.0–46.0)
HCT: 37.2 % (ref 36.0–46.0)
Hemoglobin: 12 g/dL (ref 12.0–15.0)
Hemoglobin: 12 g/dL (ref 12.0–15.0)
LYMPHS ABS: 1.7 10*3/uL (ref 0.7–4.0)
LYMPHS ABS: 0.6 10*3/uL — AB (ref 0.7–4.0)
LYMPHS PCT: 12 %
LYMPHS PCT: 5 %
MCH: 27.4 pg (ref 26.0–34.0)
MCH: 28.2 pg (ref 26.0–34.0)
MCHC: 31.8 g/dL (ref 30.0–36.0)
MCHC: 32.3 g/dL (ref 30.0–36.0)
MCV: 86.1 fL (ref 78.0–100.0)
MCV: 87.3 fL (ref 78.0–100.0)
MONO ABS: 1.1 10*3/uL — AB (ref 0.1–1.0)
MONO ABS: 0.1 10*3/uL (ref 0.1–1.0)
MONOS PCT: 8 %
Monocytes Relative: 1 %
Neutro Abs: 10.8 10*3/uL — ABNORMAL HIGH (ref 1.7–7.7)
Neutro Abs: 12 10*3/uL — ABNORMAL HIGH (ref 1.7–7.7)
Neutrophils Relative %: 79 %
Neutrophils Relative %: 94 %
PLATELETS: 396 10*3/uL (ref 150–400)
PLATELETS: 390 10*3/uL (ref 150–400)
RBC: 4.38 MIL/uL (ref 3.87–5.11)
RBC: 4.26 MIL/uL (ref 3.87–5.11)
RDW: 16.6 % — ABNORMAL HIGH (ref 11.5–15.5)
RDW: 16.7 % — AB (ref 11.5–15.5)
WBC: 13.6 10*3/uL — AB (ref 4.0–10.5)
WBC: 12.8 10*3/uL — ABNORMAL HIGH (ref 4.0–10.5)

## 2015-08-19 LAB — TSH: TSH: 2.056 u[IU]/mL (ref 0.350–4.500)

## 2015-08-19 LAB — MRSA PCR SCREENING: MRSA BY PCR: NEGATIVE

## 2015-08-19 LAB — GLUCOSE, CAPILLARY
Glucose-Capillary: 174 mg/dL — ABNORMAL HIGH (ref 65–99)
Glucose-Capillary: 189 mg/dL — ABNORMAL HIGH (ref 65–99)
Glucose-Capillary: 241 mg/dL — ABNORMAL HIGH (ref 65–99)
Glucose-Capillary: 221 mg/dL — ABNORMAL HIGH (ref 65–99)

## 2015-08-19 LAB — I-STAT ARTERIAL BLOOD GAS, ED
Acid-Base Excess: 3 mmol/L — ABNORMAL HIGH (ref 0.0–2.0)
Acid-Base Excess: 3 mmol/L — ABNORMAL HIGH (ref 0.0–2.0)
Bicarbonate: 31 mEq/L — ABNORMAL HIGH (ref 20.0–24.0)
Bicarbonate: 30.6 mEq/L — ABNORMAL HIGH (ref 20.0–24.0)
O2 Saturation: 95 %
O2 Saturation: 96 %
PCO2 ART: 56.6 mmHg — AB (ref 35.0–45.0)
PCO2 ART: 55.4 mmHg — AB (ref 35.0–45.0)
PH ART: 7.344 — AB (ref 7.350–7.450)
PH ART: 7.348 — AB (ref 7.350–7.450)
Patient temperature: 97.6
Patient temperature: 97.6
TCO2: 33 mmol/L (ref 0–100)
TCO2: 32 mmol/L (ref 0–100)
pO2, Arterial: 79 mmHg — ABNORMAL LOW (ref 80.0–100.0)
pO2, Arterial: 85 mmHg (ref 80.0–100.0)

## 2015-08-19 LAB — I-STAT TROPONIN, ED: Troponin i, poc: 0.01 ng/mL (ref 0.00–0.08)

## 2015-08-19 LAB — BASIC METABOLIC PANEL
Anion gap: 11 (ref 5–15)
BUN: 20 mg/dL (ref 6–20)
CHLORIDE: 96 mmol/L — AB (ref 101–111)
CO2: 25 mmol/L (ref 22–32)
Calcium: 9 mg/dL (ref 8.9–10.3)
Creatinine, Ser: 0.77 mg/dL (ref 0.44–1.00)
GFR calc Af Amer: >60 mL/min (ref >60)
GFR calc non Af Amer: >60 mL/min (ref >60)
GLUCOSE: 170 mg/dL — AB (ref 65–99)
POTASSIUM: 4.3 mmol/L (ref 3.5–5.1)
Sodium: 132 mmol/L — ABNORMAL LOW (ref 135–145)

## 2015-08-19 LAB — BRAIN NATRIURETIC PEPTIDE: B Natriuretic Peptide: 262.3 pg/mL — ABNORMAL HIGH (ref 0.0–100.0)

## 2015-08-19 MED ORDER — ACETAMINOPHEN 650 MG RE SUPP: 650.0000 mg | Freq: Four times a day (QID) | RECTAL | Status: DC | PRN

## 2015-08-19 MED ORDER — METHYLPREDNISOLONE SODIUM SUCC 40 MG IJ SOLR
40.0000 mg | Freq: Every day | INTRAMUSCULAR | Status: DC
  Administered 2015-08-19: 40 mg via INTRAVENOUS
  Filled 2015-08-19: qty 1

## 2015-08-19 MED ORDER — ALBUTEROL SULFATE (2.5 MG/3ML) 0.083% IN NEBU: 2.5000 mg | INHALATION_SOLUTION | RESPIRATORY_TRACT | Status: DC | PRN

## 2015-08-19 MED ORDER — ASPIRIN EC 81 MG PO TBEC
81.0000 mg | DELAYED_RELEASE_TABLET | Freq: Every day | ORAL | Status: DC
  Administered 2015-08-19 – 2015-08-27 (×9): 81 mg via ORAL
  Filled 2015-08-19 (×9): qty 1

## 2015-08-19 MED ORDER — VITAMIN B-12 1000 MCG PO TABS
2500.0000 ug | ORAL_TABLET | Freq: Every day | ORAL | Status: DC
  Administered 2015-08-19 – 2015-08-27 (×9): 2500 ug via ORAL
  Filled 2015-08-19 (×7): qty 3
  Filled 2015-08-19: qty 2.5
  Filled 2015-08-19: qty 3

## 2015-08-19 MED ORDER — LEVOFLOXACIN IN D5W 750 MG/150ML IV SOLN
750.0000 mg | INTRAVENOUS | Status: DC
  Administered 2015-08-19 – 2015-08-22 (×4): 750 mg via INTRAVENOUS
  Filled 2015-08-19 (×4): qty 150

## 2015-08-19 MED ORDER — LEVOTHYROXINE SODIUM 50 MCG PO TABS
50.0000 ug | ORAL_TABLET | Freq: Every day | ORAL | Status: DC
  Administered 2015-08-19 – 2015-08-27 (×9): 50 ug via ORAL
  Filled 2015-08-19 (×9): qty 1

## 2015-08-19 MED ORDER — ATORVASTATIN CALCIUM 20 MG PO TABS
20.0000 mg | ORAL_TABLET | ORAL | Status: DC
  Administered 2015-08-19 – 2015-08-27 (×6): 20 mg via ORAL
  Filled 2015-08-19 (×7): qty 1

## 2015-08-19 MED ORDER — SERTRALINE HCL 100 MG PO TABS
100.0000 mg | ORAL_TABLET | Freq: Every day | ORAL | Status: DC
  Administered 2015-08-19 – 2015-08-26 (×8): 100 mg via ORAL
  Filled 2015-08-19 (×8): qty 1

## 2015-08-19 MED ORDER — ALBUTEROL SULFATE (2.5 MG/3ML) 0.083% IN NEBU
2.5000 mg | INHALATION_SOLUTION | RESPIRATORY_TRACT | Status: DC
  Administered 2015-08-19 (×2): 2.5 mg via RESPIRATORY_TRACT
  Filled 2015-08-19 (×2): qty 3

## 2015-08-19 MED ORDER — TORSEMIDE 20 MG PO TABS
20.0000 mg | ORAL_TABLET | Freq: Two times a day (BID) | ORAL | Status: DC
  Administered 2015-08-19 – 2015-08-20 (×3): 20 mg via ORAL
  Filled 2015-08-19 (×3): qty 1

## 2015-08-19 MED ORDER — IPRATROPIUM-ALBUTEROL 0.5-2.5 (3) MG/3ML IN SOLN
3.0000 mL | Freq: Once | RESPIRATORY_TRACT | Status: AC
  Administered 2015-08-19: 3 mL via RESPIRATORY_TRACT
  Filled 2015-08-19: qty 3

## 2015-08-19 MED ORDER — ENOXAPARIN SODIUM 80 MG/0.8ML ~~LOC~~ SOLN
80.0000 mg | Freq: Every day | SUBCUTANEOUS | Status: DC
  Administered 2015-08-19 – 2015-08-21 (×3): 80 mg via SUBCUTANEOUS
  Filled 2015-08-19 (×3): qty 0.8

## 2015-08-19 MED ORDER — METHYLPREDNISOLONE SODIUM SUCC 125 MG IJ SOLR
125.0000 mg | Freq: Once | INTRAMUSCULAR | Status: AC
  Administered 2015-08-19: 125 mg via INTRAVENOUS
  Filled 2015-08-19: qty 2

## 2015-08-19 MED ORDER — METHYLPREDNISOLONE SODIUM SUCC 125 MG IJ SOLR
80.0000 mg | Freq: Four times a day (QID) | INTRAMUSCULAR | Status: DC
  Administered 2015-08-19 – 2015-08-20 (×4): 80 mg via INTRAVENOUS
  Filled 2015-08-19 (×4): qty 2

## 2015-08-19 MED ORDER — CETYLPYRIDINIUM CHLORIDE 0.05 % MT LIQD
7.0000 mL | Freq: Two times a day (BID) | OROMUCOSAL | Status: DC
  Administered 2015-08-19 – 2015-08-26 (×13): 7 mL via OROMUCOSAL

## 2015-08-19 MED ORDER — ALBUTEROL SULFATE (2.5 MG/3ML) 0.083% IN NEBU
2.5000 mg | INHALATION_SOLUTION | Freq: Four times a day (QID) | RESPIRATORY_TRACT | Status: DC
  Administered 2015-08-19: 2.5 mg via RESPIRATORY_TRACT
  Filled 2015-08-19: qty 3

## 2015-08-19 MED ORDER — ADULT MULTIVITAMIN W/MINERALS CH
0.5000 | ORAL_TABLET | Freq: Every day | ORAL | Status: DC
  Administered 2015-08-19 – 2015-08-27 (×9): 0.5 via ORAL
  Filled 2015-08-19 (×9): qty 1

## 2015-08-19 MED ORDER — BUDESONIDE 0.25 MG/2ML IN SUSP
0.2500 mg | Freq: Two times a day (BID) | RESPIRATORY_TRACT | Status: DC
  Administered 2015-08-19 – 2015-08-27 (×17): 0.25 mg via RESPIRATORY_TRACT
  Filled 2015-08-19 (×17): qty 2

## 2015-08-19 MED ORDER — IPRATROPIUM BROMIDE 0.02 % IN SOLN
0.5000 mg | Freq: Four times a day (QID) | RESPIRATORY_TRACT | Status: DC
  Administered 2015-08-19: 0.5 mg via RESPIRATORY_TRACT
  Filled 2015-08-19: qty 2.5

## 2015-08-19 MED ORDER — ONDANSETRON HCL 4 MG PO TABS: 4.0000 mg | ORAL_TABLET | Freq: Four times a day (QID) | ORAL | Status: DC | PRN

## 2015-08-19 MED ORDER — POTASSIUM CHLORIDE CRYS ER 20 MEQ PO TBCR
20.0000 meq | EXTENDED_RELEASE_TABLET | Freq: Every day | ORAL | Status: DC
  Administered 2015-08-19 – 2015-08-24 (×6): 20 meq via ORAL
  Filled 2015-08-19 (×6): qty 1

## 2015-08-19 MED ORDER — LIRAGLUTIDE 18 MG/3ML ~~LOC~~ SOPN
1.8000 mg | PEN_INJECTOR | Freq: Every day | SUBCUTANEOUS | Status: DC
  Administered 2015-08-19 – 2015-08-25 (×7): 1.8 mg via SUBCUTANEOUS

## 2015-08-19 MED ORDER — INSULIN ASPART 100 UNIT/ML ~~LOC~~ SOLN
0.0000 [IU] | Freq: Three times a day (TID) | SUBCUTANEOUS | Status: DC
Start: 2015-08-19 — End: 2015-08-27
  Administered 2015-08-19 (×3): 2 [IU] via SUBCUTANEOUS
  Administered 2015-08-20: 7 [IU] via SUBCUTANEOUS
  Administered 2015-08-20: 3 [IU] via SUBCUTANEOUS
  Administered 2015-08-20: 5 [IU] via SUBCUTANEOUS
  Administered 2015-08-21 (×3): 2 [IU] via SUBCUTANEOUS
  Administered 2015-08-22 (×2): 3 [IU] via SUBCUTANEOUS
  Administered 2015-08-23: 5 [IU] via SUBCUTANEOUS
  Administered 2015-08-24: 3 [IU] via SUBCUTANEOUS
  Administered 2015-08-24 (×2): 2 [IU] via SUBCUTANEOUS
  Administered 2015-08-25: 1 [IU] via SUBCUTANEOUS
  Administered 2015-08-25: 2 [IU] via SUBCUTANEOUS
  Administered 2015-08-25: 1 [IU] via SUBCUTANEOUS
  Administered 2015-08-26: 3 [IU] via SUBCUTANEOUS
  Administered 2015-08-26 – 2015-08-27 (×3): 2 [IU] via SUBCUTANEOUS

## 2015-08-19 MED ORDER — FUROSEMIDE 10 MG/ML IJ SOLN: 40.0000 mg | Freq: Once | INTRAMUSCULAR | Status: DC

## 2015-08-19 MED ORDER — ONDANSETRON HCL 4 MG/2ML IJ SOLN: 4.0000 mg | Freq: Four times a day (QID) | INTRAMUSCULAR | Status: DC | PRN

## 2015-08-19 MED ORDER — INSULIN ASPART PROT & ASPART (70-30 MIX) 100 UNIT/ML ~~LOC~~ SUSP
30.0000 [IU] | Freq: Two times a day (BID) | SUBCUTANEOUS | Status: DC
  Administered 2015-08-19 – 2015-08-25 (×14): 30 [IU] via SUBCUTANEOUS
  Filled 2015-08-19: qty 10

## 2015-08-19 MED ORDER — SPIRONOLACTONE 25 MG PO TABS
25.0000 mg | ORAL_TABLET | Freq: Every day | ORAL | Status: DC
  Administered 2015-08-19 – 2015-08-27 (×9): 25 mg via ORAL
  Filled 2015-08-19 (×9): qty 1

## 2015-08-19 MED ORDER — IPRATROPIUM BROMIDE 0.02 % IN SOLN
0.5000 mg | RESPIRATORY_TRACT | Status: DC
  Administered 2015-08-19 (×2): 0.5 mg via RESPIRATORY_TRACT
  Filled 2015-08-19 (×2): qty 2.5

## 2015-08-19 MED ORDER — NITROGLYCERIN 2 % TD OINT
1.0000 [in_us] | TOPICAL_OINTMENT | Freq: Once | TRANSDERMAL | Status: AC
  Administered 2015-08-19: 1 [in_us] via TOPICAL
  Filled 2015-08-19: qty 1

## 2015-08-19 MED ORDER — CHLORHEXIDINE GLUCONATE 0.12 % MT SOLN
15.0000 mL | Freq: Two times a day (BID) | OROMUCOSAL | Status: DC
  Administered 2015-08-19 – 2015-08-27 (×17): 15 mL via OROMUCOSAL
  Filled 2015-08-19 (×17): qty 15

## 2015-08-19 MED ORDER — ACETAMINOPHEN 325 MG PO TABS: 650.0000 mg | ORAL_TABLET | Freq: Four times a day (QID) | ORAL | Status: DC | PRN

## 2015-08-19 NOTE — Progress Notes (Signed)
Patient placed on CPAP for HS.  Patient is unsure of home CPAP settings.  Pressure adjusted for patient comfort to 14cmH20.  FFM used, per home regimen.  Patient is familiar with procedure.

## 2015-08-19 NOTE — Progress Notes (Signed)
11:02 AM I agree with HPI/GPe and A/P per Dr. Leeanne Mannan      75 year old female Body mass index is 68.73 kg/(m^2). 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 Elsworth Soho PASP 56 mm hg, EF 50-55%, grd 1 DD-Cath 11/2009=normal coronaries Bipolar Hepatic mass? hemangioma Known history of COPD stage  Admitted from [facility] emergency room early 08/19/15 with shortness of breath. 5 wk h/o nasal stuffiness Saw Dr. Tonita Phoenix at [place] PCP -eventually Rx patient with doxycycline q12 Had been on this for ~ 2 days Didn't feel better and couldn't breathe  Received x2 breathing Rx in EMS- by paramedics 3/29 Nurse came to assess her on 3/30--couldn't bathe herself [her norm]  Doesn't use oxygen at baseline   Has been having productive sputum as well and feeling poorly over the course of 3/29 and 3/30. ABG on admission showed pH 7.34, PCO2 56, PaO2 79-repeat ABG showed pH 7.34 CO2 55 and PaO2 85. She was placed on BiPAP with an FiO2 of 45 and admitted to step down unit.  Sodium 132 chloride 96 BUN/creatinine 20/0.840 care troponin 0.06 with Atrovent to 0.07, proBNP 262. Chest x-ray = cardiomegaly with no acute consolidation or other findings.  Patient Active Problem List   Diagnosis Date Noted  . COPD exacerbation ([place]) 08/19/2015  . Acute respiratory failure with hypoxia and hypercarbia ([place]) 08/19/2015  . Acute respiratory failure ([place]) 08/19/2015  . Acute on chronic congestive heart failure ([place])   . Rash and nonspecific skin eruption 11/27/2014  . Post-menopausal bleeding 06/18/2011  . Varicose veins 04/25/2011  . Unspecified venous (peripheral) insufficiency 04/25/2011  . Hemangioma of liver, left side 03/23/2011  . HYPOTHYROIDISM 04/27/2010  . Type II or unspecified type diabetes mellitus without mention of complication, not stated as uncontrolled 04/27/2010  . HYPOKALEMIA 04/27/2010  . Morbid obesity ([place]) 04/27/2010  . DEPRESSION 04/27/2010  .  Obstructive sleep apnea 04/27/2010  . Essential hypertension 04/27/2010  . Congestive heart failure ([place]) 04/27/2010  . Obesity hypoventilation syndrome ([place]) 04/27/2010    Currently looks much better feels much better and is eating and drinking at the bedside without issue only using nasal cannula at this time however does not use oxygen at home. She is willing to sit up in the chair and eat lunch and at baseline she is able to walk around the house but herself and her Tylenol etc. etc.  She tells me that she's gained about 10-15 pounds recently although she does not think that this is fluid weight. Given her elevated BMI I do think that she may benefit from a may be slightly higher doses of her torsemide as well as Zaroxolyn  If she continues to do well over the next 24 hours she can possibly transfer out of step down unit I will let her primary pulmonologist know how she is doing  Verneita Griffes, MD [facility] ([phone]

## 2015-08-19 NOTE — ED Provider Notes (Signed)
CSN: [id number]     Arrival date & time 08/19/15  0038 History  By signing my name below, I, Arianna Nassar, attest that this documentation has been prepared under the direction and in the presence of Veryl Speak, MD. Electronically Signed: Julien Nordmann, ED Scribe. 08/19/2015. 12:54 AM.    Chief Complaint  Patient presents with  . Respiratory Distress      The history is provided by the patient and the EMS personnel. No language interpreter was used.   HPI Comments: Nina Howell is a 75 y.o. female with a PMHx of CHF, hypoventilation, hypokalemia, CHF, DMII, hypothyroidism, HTN, anemia, and HLD brought in by ambulance, who presents to the Emergency Department complaining of sudden onset, gradual worsening shortness of breath with associated chest tightness onset PTA. According to EMS, they were called out the to pt's house for the same symptoms last night. Tonight she endorsed increased shortness of breath and chest tightness. Per EMS, pt has been extremely tachypnea with  fine crackles heard. She received one nitro and CPAP with her O2 sats being 94-96. Pt has been on CPAP for about 1 hour and has been in a-fib shown on the monitor. She states she gained 6 lbs since yesterday and has had increased BLE and BUE swelling. Pt states she feels hot. Pt had pneumonia in November and January. Denies any other complaints.  Past Medical History  Diagnosis Date  . Cellulitis     ABDOMINAL WALL  . CHF (congestive heart failure) (HCC)     DECOMPENSATED SYSTOLIC CHF  . Hypoventilation   . Hypokalemia   . CHF (congestive heart failure) ([place])   . Diabetes mellitus, type 2 ([place])   . Hypothyroidism   . Varicose veins   . Morbid obesity ([place])   . IUD (intrauterine device) in place   . DJD (degenerative joint disease)   . Hypertension   . Anemia   . Hepatic lesion   . Asymptomatic cholelithiasis   . Hyperlipidemia   . CTS (carpal tunnel syndrome)   . Hyperplasia of endometrium determined by  biopsy   . Acute pancreatitis   . Sleep apnea     cpap  . Rash and nonspecific skin eruption     all over- 12-17-14 "drying in _ Auto immune problem"-seeing dermalogist   Past Surgical History  Procedure Laterality Date  . Shoulder surgery  2009    RIGHT SHOULDER  . Cardiac catheterization  2011    pt states "clean report"  . Tubal ligation    . Dilation and curettage of uterus    . Colonoscopy with propofol N/A 12/24/2014    Procedure: COLONOSCOPY WITH PROPOFOL;  Surgeon: Carol Ada, MD;  Location: [facility];  Service: Endoscopy;  Laterality: N/A;   Family History  Problem Relation Age of Onset  . Cancer Mother     throat  . Diabetes Father   . Leukemia Father    Social History  Substance Use Topics  . Smoking status: Never Smoker   . Smokeless tobacco: Never Used  . Alcohol Use: No   OB History    No data available     Review of Systems  A complete 10 system review of systems was obtained and all systems are negative except as noted in the HPI and PMH.    Allergies  Celebrex; Atorvastatin; Cephalexin; Cephalosporins; Oxycodone-acetaminophen; Penicillins; and Oxycodone  Home Medications   Prior to Admission medications   Medication Sig Start Date End Date Taking? Authorizing Provider  aspirin  81 MG tablet Take 81 mg by mouth daily.    Historical Provider, MD  atorvastatin (LIPITOR) 20 MG tablet Take 20 mg by mouth every other day.    Historical Provider, MD  Cholecalciferol (VITAMIN D3) 1000 UNITS CAPS Take 1 tablet by mouth daily.      Historical Provider, MD  CRANBERRY PO Take 4 tablets by mouth daily.     Historical Provider, MD  Cyanocobalamin (VITAMIN B-12) 2500 MCG SUBL Place 1 tablet under the tongue daily.    Historical Provider, MD  fluocinonide (LIDEX) 0.05 % external solution Apply 1 application topically 2 (two) times daily.    Historical Provider, MD  insulin NPH-regular Human (NOVOLIN 70/30) (70-30) 100 UNIT/ML injection Inject 25-30 Units into the  skin 2 (two) times daily with a meal.     Historical Provider, MD  levothyroxine (SYNTHROID, LEVOTHROID) 50 MCG tablet Take 50 mcg by mouth daily.      Historical Provider, MD  Liraglutide (VICTOZA) 18 MG/3ML SOPN Inject 1.8 mg into the skin daily.    Historical Provider, MD  metFORMIN (GLUCOPHAGE) 1000 MG tablet Take 1,000 mg by mouth 2 (two) times daily with a meal.    Historical Provider, MD  Multiple Vitamin (MULTIVITAMIN WITH MINERALS) TABS tablet Take 0.5 tablets by mouth daily.    Historical Provider, MD  potassium chloride SA (K-DUR,KLOR-CON) 20 MEQ tablet Take 20 mEq by mouth daily.     Historical Provider, MD  sertraline (ZOLOFT) 50 MG tablet Take 50 mg by mouth at bedtime.     Historical Provider, MD  spironolactone (ALDACTONE) 25 MG tablet Take 1 tablet (25 mg total) by mouth daily. 05/31/15   Josue Hector, MD  torsemide (DEMADEX) 20 MG tablet Take 0.5 tablets (10 mg total) by mouth 2 (two) times daily. 05/31/15   Josue Hector, MD   Vitals: SpO2 96% Physical Exam  Constitutional: She is oriented to person, place, and time. She appears well-developed and well-nourished. No distress.  Morbidly obese  HENT:  Head: Normocephalic and atraumatic.  Eyes: EOM are normal.  Neck: Normal range of motion.  Cardiovascular: Normal rate, regular rhythm and normal heart sounds.   Pulmonary/Chest: She is in respiratory distress. She has rales.  She is in mild to moderate respiratory distress. There are slight rales audible bilaterally.  Abdominal: Soft. She exhibits no distension. There is no tenderness.  Musculoskeletal: Normal range of motion. She exhibits edema.  2+ pitting edema in both lower extremities.  Neurological: She is alert and oriented to person, place, and time.  Skin: Skin is warm and dry.  Psychiatric: She has a normal mood and affect. Judgment normal.  Nursing note and vitals reviewed.   ED Course  Procedures  DIAGNOSTIC STUDIES: Oxygen Saturation is 96% on CPAP,  normal by my interpretation.  COORDINATION OF CARE:  12:51 AM Discussed treatment plan which includes lab work and IV fluids with pt at bedside and pt agreed to plan.  Labs Review Labs Reviewed - No data to display  Imaging Review No results found. I have personally reviewed and evaluated these images and lab results as part of my medical decision-making.   EKG Interpretation   Date/Time:  Friday August 19 2015 00:51:06 EDT Ventricular Rate:  89 PR Interval:  207 QRS Duration: 117 QT Interval:  464 QTC Calculation: 565 R Axis:   -97 Text Interpretation:  Sinus rhythm Nonspecific IVCD with LAD Inferior  infarct, old Probable anterolateral infarct, old Baseline wander in  lead(s) V1  V2 No significant change since 06/15/2011 Confirmed by Talvin Christianson  MD,  Secilia Apps ([id number]) on 08/19/2015 12:56:04 AM      MDM   Final diagnoses:  None   Patient brought by EMS for evaluation of respiratory distress. She has a history of CHF and COPD. I suspect her worsening is multifactorial and related to a combination of both of these. She was given Solu-Medrol and albuterol as well as nitroglycerin, however not Lasix as she is allergic to this. She appeared more comfortable once placed on BiPAP. Her ABG revealed a slight respiratory acidosis which did not change after an hour on the BiPAP. I have spoken with Dr. Hal Hope who agrees to admit.  I personally performed the services described in this documentation, which was scribed in my presence. The recorded information has been reviewed and is accurate.      Veryl Speak, MD 08/19/15 [phone]

## 2015-08-19 NOTE — H&P (Signed)
[facility] History and Physical  Nina Howell [id number] DOB: 1940/05/29 DOA: 08/19/2015  Referring physician: Dr. Stark Jock. PCP: Maximino Greenland, MD  Specialists: Dr. Elsworth Soho. Pulmonologist.  Chief Complaint: Shortness of breath.  HPI: Nina Howell is a 75 y.o. female with history of diastolic CHF, OSA, COPD/asthma presents to the ER because of increasing shortness of breath. Patient states her symptoms started 3 weeks ago which has gradually worsened. Her primary care had called in antibiotics 3 days ago because of increasing productive sputum. Despite taking the patient is short of breath and presents to the ER. EMS had placed patient on BiPAP at home. ABG shows hypercarbic respiratory failure. Patient denies any chest pain fever chills. Patient's shortness of breath is present at rest and increases on exertion. On exam patient has no lower extremity edema. Has been compliant with her medications and CPAP.   Review of Systems: As presented in the history of presenting illness, rest negative.  Past Medical History  Diagnosis Date  . Cellulitis     ABDOMINAL WALL  . CHF (congestive heart failure) (HCC)     DECOMPENSATED SYSTOLIC CHF  . Hypoventilation   . Hypokalemia   . CHF (congestive heart failure) ([place])   . Diabetes mellitus, type 2 ([place])   . Hypothyroidism   . Varicose veins   . Morbid obesity ([place])   . IUD (intrauterine device) in place   . DJD (degenerative joint disease)   . Hypertension   . Anemia   . Hepatic lesion   . Asymptomatic cholelithiasis   . Hyperlipidemia   . CTS (carpal tunnel syndrome)   . 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 Acute pancreatitis   . Sleep apnea     cpap  . Rash and nonspecific skin eruption     all over- 12-17-14 "drying in _ Auto immune problem"-seeing dermalogist   Past Surgical History  Procedure Laterality Date  . Shoulder surgery  2009    RIGHT SHOULDER  . Cardiac catheterization  2011    pt  states "clean report"  . Tubal ligation    . Dilation and curettage of uterus    . Colonoscopy with propofol N/A 12/24/2014    Procedure: COLONOSCOPY WITH PROPOFOL;  Surgeon: Carol Ada, MD;  Location: [facility];  Service: Endoscopy;  Laterality: N/A;   Social History:  reports that she has never smoked. She has never used smokeless tobacco. She reports that she does not drink alcohol or use illicit drugs. Where does patient live Home. Can patient participate in ADLs? Yes.  Allergies  Allergen Reactions  . Celebrex [Celecoxib] Swelling  . Atorvastatin Other (See Comments)  . Cephalexin     REACTION: hives  . Cephalosporins     REACTION: rash  . Lasix [Furosemide] Hives  . Oxycodone-Acetaminophen Nausea Only  . Penicillins Other (See Comments)    Not known  . Oxycodone Anxiety    Felt weird    Family History:  Family History  Problem Relation Age of Onset  . Cancer Mother     throat  . Diabetes Father   . Leukemia Father       Prior to Admission medications   Medication Sig Start Date End Date Taking? Authorizing Provider  albuterol (PROVENTIL HFA;VENTOLIN HFA) 108 (90 Base) MCG/ACT inhaler Inhale 2 puffs into the lungs every 4 (four) hours as needed for wheezing or shortness of breath.   Yes Historical Provider, MD  aspirin 81 MG tablet Take 81 mg by mouth daily.  Yes Historical Provider, MD  atorvastatin (LIPITOR) 20 MG tablet Take 20 mg by mouth every other day.   Yes Historical Provider, MD  beclomethasone (QVAR) 80 MCG/ACT inhaler Inhale 2 puffs into the lungs 2 (two) times daily.   Yes Historical Provider, MD  Cholecalciferol (VITAMIN D3) 1000 UNITS CAPS Take 1 tablet by mouth daily.     Yes Historical Provider, MD  CRANBERRY PO Take 3 tablets by mouth daily.    Yes Historical Provider, MD  Cyanocobalamin (VITAMIN B-12) 2500 MCG SUBL Place 1 tablet under the tongue daily.   Yes Historical Provider, MD  doxycycline (VIBRAMYCIN) 100 MG capsule Take 100 mg by mouth 2  (two) times daily.   Yes Historical Provider, MD  fluocinonide (LIDEX) 0.05 % external solution Apply 1 application topically 2 (two) times daily as needed (dermatosis).    Yes Historical Provider, MD  insulin NPH-regular Human (NOVOLIN 70/30) (70-30) 100 UNIT/ML injection Inject 25-30 Units into the skin 2 (two) times daily with a meal. 25 units in the morning and 30 units at bedtime   Yes Historical Provider, MD  levothyroxine (SYNTHROID, LEVOTHROID) 50 MCG tablet Take 50 mcg by mouth daily.     Yes Historical Provider, MD  Liraglutide (VICTOZA) 18 MG/3ML SOPN Inject 1.8 mg into the skin daily.   Yes Historical Provider, MD  metFORMIN (GLUCOPHAGE) 1000 MG tablet Take 1,000 mg by mouth 2 (two) times daily with a meal.   Yes Historical Provider, MD  Multiple Vitamin (MULTIVITAMIN WITH MINERALS) TABS tablet Take 0.5 tablets by mouth daily.   Yes Historical Provider, MD  potassium chloride SA (K-DUR,KLOR-CON) 20 MEQ tablet Take 20 mEq by mouth every morning.    Yes Historical Provider, MD  sertraline (ZOLOFT) 50 MG tablet Take 100 mg by mouth at bedtime.    Yes Historical Provider, MD  spironolactone (ALDACTONE) 25 MG tablet Take 1 tablet (25 mg total) by mouth daily. 05/31/15  Yes Josue Hector, MD  torsemide (DEMADEX) 20 MG tablet Take 0.5 tablets (10 mg total) by mouth 2 (two) times daily. 05/31/15  Yes Josue Hector, MD    Physical Exam: Filed Vitals:   08/19/15 0115 08/19/15 0326 08/19/15 0352 08/19/15 0424  BP: 111/95  119/85 119/85  Pulse: 90  96 88  Temp:  98 F (36.7 C) 97.6 F (36.4 C)   TempSrc:  Axillary Axillary   Resp: 28  27 22   Height:   5\' 1"  (1.549 m)   Weight:   363 lb 8.6 oz (164.9 kg)   SpO2: 97%  95% 97%     General:  Obese not in distress.  Eyes: Anicteric no pallor.  ENT: No discharge from the ears eyes nose and mouth.  Neck: No mass felt. No JVD appreciated.  Cardiovascular: S1 and S2 heard.  Respiratory: Crepitations present mild wheezing.  Abdomen:  Soft nontender bowel sounds present.  Skin: No rash.  Musculoskeletal: No edema.  Psychiatric: Appears normal.  Neurologic: Alert awake oriented to time place and person. Moves all extremities.  Labs on Admission:  Basic Metabolic Panel:  Recent Labs Lab 08/19/15 0123  NA 132*  K 4.3  CL 96*  CO2 25  GLUCOSE 170*  BUN 20  CREATININE 0.77  CALCIUM 9.0   Liver Function Tests: No results for input(s): AST, ALT, ALKPHOS, BILITOT, PROT, ALBUMIN in the last 168 hours. No results for input(s): LIPASE, AMYLASE in the last 168 hours. No results for input(s): AMMONIA in the last 168 hours. CBC:  Recent Labs Lab 08/19/15 0123  WBC 13.6*  NEUTROABS 10.8*  HGB 12.0  HCT 37.7  MCV 86.1  PLT 396   Cardiac Enzymes: No results for input(s): CKTOTAL, CKMB, CKMBINDEX, TROPONINI in the last 168 hours.  BNP (last 3 results)  Recent Labs  08/19/15 0123  BNP 262.3*    ProBNP (last 3 results) No results for input(s): PROBNP in the last 8760 hours.  CBG: No results for input(s): GLUCAP in the last 168 hours.  Radiological Exams on Admission: Dg Chest Port 1 View  08/19/2015  CLINICAL DATA:  75 year old female with shortness of breath. History of CHF. EXAM: PORTABLE CHEST 1 VIEW COMPARISON:  Chest radiograph dated 04/13/2010 FINDINGS: Single portable view of the chest demonstrate moderate cardiomegaly. There is prominence of the central vasculature and interstitial markings likely mild congestive changes. Bibasilar hazy atelectatic changes noted. Developing pneumonia is not excluded. There is no pneumothorax. The left costophrenic angle has been excluded from the image. Trace right pleural effusion may be present. No acute osseous pathology identified. IMPRESSION: Cardiomegaly with mild congestive changes.  No focal consolidation. Electronically Signed   By: Anner Crete M.D.   On: 08/19/2015 01:28    EKG: Independently reviewed. Normal sinus rhythm with  IVCD.  Assessment/Plan Principal Problem:   Acute respiratory failure with hypoxia and hypercarbia (HCC) Active Problems:   Obstructive sleep apnea   Essential hypertension   Congestive heart failure (HCC)   COPD exacerbation (HCC)   Acute respiratory failure (HCC)   Acute on chronic congestive heart failure ([place])   1. Acute respiratory failure with hypoxia and hypercarbia - probably a combination of COPD/asthma or say and CHF. At this time patient has received Solu-Medrol in the ER. We will continue with BiPAP and I have placed patient on empiric antibiotics Pulmicort nebulizer treatment along with increasing her dose of Demadex from 10 mg twice a day to 20 mg twice a day (note that patient is allergic to Lasix which causes her to have skin eruptions pemphigoid). Closely follow intake output metabolic panel and daily weights. Patient's last 2-D echo done in 2016 shows EF of 50-55% with grade 1 diastolic dysfunction.  2. Diabetes mellitus type 2 - since patient is on steroids closely follow CBCs for worsening hyperglycemia. Patient is on NovoLog 70/30 which I have placed a 30 units twice a day with slight increase in dose. Patient will be also on sliding scale coverage. Continue Victoza. Hold metformin. 3. Hyperlipidemia on statins. 4. Hypothyroidism on Synthroid. 5. Morbid obesity.   DVT Prophylaxis Lovenox.  Code Status: Full code.  Family Communication: Discussed with patient.  Disposition Plan: Admit to inpatient.    KAKRAKANDY,ARSHAD N. [facility] Pager [phone].  If 7PM-7AM, please contact night-coverage [email or web address] Password [facility] 08/19/2015, 4:41 AM

## 2015-08-19 NOTE — Telephone Encounter (Signed)
Called patient at hospital, she said that she has been at the hospital all night and they have her on breathing machine and oxygen.  She said that she cannot breath, she is not doing any better in the hospital.  Patient states that she just wanted to make Dr. Elsworth Soho aware where she is in case he wants to come by to visit her in the hospital.    She is located at [facility] to Dr. Elsworth Soho.

## 2015-08-19 NOTE — ED Notes (Signed)
Pt brought to ED by [facility] from home for Respiratory distress, pt SPO2 88% on RA, pt placed on CPAP by [facility] PTA with a SPO2 96%, very labored breathing on ED arrival. 1 nitro sl given by [facility] PTA, pt denies any CP, n/v, fevers or chills. Pt states she is a CHF pt and gain around 6 pounds from yesterday to today. Lasix dc by PCP due to an allergy reaction to the medication.

## 2015-08-19 NOTE — Progress Notes (Signed)
Pharmacy Antibiotic Note  Nina Howell is a 75 y.o. female admitted on 08/19/2015 with Respiratory Distress.  Pharmacy has been consulted for Levaquin dosing for bronchitis. WBC mildly elevated, renal function ok, other labs reviewed. Noted patient with PCN/cephalosporin allergies.   Plan: -Levaquin 750 mg IV q24h -F/U infectious work-up  Height: 5\' 1"  (154.9 cm) Weight: (!) 363 lb 8.6 oz (164.9 kg) IBW/kg (Calculated) : 47.8  Temp (24hrs), Avg:97.6 F (36.4 C), Min:97.2 F (36.2 C), Max:98 F (36.7 C)   Recent Labs Lab 08/19/15 0123  WBC 13.6*  CREATININE 0.77    Estimated Creatinine Clearance: 93.5 mL/min (by C-G formula based on Cr of 0.77).    Allergies  Allergen Reactions  . Celebrex [Celecoxib] Swelling  . Atorvastatin Other (See Comments)  . Cephalexin     REACTION: hives  . Cephalosporins     REACTION: rash  . Lasix [Furosemide] Hives  . Oxycodone-Acetaminophen Nausea Only  . Penicillins Other (See Comments)    Not known  . Oxycodone Anxiety    Felt weird    Narda Bonds 08/19/2015 4:45 AM

## 2015-08-20 LAB — BASIC METABOLIC PANEL
Anion gap: 10 (ref 5–15)
BUN: 23 mg/dL — ABNORMAL HIGH (ref 6–20)
CHLORIDE: 92 mmol/L — AB (ref 101–111)
CO2: 32 mmol/L (ref 22–32)
Calcium: 9.2 mg/dL (ref 8.9–10.3)
Creatinine, Ser: 0.83 mg/dL (ref 0.44–1.00)
GFR calc non Af Amer: >60 mL/min (ref >60)
Glucose, Bld: 257 mg/dL — ABNORMAL HIGH (ref 65–99)
POTASSIUM: 4.7 mmol/L (ref 3.5–5.1)
SODIUM: 134 mmol/L — AB (ref 135–145)

## 2015-08-20 LAB — CBC WITH DIFFERENTIAL/PLATELET
Basophils Absolute: 0 10*3/uL (ref 0.0–0.1)
Basophils Relative: 0 %
EOS ABS: 0 10*3/uL (ref 0.0–0.7)
Eosinophils Relative: 0 %
HEMATOCRIT: 35 % — AB (ref 36.0–46.0)
HEMOGLOBIN: 10.8 g/dL — AB (ref 12.0–15.0)
LYMPHS ABS: 0.8 10*3/uL (ref 0.7–4.0)
LYMPHS PCT: 9 %
MCH: 26.9 pg (ref 26.0–34.0)
MCHC: 30.9 g/dL (ref 30.0–36.0)
MCV: 87.1 fL (ref 78.0–100.0)
MONOS PCT: 4 %
Monocytes Absolute: 0.3 10*3/uL (ref 0.1–1.0)
NEUTROS ABS: 7.9 10*3/uL — AB (ref 1.7–7.7)
NEUTROS PCT: 87 %
Platelets: 379 10*3/uL (ref 150–400)
RBC: 4.02 MIL/uL (ref 3.87–5.11)
RDW: 16.6 % — ABNORMAL HIGH (ref 11.5–15.5)
WBC: 9 10*3/uL (ref 4.0–10.5)

## 2015-08-20 LAB — GLUCOSE, CAPILLARY
GLUCOSE-CAPILLARY: 241 mg/dL — AB (ref 65–99)
GLUCOSE-CAPILLARY: 300 mg/dL — AB (ref 65–99)
Glucose-Capillary: 305 mg/dL — ABNORMAL HIGH (ref 65–99)
Glucose-Capillary: 205 mg/dL — ABNORMAL HIGH (ref 65–99)

## 2015-08-20 MED ORDER — PREDNISONE 20 MG PO TABS: 60.0000 mg | ORAL_TABLET | Freq: Every day | ORAL | Status: DC

## 2015-08-20 MED ORDER — TORSEMIDE 20 MG PO TABS
40.0000 mg | ORAL_TABLET | Freq: Two times a day (BID) | ORAL | Status: DC
  Administered 2015-08-20 – 2015-08-23 (×6): 40 mg via ORAL
  Filled 2015-08-20 (×6): qty 2

## 2015-08-20 MED ORDER — IPRATROPIUM-ALBUTEROL 0.5-2.5 (3) MG/3ML IN SOLN
3.0000 mL | Freq: Four times a day (QID) | RESPIRATORY_TRACT | Status: DC
  Administered 2015-08-20 – 2015-08-21 (×6): 3 mL via RESPIRATORY_TRACT
  Filled 2015-08-20 (×7): qty 3

## 2015-08-20 MED ORDER — ALBUTEROL SULFATE (2.5 MG/3ML) 0.083% IN NEBU
2.5000 mg | INHALATION_SOLUTION | RESPIRATORY_TRACT | Status: DC
  Administered 2015-08-20 – 2015-08-21 (×3): 2.5 mg via RESPIRATORY_TRACT
  Filled 2015-08-20 (×3): qty 3

## 2015-08-20 MED ORDER — PREDNISONE 20 MG PO TABS
60.0000 mg | ORAL_TABLET | Freq: Every day | ORAL | Status: DC
Start: 2015-08-20 — End: 2015-08-22
  Administered 2015-08-20 – 2015-08-22 (×3): 60 mg via ORAL
  Filled 2015-08-20 (×3): qty 3

## 2015-08-20 MED ORDER — TORSEMIDE 20 MG PO TABS
20.0000 mg | ORAL_TABLET | Freq: Once | ORAL | Status: AC
  Administered 2015-08-20: 20 mg via ORAL
  Filled 2015-08-20: qty 1

## 2015-08-20 NOTE — Progress Notes (Signed)
Nina Howell [id number] DOB: 12/19/1940 DOA: 08/19/2015 PCP: Maximino Greenland, MD  Brief narrative:  75 year old female Body mass index is 68.73 kg/(m^2). 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 Elsworth Soho PASP 56 mm hg, EF 50-55%, grd 1 DD-Cath 11/2009=normal coronaries Bipolar Hepatic mass? hemangioma Known history of COPD stage  Admitted from [facility] emergency room early 08/19/15 with shortness of breath. 5 wk h/o nasal stuffiness Saw Dr. Tonita Phoenix at [facility] PCP -eventually Rx patient with doxycycline q12 Had been on this for ~ 2 days Didn't feel better and couldn't breathe  Received x2 breathing Rx in EMS- by paramedics 3/29 Nurse came to assess her on 3/30--couldn't bathe herself [her norm]  Doesn't use oxygen at baseline   Has been having productive sputum as well and feeling poorly over the course of 3/29 and 3/30. ABG on admission showed pH 7.34, PCO2 56, PaO2 79-repeat ABG showed pH 7.34 CO2 55 and PaO2 85. She was placed on BiPAP with an FiO2 of 45 and admitted to step down unit.  Sodium 132 chloride 96 BUN/creatinine 20/0.840 care troponin 0.06 with Atrovent to 0.07, proBNP 262. Chest x-ray = cardiomegaly with no acute consolidation or other findings.   Antibiotics:  levaquin   Subjective   Alert pleasant oriented Tells me that he has not used inhaler properly in the past No nausea no vomiting no chest pain Tolerating diet No shortness of breath but O2 sats in the chair are 89% while sitting Does not feel particularly swollen lower extremities   Objective    Interim History:   Telemetry: sinus   Objective: Filed Vitals:   08/20/15 0442 08/20/15 0600 08/20/15 0805 08/20/15 0836  BP: 108/72   132/68  Pulse: 67   81  Temp: 97.8 F (36.6 C)   97.7 F (36.5 C)  TempSrc: Axillary   Oral  Resp: 17   24  Height:  5\' 1"  (1.549 m)    Weight:  160.573 kg (354 lb)    SpO2: 96%  93% 93%     Intake/Output Summary (Last 24 hours) at 08/20/15 0941 Last data filed at 08/20/15 [id number]  Gross per 24 hour  Intake    150 ml  Output      0 ml  Net    150 ml    Exam:  General: eomi ncat Morbid obesity, Body mass index is 66.92 kg/(m^2). Cardiovascular: s1 s 2no m/r/g Respiratory: clear no added soudn Abdomen:  Soft obese nt nd Skin no overt swelling, but trace edema noted NIntact moving all 4 limbs equally  Data Reviewed: Basic Metabolic Panel:  Recent Labs Lab 08/19/15 0123 08/19/15 0506 08/20/15 0343  NA 132* 132* 134*  K 4.3 4.8 4.7  CL 96* 96* 92*  CO2 25 26 32  GLUCOSE 170* 168* 257*  BUN 20 20 23*  CREATININE 0.77 0.80 0.83  CALCIUM 9.0 9.1 9.2   Liver Function Tests:  Recent Labs Lab 08/19/15 0506  AST 26  ALT 37  ALKPHOS 120  BILITOT 0.7  PROT 7.5  ALBUMIN 2.9*   No results for input(s): LIPASE, AMYLASE in the last 168 hours. No results for input(s): AMMONIA in the last 168 hours. CBC:  Recent Labs Lab 08/19/15 0123 08/19/15 0506 08/20/15 0343  WBC 13.6* 12.8* 9.0  NEUTROABS 10.8* 12.0* 7.9*  HGB 12.0 12.0 10.8*  HCT 37.7 37.2 35.0*  MCV 86.1 87.3 87.1  PLT 396 390 379   Cardiac Enzymes:  Recent Labs  Lab 08/19/15 0506 08/19/15 0936 08/19/15 1630  TROPONINI 0.06* 0.07* 0.04*   BNP: Invalid input(s): POCBNP CBG:  Recent Labs Lab 08/19/15 0810 08/19/15 1209 08/19/15 1606 08/19/15 2118 08/20/15 0832  GLUCAP 174* 189* 241* 221* 241*    Recent Results (from the past 240 hour(s))  MRSA PCR Screening     Status: None   Collection Time: 08/19/15  4:00 AM  Result Value Ref Range Status   MRSA by PCR NEGATIVE NEGATIVE Final    Comment:        The GeneXpert MRSA Assay (FDA approved for NASAL specimens only), is one component of a comprehensive MRSA colonization surveillance program. It is not intended to diagnose MRSA infection nor to guide or monitor treatment for MRSA infections.      Studies:               All Imaging reviewed and is as per above notation   Scheduled Meds: . antiseptic oral rinse  7 mL Mouth Rinse q12n4p  . aspirin EC  81 mg Oral Daily  . atorvastatin  20 mg Oral QODAY  . budesonide (PULMICORT) nebulizer solution  0.25 mg Nebulization BID  . chlorhexidine  15 mL Mouth Rinse BID  . enoxaparin (LOVENOX) injection  80 mg Subcutaneous Daily  . insulin aspart  0-9 Units Subcutaneous TID WC  . insulin aspart protamine- aspart  30 Units Subcutaneous BID WC  . ipratropium-albuterol  3 mL Nebulization QID  . levofloxacin (LEVAQUIN) IV  750 mg Intravenous Q24H  . levothyroxine  50 mcg Oral QAC breakfast  . Liraglutide  1.8 mg Subcutaneous Daily  . methylPREDNISolone (SOLU-MEDROL) injection  80 mg Intravenous Q6H  . multivitamin with minerals  0.5 tablet Oral Daily  . potassium chloride SA  20 mEq Oral Daily  . sertraline  100 mg Oral QHS  . spironolactone  25 mg Oral Daily  . torsemide  20 mg Oral BID  . vitamin B-12  2,500 mcg Oral Daily   Continuous Infusions:    Assessment/Plan:  1. Multifactorial dyspnea-CHF + COPD + OSA + restrictive component-MRC grade 2 to 3 currently-her dry weight at last office visit was 66.7, BMI on admission was 68. I feel this component of excess fluid given slight lower extremity edema in addition to her respiratory symptoms. We will increase her Demadex to 40 twice a day from 20 twice a day which is her home dose. Continue Aldactone.Continue other supportive management with  BiPAP at night using her BiPAP machine-she will need instruction as to what her settings are as an outpatient as she does not no what her actual settings arew  2. Acute exacerbation of CHF-see above discussion under #1-fluid restrict 1200 cc as well, daily weights, strict I/O's as possible as she is incontinent 3. Acute exacerbation COPD-taper Solu-Medrol from 80 IV every 6--prednisone 60 daily, continue levofloxacin 750 every 24  COPD flare with sputum, continue  Pulmicort 0.25 twice a day, albuterol every 2 when necessary nebs, DuoNeb scheduled every 6 hourly.  Will add Brovana as an outpatient as per her pulmonologist discretion-she tells me she has inhalers at home which were given to her at [facility] urgent care but she never received instruction as to how to use them so we will ask respiratory to teach her how to use a spacer 4. Diabetes mellitusType II-we will continue her sliding scale coverage with sensitive coverage as well as NovoLog Mix 70/30 30 twice a day-she can also continue her Q Ted 1.8 subcutaneous  daily-CBGs are stable in the range of 221-257 today and I expect that her blood sugar will drop once we transition her off of IV steroids 5. Hepatic mass? Hemangioma-needs close follow-up as an outpatient 6. Morbid obesity, Body mass index is 66.92 kg/(m^2). -life-threatening. She has made good weight loss efforts in the past and is determined to try again.    transfer to telemetry, daily weights, ambulate, strict I's and O's as possible, labs in the morning,  Expect diuresis will help with oxygen requirement and we can attempt to see how she does in the next 24-48 hours-I will let her pulmonologist know we will need a close follow-up as an outpatient   Verneita Griffes, MD  [facility] Pager [phone] 08/20/2015, 9:41 AM    LOS: 1 day

## 2015-08-21 LAB — CBC
HEMATOCRIT: 34.5 % — AB (ref 36.0–46.0)
HEMOGLOBIN: 10.8 g/dL — AB (ref 12.0–15.0)
MCH: 27.6 pg (ref 26.0–34.0)
MCHC: 31.3 g/dL (ref 30.0–36.0)
MCV: 88 fL (ref 78.0–100.0)
Platelets: 351 10*3/uL (ref 150–400)
RBC: 3.92 MIL/uL (ref 3.87–5.11)
RDW: 17 % — ABNORMAL HIGH (ref 11.5–15.5)
WBC: 12.6 10*3/uL — ABNORMAL HIGH (ref 4.0–10.5)

## 2015-08-21 LAB — COMPREHENSIVE METABOLIC PANEL
ALT: 31 U/L (ref 14–54)
ANION GAP: 8 (ref 5–15)
AST: 24 U/L (ref 15–41)
Albumin: 2.8 g/dL — ABNORMAL LOW (ref 3.5–5.0)
Alkaline Phosphatase: 92 U/L (ref 38–126)
BUN: 29 mg/dL — ABNORMAL HIGH (ref 6–20)
CHLORIDE: 93 mmol/L — AB (ref 101–111)
CO2: 34 mmol/L — AB (ref 22–32)
Calcium: 9.2 mg/dL (ref 8.9–10.3)
Creatinine, Ser: 0.84 mg/dL (ref 0.44–1.00)
GFR calc non Af Amer: >60 mL/min (ref >60)
Glucose, Bld: 151 mg/dL — ABNORMAL HIGH (ref 65–99)
Potassium: 4.2 mmol/L (ref 3.5–5.1)
SODIUM: 135 mmol/L (ref 135–145)
Total Bilirubin: 0.6 mg/dL (ref 0.3–1.2)
Total Protein: 7 g/dL (ref 6.5–8.1)

## 2015-08-21 LAB — PROTIME-INR
INR: 1.23 (ref 0.00–1.49)
Prothrombin Time: 15.7 seconds — ABNORMAL HIGH (ref 11.6–15.2)

## 2015-08-21 LAB — GLUCOSE, CAPILLARY
GLUCOSE-CAPILLARY: 159 mg/dL — AB (ref 65–99)
GLUCOSE-CAPILLARY: 178 mg/dL — AB (ref 65–99)
Glucose-Capillary: 155 mg/dL — ABNORMAL HIGH (ref 65–99)
Glucose-Capillary: 172 mg/dL — ABNORMAL HIGH (ref 65–99)

## 2015-08-21 MED ORDER — ALBUTEROL SULFATE (2.5 MG/3ML) 0.083% IN NEBU: 2.5000 mg | INHALATION_SOLUTION | RESPIRATORY_TRACT | Status: DC | PRN

## 2015-08-21 MED ORDER — AEROCHAMBER PLUS FLO-VU LARGE MISC
1.0000 | Freq: Once | Status: DC
  Filled 2015-08-21: qty 1

## 2015-08-21 MED ORDER — METOLAZONE 5 MG PO TABS
2.5000 mg | ORAL_TABLET | Freq: Every day | ORAL | Status: DC
  Administered 2015-08-21 – 2015-08-23 (×3): 2.5 mg via ORAL
  Filled 2015-08-21 (×3): qty 1

## 2015-08-21 MED ORDER — ALBUTEROL SULFATE (2.5 MG/3ML) 0.083% IN NEBU
2.5000 mg | INHALATION_SOLUTION | Freq: Four times a day (QID) | RESPIRATORY_TRACT | Status: DC
  Administered 2015-08-22 – 2015-08-25 (×13): 2.5 mg via RESPIRATORY_TRACT
  Filled 2015-08-21 (×13): qty 3

## 2015-08-21 NOTE — Progress Notes (Signed)
[name] [id number] DOB: 1940-10-13 DOA: 08/19/2015 PCP: Maximino Greenland, MD  Brief narrative:  75 year old female Body mass index is 68.73 kg/(m^2). 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 Elsworth Soho PASP 56 mm hg, EF 50-55%, grd 1 DD-Cath 11/2009=normal coronaries Bipolar Hepatic mass? hemangioma Known history of COPD stage  Admitted from [facility] emergency room early 08/19/15 with shortness of breath. 5 wk h/o nasal stuffiness Saw Dr. Tonita Phoenix at [facility] PCP -eventually Rx patient with doxycycline q12 Had been on this for ~ 2 days Didn't feel better and couldn't breathe  Received x2 breathing Rx in EMS- by paramedics 3/29 Nurse came to assess her on 3/30--couldn't bathe herself [her norm]  Doesn't use oxygen at baseline   Has been having productive sputum as well and feeling poorly over the course of 3/29 and 3/30. ABG on admission showed pH 7.34, PCO2 56, PaO2 79-repeat ABG showed pH 7.34 CO2 55 and PaO2 85. She was placed on BiPAP with an FiO2 of 45 and admitted to step down unit.  Sodium 132 chloride 96 BUN/creatinine 20/0.840 care troponin 0.06 with Atrovent to 0.07, proBNP 262. Chest x-ray = cardiomegaly with no acute consolidation or other findings.   Antibiotics:  levaquin   Subjective   Alert Eating and drinking Not foll fluid restriction per RN Does not feel particularly swollen lower extremities   Objective    Interim History:   Telemetry: sinus   Objective: Filed Vitals:   08/21/15 0500 08/21/15 0848 08/21/15 1201 08/21/15 1456  BP: 119/56   135/76  Pulse: 69   88  Temp: 98.1 F (36.7 C)   98.2 F (36.8 C)  TempSrc: Oral   Oral  Resp: 22     Height:      Weight:      SpO2: 100% 98% 97% 90%    Intake/Output Summary (Last 24 hours) at 08/21/15 1755 Last data filed at 08/21/15 1131  Gross per 24 hour  Intake    750 ml  Output      0 ml  Net    750 ml    Exam:  General: eomi  ncat Morbid obesity, Body mass index is 67.6 kg/(m^2). Cardiovascular: s1 s2 no m/r/g Respiratory: clear no added sound Abdomen:  Soft obese nt nd Skin no overt swelling, but trace edema noted NIntact moving all 4 limbs equally  Data Reviewed: Basic Metabolic Panel:  Recent Labs Lab 08/19/15 0123 08/19/15 0506 08/20/15 0343 08/21/15 0607  NA 132* 132* 134* 135  K 4.3 4.8 4.7 4.2  CL 96* 96* 92* 93*  CO2 25 26 32 34*  GLUCOSE 170* 168* 257* 151*  BUN 20 20 23* 29*  CREATININE 0.77 0.80 0.83 0.84  CALCIUM 9.0 9.1 9.2 9.2   Liver Function Tests:  Recent Labs Lab 08/19/15 0506 08/21/15 0607  AST 26 24  ALT 37 31  ALKPHOS 120 92  BILITOT 0.7 0.6  PROT 7.5 7.0  ALBUMIN 2.9* 2.8*   No results for input(s): LIPASE, AMYLASE in the last 168 hours. No results for input(s): AMMONIA in the last 168 hours. CBC:  Recent Labs Lab 08/19/15 0123 08/19/15 0506 08/20/15 0343 08/21/15 0607  WBC 13.6* 12.8* 9.0 12.6*  NEUTROABS 10.8* 12.0* 7.9*  --   HGB 12.0 12.0 10.8* 10.8*  HCT 37.7 37.2 35.0* 34.5*  MCV 86.1 87.3 87.1 88.0  PLT 396 390 379 351   Cardiac Enzymes:  Recent Labs Lab 08/19/15 0506 08/19/15 0936 08/19/15  1630  TROPONINI 0.06* 0.07* 0.04*   BNP: Invalid input(s): POCBNP CBG:  Recent Labs Lab 08/20/15 1610 08/20/15 2209 08/21/15 0736 08/21/15 1131 08/21/15 1642  GLUCAP 305* 205* 155* 159* 178*    Recent Results (from the past 240 hour(s))  MRSA PCR Screening     Status: None   Collection Time: 08/19/15  4:00 AM  Result Value Ref Range Status   MRSA by PCR NEGATIVE NEGATIVE Final    Comment:        The GeneXpert MRSA Assay (FDA approved for NASAL specimens only), is one component of a comprehensive MRSA colonization surveillance program. It is not intended to diagnose MRSA infection nor to guide or monitor treatment for MRSA infections.      Studies:              All Imaging reviewed and is as per above notation   Scheduled  Meds: . AEROCHAMBER PLUS FLO-VU LARGE  1 each Other Once  . albuterol  2.5 mg Inhalation Q4H  . antiseptic oral rinse  7 mL Mouth Rinse q12n4p  . aspirin EC  81 mg Oral Daily  . atorvastatin  20 mg Oral QODAY  . budesonide (PULMICORT) nebulizer solution  0.25 mg Nebulization BID  . chlorhexidine  15 mL Mouth Rinse BID  . enoxaparin (LOVENOX) injection  80 mg Subcutaneous Daily  . insulin aspart  0-9 Units Subcutaneous TID WC  . insulin aspart protamine- aspart  30 Units Subcutaneous BID WC  . levofloxacin (LEVAQUIN) IV  750 mg Intravenous Q24H  . levothyroxine  50 mcg Oral QAC breakfast  . Liraglutide  1.8 mg Subcutaneous Daily  . metolazone  2.5 mg Oral Daily  . multivitamin with minerals  0.5 tablet Oral Daily  . potassium chloride SA  20 mEq Oral Daily  . predniSONE  60 mg Oral Q breakfast  . sertraline  100 mg Oral QHS  . spironolactone  25 mg Oral Daily  . torsemide  40 mg Oral BID  . vitamin B-12  2,500 mcg Oral Daily   Continuous Infusions:    Assessment/Plan:  1. Multifactorial dyspnea-CHF + COPD + OSA + restrictive component-MRC grade 2 to 3 currently-her dry weight at last office visit was 66.7, BMI on admission was 68. I feel this component of excess fluid given slight lower extremity edema in addition to her respiratory symptoms. We will increase her Demadex to 40 twice a day from 20 twice a day which is her home dose. Continue Aldactone.  STRICT FLUID RESTRICT 1200 CC.  She is to bring her Home Bi-pap-seal here not good  2. Acute exacerbation of CHF-see above discussion under #1-fluid restrict 1200 cc as well, daily weights, strict I/O's as possible-hold foley--if unable to weigh or get acc I/o-will need foley in am 4/3 3. Acute exacerbation COPD-taper Solu-Medrol from 80 IV every 6--prednisone 60 daily, continue levofloxacin 750 every 24  COPD flare with sputum, continue Pulmicort 0.25 twice a day, albuterol every 2 when necessary nebs, DuoNeb scheduled every 6 hourly.   Will add Brovana as an outpatient as per her pulmonologist discretion-RT to teach how to use spacer 4. Diabetes mellitus Type II-we will continue her sliding scale coverage with sensitive coverage as well as NovoLog Mix 70/30 30 twice a day-she can also continue her Liraglutide 1.8 subcutaneous daily-CBGs are stable in the range of 159-178 today and I expect that her blood sugar will drop once we transition her off of IV steroids 5. Hepatic mass? Hemangioma-needs close  follow-up as an outpatient 6. Morbid obesity, Body mass index is 67.6 kg/(m^2). -life-threatening. She has made good weight loss efforts in the past and is determined to try again.    transfer to telemetry, daily weights, ambulate, strict I's and O's as possible, labs in the morning,  Expect diuresis will help with oxygen requirement and we can attempt to see how she does in the next 24-48 hours -I will let her pulmonologist know we will need a close follow-up as an outpatient   Verneita Griffes, MD  [facility] Pager [phone] 08/21/2015, 5:55 PM    LOS: 2 days

## 2015-08-21 NOTE — Progress Notes (Signed)
Report received via Denice Paradise RN in patient's room using SBAR format, reviewed orders,lab, VS, meds and patient's general condition, assumed care of patient.

## 2015-08-21 NOTE — Progress Notes (Signed)
Pt on home cpap unit.  Rt will monitor.

## 2015-08-22 LAB — COMPREHENSIVE METABOLIC PANEL
ALBUMIN: 2.8 g/dL — AB (ref 3.5–5.0)
ALK PHOS: 92 U/L (ref 38–126)
ALT: 31 U/L (ref 14–54)
ANION GAP: 12 (ref 5–15)
AST: 23 U/L (ref 15–41)
BILIRUBIN TOTAL: 0.7 mg/dL (ref 0.3–1.2)
BUN: 34 mg/dL — AB (ref 6–20)
CALCIUM: 9.2 mg/dL (ref 8.9–10.3)
CO2: 33 mmol/L — AB (ref 22–32)
CREATININE: 0.89 mg/dL (ref 0.44–1.00)
Chloride: 93 mmol/L — ABNORMAL LOW (ref 101–111)
GFR calc Af Amer: >60 mL/min (ref >60)
GFR calc non Af Amer: >60 mL/min (ref >60)
GLUCOSE: 144 mg/dL — AB (ref 65–99)
Potassium: 3.2 mmol/L — ABNORMAL LOW (ref 3.5–5.1)
SODIUM: 138 mmol/L (ref 135–145)
TOTAL PROTEIN: 7.3 g/dL (ref 6.5–8.1)

## 2015-08-22 LAB — CBC WITH DIFFERENTIAL/PLATELET
BASOS ABS: 0 10*3/uL (ref 0.0–0.1)
BASOS PCT: 0 %
EOS ABS: 0 10*3/uL (ref 0.0–0.7)
EOS PCT: 0 %
HCT: 37.4 % (ref 36.0–46.0)
Hemoglobin: 11.8 g/dL — ABNORMAL LOW (ref 12.0–15.0)
Lymphocytes Relative: 16 %
Lymphs Abs: 2.1 10*3/uL (ref 0.7–4.0)
MCH: 27.6 pg (ref 26.0–34.0)
MCHC: 31.6 g/dL (ref 30.0–36.0)
MCV: 87.4 fL (ref 78.0–100.0)
MONO ABS: 1.7 10*3/uL — AB (ref 0.1–1.0)
MONOS PCT: 13 %
Neutro Abs: 9.6 10*3/uL — ABNORMAL HIGH (ref 1.7–7.7)
Neutrophils Relative %: 71 %
PLATELETS: 373 10*3/uL (ref 150–400)
RBC: 4.28 MIL/uL (ref 3.87–5.11)
RDW: 17 % — AB (ref 11.5–15.5)
WBC: 13.5 10*3/uL — ABNORMAL HIGH (ref 4.0–10.5)

## 2015-08-22 LAB — GLUCOSE, CAPILLARY
GLUCOSE-CAPILLARY: 120 mg/dL — AB (ref 65–99)
GLUCOSE-CAPILLARY: 204 mg/dL — AB (ref 65–99)
GLUCOSE-CAPILLARY: 267 mg/dL — AB (ref 65–99)
Glucose-Capillary: 232 mg/dL — ABNORMAL HIGH (ref 65–99)

## 2015-08-22 MED ORDER — PREDNISONE 20 MG PO TABS
20.0000 mg | ORAL_TABLET | Freq: Every day | ORAL | Status: AC
  Administered 2015-08-23 – 2015-08-24 (×2): 20 mg via ORAL
  Filled 2015-08-22 (×2): qty 1

## 2015-08-22 NOTE — Care Management Important Message (Signed)
Important Message  Patient Details  Name: Nina Howell MRN: [id number] Date of Birth: [birth year]   Medicare Important Message Given:  Yes    Nathen May 08/22/2015, 11:27 AM

## 2015-08-22 NOTE — Progress Notes (Signed)
Nina Howell [id number] DOB: 1940-11-17 DOA: 08/19/2015 PCP: Maximino Greenland, MD  Brief narrative:  75 year old female Body mass index is 68.73 kg/(m^2). 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 Elsworth Soho PASP 56 mm hg, EF 50-55%, grd 1 DD-Cath 11/2009=normal coronaries Bipolar Hepatic mass? hemangioma Known history of COPD stage  Admitted from [facility] emergency room early 08/19/15 with shortness of breath. 5 wk h/o nasal stuffiness Saw Dr. Tonita Phoenix at [facility] PCP -eventually Rx patient with doxycycline q12 Had been on this for ~ 2 days Didn't feel better and couldn't breathe  Received x2 breathing Rx in EMS- by paramedics 3/29 Nurse came to assess her on 3/30--couldn't bathe herself [her norm]  Doesn't use oxygen at baseline   Has been having productive sputum as well and feeling poorly over the course of 3/29 and 3/30. ABG on admission showed pH 7.34, PCO2 56, PaO2 79-repeat ABG showed pH 7.34 CO2 55 and PaO2 85. She was placed on BiPAP with an FiO2 of 45 and admitted to step down unit.  Sodium 132 chloride 96 BUN/creatinine 20/0.840 care troponin 0.06 with Atrovent to 0.07, proBNP 262. Chest x-ray = cardiomegaly with no acute consolidation or other findings.   Antibiotics:  levaquin   Subjective   Alert No i/o's as incontinent  Other wise is fine No cp no sob  sats good Eating and drinking    Objective    Interim History:   Telemetry: sinus   Objective: Filed Vitals:   08/22/15 0452 08/22/15 0656 08/22/15 0753 08/22/15 0846  BP: 98/66  118/76   Pulse: 87   89  Temp: 97.8 F (36.6 C)  97.8 F (36.6 C)   TempSrc: Axillary  Oral   Resp:    20  Height:      Weight:  158.3 kg (348 lb 15.8 oz)    SpO2: 90%  94% 96%   No intake or output data in the 24 hours ending 08/22/15 1331  Exam:  General: eomi ncat Morbid obesity, Body mass index is 65.97 kg/(m^2). Cardiovascular: s1 s2 no m/r/g, JVD ~  5cm Respiratory: clear no added sound Abdomen:  Soft obese nt nd Skin no overt swelling, but trace edema noted NIntact moving all 4 limbs equally  Data Reviewed: Basic Metabolic Panel:  Recent Labs Lab 08/19/15 0123 08/19/15 0506 08/20/15 0343 08/21/15 0607 08/22/15 0525  NA 132* 132* 134* 135 138  K 4.3 4.8 4.7 4.2 3.2*  CL 96* 96* 92* 93* 93*  CO2 25 26 32 34* 33*  GLUCOSE 170* 168* 257* 151* 144*  BUN 20 20 23* 29* 34*  CREATININE 0.77 0.80 0.83 0.84 0.89  CALCIUM 9.0 9.1 9.2 9.2 9.2   Liver Function Tests:  Recent Labs Lab 08/19/15 0506 08/21/15 0607 08/22/15 0525  AST 26 24 23   ALT 37 31 31  ALKPHOS 120 92 92  BILITOT 0.7 0.6 0.7  PROT 7.5 7.0 7.3  ALBUMIN 2.9* 2.8* 2.8*   No results for input(s): LIPASE, AMYLASE in the last 168 hours. No results for input(s): AMMONIA in the last 168 hours. CBC:  Recent Labs Lab 08/19/15 0123 08/19/15 0506 08/20/15 0343 08/21/15 0607 08/22/15 0525  WBC 13.6* 12.8* 9.0 12.6* 13.5*  NEUTROABS 10.8* 12.0* 7.9*  --  9.6*  HGB 12.0 12.0 10.8* 10.8* 11.8*  HCT 37.7 37.2 35.0* 34.5* 37.4  MCV 86.1 87.3 87.1 88.0 87.4  PLT 396 390 379 351 373   Cardiac Enzymes:  Recent Labs Lab  08/19/15 0506 08/19/15 0936 08/19/15 1630  TROPONINI 0.06* 0.07* 0.04*   BNP: Invalid input(s): POCBNP CBG:  Recent Labs Lab 08/21/15 1131 08/21/15 1642 08/21/15 2120 08/22/15 0816 08/22/15 1116  GLUCAP 159* 178* 172* 120* 204*    Recent Results (from the past 240 hour(s))  MRSA PCR Screening     Status: None   Collection Time: 08/19/15  4:00 AM  Result Value Ref Range Status   MRSA by PCR NEGATIVE NEGATIVE Final    Comment:        The GeneXpert MRSA Assay (FDA approved for NASAL specimens only), is one component of a comprehensive MRSA colonization surveillance program. It is not intended to diagnose MRSA infection nor to guide or monitor treatment for MRSA infections.      Studies:              All Imaging  reviewed and is as per above notation   Scheduled Meds: . AEROCHAMBER PLUS FLO-VU LARGE  1 each Other Once  . albuterol  2.5 mg Inhalation QID  . antiseptic oral rinse  7 mL Mouth Rinse q12n4p  . aspirin EC  81 mg Oral Daily  . atorvastatin  20 mg Oral QODAY  . budesonide (PULMICORT) nebulizer solution  0.25 mg Nebulization BID  . chlorhexidine  15 mL Mouth Rinse BID  . insulin aspart  0-9 Units Subcutaneous TID WC  . insulin aspart protamine- aspart  30 Units Subcutaneous BID WC  . levofloxacin (LEVAQUIN) IV  750 mg Intravenous Q24H  . levothyroxine  50 mcg Oral QAC breakfast  . Liraglutide  1.8 mg Subcutaneous Daily  . metolazone  2.5 mg Oral Daily  . multivitamin with minerals  0.5 tablet Oral Daily  . potassium chloride SA  20 mEq Oral Daily  . predniSONE  60 mg Oral Q breakfast  . sertraline  100 mg Oral QHS  . spironolactone  25 mg Oral Daily  . torsemide  40 mg Oral BID  . vitamin B-12  2,500 mcg Oral Daily   Continuous Infusions:    Assessment/Plan:  1. Multifactorial dyspnea-CHF + COPD + OSA + restrictive component-MRC grade 2 to 3 currently-her dry weight at last office visit was 66.7, BMI on admission was 68. I feel this component of excess fluid given slight lower extremity edema in addition to her respiratory symptoms. We will increase her Demadex to 40 twice a day from 20 twice a day which is her home dose. Added zaroxyln 2.5 qd on 4/2.  Continue Aldactone.  STRICT FLUID RESTRICT 1200 CC-place foley   2. OSA-She is to bring her Home Bi-pap-seal here not good  3. Acute exacerbation of CHF-see above discussion under #1-fluid restrict 1200 cc as well, daily weights, strict I/O's as possible-hold foley---need foley in am 4/3 4. Acute exacerbation COPD-taper Solu-Medrol from 80 IV every 6--prednisone 60 daily--->20 daily, continue levofloxacin 750 every 24--as no fever no chills, d/c levaquin 08/22/15 5.   COPD flare with sputum, continue Pulmicort 0.25 twice a day, albuterol  every 2 when necessary nebs, DuoNeb scheduled every 6 hourly.  Will add Brovana as an outpatient as per her pulmonologist discretion-RT to teach how to use spacer 6. Diabetes mellitus Type II-we will continue her sliding scale coverage with sensitive coverage as well as NovoLog Mix 70/30 30 twice a day-she can also continue her Liraglutide 1.8 subcutaneous daily-CBGs are stable in the range of 120-204 today  7. Hepatic mass? Hemangioma-needs close follow-up as an outpatient 8. Morbid obesity, Body mass  index is 65.97 kg/(m^2). -life-threatening. She has made good weight loss efforts in the past and is determined to try again.   daily weights, ambulate, strict I's and O's as possible, labs in the morning,  Expect diuresis will help with oxygen requirement and we can attempt to see how she does in the next 24-48 hours -I will let her pulmonologist know we will need a close follow-up as an outpatient   Verneita Griffes, MD  [facility] Pager [phone] 08/22/2015, 1:31 PM    LOS: 3 days

## 2015-08-23 LAB — GLUCOSE, CAPILLARY
GLUCOSE-CAPILLARY: 110 mg/dL — AB (ref 65–99)
GLUCOSE-CAPILLARY: 263 mg/dL — AB (ref 65–99)
Glucose-Capillary: 108 mg/dL — ABNORMAL HIGH (ref 65–99)
Glucose-Capillary: 261 mg/dL — ABNORMAL HIGH (ref 65–99)

## 2015-08-23 MED ORDER — TORSEMIDE 20 MG PO TABS
60.0000 mg | ORAL_TABLET | Freq: Two times a day (BID) | ORAL | Status: DC
  Administered 2015-08-23 – 2015-08-26 (×7): 60 mg via ORAL
  Filled 2015-08-23 (×7): qty 3

## 2015-08-23 NOTE — Telephone Encounter (Signed)
Spoke to pt

## 2015-08-23 NOTE — Progress Notes (Signed)
Pt is on her home unit NIV for the night no distress noted.

## 2015-08-23 NOTE — Progress Notes (Signed)
Nina Howell [id number] DOB: 07-26-1940 DOA: 08/19/2015 PCP: Maximino Greenland, MD  Brief narrative:  75 year old female Body mass index is 68.73 kg/(m^2). 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 Elsworth Soho PASP 56 mm hg, EF 50-55%, grd 1 DD-Cath 11/2009=normal coronaries Bipolar Hepatic mass? hemangioma Known history of COPD stage  Admitted from [facility] emergency room early 08/19/15 with shortness of breath. 5 wk h/o nasal stuffiness Saw Dr. Tonita Phoenix at [facility] PCP -eventually Rx patient with doxycycline q12 Had been on this for ~ 2 days Didn't feel better and couldn't breathe  Received x2 breathing Rx in EMS- by paramedics 3/29 Nurse came to assess her on 3/30--couldn't bathe herself [her norm]  Doesn't use oxygen at baseline   Has been having productive sputum as well and feeling poorly over the course of 3/29 and 3/30. ABG on admission showed pH 7.34, PCO2 56, PaO2 79-repeat ABG showed pH 7.34 CO2 55 and PaO2 85. She was placed on BiPAP with an FiO2 of 45 and admitted to step down unit.  Sodium 132 chloride 96 BUN/creatinine 20/0.840 care troponin 0.06 with Atrovent to 0.07, proBNP 262. Chest x-ray = cardiomegaly with no acute consolidation or other findings.   Antibiotics:  levaquin   Subjective   Doing fair.  Is using good amount of urine Foley in place Patient is ambulatory at home No chest pain No wheezing Tolerating diet No blurred vision No double vision    Objective    Interim History:   Telemetry: sinus   Objective: Filed Vitals:   08/23/15 0500 08/23/15 0805 08/23/15 1125 08/23/15 1321  BP: 112/74   86/64  Pulse: 83   100  Temp: 98 F (36.7 C)   98.4 F (36.9 C)  TempSrc: Oral   Oral  Resp:    21  Height:      Weight: 152.318 kg (335 lb 12.8 oz)     SpO2: 97% 97% 97% 91%    Intake/Output Summary (Last 24 hours) at 08/23/15 1405 Last data filed at 08/23/15 1300  Gross per 24 hour    Intake    400 ml  Output   4300 ml  Net  -3900 ml    Exam:  General: eomi ncat Morbid obesity, Body mass index is 63.48 kg/(m^2). Cardiovascular: s1 s2 no m/r/g, JVD ~ 3cm Respiratory: clear no added sound Abdomen:  Soft obese nt nd--Abdomen does not seem is distended or swollen Skin no overt swelling- NIntact moving all 4 limbs equally  Data Reviewed: Basic Metabolic Panel:  Recent Labs Lab 08/19/15 0123 08/19/15 0506 08/20/15 0343 08/21/15 0607 08/22/15 0525  NA 132* 132* 134* 135 138  K 4.3 4.8 4.7 4.2 3.2*  CL 96* 96* 92* 93* 93*  CO2 25 26 32 34* 33*  GLUCOSE 170* 168* 257* 151* 144*  BUN 20 20 23* 29* 34*  CREATININE 0.77 0.80 0.83 0.84 0.89  CALCIUM 9.0 9.1 9.2 9.2 9.2   Liver Function Tests:  Recent Labs Lab 08/19/15 0506 08/21/15 0607 08/22/15 0525  AST 26 24 23   ALT 37 31 31  ALKPHOS 120 92 92  BILITOT 0.7 0.6 0.7  PROT 7.5 7.0 7.3  ALBUMIN 2.9* 2.8* 2.8*   No results for input(s): LIPASE, AMYLASE in the last 168 hours. No results for input(s): AMMONIA in the last 168 hours. CBC:  Recent Labs Lab 08/19/15 0123 08/19/15 0506 08/20/15 0343 08/21/15 0607 08/22/15 0525  WBC 13.6* 12.8* 9.0 12.6* 13.5*  NEUTROABS 10.8*  12.0* 7.9*  --  9.6*  HGB 12.0 12.0 10.8* 10.8* 11.8*  HCT 37.7 37.2 35.0* 34.5* 37.4  MCV 86.1 87.3 87.1 88.0 87.4  PLT 396 390 379 351 373   Cardiac Enzymes:  Recent Labs Lab 08/19/15 0506 08/19/15 0936 08/19/15 1630  TROPONINI 0.06* 0.07* 0.04*   BNP: Invalid input(s): POCBNP CBG:  Recent Labs Lab 08/22/15 1116 08/22/15 1618 08/22/15 2115 08/23/15 0747 08/23/15 1121  GLUCAP 204* 232* 267* 108* 110*    Recent Results (from the past 240 hour(s))  MRSA PCR Screening     Status: None   Collection Time: 08/19/15  4:00 AM  Result Value Ref Range Status   MRSA by PCR NEGATIVE NEGATIVE Final    Comment:        The GeneXpert MRSA Assay (FDA approved for NASAL specimens only), is one component of  a comprehensive MRSA colonization surveillance program. It is not intended to diagnose MRSA infection nor to guide or monitor treatment for MRSA infections.      Studies:              All Imaging reviewed and is as per above notation   Scheduled Meds: . AEROCHAMBER PLUS FLO-VU LARGE  1 each Other Once  . albuterol  2.5 mg Inhalation QID  . antiseptic oral rinse  7 mL Mouth Rinse q12n4p  . aspirin EC  81 mg Oral Daily  . atorvastatin  20 mg Oral QODAY  . budesonide (PULMICORT) nebulizer solution  0.25 mg Nebulization BID  . chlorhexidine  15 mL Mouth Rinse BID  . insulin aspart  0-9 Units Subcutaneous TID WC  . insulin aspart protamine- aspart  30 Units Subcutaneous BID WC  . levothyroxine  50 mcg Oral QAC breakfast  . Liraglutide  1.8 mg Subcutaneous Daily  . multivitamin with minerals  0.5 tablet Oral Daily  . potassium chloride SA  20 mEq Oral Daily  . predniSONE  20 mg Oral Q breakfast  . sertraline  100 mg Oral QHS  . spironolactone  25 mg Oral Daily  . torsemide  60 mg Oral BID  . vitamin B-12  2,500 mcg Oral Daily   Continuous Infusions:    Assessment/Plan:  1. Multifactorial dyspnea-CHF + COPD + OSA + restrictive component-MRC grade 2 to 3 currently-her dry weight at last office visit was 66.7, BMI on admission was 68. I feel this component of excess fluid given slight lower extremity edema in addition to her respiratory symptoms. We will increase  20 mg-->her Demadex to 40-->60 08/23/15 we will hold off on escalation of  zaroxyln 2.5 qd Continue Aldactone.  STRICT FLUID RESTRICT 1200 CC-place foley --- Foley to be removed on 08/24/2015 at 1600 to prevent catheter associated UTI. Ins and outs are nearing euvolemic however may still have ways to go. Body mass index is 63.48 kg/(m^2). Down from baseline BMI of 66.  2. OSA-She is to bring her Home Bi-pap-tol discontinue prednisone on/5/17. Appreciate Dr. Elsworth Soho input and calling the patient. erating.  Patient needs desaturation  screen to determine if she needs oxygen with ambulation-nursing is aware.   3. Acute exacerbation of CHF-see above discussion under #1-fluid restrict 1200 cc as well, daily weights, strict I/O's  4. Acute exacerbation COPD-taper Solu-Medrol from 80 IV every 6--prednisone 60 daily--->20 daily-stop date 08/24/2015, continue levofloxacin 750 every 24--as no fever no chills, d/c levaquin 08/22/15-- appreciate pulmonologist input.  5.   COPD flare with sputum, continue Pulmicort 0.25 twice a day, albuterol every 2  when necessary nebs, DuoNeb scheduled every 6 hourly.  Will add Brovana as an outpatient as per her pulmonologist discretion-RT to teach how to use spacer. 6. Diabetes mellitus Type II-we will continue her sliding scale coverage with sensitive coverage as well as NovoLog Mix 70/30 30 twice a day-she can also continue her Liraglutide 1.8 subcutaneous daily-CBGs are stable in the range of 108-110 today  7. Hepatic mass? Hemangioma-needs close follow-up as an outpatient 8. Morbid obesity, Body mass index is 63.48 kg/(m^2). -life-threatening. She has made good weight loss efforts in the past and is determined to try again.   daily weights, ambulate, strict I's and O's as possible, labs in the morning.   Expect diuresis will help with oxygen requirement and we can attempt to see how she does in the next 24-48 hours  Verneita Griffes, MD  [facility] Pager [phone] 08/23/2015, 2:05 PM    LOS: 4 days

## 2015-08-24 DIAGNOSIS — I5033 Acute on chronic diastolic (congestive) heart failure: Secondary | ICD-10-CM

## 2015-08-24 DIAGNOSIS — G4733 Obstructive sleep apnea (adult) (pediatric): Secondary | ICD-10-CM

## 2015-08-24 LAB — CBC WITH DIFFERENTIAL/PLATELET
Basophils Absolute: 0 10*3/uL (ref 0.0–0.1)
Basophils Relative: 0 %
EOS ABS: 0.2 10*3/uL (ref 0.0–0.7)
Eosinophils Relative: 1 %
HEMATOCRIT: 40.9 % (ref 36.0–46.0)
HEMOGLOBIN: 13 g/dL (ref 12.0–15.0)
LYMPHS ABS: 1.7 10*3/uL (ref 0.7–4.0)
LYMPHS PCT: 11 %
MCH: 28.2 pg (ref 26.0–34.0)
MCHC: 31.8 g/dL (ref 30.0–36.0)
MCV: 88.7 fL (ref 78.0–100.0)
MONOS PCT: 13 %
Monocytes Absolute: 1.9 10*3/uL — ABNORMAL HIGH (ref 0.1–1.0)
NEUTROS PCT: 75 %
Neutro Abs: 11.5 10*3/uL — ABNORMAL HIGH (ref 1.7–7.7)
Platelets: 350 10*3/uL (ref 150–400)
RBC: 4.61 MIL/uL (ref 3.87–5.11)
RDW: 16.7 % — ABNORMAL HIGH (ref 11.5–15.5)
WBC: 15.3 10*3/uL — ABNORMAL HIGH (ref 4.0–10.5)

## 2015-08-24 LAB — GLUCOSE, CAPILLARY
GLUCOSE-CAPILLARY: 273 mg/dL — AB (ref 65–99)
Glucose-Capillary: 157 mg/dL — ABNORMAL HIGH (ref 65–99)
Glucose-Capillary: 164 mg/dL — ABNORMAL HIGH (ref 65–99)
Glucose-Capillary: 232 mg/dL — ABNORMAL HIGH (ref 65–99)

## 2015-08-24 LAB — URINALYSIS, ROUTINE W REFLEX MICROSCOPIC
Bilirubin Urine: NEGATIVE
Glucose, UA: NEGATIVE mg/dL
Hgb urine dipstick: NEGATIVE
Ketones, ur: NEGATIVE mg/dL
LEUKOCYTES UA: NEGATIVE
Nitrite: NEGATIVE
Protein, ur: NEGATIVE mg/dL
SPECIFIC GRAVITY, URINE: 1.01 (ref 1.005–1.030)
pH: 7 (ref 5.0–8.0)

## 2015-08-24 LAB — COMPREHENSIVE METABOLIC PANEL
ALT: 31 U/L (ref 14–54)
AST: 19 U/L (ref 15–41)
Albumin: 2.9 g/dL — ABNORMAL LOW (ref 3.5–5.0)
Alkaline Phosphatase: 90 U/L (ref 38–126)
Anion gap: 13 (ref 5–15)
BUN: 41 mg/dL — ABNORMAL HIGH (ref 6–20)
CHLORIDE: 82 mmol/L — AB (ref 101–111)
CO2: 46 mmol/L — AB (ref 22–32)
CREATININE: 0.96 mg/dL (ref 0.44–1.00)
Calcium: 9.4 mg/dL (ref 8.9–10.3)
GFR calc non Af Amer: 57 mL/min — ABNORMAL LOW (ref >60)
Glucose, Bld: 159 mg/dL — ABNORMAL HIGH (ref 65–99)
Potassium: 3.2 mmol/L — ABNORMAL LOW (ref 3.5–5.1)
SODIUM: 141 mmol/L (ref 135–145)
Total Bilirubin: 1 mg/dL (ref 0.3–1.2)
Total Protein: 7.8 g/dL (ref 6.5–8.1)

## 2015-08-24 MED ORDER — POTASSIUM CHLORIDE CRYS ER 20 MEQ PO TBCR
40.0000 meq | EXTENDED_RELEASE_TABLET | Freq: Two times a day (BID) | ORAL | Status: DC
  Administered 2015-08-24: 40 meq via ORAL
  Filled 2015-08-24 (×2): qty 2

## 2015-08-24 MED ORDER — ENOXAPARIN SODIUM 40 MG/0.4ML ~~LOC~~ SOLN
40.0000 mg | SUBCUTANEOUS | Status: DC
  Administered 2015-08-24: 40 mg via SUBCUTANEOUS
  Filled 2015-08-24: qty 0.4

## 2015-08-24 NOTE — Care Management Note (Addendum)
Case Management Note  Patient Details  Name: Nina Howell MRN: [id number] Date of Birth: 01-26-1941  Subjective/Objective:    Pt admitted for Acute Respiratory Failure with Hypoxia. Pt is from home with husband. Active with [facility] for RN, PT and will need Aide. Pt has DME: CPAP, Cane and RW. Pt may need 02 for home. RN to ambulate to see if qualifies for home 02.                Action/Plan:  CM did make Mary with [place] aware. No further needs at this time. Will continue to f/u.   Expected Discharge Date:                  Expected Discharge Plan:  [place]  In-House Referral:  NA  Discharge planning Services  CM Consult  Post Acute Care Choice:  Durable Medical Equipment, Home Health Choice offered to:  Patient  DME Arranged:  Oxygen DME Agency:   [facility]  HH Arranged:  RN, PT, Nurse's Aide HH Agency:  [facility]  Status of Service:  Completed, signed off  Medicare Important Message Given:  Yes Date Medicare IM Given:    Medicare IM give by:    Date Additional Medicare IM Given:    Additional Medicare Important Message give by:     If discussed at [place] of Stay Meetings, dates discussed:    Additional Comments:1506 08-25-15 Jacqlyn Krauss, RN, BSN [phone] Pt has DME via [facility]. Pt wants to use them for 02 as well. Referral sent to [facility] for DME. Plan for pt to d/c home on Friday. No further needs from CM at this time.   Bethena Roys, RN 08/24/2015, 11:10 AM

## 2015-08-24 NOTE — Plan of Care (Signed)
Problem: Pain Managment: Goal: General experience of comfort will improve Outcome: Completed/Met Date Met:  08/24/15 Patient is able to describe if she has pain, where it is, the quantity, if it radiates and what she needs to relieve it.

## 2015-08-24 NOTE — Plan of Care (Signed)
Problem: Education: Goal: Knowledge of [place] [facility] Education information/materials will improve Outcome: Completed/Met Date Met:  08/24/15 Patient is aware and has been provided with information and reviewed pain, meds and safety and patient verbalized understanding. Patient is quite aware of her disease process and will tell you all about it. Patient has her personal belongings within reach on her bedside stand and knows how to call RN/NT if she needs assistance. Bed alarm on at night and patient is okay with that to help remind her not to get OOB without calling for someone.

## 2015-08-24 NOTE — Progress Notes (Signed)
Sat pt up on the side of the bed and her O2 sats went from 98% RA to 79% RA.  Put 2L [place] on pt and she stated she wanted to lay back down and not walk.  Will continue to closely monitor.

## 2015-08-24 NOTE — Progress Notes (Signed)
Report received via Kelsey RN in patient's room using SBAR format, reviewed orders, labs, VS, meds and patient's general condition, assumed care of patient. 

## 2015-08-24 NOTE — Plan of Care (Signed)
Problem: Safety: Goal: Ability to remain free from injury will improve Outcome: Completed/Met Date Met:  08/24/15 Patient uses the [facility] and her cane as needed and she uses her call light appropriately.

## 2015-08-24 NOTE — Progress Notes (Signed)
PATIENT DETAILS Name: Nina Howell Age: 75 y.o. Sex: female Date of Birth: Dec 29, 1940 Admit Date: 08/19/2015 Admitting Physician Rise Patience, MD [id number] N, MD  Subjective: Feels much better. Foley catheter removed earlier this morning.  Assessment/Plan: Principal Problem: Acute respiratory failure with hypoxia and hypercarbia: Multifactorial etiology, secondary to acute diastolic heart failure, COPD exacerbation and OSA/OSH. Basically improved with diuretics, bronchodilators, BiPAP. Suspect will require oxygen on discharge.  Active Problems: Acute diastolic heart failure: Significantly more compensated, -4.8 L so far, weight decreased to 338 pounds (363 pounds on admission). Do 60 mg of Demadex twice a day along with Aldactone. Obtain updated 2-D echocardiogram.  COPD exacerbation: Much improved, lungs clear on exam without any rhonchi. Completed course of Levaquin, steroids being tapered down. Continue bronchodilators as ordered.  Obstructive sleep apnea: Continue BiPAP.  Type 2 diabetes: CBGs stable, continue insulin 70/3030 units twice a day along with SSI.  Dyslipidemia: Continue statin  Hypothyroidism: Continue with levothyroxine.  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  Morbid obesity: Counseled regarding importance of weight loss  Disposition: Remain inpatient-home 4/6  Antimicrobial agents  See below  Anti-infectives    Start     Dose/Rate Route Frequency Ordered Stop   08/19/15 0500  levofloxacin (LEVAQUIN) IVPB 750 mg  Status:  Discontinued     750 mg 100 mL/hr over 90 Minutes Intravenous Every 24 hours 08/19/15 0447 08/22/15 1400      DVT Prophylaxis: Prophylactic Lovenox   Code Status: Full code   Family Communication None at bedside  Procedures: None  CONSULTS:  None  Time spent 30 minutes-Greater than 50% of this time was spent in counseling, explanation of diagnosis, planning of further  management, and coordination of care.  MEDICATIONS: Scheduled Meds: . AEROCHAMBER PLUS FLO-VU LARGE  1 each Other Once  . albuterol  2.5 mg Inhalation QID  . antiseptic oral rinse  7 mL Mouth Rinse q12n4p  . aspirin EC  81 mg Oral Daily  . atorvastatin  20 mg Oral QODAY  . budesonide (PULMICORT) nebulizer solution  0.25 mg Nebulization BID  . chlorhexidine  15 mL Mouth Rinse BID  . insulin aspart  0-9 Units Subcutaneous TID WC  . insulin aspart protamine- aspart  30 Units Subcutaneous BID WC  . levothyroxine  50 mcg Oral QAC breakfast  . Liraglutide  1.8 mg Subcutaneous Daily  . multivitamin with minerals  0.5 tablet Oral Daily  . potassium chloride SA  40 mEq Oral BID  . sertraline  100 mg Oral QHS  . spironolactone  25 mg Oral Daily  . torsemide  60 mg Oral BID  . vitamin B-12  2,500 mcg Oral Daily   Continuous Infusions:  PRN Meds:.acetaminophen **OR** acetaminophen, albuterol, ondansetron **OR** ondansetron (ZOFRAN) IV    PHYSICAL EXAM: Vital signs in last 24 hours: Filed Vitals:   08/24/15 0404 08/24/15 0737 08/24/15 0745 08/24/15 1428  BP: 105/68   110/78  Pulse: 80   80  Temp: 97.7 F (36.5 C)   98 F (36.7 C)  TempSrc: Axillary   Oral  Resp: 17   18  Height:      Weight: 153.588 kg (338 lb 9.6 oz)     SpO2: 90% 91% 91% 93%    Weight change: 1.27 kg (2 lb 12.8 oz) Filed Weights   08/22/15 0656 08/23/15 0500 08/24/15 0404  Weight: 158.3 kg (348 lb 15.8 oz) 152.318 kg (335 lb  12.8 oz) 153.588 kg (338 lb 9.6 oz)   Body mass index is 64.01 kg/(m^2).   Gen Exam: Awake and alert with clear speech.   Neck: Supple, No JVD.   Chest: B/L Clear.   CVS: S1 S2 Regular, no murmurs.  Abdomen: soft, BS +, non tender, non distended.  Extremities: + edema, lower extremities warm to touch. Neurologic: Non Focal.   Skin: No Rash.   Wounds: N/A.   Intake/Output from previous day:  Intake/Output Summary (Last 24 hours) at 08/24/15 1533 Last data filed at 08/24/15  1300  Gross per 24 hour  Intake   1140 ml  Output   3000 ml  Net  -1860 ml     LAB RESULTS: CBC  Recent Labs Lab 08/19/15 0123 08/19/15 0506 08/20/15 0343 08/21/15 0607 08/22/15 0525 08/24/15 0629  WBC 13.6* 12.8* 9.0 12.6* 13.5* 15.3*  HGB 12.0 12.0 10.8* 10.8* 11.8* 13.0  HCT 37.7 37.2 35.0* 34.5* 37.4 40.9  PLT 396 390 379 351 373 350  MCV 86.1 87.3 87.1 88.0 87.4 88.7  MCH 27.4 28.2 26.9 27.6 27.6 28.2  MCHC 31.8 32.3 30.9 31.3 31.6 31.8  RDW 16.6* 16.7* 16.6* 17.0* 17.0* 16.7*  LYMPHSABS 1.7 0.6* 0.8  --  2.1 1.7  MONOABS 1.1* 0.1 0.3  --  1.7* 1.9*  EOSABS 0.1 0.0 0.0  --  0.0 0.2  BASOSABS 0.0 0.0 0.0  --  0.0 0.0    Chemistries   Recent Labs Lab 08/19/15 0506 08/20/15 0343 08/21/15 0607 08/22/15 0525 08/24/15 0629  NA 132* 134* 135 138 141  K 4.8 4.7 4.2 3.2* 3.2*  CL 96* 92* 93* 93* 82*  CO2 26 32 34* 33* 46*  GLUCOSE 168* 257* 151* 144* 159*  BUN 20 23* 29* 34* 41*  CREATININE 0.80 0.83 0.84 0.89 0.96  CALCIUM 9.1 9.2 9.2 9.2 9.4    CBG:  Recent Labs Lab 08/23/15 1121 08/23/15 1624 08/23/15 2144 08/24/15 0730 08/24/15 1112  GLUCAP 110* 263* 261* 157* 164*    GFR Estimated Creatinine Clearance: 74.2 mL/min (by C-G formula based on Cr of 0.96).  Coagulation profile  Recent Labs Lab 08/21/15 0607  INR 1.23    Cardiac Enzymes  Recent Labs Lab 08/19/15 0506 08/19/15 0936 08/19/15 1630  TROPONINI 0.06* 0.07* 0.04*    Invalid input(s): POCBNP No results for input(s): DDIMER in the last 72 hours. No results for input(s): HGBA1C in the last 72 hours. No results for input(s): CHOL, HDL, LDLCALC, TRIG, CHOLHDL, LDLDIRECT in the last 72 hours. No results for input(s): TSH, T4TOTAL, T3FREE, THYROIDAB in the last 72 hours.  Invalid input(s): FREET3 No results for input(s): VITAMINB12, FOLATE, FERRITIN, TIBC, IRON, RETICCTPCT in the last 72 hours. No results for input(s): LIPASE, AMYLASE in the last 72 hours.  Urine Studies No  results for input(s): UHGB, CRYS in the last 72 hours.  Invalid input(s): UACOL, UAPR, USPG, UPH, UTP, UGL, UKET, UBIL, UNIT, UROB, ULEU, UEPI, UWBC, URBC, UBAC, CAST, UCOM, BILUA  MICROBIOLOGY: Recent Results (from the past 240 hour(s))  MRSA PCR Screening     Status: None   Collection Time: 08/19/15  4:00 AM  Result Value Ref Range Status   MRSA by PCR NEGATIVE NEGATIVE Final    Comment:        The GeneXpert MRSA Assay (FDA approved for NASAL specimens only), is one component of a comprehensive MRSA colonization surveillance program. It is not intended to diagnose MRSA infection nor to guide or monitor  treatment for MRSA infections.     RADIOLOGY STUDIES/RESULTS: Dg Chest Port 1 View  08/19/2015  CLINICAL DATA:  75 year old female with shortness of breath. History of CHF. EXAM: PORTABLE CHEST 1 VIEW COMPARISON:  Chest radiograph dated 04/13/2010 FINDINGS: Single portable view of the chest demonstrate moderate cardiomegaly. There is prominence of the central vasculature and interstitial markings likely mild congestive changes. Bibasilar hazy atelectatic changes noted. Developing pneumonia is not excluded. There is no pneumothorax. The left costophrenic angle has been excluded from the image. Trace right pleural effusion may be present. No acute osseous pathology identified. IMPRESSION: Cardiomegaly with mild congestive changes.  No focal consolidation. Electronically Signed   By: Anner Crete M.D.   On: 08/19/2015 01:28    Oren Binet, MD  [facility] Pager:[phone]  If 7PM-7AM, please contact night-coverage [email or web address] Password [email or web address] 08/24/2015, 3:33 PM   LOS: 5 days

## 2015-08-24 NOTE — Progress Notes (Signed)
Removed pt foley per MD order.  Will continue to closely monitor.

## 2015-08-24 NOTE — Progress Notes (Signed)
Patient has home CPAP machine, tubing and mask. Water chamber was full. Patient placed on CPAP. RT made patient aware that if she needed anything to call.

## 2015-08-24 NOTE — Evaluation (Signed)
Physical Therapy Evaluation Patient Details Name: Nina Howell MRN: [id number] DOB: Oct 01, 1940 Today's Date: 08/24/2015   History of Present Illness  75 y.o. female admitted for 1.  Multifactorial dyspnea-CHF + COPD + OSA + restrictive component-MRC grade 2 to 3, and acute exacerbation of CHF.  Clinical Impression  Pt admitted with above diagnosis. Pt currently with functional limitations due to the deficits listed below (see PT Problem List). SpO2 86% on 2L while ambulating, 94% on 3L, moderately dyspneic, requiring 2 standing rest breaks while ambulating with a walker up to 55 feet today. Independent with a cane PTA, community dwelling. Will benefit from HHPT due to weakness and decline in function strength and endurance. Pt will benefit from skilled PT to increase their independence and safety with mobility to allow discharge to the venue listed below.       Follow Up Recommendations Home health PT;Supervision - Intermittent    Equipment Recommendations  Rolling walker with 5" wheels    Recommendations for Other Services OT consult     Precautions / Restrictions Precautions Precaution Comments: monitor O2 Restrictions Weight Bearing Restrictions: No      Mobility  Bed Mobility Overal bed mobility: Modified Independent             General bed mobility comments: extra time.  Transfers Overall transfer level: Needs assistance Equipment used: Rolling walker (2 wheeled) Transfers: Sit to/from Stand Sit to Stand: Supervision         General transfer comment: supervision for safety. VC for hand placement. Slow to rise but stable with RW for support.  Ambulation/Gait Ambulation/Gait assistance: Supervision Ambulation Distance (Feet): 55 Feet Assistive device: Rolling walker (2 wheeled) Gait Pattern/deviations: Step-through pattern;Decreased stride length;Trunk flexed;Wide base of support Gait velocity: decreased Gait velocity interpretation: Below normal speed  for age/gender General Gait Details: Educated on safe DME use with a rolling walker, VC for technique, walker placement, and upright posture. Required 2 standing rest breaks to complete distance. SpO2 on 2L supplemental O2 86%, on 3L 94%, moderate dyspnea.  Cues for pursed lip breathing  Stairs            Wheelchair Mobility    Modified Rankin (Stroke Patients Only)       Balance                                             Pertinent Vitals/Pain Pain Assessment: No/denies pain    Home Living Family/patient expects to be discharged to:: Private residence Living Arrangements: Spouse/significant other Available Help at Discharge: Family (husband works during the day, fixes her breakfast and lunch) Type of Home: House Home Access: Level entry     Home Layout: Two level;Bed/bath upstairs Home Equipment: Tub bench;Cane - single point;Walker - 4 wheels;Bedside commode      Prior Function Level of Independence: Needs assistance;Independent with assistive device(s)   Gait / Transfers Assistance Needed: uses rollator outside, cane inside, drives.           Hand Dominance   Dominant Hand: Left    Extremity/Trunk Assessment   Upper Extremity Assessment: Defer to OT evaluation           Lower Extremity Assessment: Generalized weakness         Communication   Communication: No difficulties  Cognition Arousal/Alertness: Awake/alert Behavior During Therapy: WFL for tasks assessed/performed Overall Cognitive Status: Within Functional Limits for  tasks assessed                      General Comments General comments (skin integrity, edema, etc.): SpO2 ambulating on 2L 86%, at rest on 2L 91%, and on 3L 94% while ambulating.    Exercises        Assessment/Plan    PT Assessment Patient needs continued PT services  PT Diagnosis Difficulty walking;Generalized weakness;Abnormality of gait   PT Problem List Decreased strength;Decreased  activity tolerance;Decreased balance;Decreased mobility;Decreased knowledge of use of DME;Cardiopulmonary status limiting activity;Obesity  PT Treatment Interventions DME instruction;Gait training;Functional mobility training;Therapeutic activities;Balance training;Therapeutic exercise;Patient/family education   PT Goals (Current goals can be found in the Care Plan section) Acute Rehab PT Goals Patient Stated Goal: Get back to my normal self PT Goal Formulation: With patient Time For Goal Achievement: 09/07/15 Potential to Achieve Goals: Good    Frequency Min 3X/week   Barriers to discharge Decreased caregiver support husband works during the day    Co-evaluation               End of Session Equipment Utilized During Treatment: Gait belt;Oxygen Activity Tolerance: Patient tolerated treatment well Patient left: in chair;with chair alarm set           Time: 1112-1140 PT Time Calculation (min) (ACUTE ONLY): 28 min   Charges:   PT Evaluation $PT Eval Low Complexity: 1 Procedure PT Treatments $Gait Training: 8-22 mins   PT G Codes:        Ellouise Newer 08/24/2015, 11:56 AM  Elayne Snare, [place]

## 2015-08-25 ENCOUNTER — Inpatient Hospital Stay (HOSPITAL_COMMUNITY): Payer: BLUE CROSS/BLUE SHIELD

## 2015-08-25 DIAGNOSIS — I509 Heart failure, unspecified: Secondary | ICD-10-CM

## 2015-08-25 DIAGNOSIS — I10 Essential (primary) hypertension: Secondary | ICD-10-CM

## 2015-08-25 LAB — BASIC METABOLIC PANEL
Anion gap: 15 (ref 5–15)
BUN: 42 mg/dL — AB (ref 6–20)
CALCIUM: 9.5 mg/dL (ref 8.9–10.3)
CO2: 43 mmol/L — ABNORMAL HIGH (ref 22–32)
CREATININE: 0.81 mg/dL (ref 0.44–1.00)
Chloride: 80 mmol/L — ABNORMAL LOW (ref 101–111)
Glucose, Bld: 103 mg/dL — ABNORMAL HIGH (ref 65–99)
Potassium: 2.8 mmol/L — ABNORMAL LOW (ref 3.5–5.1)
SODIUM: 138 mmol/L (ref 135–145)

## 2015-08-25 LAB — GLUCOSE, CAPILLARY
GLUCOSE-CAPILLARY: 146 mg/dL — AB (ref 65–99)
GLUCOSE-CAPILLARY: 117 mg/dL — AB (ref 65–99)
Glucose-Capillary: 179 mg/dL — ABNORMAL HIGH (ref 65–99)
Glucose-Capillary: 133 mg/dL — ABNORMAL HIGH (ref 65–99)

## 2015-08-25 LAB — ECHOCARDIOGRAM COMPLETE
Height: 61 in
WEIGHTICAEL: 5420.8 [oz_av]

## 2015-08-25 MED ORDER — ENOXAPARIN SODIUM 80 MG/0.8ML ~~LOC~~ SOLN
0.5000 mg/kg | SUBCUTANEOUS | Status: DC
  Administered 2015-08-25 – 2015-08-26 (×2): 75 mg via SUBCUTANEOUS
  Filled 2015-08-25 (×2): qty 0.8

## 2015-08-25 MED ORDER — IOPAMIDOL (ISOVUE-370) INJECTION 76%
INTRAVENOUS | Status: AC
  Filled 2015-08-25: qty 100

## 2015-08-25 MED ORDER — SODIUM CHLORIDE 0.9% FLUSH
10.0000 mL | INTRAVENOUS | Status: DC | PRN
  Administered 2015-08-25: 10 mL via INTRAVENOUS

## 2015-08-25 MED ORDER — SODIUM CHLORIDE 0.9% FLUSH
10.0000 mL | Freq: Two times a day (BID) | INTRAVENOUS | Status: DC
  Administered 2015-08-25 – 2015-08-26 (×3): 10 mL via INTRAVENOUS

## 2015-08-25 MED ORDER — ALBUTEROL SULFATE (2.5 MG/3ML) 0.083% IN NEBU
2.5000 mg | INHALATION_SOLUTION | Freq: Three times a day (TID) | RESPIRATORY_TRACT | Status: DC
  Administered 2015-08-25 – 2015-08-27 (×7): 2.5 mg via RESPIRATORY_TRACT
  Filled 2015-08-25 (×7): qty 3

## 2015-08-25 MED ORDER — POTASSIUM CHLORIDE CRYS ER 20 MEQ PO TBCR
40.0000 meq | EXTENDED_RELEASE_TABLET | Freq: Three times a day (TID) | ORAL | Status: DC
  Administered 2015-08-25 – 2015-08-27 (×7): 40 meq via ORAL
  Filled 2015-08-25 (×6): qty 2

## 2015-08-25 NOTE — Progress Notes (Addendum)
PATIENT DETAILS Name: Nina Howell Age: 75 y.o. Sex: female Date of Birth: February 13, 1941 Admit Date: 08/19/2015 Admitting Physician Rise Patience, MD [id number] N, MD  Subjective: Continues to feel better. Significant reduction in leg swelling per patient.  Assessment/Plan: Principal Problem: Acute respiratory failure with hypoxia and hypercarbia: Multifactorial etiology, secondary to acute diastolic heart failure, COPD exacerbation and OSA/OSH. Significantly improved with diuretics, bronchodilators, BiPAP. Suspect will require oxygen on discharge.Lower Ext Doppler pending.  Active Problems: Acute diastolic heart failure: Significantly more compensated, -4.6 L so far, weight decreased to 338 pounds (363 pounds on admission). Continue 60 mg of Demadex twice a day along with Aldactone. Await 2-D echocardiogram.  Addendum 3:55 pm Echo shows severely dilated RV with severely reduced RV systolic function(new-not seen on prior Echo) PA pressure of 69. Will consult CHF team. VQ scan ordered  COPD exacerbation: Much improved, lungs clear on exam without any rhonchi. Completed course of Levaquin, prednisone tapered and discontinued on 4/6.  Continue bronchodilators as ordered.  Hypokalemia: Replete and recheck.  Obstructive sleep apnea: Continue BiPAP.  Type 2 diabetes: CBGs stable, continue insulin 70/30 30 units twice a day along with SSI.  Dyslipidemia: Continue statin  Hypothyroidism: Continue with levothyroxine.  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  Morbid obesity: Counseled regarding importance of weight loss  Disposition: Remain inpatient-home 4/7  Antimicrobial agents  See below  Anti-infectives    Start     Dose/Rate Route Frequency Ordered Stop   08/19/15 0500  levofloxacin (LEVAQUIN) IVPB 750 mg  Status:  Discontinued     750 mg 100 mL/hr over 90 Minutes Intravenous Every 24 hours 08/19/15 0447 08/22/15 1400       DVT Prophylaxis: Prophylactic Lovenox   Code Status: Full code   Family Communication None at bedside  Procedures: None  CONSULTS:  None  Time spent 25 minutes-Greater than 50% of this time was spent in counseling, explanation of diagnosis, planning of further management, and coordination of care.  MEDICATIONS: Scheduled Meds: . AEROCHAMBER PLUS FLO-VU LARGE  1 each Other Once  . albuterol  2.5 mg Inhalation TID  . antiseptic oral rinse  7 mL Mouth Rinse q12n4p  . aspirin EC  81 mg Oral Daily  . atorvastatin  20 mg Oral QODAY  . budesonide (PULMICORT) nebulizer solution  0.25 mg Nebulization BID  . chlorhexidine  15 mL Mouth Rinse BID  . enoxaparin (LOVENOX) injection  40 mg Subcutaneous Q24H  . insulin aspart  0-9 Units Subcutaneous TID WC  . insulin aspart protamine- aspart  30 Units Subcutaneous BID WC  . levothyroxine  50 mcg Oral QAC breakfast  . Liraglutide  1.8 mg Subcutaneous Daily  . multivitamin with minerals  0.5 tablet Oral Daily  . potassium chloride SA  40 mEq Oral TID  . sertraline  100 mg Oral QHS  . spironolactone  25 mg Oral Daily  . torsemide  60 mg Oral BID  . vitamin B-12  2,500 mcg Oral Daily   Continuous Infusions:  PRN Meds:.acetaminophen **OR** acetaminophen, albuterol, ondansetron **OR** ondansetron (ZOFRAN) IV    PHYSICAL EXAM: Vital signs in last 24 hours: Filed Vitals:   08/24/15 2044 08/24/15 2316 08/25/15 0500 08/25/15 0913  BP: 106/61  100/73   Pulse: 81 82 79   Temp: 97.8 F (36.6 C)  97.9 F (36.6 C)   TempSrc: Oral  Oral   Resp: 18 18 18    Height:  Weight:   153.679 kg (338 lb 12.8 oz)   SpO2: 100% 99% 93% 90%    Weight change: 0.091 kg (3.2 oz) Filed Weights   08/23/15 0500 08/24/15 0404 08/25/15 0500  Weight: 152.318 kg (335 lb 12.8 oz) 153.588 kg (338 lb 9.6 oz) 153.679 kg (338 lb 12.8 oz)   Body mass index is 64.05 kg/(m^2).   Gen Exam: Awake and alert with clear speech.   Neck: Supple, No JVD.    Chest: B/L Clear anteriorly without any rhonchi/rales.   CVS: S1 S2 Regular, no murmurs.  Abdomen: soft, BS +, non tender, non distended.  Extremities: + edema, lower extremities warm to touch. Neurologic: Non Focal.   Skin: No Rash.   Wounds: N/A.   Intake/Output from previous day:  Intake/Output Summary (Last 24 hours) at 08/25/15 1358 Last data filed at 08/25/15 0600  Gross per 24 hour  Intake    360 ml  Output    200 ml  Net    160 ml     LAB RESULTS: CBC  Recent Labs Lab 08/19/15 0123 08/19/15 0506 08/20/15 0343 08/21/15 0607 08/22/15 0525 08/24/15 0629  WBC 13.6* 12.8* 9.0 12.6* 13.5* 15.3*  HGB 12.0 12.0 10.8* 10.8* 11.8* 13.0  HCT 37.7 37.2 35.0* 34.5* 37.4 40.9  PLT 396 390 379 351 373 350  MCV 86.1 87.3 87.1 88.0 87.4 88.7  MCH 27.4 28.2 26.9 27.6 27.6 28.2  MCHC 31.8 32.3 30.9 31.3 31.6 31.8  RDW 16.6* 16.7* 16.6* 17.0* 17.0* 16.7*  LYMPHSABS 1.7 0.6* 0.8  --  2.1 1.7  MONOABS 1.1* 0.1 0.3  --  1.7* 1.9*  EOSABS 0.1 0.0 0.0  --  0.0 0.2  BASOSABS 0.0 0.0 0.0  --  0.0 0.0    Chemistries   Recent Labs Lab 08/20/15 0343 08/21/15 0607 08/22/15 0525 08/24/15 0629 08/25/15 0357  NA 134* 135 138 141 138  K 4.7 4.2 3.2* 3.2* 2.8*  CL 92* 93* 93* 82* 80*  CO2 32 34* 33* 46* 43*  GLUCOSE 257* 151* 144* 159* 103*  BUN 23* 29* 34* 41* 42*  CREATININE 0.83 0.84 0.89 0.96 0.81  CALCIUM 9.2 9.2 9.2 9.4 9.5    CBG:  Recent Labs Lab 08/24/15 1112 08/24/15 1611 08/24/15 2043 08/25/15 0736 08/25/15 1129  GLUCAP 164* 232* 273* 146* 179*    GFR Estimated Creatinine Clearance: 88.1 mL/min (by C-G formula based on Cr of 0.81).  Coagulation profile  Recent Labs Lab 08/21/15 0607  INR 1.23    Cardiac Enzymes  Recent Labs Lab 08/19/15 0506 08/19/15 0936 08/19/15 1630  TROPONINI 0.06* 0.07* 0.04*    Invalid input(s): POCBNP No results for input(s): DDIMER in the last 72 hours. No results for input(s): HGBA1C in the last 72 hours. No  results for input(s): CHOL, HDL, LDLCALC, TRIG, CHOLHDL, LDLDIRECT in the last 72 hours. No results for input(s): TSH, T4TOTAL, T3FREE, THYROIDAB in the last 72 hours.  Invalid input(s): FREET3 No results for input(s): VITAMINB12, FOLATE, FERRITIN, TIBC, IRON, RETICCTPCT in the last 72 hours. No results for input(s): LIPASE, AMYLASE in the last 72 hours.  Urine Studies No results for input(s): UHGB, CRYS in the last 72 hours.  Invalid input(s): UACOL, UAPR, USPG, UPH, UTP, UGL, UKET, UBIL, UNIT, UROB, ULEU, UEPI, UWBC, URBC, UBAC, CAST, UCOM, BILUA  MICROBIOLOGY: Recent Results (from the past 240 hour(s))  MRSA PCR Screening     Status: None   Collection Time: 08/19/15  4:00 AM  Result Value  Ref Range Status   MRSA by PCR NEGATIVE NEGATIVE Final    Comment:        The GeneXpert MRSA Assay (FDA approved for NASAL specimens only), is one component of a comprehensive MRSA colonization surveillance program. It is not intended to diagnose MRSA infection nor to guide or monitor treatment for MRSA infections.     RADIOLOGY STUDIES/RESULTS: Dg Chest Port 1 View  08/19/2015  CLINICAL DATA:  75 year old female with shortness of breath. History of CHF. EXAM: PORTABLE CHEST 1 VIEW COMPARISON:  Chest radiograph dated 04/13/2010 FINDINGS: Single portable view of the chest demonstrate moderate cardiomegaly. There is prominence of the central vasculature and interstitial markings likely mild congestive changes. Bibasilar hazy atelectatic changes noted. Developing pneumonia is not excluded. There is no pneumothorax. The left costophrenic angle has been excluded from the image. Trace right pleural effusion may be present. No acute osseous pathology identified. IMPRESSION: Cardiomegaly with mild congestive changes.  No focal consolidation. Electronically Signed   By: Anner Crete M.D.   On: 08/19/2015 01:28    Oren Binet, MD  [facility] Pager:[phone]  If 7PM-7AM, please  contact night-coverage [email or web address] Password [email or web address] 08/25/2015, 1:58 PM   LOS: 6 days

## 2015-08-25 NOTE — Progress Notes (Signed)
Patient to CT Angio in bed with O2 in stable condition, awaiting return back to unit.

## 2015-08-25 NOTE — Progress Notes (Signed)
Updated report received via Melissa RN in patient's room, reviewed new orders, meds, VS and events of the day, assumed care of patient.

## 2015-08-25 NOTE — Progress Notes (Signed)
Physical Therapy Treatment Patient Details Name: Nina Howell MRN: [id number] DOB: February 27, 1941 Today's Date: 08/25/2015    History of Present Illness 74 y.o. female admitted for 1.  Multifactorial dyspnea-CHF + COPD + OSA + restrictive component-MRC grade 2 to 3, and acute exacerbation of CHF.    PT Comments    Better endurance today, ambulating up to 75 feet on 2L supplemental O2 with SpO2 of 89%. Required 2 standing rest breaks to complete distance with 3/4 dyspnea throughout. Tolerated exercises well. Patient will continue to benefit from skilled physical therapy services to further improve independence with functional mobility.   Follow Up Recommendations  Home health PT;Supervision - Intermittent     Equipment Recommendations  Rolling walker with 5" wheels    Recommendations for Other Services OT consult     Precautions / Restrictions Precautions Precaution Comments: monitor O2 Restrictions Weight Bearing Restrictions: No    Mobility  Bed Mobility Overal bed mobility: Modified Independent             General bed mobility comments: extra time.  Transfers Overall transfer level: Needs assistance Equipment used: Rolling walker (2 wheeled) Transfers: Sit to/from Stand Sit to Stand: Supervision         General transfer comment: supervision for safety. VC for hand placement. Slow to rise but stable with RW for support.  Ambulation/Gait Ambulation/Gait assistance: Supervision Ambulation Distance (Feet): 75 Feet Assistive device: Rolling walker (2 wheeled) Gait Pattern/deviations: Step-through pattern;Decreased stride length;Trunk flexed;Wide base of support Gait velocity: decreased Gait velocity interpretation: Below normal speed for age/gender General Gait Details: Required 2 standing rest breaks to complete distance. SpO2 89% on 2L supplemental O2 today. No buckling noted, Cues for walker placement for proximity and upright posture, along with education  for energy conservation techniques.   Stairs            Wheelchair Mobility    Modified Rankin (Stroke Patients Only)       Balance Overall balance assessment: Needs assistance Sitting-balance support: No upper extremity supported;Feet supported Sitting balance-Leahy Scale: Good     Standing balance support: No upper extremity supported Standing balance-Leahy Scale: Good                      Cognition Arousal/Alertness: Awake/alert Behavior During Therapy: WFL for tasks assessed/performed Overall Cognitive Status: Within Functional Limits for tasks assessed                      Exercises General Exercises - Lower Extremity Ankle Circles/Pumps: AROM;Both;10 reps;Seated Quad Sets: Strengthening;Both;10 reps;Seated    General Comments General comments (skin integrity, edema, etc.): SpO2 at rest on 2L 96%, ambulating on 2L 89% with 3/4 dyspnea.      Pertinent Vitals/Pain Pain Assessment: No/denies pain    Home Living                      Prior Function            PT Goals (current goals can now be found in the care plan section) Acute Rehab PT Goals Patient Stated Goal: Get back to my normal self PT Goal Formulation: With patient Time For Goal Achievement: 09/07/15 Potential to Achieve Goals: Good Progress towards PT goals: Progressing toward goals    Frequency  Min 3X/week    PT Plan Current plan remains appropriate    Co-evaluation             End of Session Equipment  Utilized During Treatment: Gait belt;Oxygen Activity Tolerance: Patient tolerated treatment well Patient left: in chair;with chair alarm set     Time: [id number] PT Time Calculation (min) (ACUTE ONLY): 19 min  Charges:  $Gait Training: 8-22 mins                    G Codes:      Ellouise Newer September 08, 2015, 12:07 PM  Camille Bal [place], [place]

## 2015-08-25 NOTE — Progress Notes (Signed)
SATURATION QUALIFICATIONS: (This note is used to comply with regulatory documentation for home oxygen)  Patient Saturations on Room Air at Rest = 95%  Patient Saturations on Room Air while Ambulating = 88%  Patient Saturations on 3 Liters of oxygen while Ambulating = 95%  Please briefly explain why patient needs home oxygen: COPD, CHF

## 2015-08-25 NOTE — Progress Notes (Signed)
  Echocardiogram 2D Echocardiogram has been performed.  Jennette Dubin 08/25/2015, 10:39 AM

## 2015-08-25 NOTE — Progress Notes (Signed)
Pt came to CT for an Angio of the chest but her new IV was right above her wrist laterally which is a little low for this exam.  The technologist, Randall Hiss tried to flush it several times to see if it could be used anyway but the pt complained of it hurting.  I checked it also and it seemed to flush but the pt still complained of pain.  We looked but there were no other available sites to start an IV so the pt had to be sent back to her room.  I call Darlene her RN to let her know and she will relay this to her MD and family to see what can be done next.

## 2015-08-26 ENCOUNTER — Inpatient Hospital Stay (HOSPITAL_COMMUNITY): Payer: BLUE CROSS/BLUE SHIELD

## 2015-08-26 DIAGNOSIS — I272 Other secondary pulmonary hypertension: Secondary | ICD-10-CM

## 2015-08-26 DIAGNOSIS — R609 Edema, unspecified: Secondary | ICD-10-CM

## 2015-08-26 DIAGNOSIS — I319 Disease of pericardium, unspecified: Secondary | ICD-10-CM

## 2015-08-26 LAB — GLUCOSE, CAPILLARY
GLUCOSE-CAPILLARY: 51 mg/dL — AB (ref 65–99)
GLUCOSE-CAPILLARY: 69 mg/dL (ref 65–99)
GLUCOSE-CAPILLARY: 83 mg/dL (ref 65–99)
GLUCOSE-CAPILLARY: 214 mg/dL — AB (ref 65–99)
GLUCOSE-CAPILLARY: 217 mg/dL — AB (ref 65–99)
Glucose-Capillary: 55 mg/dL — ABNORMAL LOW (ref 65–99)
Glucose-Capillary: 127 mg/dL — ABNORMAL HIGH (ref 65–99)
Glucose-Capillary: 189 mg/dL — ABNORMAL HIGH (ref 65–99)

## 2015-08-26 LAB — BASIC METABOLIC PANEL
ANION GAP: 15 (ref 5–15)
BUN: 38 mg/dL — AB (ref 6–20)
CHLORIDE: 80 mmol/L — AB (ref 101–111)
CO2: 43 mmol/L — ABNORMAL HIGH (ref 22–32)
Calcium: 9.4 mg/dL (ref 8.9–10.3)
Creatinine, Ser: 0.74 mg/dL (ref 0.44–1.00)
GFR calc Af Amer: >60 mL/min (ref >60)
GLUCOSE: 45 mg/dL — AB (ref 65–99)
POTASSIUM: 2.6 mmol/L — AB (ref 3.5–5.1)
SODIUM: 138 mmol/L (ref 135–145)

## 2015-08-26 LAB — MAGNESIUM: MAGNESIUM: 1.5 mg/dL — AB (ref 1.7–2.4)

## 2015-08-26 MED ORDER — DEXTROSE 50 % IV SOLN: 25.0000 mL | Freq: Once | INTRAVENOUS | Status: DC

## 2015-08-26 MED ORDER — BISACODYL 10 MG RE SUPP
10.0000 mg | Freq: Every day | RECTAL | Status: DC | PRN
  Administered 2015-08-26: 10 mg via RECTAL
  Filled 2015-08-26 (×2): qty 1

## 2015-08-26 MED ORDER — POLYETHYLENE GLYCOL 3350 17 G PO PACK
17.0000 g | PACK | Freq: Every day | ORAL | Status: DC
  Administered 2015-08-26: 17 g via ORAL
  Filled 2015-08-26 (×2): qty 1

## 2015-08-26 MED ORDER — IOPAMIDOL (ISOVUE-370) INJECTION 76%
INTRAVENOUS | Status: AC
  Administered 2015-08-26: 03:00:00
  Filled 2015-08-26: qty 100

## 2015-08-26 MED ORDER — SENNA 8.6 MG PO TABS
2.0000 | ORAL_TABLET | Freq: Every day | ORAL | Status: DC
  Administered 2015-08-26 – 2015-08-27 (×2): 17.2 mg via ORAL
  Filled 2015-08-26 (×2): qty 2

## 2015-08-26 MED ORDER — POTASSIUM CHLORIDE 10 MEQ/100ML IV SOLN
10.0000 meq | INTRAVENOUS | Status: AC
  Administered 2015-08-26 (×3): 10 meq via INTRAVENOUS
  Filled 2015-08-26 (×3): qty 100

## 2015-08-26 MED ORDER — POTASSIUM CHLORIDE CRYS ER 20 MEQ PO TBCR: 40.0000 meq | EXTENDED_RELEASE_TABLET | Freq: Once | ORAL | Status: DC

## 2015-08-26 MED ORDER — MAGNESIUM SULFATE 2 GM/50ML IV SOLN
2.0000 g | Freq: Once | INTRAVENOUS | Status: AC
  Administered 2015-08-26: 2 g via INTRAVENOUS
  Filled 2015-08-26: qty 50

## 2015-08-26 MED ORDER — INSULIN ASPART PROT & ASPART (70-30 MIX) 100 UNIT/ML ~~LOC~~ SUSP
20.0000 [IU] | Freq: Two times a day (BID) | SUBCUTANEOUS | Status: DC
  Administered 2015-08-26 – 2015-08-27 (×3): 20 [IU] via SUBCUTANEOUS

## 2015-08-26 NOTE — Progress Notes (Signed)
[facility] [employer] crew) made aware of the problem with patient's CTA and her IV site. He went in to her room and was promptly able to place an IV in her Right AC and I called CT scan back again to come back for the patient. This scan was stat d/t high risk of the patient having a P.E. They informed me that she would be able to come after the next shift arrived. The MD on call was then notified when she called to check on the patient. Patient given the information and agrees to wait to go back for her test, awaiting a call back.

## 2015-08-26 NOTE — Progress Notes (Signed)
VASCULAR LAB PRELIMINARY  PRELIMINARY  PRELIMINARY  PRELIMINARY  Bilateral lower extremity venous duplex  completed.    Preliminary report:  Bilateral:  No evidence of DVT, superficial thrombosis, or Baker's Cyst.    Amarria Andreasen, RVT 08/26/2015, 1:32 PM

## 2015-08-26 NOTE — Progress Notes (Signed)
Abnormals labs called and rechecked with our CBG and BS was 55, gave 2 glasses of OJ and some PB and recheck 15 minutes later was 69, sent a text page to [facility] Dr Baltazar Najjar re: K+ of 2.6 and CTA results, awaiting further orders. Patient remained A&O x 4 with VSS

## 2015-08-26 NOTE — Care Management Important Message (Signed)
Important Message  Patient Details  Name: Nina Howell MRN: [id number] Date of Birth: 21-Mar-1941   Medicare Important Message Given:  Yes    Nathen May 08/26/2015, 10:47 AM

## 2015-08-26 NOTE — Progress Notes (Signed)
Patient requesting front wheel walker also.

## 2015-08-26 NOTE — Progress Notes (Signed)
Patient given information on COPD and her care at home and on CTA and risks/benefits, will continue to monitor but test was explained to her

## 2015-08-26 NOTE — Progress Notes (Signed)
Ct called again, D/T the time, for an update on when the patient would go for her stat CTA, was informed that they needed to get the ED caught up, relayed this information to my charge nurse, will inform the patient if she's still awake because since we were waiting , RT put patient on her CPAP machine.

## 2015-08-26 NOTE — Consult Note (Signed)
Please see my consult note from same date.

## 2015-08-26 NOTE — Progress Notes (Signed)
PT Cancellation Note  Patient Details Name: Gyzelle Bex MRN: [id number] DOB: May 17, 1941   Cancelled Treatment:    Reason Eval/Treat Not Completed: Medical issues which prohibited therapy. Holding PT today due to low potassium levels which are below PT therapeutic guidelines. Will continue to follow as appropriate.    Cassell Clement, PT, CSCS Pager [phone] Office [phone]  08/26/2015, 4:24 PM

## 2015-08-26 NOTE — Progress Notes (Signed)
Update given to [facility], reviewed labs, and test results of her CTA, she will still need her Doppler lower extremities today. BS rechecked and is now 89. Patient assisted OOB to [facility] and will need to be changed, she was incontinent of large amount of urine. Dr Sloan Leiter text paged Re: abnormal labs and patient is having problems with constipation. MD will place new orders but will not have to give D50, will have patient eat her breakfast, will transfer care to [place] in stable condition.

## 2015-08-26 NOTE — Progress Notes (Signed)
[facility] PROGRESS NOTE  Nina Howell [id number] DOB: [birth year] DOA: 08/19/2015 PCP: Maximino Greenland, MD  Assessment/Plan: Principal Problem: Acute respiratory failure with hypoxia and hypercarbia: Multifactorial etiology, secondary to acute diastolic heart failure, COPD exacerbation and OSA/OSH. Significantly improved with diuretics, bronchodilators, BiPAP. Lower Ext Doppler pending. O2 drops with ambulation, will be discharged with home oxygen.  Active Problems: Acute diastolic heart failure/acute right-sided heart failure: Significantly more compensated, -4.6 L so far, weight decreased to 332 pounds (363 pounds on admission). 2-D echo shows severely dilated RV with severely reduced RV systolic function(new-not seen on prior Echo) PA pressure of 69. Heart failure likely from increased pulmonary hypertension in the context of obesity and COPD. CTA with moderate-large pericardial effusion, negative for pulmonary embolism. Will consult CHF team, continue diuretics for now.  COPD exacerbation: Much improved, lungs clear on exam without any rhonchi. Completed course of Levaquin, prednisone tapered and discontinued on 4/6. Continue bronchodilators as ordered.  Hypokalemia: Replete and recheck.  Hypomagnesemia: Replete and recheck.  Constipation: Senakot, dulcolax, miralax. Follow.  Obstructive sleep apnea: Continue BiPAP.  Type 2 diabetes: Hypoglycemia this morning-  insulin 70/30 decreased to 20 units twice a day -follow and adjust accordingly   Dyslipidemia: Continue statin  Hypothyroidism: Continue levothyroxine.  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  Morbid obesity: Counseled regarding importance of weight loss  Code Status: Full Family Communication: None at bedside Disposition Plan: Remain inpatient   Consultants:  CHF team  Procedures:  None  Antibiotics:  None  HPI/Subjective: Continues to feel better with reduction in leg  swelling.  Objective: Filed Vitals:   08/25/15 2033 08/26/15 0600  BP:  104/67  Pulse: 81 81  Temp:  97.5 F (36.4 C)  Resp: 18 20   No intake or output data in the 24 hours ending 08/26/15 1137 Filed Weights   08/24/15 0404 08/25/15 0500 08/26/15 0725  Weight: 153.588 kg (338 lb 9.6 oz) 153.679 kg (338 lb 12.8 oz) 150.685 kg (332 lb 3.2 oz)    Exam:   General:  Awake, alert, no apparent distress. Obese. Sitting in chair, cooperative.  Neck: Supple, no JVD  Cardiovascular: RRR  Respiratory: CTAB  Abdomen: soft, nontender, nondisteded  Extremities: + edema, lower extremities warm to touch  Data Reviewed: Basic Metabolic Panel:  Recent Labs Lab 08/21/15 0607 08/22/15 0525 08/24/15 0629 08/25/15 0357 08/26/15 0458 08/26/15 0745  NA 135 138 141 138 138  --   K 4.2 3.2* 3.2* 2.8* 2.6*  --   CL 93* 93* 82* 80* 80*  --   CO2 34* 33* 46* 43* 43*  --   GLUCOSE 151* 144* 159* 103* 45*  --   BUN 29* 34* 41* 42* 38*  --   CREATININE 0.84 0.89 0.96 0.81 0.74  --   CALCIUM 9.2 9.2 9.4 9.5 9.4  --   MG  --   --   --   --   --  1.5*   Liver Function Tests:  Recent Labs Lab 08/21/15 0607 08/22/15 0525 08/24/15 0629  AST 24 23 19   ALT 31 31 31   ALKPHOS 92 92 90  BILITOT 0.6 0.7 1.0  PROT 7.0 7.3 7.8  ALBUMIN 2.8* 2.8* 2.9*   No results for input(s): LIPASE, AMYLASE in the last 168 hours. No results for input(s): AMMONIA in the last 168 hours. CBC:  Recent Labs Lab 08/20/15 0343 08/21/15 0607 08/22/15 0525 08/24/15 0629  WBC 9.0 12.6* 13.5* 15.3*  NEUTROABS 7.9*  --  9.6* 11.5*  HGB 10.8* 10.8* 11.8* 13.0  HCT 35.0* 34.5* 37.4 40.9  MCV 87.1 88.0 87.4 88.7  PLT 379 351 373 350   Cardiac Enzymes:  Recent Labs Lab 08/19/15 1630  TROPONINI 0.04*   BNP (last 3 results)  Recent Labs  08/19/15 0123  BNP 262.3*    ProBNP (last 3 results) No results for input(s): PROBNP in the last 8760 hours.  CBG:  Recent Labs Lab 08/26/15 0626  08/26/15 0639 08/26/15 0642 08/26/15 0715 08/26/15 0751  GLUCAP 55* 51* 69 83 127*    Recent Results (from the past 240 hour(s))  MRSA PCR Screening     Status: None   Collection Time: 08/19/15  4:00 AM  Result Value Ref Range Status   MRSA by PCR NEGATIVE NEGATIVE Final    Comment:        The GeneXpert MRSA Assay (FDA approved for NASAL specimens only), is one component of a comprehensive MRSA colonization surveillance program. It is not intended to diagnose MRSA infection nor to guide or monitor treatment for MRSA infections.      Studies: Dg Chest 2 View  08/25/2015  CLINICAL DATA:  Shortness of breath. EXAM: CHEST  2 VIEW COMPARISON:  08/19/2015 FINDINGS: There is marked cardiac enlargement noted. There is no pleural effusion or edema. No airspace opacities identified. IMPRESSION: 1. Cardiac enlargement. 2. No acute findings. Electronically Signed   By: Kerby Moors M.D.   On: 08/25/2015 19:20   Ct Angio Chest Pe W/cm &/or Wo Cm  08/26/2015  CLINICAL DATA:  Pulmonary embolism EXAM: CT ANGIOGRAPHY CHEST WITH CONTRAST TECHNIQUE: Multidetector CT imaging of the chest was performed using the standard protocol during bolus administration of intravenous contrast. Multiplanar CT image reconstructions and MIPs were obtained to evaluate the vascular anatomy. CONTRAST:  100 mL Omnipaque 300 intravenous COMPARISON:  11/25/2009 FINDINGS: Cardiovascular: There is good opacification of the pulmonary arteries. There is no pulmonary embolism. The thoracic aorta is normal in caliber and intact. Lungs: Mild mosaic attenuation, suggesting air trapping. Mild linear basilar atelectatic changes. Central airways: Patent Effusions: No pleural effusions. There is a moderately large pericardial effusion. Lymphadenopathy: None Esophagus: Unremarkable Upper abdomen: Cirrhotic appearing morphologic irregularities of the liver. Incompletely imaged left adrenal nodule measuring at least 1.4 cm. Musculoskeletal:  No significant skeletal lesion. Review of the MIP images confirms the above findings. IMPRESSION: 1. Moderately large pericardial effusion. 2. Negative for acute pulmonary embolism. 3. Incompletely imaged liver, with cirrhotic appearing morphologic irregularities. 4. Left adrenal nodule, only marginally imaged at the bottom of this study. Electronically Signed   By: Andreas Newport M.D.   On: 08/26/2015 03:18   Nm Pulmonary Perf And Vent  08/25/2015  CLINICAL DATA:  History of obstructive sleep apnea. COPD exacerbation. Acute diastolic heart failure. EXAM: NUCLEAR MEDICINE VENTILATION - PERFUSION LUNG SCAN TECHNIQUE: Ventilation images were obtained in multiple projections using inhaled aerosol Tc-32m DTPA. Perfusion images were obtained in multiple projections after intravenous injection of Tc-19m MAA. RADIOPHARMACEUTICALS:  4.1 MCi Technetium-56m DTPA aerosol inhalation and 4.1 mCi Technetium-38m MAA IV COMPARISON:  Chest radiograph from 08/25/2015 FINDINGS: Chest x-ray: Marked cardiac enlargement is identified. The lung volumes appear low. No focal lung opacity identified. Ventilation: Markedly diminished ventilation to both lungs identified. Clumping of the radiopharmaceutical within the central airway is noted which may be a manifestation of PA hypertension. Perfusion: Large area of relative photopenia corresponding to patient's enlarged cardiac silhouette noted. There is a medium to large size segmental perfusion defect within the lateral aspect  of the right lower lobe. Within the left upper lobe there is a large segmental perfusion defects. IMPRESSION: 1. Significantly diminished exam detail due to patient's body habitus, enlarged cardiac silhouette and marked obstructive pulmonary disease. 2. Examination is considered intermediate probability for acute pulmonary embolus. 3. There are the equivalent of 1-1/2 to 2 large segmental perfusion defects identified. The ventilation images are sub optimal to  assess for V/Q mismatch. 4. Recommend further evaluation with CT of the chest to assess for presence of acute or chronic pulmonary emboli. Electronically Signed   By: Kerby Moors M.D.   On: 08/25/2015 18:51    Scheduled Meds: . AEROCHAMBER PLUS FLO-VU LARGE  1 each Other Once  . albuterol  2.5 mg Inhalation TID  . antiseptic oral rinse  7 mL Mouth Rinse q12n4p  . aspirin EC  81 mg Oral Daily  . atorvastatin  20 mg Oral QODAY  . budesonide (PULMICORT) nebulizer solution  0.25 mg Nebulization BID  . chlorhexidine  15 mL Mouth Rinse BID  . enoxaparin (LOVENOX) injection  0.5 mg/kg Subcutaneous Q24H  . insulin aspart  0-9 Units Subcutaneous TID WC  . insulin aspart protamine- aspart  20 Units Subcutaneous BID WC  . levothyroxine  50 mcg Oral QAC breakfast  . magnesium sulfate 1 - 4 g bolus IVPB  2 g Intravenous Once  . multivitamin with minerals  0.5 tablet Oral Daily  . polyethylene glycol  17 g Oral Daily  . potassium chloride  10 mEq Intravenous Q1 Hr x 3  . potassium chloride SA  40 mEq Oral TID  . senna  2 tablet Oral Daily  . sertraline  100 mg Oral QHS  . sodium chloride flush  10 mL Intravenous Q12H  . spironolactone  25 mg Oral Daily  . torsemide  60 mg Oral BID  . vitamin B-12  2,500 mcg Oral Daily   Continuous Infusions:   Principal Problem:   Acute respiratory failure with hypoxia and hypercarbia (HCC) Active Problems:   Obstructive sleep apnea   Essential hypertension   Congestive heart failure (HCC)   COPD exacerbation (HCC)   Acute respiratory failure (HCC)   Acute on chronic congestive heart failure ([place])  Time spent: 25 minutes  Kara Dies PA-S [facility] If 7PM-7AM, please contact night-coverage at [email or web address], password [facility] 08/26/2015, 11:37 AM  LOS: 7 days    Attending MD note  Patient was seen, examined,treatment plan was discussed with the PA-S.  I have personally reviewed the clinical findings, lab, imaging studies and management of  this patient in detail. I agree with the documentation, as recorded by the PA-S.   Continues to improve, weight down to 332 pounds, negative balance of 4.6 L so far. Continue diuretics, renal function is stable. Continue to replete potassium. Has predominant right-sided heart failure-VQ scan of intermediate probability, CTA chest negative for PE. Have consulted CHF team. Await their recommendations. Suspect discharge over the weekend.  Rest as above   [facility]

## 2015-08-26 NOTE — Progress Notes (Signed)
Transport arrived to pick up the patient and I assisted him in getting her ready. Patient was placed back on 2 Liters O2 via N/C for the transport, awaiting her return. Patient left in stable condition and no c/o pain or s/s of distress noted prior to her departure.

## 2015-08-27 ENCOUNTER — Other Ambulatory Visit: Payer: Self-pay

## 2015-08-27 DIAGNOSIS — J9601 Acute respiratory failure with hypoxia: Secondary | ICD-10-CM

## 2015-08-27 DIAGNOSIS — J9602 Acute respiratory failure with hypercapnia: Secondary | ICD-10-CM

## 2015-08-27 LAB — BASIC METABOLIC PANEL
Anion gap: 11 (ref 5–15)
BUN: 33 mg/dL — AB (ref 6–20)
CO2: 43 mmol/L — ABNORMAL HIGH (ref 22–32)
Calcium: 9.3 mg/dL (ref 8.9–10.3)
Chloride: 86 mmol/L — ABNORMAL LOW (ref 101–111)
Creatinine, Ser: 0.83 mg/dL (ref 0.44–1.00)
GFR calc Af Amer: >60 mL/min (ref >60)
GLUCOSE: 118 mg/dL — AB (ref 65–99)
POTASSIUM: 4 mmol/L (ref 3.5–5.1)
Sodium: 140 mmol/L (ref 135–145)

## 2015-08-27 LAB — CBC
HCT: 40.6 % (ref 36.0–46.0)
Hemoglobin: 12.5 g/dL (ref 12.0–15.0)
MCH: 27.2 pg (ref 26.0–34.0)
MCHC: 30.8 g/dL (ref 30.0–36.0)
MCV: 88.3 fL (ref 78.0–100.0)
PLATELETS: 368 10*3/uL (ref 150–400)
RBC: 4.6 MIL/uL (ref 3.87–5.11)
RDW: 16.3 % — AB (ref 11.5–15.5)
WBC: 14.8 10*3/uL — ABNORMAL HIGH (ref 4.0–10.5)

## 2015-08-27 LAB — GLUCOSE, CAPILLARY
Glucose-Capillary: 200 mg/dL — ABNORMAL HIGH (ref 65–99)
Glucose-Capillary: 195 mg/dL — ABNORMAL HIGH (ref 65–99)

## 2015-08-27 LAB — MAGNESIUM: Magnesium: 1.7 mg/dL (ref 1.7–2.4)

## 2015-08-27 LAB — SEDIMENTATION RATE: Sed Rate: 35 mm/hr — ABNORMAL HIGH (ref 0–22)

## 2015-08-27 MED ORDER — TORSEMIDE 20 MG PO TABS: 10.0000 mg | ORAL_TABLET | Freq: Two times a day (BID) | ORAL | Status: DC

## 2015-08-27 MED ORDER — TORSEMIDE 10 MG PO TABS: 20.0000 mg | ORAL_TABLET | Freq: Two times a day (BID) | ORAL | Status: DC | PRN

## 2015-08-27 MED ORDER — POLYETHYLENE GLYCOL 3350 17 G PO PACK: 17.0000 g | PACK | Freq: Every day | ORAL | Status: AC | PRN

## 2015-08-27 NOTE — Progress Notes (Signed)
Updated report given via Melissa Rn in patient's room, reviewed VS, new orders, and events of the day, assumed care of patient

## 2015-08-27 NOTE — Consult Note (Addendum)
Advanced Heart Failure Team Consult Note  Referring Physician: Dr Sloan Leiter Primary Physician: Dr Baird Cancer  Primary Cardiologist:   Pulmologist: Dr Elsworth Soho  Reason for Consultation: Heart Failure   HPI:   Nina Howell is a 75 y.o. female with history of diastolic CHF, OSA, DM, morbid obesity, COPD/asthma presents to the ER because of increasing shortness of breath.  Sees Dr Elsworth Soho for morbid obesity, OHS and OSA on CPAP has been encouraged to lose weight. Previously seen by Dr [name]. Echo 2011 done for dyspnea showed estimated PA pressure 56 mmHg and EF 45-50%. Underwent cath for abnormal ECG and nuclear study. Normal coronaries 2011. Follows with Dr. Johnsie Cancel now.   Recently treated with prednisone and cellcept for bullous pemphigoid   Over the last 3 weeks she had worsening dyspnea. PCP started antibiotics with little relief. She called EMS due to worsening dyspnea. Placed on short term bipap. Placed on antibiotics, soludmedrol, nebulizers and demadex.  She has been diuresed with demadex 60 bid (takes 10 bid at home). Overall diuresed 30 pounds. On ECHO RV severely dilated and LV EF 50-55%. With RVSP 69. Moderate pericardial effusion  Feels back to baseline. Says at home she is able to do all ADLs and go to store without too much problem. Wears CPAP routinely. Renal function stable. No CP.   VQ 4/6: Limited study due to body habitus. Possible 2 perfusion defects -> recommend chest CT CT 4/7 -> No PE  Moderate to large pericardial effusion    Review of Systems: [y] = yes, [ ]  = no   General: Weight gain [ ] ; Weight loss [ ] ; Anorexia [ ] ; Fatigue [ ] ; Fever [ ] ; Chills [ ] ; Weakness [Y ]  Cardiac: Chest pain/pressure [ ] ; Resting SOB [ ] ; Exertional SOB [ y]; Orthopnea [ y]; Pedal Edema [ y]; Palpitations [ ] ; Syncope [ ] ; Presyncope [ ] ; Paroxysmal nocturnal dyspnea[ ]   Pulmonary: Cough [ ] ; Pryor Montes ]; Hemoptysis[ ] ; Sputum [ ] ; Snoring Blue.Reese ]  GI: Vomiting[ ] ; Dysphagia[ ] ;  Melena[ ] ; Hematochezia [ ] ; Heartburn[ ] ; Abdominal pain [ ] ; Constipation [ ] ; Diarrhea [ ] ; BRBPR [ ]   GU: Hematuria[ ] ; Dysuria [ ] ; Nocturia[ ]   Vascular: Pain in legs with walking [ ] ; Pain in feet with lying flat [ ] ; Non-healing sores [ ] ; Stroke [ ] ; TIA [ ] ; Slurred speech [ ] ;  Neuro: Headaches[ y]; Vertigo[ ] ; Seizures[ ] ; Paresthesias[ ] ;Blurred vision [ ] ; Diplopia [ ] ; Vision changes [ ]   Ortho/Skin: Arthritis Blue.Reese ]; Joint pain Blue.Reese ]; Muscle pain [ ] ; Joint swelling [ ] ; Back Pain [ ] ; Rash [ ]   Psych: Depression[ ] ; Anxiety[ ]   Heme: Bleeding problems [ ] ; Clotting disorders [ ] ; Anemia [ ]   Endocrine: Diabetes Blue.Reese ]; Thyroid dysfunction[ y]  Home Medications Prior to Admission medications   Medication Sig Start Date End Date Taking? Authorizing Provider  albuterol (PROVENTIL HFA;VENTOLIN HFA) 108 (90 Base) MCG/ACT inhaler Inhale 2 puffs into the lungs every 4 (four) hours as needed for wheezing or shortness of breath.   Yes Historical Provider, MD  aspirin 81 MG tablet Take 81 mg by mouth daily.   Yes Historical Provider, MD  atorvastatin (LIPITOR) 20 MG tablet Take 20 mg by mouth every other day.   Yes Historical Provider, MD  beclomethasone (QVAR) 80 MCG/ACT inhaler Inhale 2 puffs into the lungs 2 (two) times daily.   Yes Historical Provider, MD  Cholecalciferol (VITAMIN D3) 1000 UNITS CAPS Take  1 tablet by mouth daily.     Yes Historical Provider, MD  CRANBERRY PO Take 3 tablets by mouth daily.    Yes Historical Provider, MD  Cyanocobalamin (VITAMIN B-12) 2500 MCG SUBL Place 1 tablet under the tongue daily.   Yes Historical Provider, MD  doxycycline (VIBRAMYCIN) 100 MG capsule Take 100 mg by mouth 2 (two) times daily.   Yes Historical Provider, MD  fluocinonide (LIDEX) 0.05 % external solution Apply 1 application topically 2 (two) times daily as needed (dermatosis).    Yes Historical Provider, MD  insulin NPH-regular Human (NOVOLIN 70/30) (70-30) 100 UNIT/ML injection Inject  25-30 Units into the skin 2 (two) times daily with a meal. 25 units in the morning and 30 units at bedtime   Yes Historical Provider, MD  levothyroxine (SYNTHROID, LEVOTHROID) 50 MCG tablet Take 50 mcg by mouth daily.     Yes Historical Provider, MD  Liraglutide (VICTOZA) 18 MG/3ML SOPN Inject 1.8 mg into the skin daily.   Yes Historical Provider, MD  metFORMIN (GLUCOPHAGE) 1000 MG tablet Take 1,000 mg by mouth 2 (two) times daily with a meal.   Yes Historical Provider, MD  Multiple Vitamin (MULTIVITAMIN WITH MINERALS) TABS tablet Take 0.5 tablets by mouth daily.   Yes Historical Provider, MD  potassium chloride SA (K-DUR,KLOR-CON) 20 MEQ tablet Take 20 mEq by mouth every morning.    Yes Historical Provider, MD  sertraline (ZOLOFT) 50 MG tablet Take 100 mg by mouth at bedtime.    Yes Historical Provider, MD  spironolactone (ALDACTONE) 25 MG tablet Take 1 tablet (25 mg total) by mouth daily. 05/31/15  Yes Josue Hector, MD  torsemide (DEMADEX) 20 MG tablet Take 0.5 tablets (10 mg total) by mouth 2 (two) times daily. 05/31/15  Yes Josue Hector, MD    Past Medical History: Past Medical History  Diagnosis Date  . Cellulitis     ABDOMINAL WALL  . CHF (congestive heart failure) (HCC)     DECOMPENSATED SYSTOLIC CHF  . Hypoventilation   . Hypokalemia   . CHF (congestive heart failure) ([place])   . Diabetes mellitus, type 2 ([place])   . Hypothyroidism   . Varicose veins   . Morbid obesity ([place])   . IUD (intrauterine device) in place   . DJD (degenerative joint disease)   . Hypertension   . Anemia   . Hepatic lesion   . Asymptomatic cholelithiasis   . Hyperlipidemia   . CTS (carpal tunnel syndrome)   . 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 Acute pancreatitis   . Sleep apnea     cpap  . Rash and nonspecific skin eruption     all over- 12-17-14 "drying in _ Auto immune problem"-seeing dermalogist    Past Surgical History: Past Surgical History  Procedure Laterality Date  .  Shoulder surgery  2009    RIGHT SHOULDER  . Cardiac catheterization  2011    pt states "clean report"  . Tubal ligation    . Dilation and curettage of uterus    . Colonoscopy with propofol N/A 12/24/2014    Procedure: COLONOSCOPY WITH PROPOFOL;  Surgeon: Carol Ada, MD;  Location: [facility];  Service: Endoscopy;  Laterality: N/A;    Family History: Family History  Problem Relation Age of Onset  . Cancer Mother     throat  . Diabetes Father   . Leukemia Father     Social History: Social History   Social History  . Marital Status: Married  Spouse Name: N/A  . Number of Children: 4  . Years of Education: N/A   Occupational History  . retired     Education officer, museum   Social History Main Topics  . Smoking status: Never Smoker   . Smokeless tobacco: Never Used  . Alcohol Use: No  . Drug Use: No  . Sexual Activity: Yes    Birth Control/ Protection: IUD   Other Topics Concern  . None   Social History Narrative    Allergies:  Allergies  Allergen Reactions  . Celebrex [Celecoxib] Swelling  . Atorvastatin Other (See Comments)  . Cephalexin     REACTION: hives  . Cephalosporins     REACTION: rash  . Lasix [Furosemide] Hives  . Oxycodone-Acetaminophen Nausea Only  . Penicillins Other (See Comments)    Not known  . Oxycodone Anxiety    Felt weird    Objective:    Vital Signs:   Temp:  [97.8 F (36.6 C)-98 F (36.7 C)] 97.8 F (36.6 C) (04/08 7416) Pulse Rate:  [90-97] 92 (04/08 0633) Resp:  [16] 16 (04/08 0040) BP: (95-119)/(62-67) 95/62 mmHg (04/08 0633) SpO2:  [91 %-95 %] 92 % (04/08 3845) Weight:  [150.186 kg (331 lb 1.6 oz)] 150.186 kg (331 lb 1.6 oz) (04/08 0633) Last BM Date: 08/26/15  Weight change: Filed Weights   08/25/15 0500 08/26/15 0725 08/27/15 3646  Weight: 153.679 kg (338 lb 12.8 oz) 150.685 kg (332 lb 3.2 oz) 150.186 kg (331 lb 1.6 oz)    Intake/Output:   Intake/Output Summary (Last 24 hours) at 08/27/15 0758 Last data filed at  08/26/15 2200  Gross per 24 hour  Intake    120 ml  Output      0 ml  Net    120 ml     Physical Exam: General:  morbidy obese woman lying in bed. No resp difficulty HEENT: normal Neck: thichk supple. JVP hard to see . Carotids 2+ bilat; no bruits. No lymphadenopathy or thryomegaly appreciated. Cor: PMI nondisplaced. Regular rate & rhythm. No rubs, gallops or murmurs. Lungs: clear Abdomen: obese soft, nontender, nondistended. No hepatosplenomegaly. No bruits or masses. Good bowel sounds. Extremities: no cyanosis, clubbing, rash, trace edema Neuro: alert & orientedx3, cranial nerves grossly intact. moves all 4 extremities w/o difficulty. Affect pleasant  Telemetry: NSR 80  Labs: Basic Metabolic Panel:  Recent Labs Lab 08/22/15 0525 08/24/15 0629 08/25/15 0357 08/26/15 0458 08/26/15 0745 08/27/15 0535  NA 138 141 138 138  --  140  K 3.2* 3.2* 2.8* 2.6*  --  4.0  CL 93* 82* 80* 80*  --  86*  CO2 33* 46* 43* 43*  --  43*  GLUCOSE 144* 159* 103* 45*  --  118*  BUN 34* 41* 42* 38*  --  33*  CREATININE 0.89 0.96 0.81 0.74  --  0.83  CALCIUM 9.2 9.4 9.5 9.4  --  9.3  MG  --   --   --   --  1.5* 1.7    Liver Function Tests:  Recent Labs Lab 08/21/15 0607 08/22/15 0525 08/24/15 0629  AST 24 23 19   ALT 31 31 31   ALKPHOS 92 92 90  BILITOT 0.6 0.7 1.0  PROT 7.0 7.3 7.8  ALBUMIN 2.8* 2.8* 2.9*   No results for input(s): LIPASE, AMYLASE in the last 168 hours. No results for input(s): AMMONIA in the last 168 hours.  CBC:  Recent Labs Lab 08/21/15 0607 08/22/15 0525 08/24/15 0629 08/27/15 0535  WBC  12.6* 13.5* 15.3* 14.8*  NEUTROABS  --  9.6* 11.5*  --   HGB 10.8* 11.8* 13.0 12.5  HCT 34.5* 37.4 40.9 40.6  MCV 88.0 87.4 88.7 88.3  PLT 351 373 350 368    Cardiac Enzymes: No results for input(s): CKTOTAL, CKMB, CKMBINDEX, TROPONINI in the last 168 hours.  BNP: BNP (last 3 results)  Recent Labs  08/19/15 0123  BNP 262.3*    ProBNP (last 3 results) No  results for input(s): PROBNP in the last 8760 hours.   CBG:  Recent Labs Lab 08/26/15 0715 08/26/15 0751 08/26/15 1142 08/26/15 1707 08/26/15 2153  GLUCAP 83 127* 189* 214* 217*    Coagulation Studies: No results for input(s): LABPROT, INR in the last 72 hours.  Other results: EKG: NSR 85 low volts. Inferior Q waves  Imaging: Dg Chest 2 View  08/25/2015  CLINICAL DATA:  Shortness of breath. EXAM: CHEST  2 VIEW COMPARISON:  08/19/2015 FINDINGS: There is marked cardiac enlargement noted. There is no pleural effusion or edema. No airspace opacities identified. IMPRESSION: 1. Cardiac enlargement. 2. No acute findings. Electronically Signed   By: Kerby Moors M.D.   On: 08/25/2015 19:20   Ct Angio Chest Pe W/cm &/or Wo Cm  08/26/2015  CLINICAL DATA:  Pulmonary embolism EXAM: CT ANGIOGRAPHY CHEST WITH CONTRAST TECHNIQUE: Multidetector CT imaging of the chest was performed using the standard protocol during bolus administration of intravenous contrast. Multiplanar CT image reconstructions and MIPs were obtained to evaluate the vascular anatomy. CONTRAST:  100 mL Omnipaque 300 intravenous COMPARISON:  11/25/2009 FINDINGS: Cardiovascular: There is good opacification of the pulmonary arteries. There is no pulmonary embolism. The thoracic aorta is normal in caliber and intact. Lungs: Mild mosaic attenuation, suggesting air trapping. Mild linear basilar atelectatic changes. Central airways: Patent Effusions: No pleural effusions. There is a moderately large pericardial effusion. Lymphadenopathy: None Esophagus: Unremarkable Upper abdomen: Cirrhotic appearing morphologic irregularities of the liver. Incompletely imaged left adrenal nodule measuring at least 1.4 cm. Musculoskeletal: No significant skeletal lesion. Review of the MIP images confirms the above findings. IMPRESSION: 1. Moderately large pericardial effusion. 2. Negative for acute pulmonary embolism. 3. Incompletely imaged liver, with  cirrhotic appearing morphologic irregularities. 4. Left adrenal nodule, only marginally imaged at the bottom of this study. Electronically Signed   By: Andreas Newport M.D.   On: 08/26/2015 03:18   Nm Pulmonary Perf And Vent  08/25/2015  CLINICAL DATA:  History of obstructive sleep apnea. COPD exacerbation. Acute diastolic heart failure. EXAM: NUCLEAR MEDICINE VENTILATION - PERFUSION LUNG SCAN TECHNIQUE: Ventilation images were obtained in multiple projections using inhaled aerosol Tc-8mDTPA. Perfusion images were obtained in multiple projections after intravenous injection of Tc-985mAA. RADIOPHARMACEUTICALS:  4.1 MCi Technetium-9946mPA aerosol inhalation and 4.1 mCi Technetium-62m41m IV COMPARISON:  Chest radiograph from 08/25/2015 FINDINGS: Chest x-ray: Marked cardiac enlargement is identified. The lung volumes appear low. No focal lung opacity identified. Ventilation: Markedly diminished ventilation to both lungs identified. Clumping of the radiopharmaceutical within the central airway is noted which may be a manifestation of PA hypertension. Perfusion: Large area of relative photopenia corresponding to patient's enlarged cardiac silhouette noted. There is a medium to large size segmental perfusion defect within the lateral aspect of the right lower lobe. Within the left upper lobe there is a large segmental perfusion defects. IMPRESSION: 1. Significantly diminished exam detail due to patient's body habitus, enlarged cardiac silhouette and marked obstructive pulmonary disease. 2. Examination is considered intermediate probability for acute pulmonary embolus.  3. There are the equivalent of 1-1/2 to 2 large segmental perfusion defects identified. The ventilation images are sub optimal to assess for V/Q mismatch. 4. Recommend further evaluation with CT of the chest to assess for presence of acute or chronic pulmonary emboli. Electronically Signed   By: Kerby Moors M.D.   On: 08/25/2015 18:51      Medications:     Current Medications: . AEROCHAMBER PLUS FLO-VU LARGE  1 each Other Once  . albuterol  2.5 mg Inhalation TID  . antiseptic oral rinse  7 mL Mouth Rinse q12n4p  . aspirin EC  81 mg Oral Daily  . atorvastatin  20 mg Oral QODAY  . budesonide (PULMICORT) nebulizer solution  0.25 mg Nebulization BID  . chlorhexidine  15 mL Mouth Rinse BID  . enoxaparin (LOVENOX) injection  0.5 mg/kg Subcutaneous Q24H  . insulin aspart  0-9 Units Subcutaneous TID WC  . insulin aspart protamine- aspart  20 Units Subcutaneous BID WC  . levothyroxine  50 mcg Oral QAC breakfast  . multivitamin with minerals  0.5 tablet Oral Daily  . polyethylene glycol  17 g Oral Daily  . potassium chloride SA  40 mEq Oral TID  . senna  2 tablet Oral Daily  . sertraline  100 mg Oral QHS  . sodium chloride flush  10 mL Intravenous Q12H  . spironolactone  25 mg Oral Daily  . torsemide  60 mg Oral BID  . vitamin B-12  2,500 mcg Oral Daily    Infusions:     Assessment:   1. Acute on chronic diastolic dysfunction 2. 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 Pericardial effusion, moderate to large 4. Morbid obesity 5. Abnormal ECG with inferior Q waves 6. Obesity hypoventilation syndrome 7. OSA on CPAP 8. Bullous pemphigoid  Plan/Discussion:    Her volume status is much improved. I suspect her decompensation may have been related to recent prednisone therapy for bullous pemphigoid.   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 Her pressures were up on admit echo but this is likely due to the extra volume and not necessarily worsening PAH per se. I think her volume is now back to baseline and she can be switched back to torsemide 10 bid. Her VQ was indeterminate but chest CT did not suggest chronic PE though this is not overly sensitive test for chronic PE, that said my suspicion is low. Particularly with negative LE  dopplers.  On ECG she has inferior Qs but cath in 2011 for this revealed normal coronaries. She has not angina so I do not think there is a need for repeat angiogram urgently.   She does have a moderate-sized pericardial effusion which I think is probably related to [place] and recent volume overload/PAH. There is no clear evidence of tamponade and patients with PAH usually tolerate these effusions well. Will check routine serologies (ANA, RF, ANCA, ESR) to exclude other processes.   I think she likely can go home on torsemide 10 bid (can increase to 20 bid if weight goes back up) with close f/u in the office. I will repeat echo in 3 weeks and if effusion not improved can consider further evaluation with R/L cath.   She desperately needs weight loss to help treat OHS and PAH.  Continue CPAP and supplemental O2 to maintain sats >= 90%  Length of Stay: 8  Bensimhon, Daniel MD  08/27/2015, 7:58 AM  Advanced Heart Failure  Team Pager [phone] (M-F; 7a - 4p)  Please contact [facility] for night-coverage after hours (4p -7a ) and weekends on [email or web address]

## 2015-08-27 NOTE — Progress Notes (Signed)
CM received call from RN stating pt needs rolling walker so pt can discharge.  CM  called [name] DME rep, Merry Proud to please deliver the rolling walker and home O2 so pt can discharge.  Cm called [facility] rep, Dona to notify of discharge.  [facility] rendering HHPT/OT/RN/Aide.  No other CM needs were communicated.

## 2015-08-27 NOTE — Discharge Summary (Addendum)
PATIENT DETAILS Name: Nina Howell Age: 75 y.o. Sex: female Date of Birth: [birth year] MRN: [id number]. Admitting Physician: Rise Patience, MD GMW:[id number],GUYQI N, MD  Admit Date: 08/19/2015 Discharge date: 08/27/2015  Recommendations for Outpatient Follow-up:  1. ANA, rheumatoid factor, pancreatitis, sedimentation rate-ordered by cardiology on day of discharge-will need to be followed.  2. Please repeat CBC/BMET at next visit 3. Been discharged on oxygen. 4. Left adrenal nodule-seen incidentally on CT angiogram of the chest, stable for outpatient follow-up. 5. 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 Will defer to PCP.  PRIMARY DISCHARGE DIAGNOSIS:  Principal Problem:   Acute respiratory failure with hypoxia and hypercarbia (HCC) Active Problems:   Obstructive sleep apnea   Essential hypertension   Congestive heart failure (HCC)   COPD exacerbation (HCC)   Acute respiratory failure (HCC)   Acute on chronic congestive heart failure (HCC)      PAST MEDICAL HISTORY: Past Medical History  Diagnosis Date  . Cellulitis     ABDOMINAL WALL  . CHF (congestive heart failure) (HCC)     DECOMPENSATED SYSTOLIC CHF  . Hypoventilation   . Hypokalemia   . CHF (congestive heart failure) ([place])   . Diabetes mellitus, type 2 ([place])   . Hypothyroidism   . Varicose veins   . Morbid obesity ([place])   . IUD (intrauterine device) in place   . DJD (degenerative joint disease)   . Hypertension   . Anemia   . Hepatic lesion   . Asymptomatic cholelithiasis   . Hyperlipidemia   . CTS (carpal tunnel syndrome)   . 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 Acute pancreatitis   . Sleep apnea     cpap  . Rash and nonspecific skin eruption     all over- 12-17-14 "drying in _ Auto immune problem"-seeing dermalogist    DISCHARGE  MEDICATIONS: Current Discharge Medication List    START taking these medications   Details  polyethylene glycol (MIRALAX / GLYCOLAX) packet Take 17 g by mouth daily as needed. Qty: 14 each, Refills: 0      CONTINUE these medications which have CHANGED   Details  !! torsemide (DEMADEX) 10 MG tablet Take 2 tablets (20 mg total) by mouth 2 (two) times daily as needed (3 pound weight gain in 24 hours or 5 pounds in 1 week  (take 20 mg twice daily if you gain weight-instead of 10 mg twice daily)). Qty: 60 tablet, Refills: 0     !! - Potential duplicate medications found. Please discuss with provider.    CONTINUE these medications which have NOT CHANGED   Details  albuterol (PROVENTIL HFA;VENTOLIN HFA) 108 (90 Base) MCG/ACT inhaler Inhale 2 puffs into the lungs every 4 (four) hours as needed for wheezing or shortness of breath.    aspirin 81 MG tablet Take 81 mg by mouth daily.    atorvastatin (LIPITOR) 20 MG tablet Take 20 mg by mouth every other day.    beclomethasone (QVAR) 80 MCG/ACT inhaler Inhale 2 puffs into the lungs 2 (two) times daily.    Cholecalciferol (VITAMIN D3) 1000 UNITS CAPS Take 1 tablet by mouth daily.      CRANBERRY PO Take 3 tablets by mouth daily.     Cyanocobalamin (VITAMIN B-12) 2500 MCG SUBL Place 1 tablet under the tongue daily.    fluocinonide (LIDEX) 0.05 % external solution Apply 1 application topically  2 (two) times daily as needed (dermatosis).     insulin NPH-regular Human (NOVOLIN 70/30) (70-30) 100 UNIT/ML injection Inject 25-30 Units into the skin 2 (two) times daily with a meal. 25 units in the morning and 30 units at bedtime    levothyroxine (SYNTHROID, LEVOTHROID) 50 MCG tablet Take 50 mcg by mouth daily.      Liraglutide (VICTOZA) 18 MG/3ML SOPN Inject 1.8 mg into the skin daily.    metFORMIN (GLUCOPHAGE) 1000 MG tablet Take 1,000 mg by mouth 2 (two) times daily with a meal.    Multiple Vitamin (MULTIVITAMIN WITH MINERALS) TABS tablet Take  0.5 tablets by mouth daily.    potassium chloride SA (K-DUR,KLOR-CON) 20 MEQ tablet Take 20 mEq by mouth every morning.     sertraline (ZOLOFT) 50 MG tablet Take 100 mg by mouth at bedtime.     spironolactone (ALDACTONE) 25 MG tablet Take 1 tablet (25 mg total) by mouth daily. Qty: 90 tablet, Refills: 3    !! torsemide (DEMADEX) 20 MG tablet Take 0.5 tablets (10 mg total) by mouth 2 (two) times daily. Qty: 90 tablet, Refills: 3     !! - Potential duplicate medications found. Please discuss with provider.    STOP taking these medications     doxycycline (VIBRAMYCIN) 100 MG capsule         ALLERGIES:   Allergies  Allergen Reactions  . Celebrex [Celecoxib] Swelling  . Atorvastatin Other (See Comments)  . Cephalexin     REACTION: hives  . Cephalosporins     REACTION: rash  . Lasix [Furosemide] Hives  . Oxycodone-Acetaminophen Nausea Only  . Penicillins Other (See Comments)    Not known  . Oxycodone Anxiety    Felt weird    BRIEF HPI:  See H&P, Labs, Consult and Test reports for all details in brief, 75 y.o. female with history of diastolic CHF, OSA, COPD/asthma presented with shortness of breath and massive anasarca.   CONSULTATIONS:   cardiology  PERTINENT RADIOLOGIC STUDIES: Dg Chest 2 View  08/25/2015  CLINICAL DATA:  Shortness of breath. EXAM: CHEST  2 VIEW COMPARISON:  08/19/2015 FINDINGS: There is marked cardiac enlargement noted. There is no pleural effusion or edema. No airspace opacities identified. IMPRESSION: 1. Cardiac enlargement. 2. No acute findings. Electronically Signed   By: Kerby Moors M.D.   On: 08/25/2015 19:20   Ct Angio Chest Pe W/cm &/or Wo Cm  08/26/2015  CLINICAL DATA:  Pulmonary embolism EXAM: CT ANGIOGRAPHY CHEST WITH CONTRAST TECHNIQUE: Multidetector CT imaging of the chest was performed using the standard protocol during bolus administration of intravenous contrast. Multiplanar CT image reconstructions and MIPs were obtained to evaluate the  vascular anatomy. CONTRAST:  100 mL Omnipaque 300 intravenous COMPARISON:  11/25/2009 FINDINGS: Cardiovascular: There is good opacification of the pulmonary arteries. There is no pulmonary embolism. The thoracic aorta is normal in caliber and intact. Lungs: Mild mosaic attenuation, suggesting air trapping. Mild linear basilar atelectatic changes. Central airways: Patent Effusions: No pleural effusions. There is a moderately large pericardial effusion. Lymphadenopathy: None Esophagus: Unremarkable Upper abdomen: Cirrhotic appearing morphologic irregularities of the liver. Incompletely imaged left adrenal nodule measuring at least 1.4 cm. Musculoskeletal: No significant skeletal lesion. Review of the MIP images confirms the above findings. IMPRESSION: 1. Moderately large pericardial effusion. 2. Negative for acute pulmonary embolism. 3. Incompletely imaged liver, with cirrhotic appearing morphologic irregularities. 4. Left adrenal nodule, only marginally imaged at the bottom of this study. Electronically Signed   By: Shaune Pascal  Alroy Dust M.D.   On: 08/26/2015 03:18   Nm Pulmonary Perf And Vent  08/25/2015  CLINICAL DATA:  History of obstructive sleep apnea. COPD exacerbation. Acute diastolic heart failure. EXAM: NUCLEAR MEDICINE VENTILATION - PERFUSION LUNG SCAN TECHNIQUE: Ventilation images were obtained in multiple projections using inhaled aerosol Tc-14mDTPA. Perfusion images were obtained in multiple projections after intravenous injection of Tc-925mAA. RADIOPHARMACEUTICALS:  4.1 MCi Technetium-9984mPA aerosol inhalation and 4.1 mCi Technetium-30m29m IV COMPARISON:  Chest radiograph from 08/25/2015 FINDINGS: Chest x-ray: Marked cardiac enlargement is identified. The lung volumes appear low. No focal lung opacity identified. Ventilation: Markedly diminished ventilation to both lungs identified. Clumping of the radiopharmaceutical within the central airway is noted which may be a manifestation of PA hypertension.  Perfusion: Large area of relative photopenia corresponding to patient's enlarged cardiac silhouette noted. There is a medium to large size segmental perfusion defect within the lateral aspect of the right lower lobe. Within the left upper lobe there is a large segmental perfusion defects. IMPRESSION: 1. Significantly diminished exam detail due to patient's body habitus, enlarged cardiac silhouette and marked obstructive pulmonary disease. 2. Examination is considered intermediate probability for acute pulmonary embolus. 3. There are the equivalent of 1-1/2 to 2 large segmental perfusion defects identified. The ventilation images are sub optimal to assess for V/Q mismatch. 4. Recommend further evaluation with CT of the chest to assess for presence of acute or chronic pulmonary emboli. Electronically Signed   By: TaylKerby Moors.   On: 08/25/2015 18:51   Dg Chest Port 1 View  08/19/2015  CLINICAL DATA:  74 y65r old female with shortness of breath. History of CHF. EXAM: PORTABLE CHEST 1 VIEW COMPARISON:  Chest radiograph dated 04/13/2010 FINDINGS: Single portable view of the chest demonstrate moderate cardiomegaly. There is prominence of the central vasculature and interstitial markings likely mild congestive changes. Bibasilar hazy atelectatic changes noted. Developing pneumonia is not excluded. There is no pneumothorax. The left costophrenic angle has been excluded from the image. Trace right pleural effusion may be present. No acute osseous pathology identified. IMPRESSION: Cardiomegaly with mild congestive changes.  No focal consolidation. Electronically Signed   By: ArasAnner Crete.   On: 08/19/2015 01:28     PERTINENT LAB RESULTS: CBC:  Recent Labs  08/27/15 0535  WBC 14.8*  HGB 12.5  HCT 40.6  PLT 368   CMET CMP     Component Value Date/Time   NA 140 08/27/2015 0535   K 4.0 08/27/2015 0535   CL 86* 08/27/2015 0535   CO2 43* 08/27/2015 0535   GLUCOSE 118* 08/27/2015 0535   BUN  33* 08/27/2015 0535   CREATININE 0.83 08/27/2015 0535   CALCIUM 9.3 08/27/2015 0535   PROT 7.8 08/24/2015 0629   ALBUMIN 2.9* 08/24/2015 0629   AST 19 08/24/2015 0629   ALT 31 08/24/2015 0629   ALKPHOS 90 08/24/2015 0629   BILITOT 1.0 08/24/2015 0629   GFRNONAA >60 08/27/2015 0535   GFRAA >60 08/27/2015 0535    GFR Estimated Creatinine Clearance: 84.6 mL/min (by C-G formula based on Cr of 0.83). No results for input(s): LIPASE, AMYLASE in the last 72 hours. No results for input(s): CKTOTAL, CKMB, CKMBINDEX, TROPONINI in the last 72 hours. Invalid input(s): POCBNP No results for input(s): DDIMER in the last 72 hours. No results for input(s): HGBA1C in the last 72 hours. No results for input(s): CHOL, HDL, LDLCALC, TRIG, CHOLHDL, LDLDIRECT in the last 72 hours. No results for input(s): TSH, T4TOTAL, T3FREE, THYROIDAB in  the last 72 hours.  Invalid input(s): FREET3 No results for input(s): VITAMINB12, FOLATE, FERRITIN, TIBC, IRON, RETICCTPCT in the last 72 hours. Coags: No results for input(s): INR in the last 72 hours.  Invalid input(s): PT Microbiology: Recent Results (from the past 240 hour(s))  MRSA PCR Screening     Status: None   Collection Time: 08/19/15  4:00 AM  Result Value Ref Range Status   MRSA by PCR NEGATIVE NEGATIVE Final    Comment:        The GeneXpert MRSA Assay (FDA approved for NASAL specimens only), is one component of a comprehensive MRSA colonization surveillance program. It is not intended to diagnose MRSA infection nor to guide or monitor treatment for MRSA infections.        BRIEF HOSPITAL COURSE:  Acute respiratory failure with hypoxia and hypercarbia: Multifactorial etiology, secondary to acute diastolic heart failure, COPD exacerbation and OSA/OSH. Significantly improved with diuretics, bronchodilators, BiPAP. CT angiogram negative for pulmonary embolism, lower extremity Dopplers also negative. Will discharge with home O2.  Active  Problems: Acute diastolic heart failure with severe RV failure: Much more compensated. -4.4 L so far, weight decreased to 331 pounds (363 pounds on admission). 2-D echo shows severely dilated RV with severely reduced RV systolic function(new-not seen on prior Echo) PA pressure of 69. Heart failure likely from increased pulmonary hypertension in the context of obesity and COPD. CTA with moderate-large pericardial effusion, negative for pulmonary embolism.  CHF team consulted, recommendations are to continue Demadex 10 mg twice a day on discharge. Will require close follow-up with the CHF clinic.   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   COPD exacerbation: Much improved, lungs clear on exam without any rhonchi. Completed course of Levaquin, prednisone tapered and discontinued on 4/6. Continue bronchodilators as ordered.  Left Adrenal nodule:seen incidentally on CT angiogram of the chest, stable for outpatient follow-up.  Cirrhotic liver: seen incidentally on CT angiogram of the chest-likely secondary to severe right-sided heart failure, but will require further workup to the outpatient setting. Will defer to PCP.  Moderate pericardial effusion: Seen incidentally on CT angiogram of the chest, confirmed on echocardiogram-no pericardial tamponade features seen. Cardiology, thinks that this may be related to severe pulmonary hypertension, recommendations are to check routine serologies (ANA, RF, ANCA, ESR) to exclude other processes-please follow   Hypokalemia: Repleted-continue potassium supplementation   Hypomagnesemia: Repleted  Constipation:  continue MiraLAX on discharge. Resolved.  Obstructive sleep apnea: Continue BiPAP on discharge .  Type 2 diabetes:  Did have brief hypoglycemic spell on 4/7, but CBGs now much better, continue usual dosing of insulin 70/30 on discharge  Dyslipidemia: Continue  statin  Hypothyroidism: Continue levothyroxine.  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  Morbid obesity: Counseled regarding importance of weight loss   TODAY-DAY OF DISCHARGE:  Subjective:   [name] today has no headache,no chest abdominal pain,no new weakness tingling or numbness, feels much better wants to go home today.   Objective:   Blood pressure 110/64, pulse 92, temperature 97.8 F (36.6 C), temperature source Oral, resp. rate 16, height 5' 1"  (1.549 m), weight 150.186 kg (331 lb 1.6 oz), SpO2 94 %.  Intake/Output Summary (Last 24 hours) at 08/27/15 1044 Last data filed at 08/27/15 0830  Gross per 24 hour  Intake    240 ml  Output      0 ml  Net    240 ml   [employer]  08/25/15 0500 08/26/15 0725 08/27/15 0633  Weight: 153.679 kg (338 lb 12.8 oz) 150.685 kg (332 lb 3.2 oz) 150.186 kg (331 lb 1.6 oz)    Exam Awake Alert, Oriented *3, No new F.N deficits, Normal affect [place].AT,PERRAL Supple Neck,No JVD, No cervical lymphadenopathy appriciated.  Symmetrical Chest wall movement, Good air movement bilaterally, CTAB RRR,No Gallops,Rubs or new Murmurs, No Parasternal Heave +ve B.Sounds, Abd Soft, Non tender, No organomegaly appriciated, No rebound -guarding or rigidity. No Cyanosis, Clubbing or edema, No new Rash or bruise  DISCHARGE CONDITION: Stable  DISPOSITION: Home with home health services  DISCHARGE INSTRUCTIONS:    Activity:  As tolerated with Full fall precautions use walker/cane & assistance as needed  Get Medicines reviewed and adjusted: Please take all your medications with you for your next visit with your Primary MD  Please request your Primary MD to go over all hospital tests and procedure/radiological results at the follow up, please ask your Primary MD to get all Hospital records sent to his/her office.  If you experience worsening of your admission symptoms, develop shortness of breath, life threatening emergency,  suicidal or homicidal thoughts you must seek medical attention immediately by calling 911 or calling your MD immediately  if symptoms less severe.  You must read complete instructions/literature along with all the possible adverse reactions/side effects for all the Medicines you take and that have been prescribed to you. Take any new Medicines after you have completely understood and accpet all the possible adverse reactions/side effects.   Do not drive when taking Pain medications.   Do not take more than prescribed Pain, Sleep and Anxiety Medications  Special Instructions: If you have smoked or chewed Tobacco  in the last 2 yrs please stop smoking, stop any regular Alcohol  and or any Recreational drug use.  Wear Seat belts while driving.  Please note  You were cared for by a hospitalist during your hospital stay. Once you are discharged, your primary care physician will handle any further medical issues. Please note that NO REFILLS for any discharge medications will be authorized once you are discharged, as it is imperative that you return to your primary care physician (or establish a relationship with a primary care physician if you do not have one) for your aftercare needs so that they can reassess your need for medications and monitor your lab values.   Diet recommendation: Diabetic Diet Heart Healthy diet  Discharge Instructions    Diet - low sodium heart healthy    Complete by:  As directed      Diet Carb Modified    Complete by:  As directed      Heart Failure patients record your daily weight using the same scale at the same time of day    Complete by:  As directed      Increase activity slowly    Complete by:  As directed      STOP any activity that causes chest pain, shortness of breath, dizziness, sweating, or exessive weakness    Complete by:  As directed            Follow-up Information    Follow up with [facility].   Why:  Registered Nurse, Physical  Therapy, Aide.    Contact information:   [address] [place] [address] [phone]       Follow up with Shamleffer, Herschell Dimes, MD. Go on 08/31/2015.   Specialty:  Internal Medicine   Why:  Follow up @ 1215   Contact information:   [address] [place] [address] [phone]       Follow up with Bensimhon, Daniel, MD. Schedule an appointment as soon as possible for a visit in 1 week.   Specialty:  Cardiology   Why:  Hospital follow up-at CHF clinic   Contact information:   [place]  [address] [phone]      Total Time spent on discharge equals  45 minutes.  SignedOren Binet 08/27/2015 10:44 AM

## 2015-08-28 ENCOUNTER — Telehealth: Payer: Self-pay | Admitting: Internal Medicine

## 2015-08-28 DIAGNOSIS — Z794 Long term (current) use of insulin: Secondary | ICD-10-CM | POA: Diagnosis not present

## 2015-08-28 DIAGNOSIS — I110 Hypertensive heart disease with heart failure: Secondary | ICD-10-CM | POA: Diagnosis not present

## 2015-08-28 DIAGNOSIS — J9612 Chronic respiratory failure with hypercapnia: Secondary | ICD-10-CM | POA: Diagnosis not present

## 2015-08-28 DIAGNOSIS — E785 Hyperlipidemia, unspecified: Secondary | ICD-10-CM | POA: Diagnosis not present

## 2015-08-28 DIAGNOSIS — E1165 Type 2 diabetes mellitus with hyperglycemia: Secondary | ICD-10-CM | POA: Diagnosis not present

## 2015-08-28 DIAGNOSIS — E662 Morbid (severe) obesity with alveolar hypoventilation: Secondary | ICD-10-CM | POA: Diagnosis not present

## 2015-08-28 DIAGNOSIS — J441 Chronic obstructive pulmonary disease with (acute) exacerbation: Secondary | ICD-10-CM | POA: Diagnosis not present

## 2015-08-28 DIAGNOSIS — F320 Major depressive disorder, single episode, mild: Secondary | ICD-10-CM | POA: Diagnosis not present

## 2015-08-28 DIAGNOSIS — Z6841 Body Mass Index (BMI) 40.0 and over, adult: Secondary | ICD-10-CM | POA: Diagnosis not present

## 2015-08-28 DIAGNOSIS — Z7982 Long term (current) use of aspirin: Secondary | ICD-10-CM | POA: Diagnosis not present

## 2015-08-28 DIAGNOSIS — E039 Hypothyroidism, unspecified: Secondary | ICD-10-CM | POA: Diagnosis not present

## 2015-08-28 DIAGNOSIS — J9611 Chronic respiratory failure with hypoxia: Secondary | ICD-10-CM | POA: Diagnosis not present

## 2015-08-28 DIAGNOSIS — I5033 Acute on chronic diastolic (congestive) heart failure: Secondary | ICD-10-CM | POA: Diagnosis not present

## 2015-08-28 LAB — RHEUMATOID FACTOR: Rhuematoid fact SerPl-aCnc: 13.8 IU/mL (ref 0.0–13.9)

## 2015-08-28 NOTE — Telephone Encounter (Signed)
On Call Cardiology  Patient called. Reports numbness in middle of right foot. Has had same in the past. She has HFpEF and diabetes. She was just discharged yesterday. I suspect her foot numbness is due to diabetic neuropathy. She is scheduled to see her provider in 3 days. She was instructed to continue her meds.    Wandra Mannan, MD

## 2015-08-29 LAB — MPO/PR-3 (ANCA) ANTIBODIES
ANCA Proteinase 3: <3.5 U/mL (ref 0.0–3.5)
Myeloperoxidase Abs: <9 U/mL (ref 0.0–9.0)

## 2015-08-30 DIAGNOSIS — E662 Morbid (severe) obesity with alveolar hypoventilation: Secondary | ICD-10-CM | POA: Diagnosis not present

## 2015-08-30 DIAGNOSIS — E785 Hyperlipidemia, unspecified: Secondary | ICD-10-CM | POA: Diagnosis not present

## 2015-08-30 DIAGNOSIS — J9611 Chronic respiratory failure with hypoxia: Secondary | ICD-10-CM | POA: Diagnosis not present

## 2015-08-30 DIAGNOSIS — J441 Chronic obstructive pulmonary disease with (acute) exacerbation: Secondary | ICD-10-CM | POA: Diagnosis not present

## 2015-08-30 DIAGNOSIS — Z7982 Long term (current) use of aspirin: Secondary | ICD-10-CM | POA: Diagnosis not present

## 2015-08-30 DIAGNOSIS — J9612 Chronic respiratory failure with hypercapnia: Secondary | ICD-10-CM | POA: Diagnosis not present

## 2015-08-30 DIAGNOSIS — E1165 Type 2 diabetes mellitus with hyperglycemia: Secondary | ICD-10-CM | POA: Diagnosis not present

## 2015-08-30 DIAGNOSIS — F320 Major depressive disorder, single episode, mild: Secondary | ICD-10-CM | POA: Diagnosis not present

## 2015-08-30 DIAGNOSIS — I5033 Acute on chronic diastolic (congestive) heart failure: Secondary | ICD-10-CM | POA: Diagnosis not present

## 2015-08-30 DIAGNOSIS — E039 Hypothyroidism, unspecified: Secondary | ICD-10-CM | POA: Diagnosis not present

## 2015-08-30 DIAGNOSIS — Z6841 Body Mass Index (BMI) 40.0 and over, adult: Secondary | ICD-10-CM | POA: Diagnosis not present

## 2015-08-30 DIAGNOSIS — Z794 Long term (current) use of insulin: Secondary | ICD-10-CM | POA: Diagnosis not present

## 2015-08-30 DIAGNOSIS — I110 Hypertensive heart disease with heart failure: Secondary | ICD-10-CM | POA: Diagnosis not present

## 2015-08-30 LAB — ANCA TITERS

## 2015-08-31 DIAGNOSIS — K7460 Unspecified cirrhosis of liver: Secondary | ICD-10-CM | POA: Diagnosis not present

## 2015-08-31 DIAGNOSIS — Z09 Encounter for follow-up examination after completed treatment for conditions other than malignant neoplasm: Secondary | ICD-10-CM | POA: Diagnosis not present

## 2015-08-31 DIAGNOSIS — J9601 Acute respiratory failure with hypoxia: Secondary | ICD-10-CM | POA: Diagnosis not present

## 2015-08-31 DIAGNOSIS — I5030 Unspecified diastolic (congestive) heart failure: Secondary | ICD-10-CM | POA: Diagnosis not present

## 2015-08-31 DIAGNOSIS — D3502 Benign neoplasm of left adrenal gland: Secondary | ICD-10-CM | POA: Diagnosis not present

## 2015-08-31 LAB — ANTINUCLEAR ANTIBODIES, IFA: ANTINUCLEAR ANTIBODIES, IFA: POSITIVE — AB

## 2015-08-31 LAB — FANA STAINING PATTERNS

## 2015-09-01 DIAGNOSIS — I5033 Acute on chronic diastolic (congestive) heart failure: Secondary | ICD-10-CM | POA: Diagnosis not present

## 2015-09-01 DIAGNOSIS — Z7982 Long term (current) use of aspirin: Secondary | ICD-10-CM | POA: Diagnosis not present

## 2015-09-01 DIAGNOSIS — Z6841 Body Mass Index (BMI) 40.0 and over, adult: Secondary | ICD-10-CM | POA: Diagnosis not present

## 2015-09-01 DIAGNOSIS — J9612 Chronic respiratory failure with hypercapnia: Secondary | ICD-10-CM | POA: Diagnosis not present

## 2015-09-01 DIAGNOSIS — I110 Hypertensive heart disease with heart failure: Secondary | ICD-10-CM | POA: Diagnosis not present

## 2015-09-01 DIAGNOSIS — J9611 Chronic respiratory failure with hypoxia: Secondary | ICD-10-CM | POA: Diagnosis not present

## 2015-09-01 DIAGNOSIS — Z794 Long term (current) use of insulin: Secondary | ICD-10-CM | POA: Diagnosis not present

## 2015-09-01 DIAGNOSIS — E662 Morbid (severe) obesity with alveolar hypoventilation: Secondary | ICD-10-CM | POA: Diagnosis not present

## 2015-09-01 DIAGNOSIS — E1165 Type 2 diabetes mellitus with hyperglycemia: Secondary | ICD-10-CM | POA: Diagnosis not present

## 2015-09-01 DIAGNOSIS — F320 Major depressive disorder, single episode, mild: Secondary | ICD-10-CM | POA: Diagnosis not present

## 2015-09-01 DIAGNOSIS — E785 Hyperlipidemia, unspecified: Secondary | ICD-10-CM | POA: Diagnosis not present

## 2015-09-01 DIAGNOSIS — E039 Hypothyroidism, unspecified: Secondary | ICD-10-CM | POA: Diagnosis not present

## 2015-09-01 DIAGNOSIS — J441 Chronic obstructive pulmonary disease with (acute) exacerbation: Secondary | ICD-10-CM | POA: Diagnosis not present

## 2015-09-05 ENCOUNTER — Ambulatory Visit (INDEPENDENT_AMBULATORY_CARE_PROVIDER_SITE_OTHER): Payer: Medicare Other | Admitting: Acute Care

## 2015-09-05 ENCOUNTER — Telehealth: Payer: Self-pay | Admitting: Acute Care

## 2015-09-05 ENCOUNTER — Encounter: Payer: Self-pay | Admitting: Acute Care

## 2015-09-05 VITALS — BP 100/62 | HR 96 | Temp 97.8°F | Ht 61.0 in | Wt 334.8 lb

## 2015-09-05 DIAGNOSIS — I272 Other secondary pulmonary hypertension: Secondary | ICD-10-CM | POA: Diagnosis not present

## 2015-09-05 DIAGNOSIS — J9601 Acute respiratory failure with hypoxia: Secondary | ICD-10-CM | POA: Diagnosis not present

## 2015-09-05 DIAGNOSIS — J9602 Acute respiratory failure with hypercapnia: Secondary | ICD-10-CM

## 2015-09-05 NOTE — Patient Instructions (Addendum)
It is nice to meet you today. I am glad you are feeling better. Continue weaning your oxygen as you feel able. Use saline spray for drying of your nasal passages. Continue PT/OT. Continue with your Pro Air rescue medication every 6 hours as needed for SOB and Wheezing. Continue your weights every morning Continue following your instructions for your fluid pill with weight gain. Follow up with Cardiology on Wednesday as is already scheduled. Continue on a low salt diet. Continue wearing BiPap nightly. Will need follow up CT scan for follow up adrenal gland per your Primary Care Doctor.. Follow up appointment with Dr. Elsworth Soho  re: Pulmonary Hypertension You have an appointment with Rexene Edison in August 2017. Please contact office for sooner follow up if symptoms do not improve or worsen or seek emergency care

## 2015-09-05 NOTE — Telephone Encounter (Signed)
Spoke with the pt  I have updated her care team per her request  Nothing further needed

## 2015-09-05 NOTE — Progress Notes (Signed)
Subjective:    Patient ID: Nina Howell, female    DOB: 10-10-1940, 75 y.o.   MRN: [id number]  HPI 75 YO/F, morbidly obese for FU of obstructive sleep apnea/ OHS .  Adm 03/2010 with worsening dyspnea & hypoxia, VQ neg, BNP 103, diuresed, ABG 7.43/63/89/97% on 2 L, discharged on 2 L & Bilevel 14/8  PSG subsequently showed moderate obstructive sleep apnea with AHi 15/h.  Liver hemangioma was noted on imaging. Echo july'11 >> EF 45-50%septal/ inferoseptal hypokinesis Nina Howell)  Download dec-jan'12 >> excellent compliance & control of events on VPAP auto  Lost 100 lbs in 1 yr !  1/13 hysterescopy- dnc and polyp resection performed under spinal anesthesia , uneventful  Significant events/imaging/procedures:  08/19/2015 : Recent admission for acute respiratory failure with hypoxia and hypercarbia thought to be multifactorial combination of COPD/asthma/CHF ( likely 2/2 increased pulmonary hypertension in context of obesity and COPD ). Treated with diuretics, bronchodilators, BiPAP, oxygen, Levaquin, Prednisone  08/19/2015: CXR: Cardiomegaly with mild congestive changes. No focal consolidation.  08/25/2015: 2-D echo shows severely dilated RV with severely reduced RV systolic function(new-not seen on prior Echo)  PA pressure of 69.   08/27/2015:Auto-immune Lab work:  Results for Nina Howell (MRN [id number]) as of 09/06/2015 00:13  Ref. Range 08/27/2015 10:20 08/27/2015 10:30  ANA Ab, IFA Unknown Positive (A)   ANCA Proteinase 3 Latest Ref Range: 0.0-3.5 U/mL  <3.5  Myeloperoxidase Abs Latest Ref Range: 0.0-9.0 U/mL  <9.0  RA Latex Turbid. Latest Ref Range: 0.0-13.9 IU/mL  13.8  Cytoplasmic (C-ANCA) Latest Ref Range: Neg:<1:20 titer  <1:20  P-ANCA Latest Ref Range: Neg:<1:20 titer  <1:20  Atypical P-ANCA titer Latest Ref Range: Neg:<1:20 titer  <1:20  Speckled Pattern Unknown 1:160 (H)     Admit Date: 08/19/2015 Discharge date: 08/27/2015  Recommendations for Outpatient  Follow-up:  1. ANA, rheumatoid factor, pancreatitis, sedimentation rate-ordered by cardiology on day of discharge-will need to be followed.  2. Please repeat CBC/BMET at next visit 3. Been discharged on oxygen. 4. Left adrenal nodule-seen incidentally on CT angiogram of the chest, stable for outpatient follow-up. 5. 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 Will defer to PCP.  PRIMARY DISCHARGE DIAGNOSIS: Principal Problem:  Acute respiratory failure with hypoxia and hypercarbia (HCC) Active Problems:  Obstructive sleep apnea  Essential hypertension  Congestive heart failure (HCC)  COPD exacerbation (HCC)  Acute respiratory failure (HCC)  Acute on chronic congestive heart failure ([place])     09/05/2015:Hospital Follow Up:  Patient presents to the office today stating that she is much better. Weight is down 20+ pounds with diuresis. She is weighing herself daily and following instructions per cardiology on additional Lasix dosing based on weight fluctuation. She is working on weaning oxygen based on her dyspnea both at rest and with exertion. She is using a saturation monitor to guide her. She denies fever, shortness of breath, chest pain, cough, purulent secretions, leg or calf pain. She has a follow-up Appointment with heart failure clinic this Wednesday 09/07/2015, where she will get a repeat CBC and BMET. Last chest x-ray prior to discharge on 08/25/2015 showed cardiac enlargement, with no acute findings. She states she is compliant with her BiPAP machine nightly.   Current outpatient prescriptions:  .  albuterol (PROVENTIL HFA;VENTOLIN HFA) 108 (90 Base) MCG/ACT inhaler, Inhale 2 puffs into the lungs every 4 (four) hours as needed for wheezing or shortness of  breath., Disp: , Rfl:  .  aspirin 81 MG tablet, Take 81 mg by mouth daily., Disp: ,  Rfl:  .  atorvastatin (LIPITOR) 20 MG tablet, Take 20 mg by mouth every other day., Disp: , Rfl:  .  beclomethasone (QVAR) 80 MCG/ACT inhaler, Inhale 2 puffs into the lungs 2 (two) times daily., Disp: , Rfl:  .  Cholecalciferol (VITAMIN D3) 1000 UNITS CAPS, Take 1 tablet by mouth daily.  , Disp: , Rfl:  .  CRANBERRY PO, Take 3 tablets by mouth daily. , Disp: , Rfl:  .  Cyanocobalamin (VITAMIN B-12) 2500 MCG SUBL, Place 1 tablet under the tongue daily., Disp: , Rfl:  .  fluocinonide (LIDEX) 0.05 % external solution, Apply 1 application topically 2 (two) times daily as needed (dermatosis). , Disp: , Rfl:  .  insulin NPH-regular Human (NOVOLIN 70/30) (70-30) 100 UNIT/ML injection, Inject 25-30 Units into the skin 2 (two) times daily with a meal. 25 units in the morning and 30 units at bedtime, Disp: , Rfl:  .  levothyroxine (SYNTHROID, LEVOTHROID) 50 MCG tablet, Take 50 mcg by mouth daily.  , Disp: , Rfl:  .  Liraglutide (VICTOZA) 18 MG/3ML SOPN, Inject 1.8 mg into the skin daily., Disp: , Rfl:  .  metFORMIN (GLUCOPHAGE) 1000 MG tablet, Take 1,000 mg by mouth 2 (two) times daily with a meal., Disp: , Rfl:  .  Multiple Vitamin (MULTIVITAMIN WITH MINERALS) TABS tablet, Take 0.5 tablets by mouth daily., Disp: , Rfl:  .  polyethylene glycol (MIRALAX / GLYCOLAX) packet, Take 17 g by mouth daily as needed., Disp: 14 each, Rfl: 0 .  potassium chloride SA (K-DUR,KLOR-CON) 20 MEQ tablet, Take 20 mEq by mouth every morning. , Disp: , Rfl:  .  sertraline (ZOLOFT) 50 MG tablet, Take 100 mg by mouth at bedtime. , Disp: , Rfl:  .  spironolactone (ALDACTONE) 25 MG tablet, Take 1 tablet (25 mg total) by mouth daily., Disp: 90 tablet, Rfl: 3 .  torsemide (DEMADEX) 20 MG tablet, Take 0.5 tablets (10 mg total) by mouth 2 (two) times daily., Disp: 90 tablet, Rfl: 3   Past Medical History  Diagnosis Date  . Cellulitis     ABDOMINAL WALL  . CHF (congestive heart failure) (HCC)     DECOMPENSATED SYSTOLIC CHF  .  Hypoventilation   . Hypokalemia   . CHF (congestive heart failure) ([place])   . Diabetes mellitus, type 2 ([place])   . Hypothyroidism   . Varicose veins   . Morbid obesity ([place])   . IUD (intrauterine device) in place   . DJD (degenerative joint disease)   . Hypertension   . Anemia   . Hepatic lesion   . Asymptomatic cholelithiasis   . Hyperlipidemia   . CTS (carpal tunnel syndrome)   . 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 Acute pancreatitis   . Sleep apnea     cpap  . Rash and nonspecific skin eruption     all over- 12-17-14 "drying in _ Auto immune problem"-seeing dermalogist    Allergies  Allergen Reactions  . Celebrex [Celecoxib] Swelling  . Atorvastatin Other (See Comments)  . Cephalexin     REACTION: hives  . Cephalosporins     REACTION: rash  . Lasix [Furosemide] Hives  . Oxycodone-Acetaminophen Nausea Only  . Penicillins Other (See Comments)    Not known  . Oxycodone Anxiety    Felt weird    . Review of Systems Constitutional:   No  weight loss, night sweats,  Fevers, chills, fatigue, or  lassitude.  HEENT:   No headaches,  Difficulty swallowing,  Tooth/dental problems, or  Sore throat,                No sneezing, itching, ear ache, nasal congestion, post nasal drip,   CV:  No chest pain,  Orthopnea, PND, swelling in lower extremities, anasarca, dizziness, palpitations, syncope.   GI  No heartburn, indigestion, abdominal pain, nausea, vomiting, diarrhea, change in bowel habits, loss of appetite, bloody stools.   Resp: Improving shortness of breath with exertion and at rest.  No excess mucus, no productive cough,  No non-productive cough,  No coughing up of blood.  No change in color of mucus.  No wheezing.  No chest wall deformity  Skin: no rash or lesions.  GU: no dysuria, change in color of urine, no urgency or frequency.  No flank pain, no hematuria   MS:  No joint pain or swelling.  No decreased range of motion.  No back pain.  Psych:  No  change in mood or affect. No depression or anxiety.  No memory loss.        Objective:   Physical Exam BP 100/62 mmHg  Pulse 96  Temp(Src) 97.8 F (36.6 C) (Oral)  Ht 5\' 1"  (1.549 m)  Wt 334 lb 12.8 oz (151.864 kg)  BMI 63.29 kg/m2  SpO2 97%   Physical Exam:  General- No distress,  A&Ox3, obese in wheel chair ENT: No sinus tenderness, TM clear, pale nasal mucosa, no oral exudate,no post nasal drip, no LAN Cardiac: S1, S2, regular rate and rhythm, no murmur, no JVD Chest: No wheeze/ rales/ dullness; no accessory muscle use, no nasal flaring, no sternal retractions Abd.: Soft Non-tender Ext: No clubbing cyanosis, trace edema Neuro:  normal strength Skin: No rashes, warm and dry Psych: normal mood and behavior  Magdalen Spatz, AGACNP-BC [facility] 09/06/2015      Assessment & Plan:

## 2015-09-06 DIAGNOSIS — E785 Hyperlipidemia, unspecified: Secondary | ICD-10-CM | POA: Diagnosis not present

## 2015-09-06 DIAGNOSIS — F320 Major depressive disorder, single episode, mild: Secondary | ICD-10-CM | POA: Diagnosis not present

## 2015-09-06 DIAGNOSIS — E039 Hypothyroidism, unspecified: Secondary | ICD-10-CM | POA: Diagnosis not present

## 2015-09-06 DIAGNOSIS — E662 Morbid (severe) obesity with alveolar hypoventilation: Secondary | ICD-10-CM | POA: Diagnosis not present

## 2015-09-06 DIAGNOSIS — E1165 Type 2 diabetes mellitus with hyperglycemia: Secondary | ICD-10-CM | POA: Diagnosis not present

## 2015-09-06 DIAGNOSIS — J9611 Chronic respiratory failure with hypoxia: Secondary | ICD-10-CM | POA: Diagnosis not present

## 2015-09-06 DIAGNOSIS — Z7982 Long term (current) use of aspirin: Secondary | ICD-10-CM | POA: Diagnosis not present

## 2015-09-06 DIAGNOSIS — J441 Chronic obstructive pulmonary disease with (acute) exacerbation: Secondary | ICD-10-CM | POA: Diagnosis not present

## 2015-09-06 DIAGNOSIS — I110 Hypertensive heart disease with heart failure: Secondary | ICD-10-CM | POA: Diagnosis not present

## 2015-09-06 DIAGNOSIS — I5033 Acute on chronic diastolic (congestive) heart failure: Secondary | ICD-10-CM | POA: Diagnosis not present

## 2015-09-06 DIAGNOSIS — I272 Pulmonary hypertension, unspecified: Secondary | ICD-10-CM | POA: Insufficient documentation

## 2015-09-06 DIAGNOSIS — Z794 Long term (current) use of insulin: Secondary | ICD-10-CM | POA: Diagnosis not present

## 2015-09-06 DIAGNOSIS — J9612 Chronic respiratory failure with hypercapnia: Secondary | ICD-10-CM | POA: Diagnosis not present

## 2015-09-06 DIAGNOSIS — Z6841 Body Mass Index (BMI) 40.0 and over, adult: Secondary | ICD-10-CM | POA: Diagnosis not present

## 2015-09-06 NOTE — Assessment & Plan Note (Signed)
08/19/2015 : Recent admission for acute respiratory failure with hypoxia and hypercarbia thought to be multifactorial combination of COPD/asthma/CHF .

## 2015-09-06 NOTE — Assessment & Plan Note (Signed)
New diagnosis of pulmonary hypertension.  2-D echo done 08/25/2015 shows PA pressure of 69 Plan: Follow-up appointment with Dr. Elsworth Soho regarding new diagnosis of pulmonary hypertension

## 2015-09-06 NOTE — Assessment & Plan Note (Signed)
08/19/2015 : Recent admission for acute respiratory failure with hypoxia and hypercarbia thought to be multifactorial combination of COPD/asthma/CHF . Plan: Continue weaning your oxygen as you feel able. Use saline spray for drying of your nasal passages. Continue PT/OT. Continue with your Pro Air rescue medication every 6 hours as needed for SOB and Wheezing. Continue your weights every morning Continue following your instructions for your fluid pill with weight gain. Follow up with Cardiology on Wednesday as is already scheduled. Continue on a low salt diet. Will need follow up CT scan for follow up adrenal gland per your Primary Care Doctor. Continue using BIPAP every night . Follow up appointment with Dr. Chase Caller re: pulmonary hypertension You have an appointment with Rexene Edison in August 2017. Please contact office for sooner follow up if symptoms do not improve or worsen or seek emergency care

## 2015-09-06 NOTE — Progress Notes (Signed)
Patient ID: Nina Howell, female   DOB: [birth year], 75 y.o.   MRN: [id number]    Advanced Heart Failure Clinic Note   Primary Care: Dr. Baird Cancer Pulmonologist: Dr. Elsworth Soho Primary Cardiologist: New (Dr. Haroldine Laws)  HPI:  Nina Howell is a 75 y.o. female with history of diastolic CHF, OSA, DM, morbid obesity, COPD/asthma presents to the ER because of increasing shortness of breath.  Sees Dr Elsworth Soho for morbid obesity, OHS and OSA on CPAP has been encouraged to lose weight. Previously seen by Dr [name]. Echo 2011 done for dyspnea showed estimated PA pressure 56 mmHg and EF 45-50%. Underwent cath for abnormal ECG and nuclear study. Normal coronaries 2011. Follows with Dr. Johnsie Cancel now.   Recently treated with prednisone and cellcept for bullous pemphigoid  Over the last 3 weeks she had worsening dyspnea. PCP started antibiotics with little relief. She called EMS due to worsening dyspnea. Placed on short term bipap. Placed on antibiotics, soludmedrol, nebulizers and demadex.  She has been diuresed with demadex 60 bid (takes 10 bid at home). Overall diuresed 30 pounds. On ECHO RV severely dilated and LV EF 50-55%. With RVSP 69. Moderate pericardial effusion Feels back to baseline. Says at home she is able to do all ADLs and go to store without too much problem. Wears CPAP routinely. Renal function stable. No CP.   VQ 08/25/15: Limited study due to body habitus. Possible 2 perfusion defects -> recommend chest CT CT 08/26/15: -> No PE Moderate to large pericardial effusion  She presents today for post hospital follow up and to establish with the HF clinic. Weight at home was up from 332.6 to 336.0. Took extra torsemide ( 20 mg BID) and went back down to 333. States her breathing has been really good since she left in the hospital. Has been following all of her instructions and taking medications as directed.  Taking daily weights.    Past Medical History  Diagnosis Date  . Cellulitis    ABDOMINAL WALL  . CHF (congestive heart failure) (HCC)     DECOMPENSATED SYSTOLIC CHF  . Hypoventilation   . Hypokalemia   . CHF (congestive heart failure) ([place])   . Diabetes mellitus, type 2 ([place])   . Hypothyroidism   . Varicose veins   . Morbid obesity ([place])   . IUD (intrauterine device) in place   . DJD (degenerative joint disease)   . Hypertension   . Anemia   . Hepatic lesion   . Asymptomatic cholelithiasis   . Hyperlipidemia   . CTS (carpal tunnel syndrome)   . 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 Acute pancreatitis   . Sleep apnea     cpap  . Rash and nonspecific skin eruption     all over- 12-17-14 "drying in _ Auto immune problem"-seeing dermalogist    Current Outpatient Prescriptions  Medication Sig Dispense Refill  . albuterol (PROVENTIL HFA;VENTOLIN HFA) 108 (90 Base) MCG/ACT inhaler Inhale 2 puffs into the lungs every 4 (four) hours as needed for wheezing or shortness of breath.    Marland Kitchen aspirin 81 MG tablet Take 81 mg by mouth daily.    Marland Kitchen atorvastatin (LIPITOR) 20 MG tablet Take 20 mg by mouth every other day.    . beclomethasone (QVAR) 80 MCG/ACT inhaler Inhale 2 puffs into the lungs 2 (two) times daily.    . Cholecalciferol (VITAMIN D3) 1000 UNITS CAPS Take 1 tablet by mouth daily.      Marland Kitchen CRANBERRY PO Take 3 tablets by  mouth daily.     . Cyanocobalamin (VITAMIN B-12) 2500 MCG SUBL Place 1 tablet under the tongue daily.    . fluocinonide (LIDEX) 0.05 % external solution Apply 1 application topically 2 (two) times daily as needed (dermatosis).     . insulin NPH-regular Human (NOVOLIN 70/30) (70-30) 100 UNIT/ML injection Inject 25-30 Units into the skin 2 (two) times daily with a meal. 25 units in the morning and 30 units at bedtime    . levothyroxine (SYNTHROID, LEVOTHROID) 50 MCG tablet Take 50 mcg by mouth daily.      . Liraglutide (VICTOZA) 18 MG/3ML SOPN Inject 1.8 mg into the skin daily.    . metFORMIN (GLUCOPHAGE) 1000 MG tablet Take 1,000 mg by  mouth 2 (two) times daily with a meal.    . Multiple Vitamin (MULTIVITAMIN WITH MINERALS) TABS tablet Take 0.5 tablets by mouth daily.    . polyethylene glycol (MIRALAX / GLYCOLAX) packet Take 17 g by mouth daily as needed. 14 each 0  . potassium chloride SA (K-DUR,KLOR-CON) 20 MEQ tablet Take 20 mEq by mouth every morning.     . sertraline (ZOLOFT) 50 MG tablet Take 100 mg by mouth at bedtime.     Marland Kitchen spironolactone (ALDACTONE) 25 MG tablet Take 1 tablet (25 mg total) by mouth daily. 90 tablet 3  . torsemide (DEMADEX) 20 MG tablet Take 0.5 tablets (10 mg total) by mouth 2 (two) times daily. 90 tablet 3   No current facility-administered medications for this encounter.    Allergies  Allergen Reactions  . Celebrex [Celecoxib] Swelling  . Atorvastatin Other (See Comments)  . Cephalexin     REACTION: hives  . Cephalosporins     REACTION: rash  . Lasix [Furosemide] Hives  . Oxycodone-Acetaminophen Nausea Only  . Penicillins Other (See Comments)    Not known  . Oxycodone Anxiety    Felt weird      Social History   Social History  . Marital Status: Married    Spouse Name: N/A  . Number of Children: 4  . Years of Education: N/A   Occupational History  . retired     Education officer, museum   Social History Main Topics  . Smoking status: Never Smoker   . Smokeless tobacco: Never Used  . Alcohol Use: No  . Drug Use: No  . Sexual Activity: Yes    Birth Control/ Protection: IUD   Other Topics Concern  . Not on file   Social History Narrative      Family History  Problem Relation Age of Onset  . Cancer Mother     throat  . Diabetes Father   . Leukemia Father     Filed Vitals:   09/07/15 1332  BP: 120/86  Pulse: 88  Weight: 335 lb 12.8 oz (152.318 kg)  SpO2: 96%     Wt Readings from Last 3 Encounters:  09/07/15 335 lb 12.8 oz (152.318 kg)  09/05/15 334 lb 12.8 oz (151.864 kg)  08/27/15 331 lb 1.6 oz (150.186 kg)     PHYSICAL EXAM: General:  Elderly appearing,  entered clinic in [facility]. Husband present.  HEENT: normal Neck: supple. JVD difficult to see. Carotids 2+ bilat; no bruits. No thyromegaly or nodule noted Cor: PMI nondisplaced. Regular rate & rhythm. No rubs, gallops or murmurs appreciated Lungs: clear Abdomen: soft, NT, ND, no HSM. No bruits or masses. +BS  Extremities: no cyanosis, clubbing, rash, edema Neuro: alert & oriented x 3, cranial nerves grossly intact. moves all  4 extremities w/o difficulty. Affect pleasant.   ASSESSMENT & PLAN:  1. Chronic diastolic HF - Echo 11/22/41 LVEF 50-55%, Grade 1 DD, RV severely dilated and severely reduced. - Down 30+ pounds in hospital -Increase torsemide to 20 mg BID.  Can take an additional 20 mg as needed for weight gain.  2. 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  - VQ scan was indeterminate but Chest CT not suggestive of chronic PE, LE dopplers negative.  -  ANA 1:80 Positive, RF, P-ANCA negative,  ESR 35. Likely not clinically significant.  3. Pericardial effusion, moderate to large - recheck echo to see if better with diuresis.   4. Morbid obesity - She is aware that weight is a major contributing factor to her symptoms. Encouraged to lose weight and increase activity as able.  5. Abnormal ECG with inferior Q waves 6. Obesity hypoventilation syndrome - Continued to counsel on weight loss.  7. OSA on CPAP - Continue CPAP and supplemental 02 to maintain sats > 90%.   BMET next week. Med changes as above.     4 week follow up PA/NP side.  Legrand Como [address]" [place], PA-C 09/07/2015 1:55 PM   Patient seen and examined with Oda Kilts, PA-C. We discussed all aspects of the encounter. I agree with the assessment and plan as stated above.   Volume status improved. Will increase demadex to 20 bid. Repeat echo to reassess pulmonary pressures and pericardial effusion. Check BMET next week. Continue CPAP.  Bensimhon,  Daniel,MD 3:54 PM

## 2015-09-07 ENCOUNTER — Ambulatory Visit (HOSPITAL_COMMUNITY)
Admission: RE | Admit: 2015-09-07 | Discharge: 2015-09-07 | Disposition: A | Discharge status: Home / Self Care | Payer: BLUE CROSS/BLUE SHIELD | Source: Ambulatory Visit | Attending: Internal Medicine | Admitting: Internal Medicine

## 2015-09-07 VITALS — BP 120/86 | HR 88 | Wt 335.8 lb

## 2015-09-07 DIAGNOSIS — E662 Morbid (severe) obesity with alveolar hypoventilation: Secondary | ICD-10-CM | POA: Diagnosis not present

## 2015-09-07 DIAGNOSIS — J449 Chronic obstructive pulmonary disease, unspecified: Secondary | ICD-10-CM | POA: Insufficient documentation

## 2015-09-07 DIAGNOSIS — G4733 Obstructive sleep apnea (adult) (pediatric): Secondary | ICD-10-CM | POA: Insufficient documentation

## 2015-09-07 DIAGNOSIS — I313 Pericardial effusion (noninflammatory): Secondary | ICD-10-CM | POA: Diagnosis not present

## 2015-09-07 DIAGNOSIS — I110 Hypertensive heart disease with heart failure: Secondary | ICD-10-CM | POA: Diagnosis not present

## 2015-09-07 DIAGNOSIS — Z881 Allergy status to other antibiotic agents status: Secondary | ICD-10-CM | POA: Diagnosis not present

## 2015-09-07 DIAGNOSIS — R9431 Abnormal electrocardiogram [ECG] [EKG]: Secondary | ICD-10-CM | POA: Diagnosis not present

## 2015-09-07 DIAGNOSIS — E039 Hypothyroidism, unspecified: Secondary | ICD-10-CM | POA: Diagnosis not present

## 2015-09-07 DIAGNOSIS — E785 Hyperlipidemia, unspecified: Secondary | ICD-10-CM | POA: Diagnosis not present

## 2015-09-07 DIAGNOSIS — Z79899 Other long term (current) drug therapy: Secondary | ICD-10-CM | POA: Insufficient documentation

## 2015-09-07 DIAGNOSIS — I272 Other secondary pulmonary hypertension: Secondary | ICD-10-CM

## 2015-09-07 DIAGNOSIS — E119 Type 2 diabetes mellitus without complications: Secondary | ICD-10-CM | POA: Diagnosis not present

## 2015-09-07 DIAGNOSIS — I5032 Chronic diastolic (congestive) heart failure: Secondary | ICD-10-CM | POA: Diagnosis not present

## 2015-09-07 DIAGNOSIS — Z885 Allergy status to narcotic agent status: Secondary | ICD-10-CM | POA: Insufficient documentation

## 2015-09-07 DIAGNOSIS — Z880 Allergy status to penicillin: Secondary | ICD-10-CM | POA: Diagnosis not present

## 2015-09-07 DIAGNOSIS — Z794 Long term (current) use of insulin: Secondary | ICD-10-CM | POA: Insufficient documentation

## 2015-09-07 DIAGNOSIS — Z888 Allergy status to other drugs, medicaments and biological substances status: Secondary | ICD-10-CM | POA: Insufficient documentation

## 2015-09-07 DIAGNOSIS — Z7982 Long term (current) use of aspirin: Secondary | ICD-10-CM | POA: Diagnosis not present

## 2015-09-07 DIAGNOSIS — Z833 Family history of diabetes mellitus: Secondary | ICD-10-CM | POA: Insufficient documentation

## 2015-09-07 MED ORDER — TORSEMIDE 20 MG PO TABS: 20.0000 mg | ORAL_TABLET | Freq: Two times a day (BID) | ORAL | Status: DC

## 2015-09-07 NOTE — Patient Instructions (Signed)
INCREASE Torsemide to 20 mg, one pill twice a day You may add an additional 20 mg for weight gain, with an every extra dose of torsemide make to take an additional 20 meq of potassium  Labs needed next week  Your physician recommends that you schedule a follow-up appointment in: 4 weeks In the [facility]

## 2015-09-08 ENCOUNTER — Telehealth (HOSPITAL_COMMUNITY): Payer: Self-pay | Admitting: Vascular Surgery

## 2015-09-08 DIAGNOSIS — I110 Hypertensive heart disease with heart failure: Secondary | ICD-10-CM | POA: Diagnosis not present

## 2015-09-08 DIAGNOSIS — F320 Major depressive disorder, single episode, mild: Secondary | ICD-10-CM | POA: Diagnosis not present

## 2015-09-08 DIAGNOSIS — Z794 Long term (current) use of insulin: Secondary | ICD-10-CM | POA: Diagnosis not present

## 2015-09-08 DIAGNOSIS — Z7982 Long term (current) use of aspirin: Secondary | ICD-10-CM | POA: Diagnosis not present

## 2015-09-08 DIAGNOSIS — J9611 Chronic respiratory failure with hypoxia: Secondary | ICD-10-CM | POA: Diagnosis not present

## 2015-09-08 DIAGNOSIS — J9612 Chronic respiratory failure with hypercapnia: Secondary | ICD-10-CM | POA: Diagnosis not present

## 2015-09-08 DIAGNOSIS — E662 Morbid (severe) obesity with alveolar hypoventilation: Secondary | ICD-10-CM | POA: Diagnosis not present

## 2015-09-08 DIAGNOSIS — E039 Hypothyroidism, unspecified: Secondary | ICD-10-CM | POA: Diagnosis not present

## 2015-09-08 DIAGNOSIS — E785 Hyperlipidemia, unspecified: Secondary | ICD-10-CM | POA: Diagnosis not present

## 2015-09-08 DIAGNOSIS — J441 Chronic obstructive pulmonary disease with (acute) exacerbation: Secondary | ICD-10-CM | POA: Diagnosis not present

## 2015-09-08 DIAGNOSIS — I5033 Acute on chronic diastolic (congestive) heart failure: Secondary | ICD-10-CM | POA: Diagnosis not present

## 2015-09-08 DIAGNOSIS — E1165 Type 2 diabetes mellitus with hyperglycemia: Secondary | ICD-10-CM | POA: Diagnosis not present

## 2015-09-08 DIAGNOSIS — Z6841 Body Mass Index (BMI) 40.0 and over, adult: Secondary | ICD-10-CM | POA: Diagnosis not present

## 2015-09-08 NOTE — Telephone Encounter (Signed)
Pt called she would like her lab work to be done at home, home health states that labs could be done at home please fax order [phone] please advise

## 2015-09-12 ENCOUNTER — Encounter: Payer: Self-pay | Admitting: Pulmonary Disease

## 2015-09-12 ENCOUNTER — Telehealth: Payer: Self-pay | Admitting: Pulmonary Disease

## 2015-09-12 DIAGNOSIS — K7460 Unspecified cirrhosis of liver: Secondary | ICD-10-CM | POA: Diagnosis not present

## 2015-09-12 DIAGNOSIS — R933 Abnormal findings on diagnostic imaging of other parts of digestive tract: Secondary | ICD-10-CM | POA: Diagnosis not present

## 2015-09-12 DIAGNOSIS — K760 Fatty (change of) liver, not elsewhere classified: Secondary | ICD-10-CM | POA: Diagnosis not present

## 2015-09-12 NOTE — Telephone Encounter (Signed)
ok 

## 2015-09-12 NOTE — Telephone Encounter (Signed)
Called patient to schedule appointment with Dr. Elsworth Soho for follow up of new diagnosis of Pulmonary Hypertension.  Patient refused appointment at this time and says that she will just keep her appointment with Tammy in August.  She said that she has been seeing too many doctors lately and she needs a break from seeing physicians for a while.  She said that she will call our office if she needs too, and she said that she understands that she has a new diagnosis but she "lives in a 2 story house and she has to climb downstairs every time she goes to an appointment, and she is tired of climbing stairs".     FYI to Dr. Elsworth Soho

## 2015-09-13 ENCOUNTER — Telehealth (HOSPITAL_COMMUNITY): Payer: Self-pay | Admitting: *Deleted

## 2015-09-13 DIAGNOSIS — F320 Major depressive disorder, single episode, mild: Secondary | ICD-10-CM | POA: Diagnosis not present

## 2015-09-13 DIAGNOSIS — E1165 Type 2 diabetes mellitus with hyperglycemia: Secondary | ICD-10-CM | POA: Diagnosis not present

## 2015-09-13 DIAGNOSIS — I5033 Acute on chronic diastolic (congestive) heart failure: Secondary | ICD-10-CM | POA: Diagnosis not present

## 2015-09-13 DIAGNOSIS — Z6841 Body Mass Index (BMI) 40.0 and over, adult: Secondary | ICD-10-CM | POA: Diagnosis not present

## 2015-09-13 DIAGNOSIS — E785 Hyperlipidemia, unspecified: Secondary | ICD-10-CM | POA: Diagnosis not present

## 2015-09-13 DIAGNOSIS — J441 Chronic obstructive pulmonary disease with (acute) exacerbation: Secondary | ICD-10-CM | POA: Diagnosis not present

## 2015-09-13 DIAGNOSIS — J9611 Chronic respiratory failure with hypoxia: Secondary | ICD-10-CM | POA: Diagnosis not present

## 2015-09-13 DIAGNOSIS — Z794 Long term (current) use of insulin: Secondary | ICD-10-CM | POA: Diagnosis not present

## 2015-09-13 DIAGNOSIS — Z7982 Long term (current) use of aspirin: Secondary | ICD-10-CM | POA: Diagnosis not present

## 2015-09-13 DIAGNOSIS — E662 Morbid (severe) obesity with alveolar hypoventilation: Secondary | ICD-10-CM | POA: Diagnosis not present

## 2015-09-13 DIAGNOSIS — I110 Hypertensive heart disease with heart failure: Secondary | ICD-10-CM | POA: Diagnosis not present

## 2015-09-13 DIAGNOSIS — J9612 Chronic respiratory failure with hypercapnia: Secondary | ICD-10-CM | POA: Diagnosis not present

## 2015-09-13 DIAGNOSIS — E039 Hypothyroidism, unspecified: Secondary | ICD-10-CM | POA: Diagnosis not present

## 2015-09-13 NOTE — Telephone Encounter (Signed)
Magda Paganini, RN stated that pt told her she needed labs this week but she has not received order, per last OV note pt needs bmet this week, gave her VO she will get this week

## 2015-09-15 DIAGNOSIS — J9611 Chronic respiratory failure with hypoxia: Secondary | ICD-10-CM | POA: Diagnosis not present

## 2015-09-15 DIAGNOSIS — I5033 Acute on chronic diastolic (congestive) heart failure: Secondary | ICD-10-CM | POA: Diagnosis not present

## 2015-09-15 DIAGNOSIS — E039 Hypothyroidism, unspecified: Secondary | ICD-10-CM | POA: Diagnosis not present

## 2015-09-15 DIAGNOSIS — I110 Hypertensive heart disease with heart failure: Secondary | ICD-10-CM | POA: Diagnosis not present

## 2015-09-15 DIAGNOSIS — F320 Major depressive disorder, single episode, mild: Secondary | ICD-10-CM | POA: Diagnosis not present

## 2015-09-15 DIAGNOSIS — J441 Chronic obstructive pulmonary disease with (acute) exacerbation: Secondary | ICD-10-CM | POA: Diagnosis not present

## 2015-09-15 DIAGNOSIS — Z7982 Long term (current) use of aspirin: Secondary | ICD-10-CM | POA: Diagnosis not present

## 2015-09-15 DIAGNOSIS — I5032 Chronic diastolic (congestive) heart failure: Secondary | ICD-10-CM | POA: Diagnosis not present

## 2015-09-15 DIAGNOSIS — J9612 Chronic respiratory failure with hypercapnia: Secondary | ICD-10-CM | POA: Diagnosis not present

## 2015-09-15 DIAGNOSIS — E1165 Type 2 diabetes mellitus with hyperglycemia: Secondary | ICD-10-CM | POA: Diagnosis not present

## 2015-09-15 DIAGNOSIS — E785 Hyperlipidemia, unspecified: Secondary | ICD-10-CM | POA: Diagnosis not present

## 2015-09-15 DIAGNOSIS — E662 Morbid (severe) obesity with alveolar hypoventilation: Secondary | ICD-10-CM | POA: Diagnosis not present

## 2015-09-15 DIAGNOSIS — Z794 Long term (current) use of insulin: Secondary | ICD-10-CM | POA: Diagnosis not present

## 2015-09-15 DIAGNOSIS — Z6841 Body Mass Index (BMI) 40.0 and over, adult: Secondary | ICD-10-CM | POA: Diagnosis not present

## 2015-09-20 DIAGNOSIS — F320 Major depressive disorder, single episode, mild: Secondary | ICD-10-CM | POA: Diagnosis not present

## 2015-09-20 DIAGNOSIS — E039 Hypothyroidism, unspecified: Secondary | ICD-10-CM | POA: Diagnosis not present

## 2015-09-20 DIAGNOSIS — Z7982 Long term (current) use of aspirin: Secondary | ICD-10-CM | POA: Diagnosis not present

## 2015-09-20 DIAGNOSIS — E1165 Type 2 diabetes mellitus with hyperglycemia: Secondary | ICD-10-CM | POA: Diagnosis not present

## 2015-09-20 DIAGNOSIS — I5033 Acute on chronic diastolic (congestive) heart failure: Secondary | ICD-10-CM | POA: Diagnosis not present

## 2015-09-20 DIAGNOSIS — E785 Hyperlipidemia, unspecified: Secondary | ICD-10-CM | POA: Diagnosis not present

## 2015-09-20 DIAGNOSIS — I110 Hypertensive heart disease with heart failure: Secondary | ICD-10-CM | POA: Diagnosis not present

## 2015-09-20 DIAGNOSIS — Z6841 Body Mass Index (BMI) 40.0 and over, adult: Secondary | ICD-10-CM | POA: Diagnosis not present

## 2015-09-20 DIAGNOSIS — J441 Chronic obstructive pulmonary disease with (acute) exacerbation: Secondary | ICD-10-CM | POA: Diagnosis not present

## 2015-09-20 DIAGNOSIS — J9611 Chronic respiratory failure with hypoxia: Secondary | ICD-10-CM | POA: Diagnosis not present

## 2015-09-20 DIAGNOSIS — J9612 Chronic respiratory failure with hypercapnia: Secondary | ICD-10-CM | POA: Diagnosis not present

## 2015-09-20 DIAGNOSIS — Z794 Long term (current) use of insulin: Secondary | ICD-10-CM | POA: Diagnosis not present

## 2015-09-20 DIAGNOSIS — E662 Morbid (severe) obesity with alveolar hypoventilation: Secondary | ICD-10-CM | POA: Diagnosis not present

## 2015-09-22 DIAGNOSIS — J9612 Chronic respiratory failure with hypercapnia: Secondary | ICD-10-CM | POA: Diagnosis not present

## 2015-09-22 DIAGNOSIS — I110 Hypertensive heart disease with heart failure: Secondary | ICD-10-CM | POA: Diagnosis not present

## 2015-09-22 DIAGNOSIS — I5033 Acute on chronic diastolic (congestive) heart failure: Secondary | ICD-10-CM | POA: Diagnosis not present

## 2015-09-22 DIAGNOSIS — E039 Hypothyroidism, unspecified: Secondary | ICD-10-CM | POA: Diagnosis not present

## 2015-09-22 DIAGNOSIS — J9611 Chronic respiratory failure with hypoxia: Secondary | ICD-10-CM | POA: Diagnosis not present

## 2015-09-22 DIAGNOSIS — Z7982 Long term (current) use of aspirin: Secondary | ICD-10-CM | POA: Diagnosis not present

## 2015-09-22 DIAGNOSIS — E662 Morbid (severe) obesity with alveolar hypoventilation: Secondary | ICD-10-CM | POA: Diagnosis not present

## 2015-09-22 DIAGNOSIS — E785 Hyperlipidemia, unspecified: Secondary | ICD-10-CM | POA: Diagnosis not present

## 2015-09-22 DIAGNOSIS — J441 Chronic obstructive pulmonary disease with (acute) exacerbation: Secondary | ICD-10-CM | POA: Diagnosis not present

## 2015-09-22 DIAGNOSIS — Z6841 Body Mass Index (BMI) 40.0 and over, adult: Secondary | ICD-10-CM | POA: Diagnosis not present

## 2015-09-22 DIAGNOSIS — F320 Major depressive disorder, single episode, mild: Secondary | ICD-10-CM | POA: Diagnosis not present

## 2015-09-22 DIAGNOSIS — Z794 Long term (current) use of insulin: Secondary | ICD-10-CM | POA: Diagnosis not present

## 2015-09-22 DIAGNOSIS — E1165 Type 2 diabetes mellitus with hyperglycemia: Secondary | ICD-10-CM | POA: Diagnosis not present

## 2015-09-23 DIAGNOSIS — J441 Chronic obstructive pulmonary disease with (acute) exacerbation: Secondary | ICD-10-CM | POA: Diagnosis not present

## 2015-09-23 DIAGNOSIS — I110 Hypertensive heart disease with heart failure: Secondary | ICD-10-CM | POA: Diagnosis not present

## 2015-09-23 DIAGNOSIS — I5033 Acute on chronic diastolic (congestive) heart failure: Secondary | ICD-10-CM | POA: Diagnosis not present

## 2015-09-23 DIAGNOSIS — Z6841 Body Mass Index (BMI) 40.0 and over, adult: Secondary | ICD-10-CM | POA: Diagnosis not present

## 2015-09-23 DIAGNOSIS — Z7982 Long term (current) use of aspirin: Secondary | ICD-10-CM | POA: Diagnosis not present

## 2015-09-23 DIAGNOSIS — E1165 Type 2 diabetes mellitus with hyperglycemia: Secondary | ICD-10-CM | POA: Diagnosis not present

## 2015-09-23 DIAGNOSIS — E785 Hyperlipidemia, unspecified: Secondary | ICD-10-CM | POA: Diagnosis not present

## 2015-09-23 DIAGNOSIS — J9611 Chronic respiratory failure with hypoxia: Secondary | ICD-10-CM | POA: Diagnosis not present

## 2015-09-23 DIAGNOSIS — E039 Hypothyroidism, unspecified: Secondary | ICD-10-CM | POA: Diagnosis not present

## 2015-09-23 DIAGNOSIS — J9612 Chronic respiratory failure with hypercapnia: Secondary | ICD-10-CM | POA: Diagnosis not present

## 2015-09-23 DIAGNOSIS — Z794 Long term (current) use of insulin: Secondary | ICD-10-CM | POA: Diagnosis not present

## 2015-09-23 DIAGNOSIS — E662 Morbid (severe) obesity with alveolar hypoventilation: Secondary | ICD-10-CM | POA: Diagnosis not present

## 2015-09-23 DIAGNOSIS — F320 Major depressive disorder, single episode, mild: Secondary | ICD-10-CM | POA: Diagnosis not present

## 2015-09-26 DIAGNOSIS — J449 Chronic obstructive pulmonary disease, unspecified: Secondary | ICD-10-CM | POA: Diagnosis not present

## 2015-09-27 DIAGNOSIS — E1165 Type 2 diabetes mellitus with hyperglycemia: Secondary | ICD-10-CM | POA: Diagnosis not present

## 2015-09-27 DIAGNOSIS — Z794 Long term (current) use of insulin: Secondary | ICD-10-CM | POA: Diagnosis not present

## 2015-09-27 DIAGNOSIS — E785 Hyperlipidemia, unspecified: Secondary | ICD-10-CM | POA: Diagnosis not present

## 2015-09-27 DIAGNOSIS — I5033 Acute on chronic diastolic (congestive) heart failure: Secondary | ICD-10-CM | POA: Diagnosis not present

## 2015-09-27 DIAGNOSIS — J441 Chronic obstructive pulmonary disease with (acute) exacerbation: Secondary | ICD-10-CM | POA: Diagnosis not present

## 2015-09-27 DIAGNOSIS — Z6841 Body Mass Index (BMI) 40.0 and over, adult: Secondary | ICD-10-CM | POA: Diagnosis not present

## 2015-09-27 DIAGNOSIS — E662 Morbid (severe) obesity with alveolar hypoventilation: Secondary | ICD-10-CM | POA: Diagnosis not present

## 2015-09-27 DIAGNOSIS — E039 Hypothyroidism, unspecified: Secondary | ICD-10-CM | POA: Diagnosis not present

## 2015-09-27 DIAGNOSIS — J9611 Chronic respiratory failure with hypoxia: Secondary | ICD-10-CM | POA: Diagnosis not present

## 2015-09-27 DIAGNOSIS — I110 Hypertensive heart disease with heart failure: Secondary | ICD-10-CM | POA: Diagnosis not present

## 2015-09-27 DIAGNOSIS — F320 Major depressive disorder, single episode, mild: Secondary | ICD-10-CM | POA: Diagnosis not present

## 2015-09-27 DIAGNOSIS — Z7982 Long term (current) use of aspirin: Secondary | ICD-10-CM | POA: Diagnosis not present

## 2015-09-27 DIAGNOSIS — J9612 Chronic respiratory failure with hypercapnia: Secondary | ICD-10-CM | POA: Diagnosis not present

## 2015-09-29 DIAGNOSIS — E785 Hyperlipidemia, unspecified: Secondary | ICD-10-CM | POA: Diagnosis not present

## 2015-09-29 DIAGNOSIS — J9612 Chronic respiratory failure with hypercapnia: Secondary | ICD-10-CM | POA: Diagnosis not present

## 2015-09-29 DIAGNOSIS — Z794 Long term (current) use of insulin: Secondary | ICD-10-CM | POA: Diagnosis not present

## 2015-09-29 DIAGNOSIS — I110 Hypertensive heart disease with heart failure: Secondary | ICD-10-CM | POA: Diagnosis not present

## 2015-09-29 DIAGNOSIS — I5033 Acute on chronic diastolic (congestive) heart failure: Secondary | ICD-10-CM | POA: Diagnosis not present

## 2015-09-29 DIAGNOSIS — E039 Hypothyroidism, unspecified: Secondary | ICD-10-CM | POA: Diagnosis not present

## 2015-09-29 DIAGNOSIS — J441 Chronic obstructive pulmonary disease with (acute) exacerbation: Secondary | ICD-10-CM | POA: Diagnosis not present

## 2015-09-29 DIAGNOSIS — Z6841 Body Mass Index (BMI) 40.0 and over, adult: Secondary | ICD-10-CM | POA: Diagnosis not present

## 2015-09-29 DIAGNOSIS — Z7982 Long term (current) use of aspirin: Secondary | ICD-10-CM | POA: Diagnosis not present

## 2015-09-29 DIAGNOSIS — J9611 Chronic respiratory failure with hypoxia: Secondary | ICD-10-CM | POA: Diagnosis not present

## 2015-09-29 DIAGNOSIS — F320 Major depressive disorder, single episode, mild: Secondary | ICD-10-CM | POA: Diagnosis not present

## 2015-09-29 DIAGNOSIS — E662 Morbid (severe) obesity with alveolar hypoventilation: Secondary | ICD-10-CM | POA: Diagnosis not present

## 2015-09-29 DIAGNOSIS — E1165 Type 2 diabetes mellitus with hyperglycemia: Secondary | ICD-10-CM | POA: Diagnosis not present

## 2015-09-30 DIAGNOSIS — F320 Major depressive disorder, single episode, mild: Secondary | ICD-10-CM | POA: Diagnosis not present

## 2015-09-30 DIAGNOSIS — E662 Morbid (severe) obesity with alveolar hypoventilation: Secondary | ICD-10-CM | POA: Diagnosis not present

## 2015-09-30 DIAGNOSIS — Z6841 Body Mass Index (BMI) 40.0 and over, adult: Secondary | ICD-10-CM | POA: Diagnosis not present

## 2015-09-30 DIAGNOSIS — Z7982 Long term (current) use of aspirin: Secondary | ICD-10-CM | POA: Diagnosis not present

## 2015-09-30 DIAGNOSIS — I110 Hypertensive heart disease with heart failure: Secondary | ICD-10-CM | POA: Diagnosis not present

## 2015-09-30 DIAGNOSIS — I5033 Acute on chronic diastolic (congestive) heart failure: Secondary | ICD-10-CM | POA: Diagnosis not present

## 2015-09-30 DIAGNOSIS — J441 Chronic obstructive pulmonary disease with (acute) exacerbation: Secondary | ICD-10-CM | POA: Diagnosis not present

## 2015-09-30 DIAGNOSIS — E1165 Type 2 diabetes mellitus with hyperglycemia: Secondary | ICD-10-CM | POA: Diagnosis not present

## 2015-09-30 DIAGNOSIS — E039 Hypothyroidism, unspecified: Secondary | ICD-10-CM | POA: Diagnosis not present

## 2015-09-30 DIAGNOSIS — J9612 Chronic respiratory failure with hypercapnia: Secondary | ICD-10-CM | POA: Diagnosis not present

## 2015-09-30 DIAGNOSIS — J9611 Chronic respiratory failure with hypoxia: Secondary | ICD-10-CM | POA: Diagnosis not present

## 2015-09-30 DIAGNOSIS — E785 Hyperlipidemia, unspecified: Secondary | ICD-10-CM | POA: Diagnosis not present

## 2015-09-30 DIAGNOSIS — Z794 Long term (current) use of insulin: Secondary | ICD-10-CM | POA: Diagnosis not present

## 2015-10-03 DIAGNOSIS — J441 Chronic obstructive pulmonary disease with (acute) exacerbation: Secondary | ICD-10-CM | POA: Diagnosis not present

## 2015-10-03 DIAGNOSIS — F320 Major depressive disorder, single episode, mild: Secondary | ICD-10-CM | POA: Diagnosis not present

## 2015-10-03 DIAGNOSIS — Z794 Long term (current) use of insulin: Secondary | ICD-10-CM | POA: Diagnosis not present

## 2015-10-03 DIAGNOSIS — I110 Hypertensive heart disease with heart failure: Secondary | ICD-10-CM | POA: Diagnosis not present

## 2015-10-03 DIAGNOSIS — E785 Hyperlipidemia, unspecified: Secondary | ICD-10-CM | POA: Diagnosis not present

## 2015-10-03 DIAGNOSIS — I5033 Acute on chronic diastolic (congestive) heart failure: Secondary | ICD-10-CM | POA: Diagnosis not present

## 2015-10-03 DIAGNOSIS — Z6841 Body Mass Index (BMI) 40.0 and over, adult: Secondary | ICD-10-CM | POA: Diagnosis not present

## 2015-10-03 DIAGNOSIS — Z7982 Long term (current) use of aspirin: Secondary | ICD-10-CM | POA: Diagnosis not present

## 2015-10-03 DIAGNOSIS — E662 Morbid (severe) obesity with alveolar hypoventilation: Secondary | ICD-10-CM | POA: Diagnosis not present

## 2015-10-03 DIAGNOSIS — J9612 Chronic respiratory failure with hypercapnia: Secondary | ICD-10-CM | POA: Diagnosis not present

## 2015-10-03 DIAGNOSIS — J9611 Chronic respiratory failure with hypoxia: Secondary | ICD-10-CM | POA: Diagnosis not present

## 2015-10-03 DIAGNOSIS — E1165 Type 2 diabetes mellitus with hyperglycemia: Secondary | ICD-10-CM | POA: Diagnosis not present

## 2015-10-03 DIAGNOSIS — E039 Hypothyroidism, unspecified: Secondary | ICD-10-CM | POA: Diagnosis not present

## 2015-10-04 ENCOUNTER — Encounter (HOSPITAL_COMMUNITY): Payer: BLUE CROSS/BLUE SHIELD

## 2015-10-04 ENCOUNTER — Telehealth: Payer: Self-pay | Admitting: Pulmonary Disease

## 2015-10-04 DIAGNOSIS — Z7982 Long term (current) use of aspirin: Secondary | ICD-10-CM | POA: Diagnosis not present

## 2015-10-04 DIAGNOSIS — Z6841 Body Mass Index (BMI) 40.0 and over, adult: Secondary | ICD-10-CM | POA: Diagnosis not present

## 2015-10-04 DIAGNOSIS — J9611 Chronic respiratory failure with hypoxia: Secondary | ICD-10-CM | POA: Diagnosis not present

## 2015-10-04 DIAGNOSIS — I110 Hypertensive heart disease with heart failure: Secondary | ICD-10-CM | POA: Diagnosis not present

## 2015-10-04 DIAGNOSIS — J9612 Chronic respiratory failure with hypercapnia: Secondary | ICD-10-CM | POA: Diagnosis not present

## 2015-10-04 DIAGNOSIS — E039 Hypothyroidism, unspecified: Secondary | ICD-10-CM | POA: Diagnosis not present

## 2015-10-04 DIAGNOSIS — J9602 Acute respiratory failure with hypercapnia: Principal | ICD-10-CM

## 2015-10-04 DIAGNOSIS — J9601 Acute respiratory failure with hypoxia: Secondary | ICD-10-CM

## 2015-10-04 DIAGNOSIS — E1165 Type 2 diabetes mellitus with hyperglycemia: Secondary | ICD-10-CM | POA: Diagnosis not present

## 2015-10-04 DIAGNOSIS — I5033 Acute on chronic diastolic (congestive) heart failure: Secondary | ICD-10-CM | POA: Diagnosis not present

## 2015-10-04 DIAGNOSIS — E662 Morbid (severe) obesity with alveolar hypoventilation: Secondary | ICD-10-CM | POA: Diagnosis not present

## 2015-10-04 DIAGNOSIS — E785 Hyperlipidemia, unspecified: Secondary | ICD-10-CM | POA: Diagnosis not present

## 2015-10-04 DIAGNOSIS — J441 Chronic obstructive pulmonary disease with (acute) exacerbation: Secondary | ICD-10-CM | POA: Diagnosis not present

## 2015-10-04 DIAGNOSIS — Z794 Long term (current) use of insulin: Secondary | ICD-10-CM | POA: Diagnosis not present

## 2015-10-04 DIAGNOSIS — F320 Major depressive disorder, single episode, mild: Secondary | ICD-10-CM | POA: Diagnosis not present

## 2015-10-04 NOTE — Progress Notes (Signed)
Patient ID: Nina Howell, female   DOB: 1940/11/05, 75 y.o.   MRN: [id number]    Advanced Heart Failure Clinic Note   Primary Care: Dr. Baird Cancer Pulmonologist: Dr. Elsworth Soho Primary Cardiologist: New (Dr. Haroldine Laws)  HPI:  Nina Howell is a 75 y.o. female with history of diastolic CHF, OSA, DM, morbid obesity, COPD/asthma presents to the ER because of increasing shortness of breath.  Sees Dr Elsworth Soho for morbid obesity, OHS and OSA on CPAP has been encouraged to lose weight. Previously seen by Dr [name]. Echo 2011 done for dyspnea showed estimated PA pressure 56 mmHg and EF 45-50%. Underwent cath for abnormal ECG and nuclear study. Normal coronaries 2011. Follows with Dr. Johnsie Cancel now.   Recently treated with prednisone and cellcept for bullous pemphigoid  Over the last 3 weeks she had worsening dyspnea. PCP started antibiotics with little relief. She called EMS due to worsening dyspnea. Placed on short term bipap. Placed on antibiotics, soludmedrol, nebulizers and demadex.  She has been diuresed with demadex 60 bid (takes 10 bid at home). Overall diuresed 30 pounds. On ECHO RV severely dilated and LV EF 50-55%. With RVSP 69. Moderate pericardial effusion Feels back to baseline. Says at home she is able to do all ADLs and go to store without too much problem. Wears CPAP routinely. Renal function stable. No CP.   VQ 08/25/15: Limited study due to body habitus. Possible 2 perfusion defects -> recommend chest CT CT 08/26/15: -> No PE Moderate to large pericardial effusion  She presents today for regular follow up. Doing well over all. Occasionally taking extra torsemide with an extra K for weight gain. Keeps good logs of her weight and glucose.  Weight 330-335 at home.  Glucose as high as >700 but actively adjusting meds via her Endocrinologist. She is off 02 and HH no longer following, is very proud of this fact. Held off on her pulmonology appointment to follow up with her Endocrinologist.   Does 17 steps at home and doesn't have any problem with breathing up or down.  (02 sat > 92% as well). Says she is trying to spread out her visits with high out of pocket costs.   Past Medical History  Diagnosis Date  . Cellulitis     ABDOMINAL WALL  . CHF (congestive heart failure) (HCC)     DECOMPENSATED SYSTOLIC CHF  . Hypoventilation   . Hypokalemia   . CHF (congestive heart failure) ([place])   . Diabetes mellitus, type 2 ([place])   . Hypothyroidism   . Varicose veins   . Morbid obesity ([place])   . IUD (intrauterine device) in place   . DJD (degenerative joint disease)   . Hypertension   . Anemia   . Hepatic lesion   . Asymptomatic cholelithiasis   . Hyperlipidemia   . CTS (carpal tunnel syndrome)   . 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 Acute pancreatitis   . Sleep apnea     cpap  . Rash and nonspecific skin eruption     all over- 12-17-14 "drying in _ Auto immune problem"-seeing dermalogist    Current Outpatient Prescriptions  Medication Sig Dispense Refill  . albuterol (PROVENTIL HFA;VENTOLIN HFA) 108 (90 Base) MCG/ACT inhaler Inhale 2 puffs into the lungs every 4 (four) hours as needed for wheezing or shortness of breath.    Marland Kitchen aspirin 81 MG tablet Take 81 mg by mouth daily.    Marland Kitchen atorvastatin (LIPITOR) 20 MG tablet Take 20 mg by mouth every other  day.    . beclomethasone (QVAR) 80 MCG/ACT inhaler Inhale 2 puffs into the lungs 2 (two) times daily.    . Cholecalciferol (VITAMIN D3) 1000 UNITS CAPS Take 1 tablet by mouth daily.      Marland Kitchen CRANBERRY PO Take 3 tablets by mouth daily.     . Cyanocobalamin (VITAMIN B-12) 2500 MCG SUBL Place 1 tablet under the tongue daily.    . fluocinonide (LIDEX) 0.05 % external solution Apply 1 application topically 2 (two) times daily as needed (dermatosis).     . insulin NPH-regular Human (NOVOLIN 70/30) (70-30) 100 UNIT/ML injection Inject 30 Units into the skin 2 (two) times daily with a meal.     . levothyroxine (SYNTHROID,  LEVOTHROID) 50 MCG tablet Take 50 mcg by mouth daily.      . Liraglutide (VICTOZA) 18 MG/3ML SOPN Inject 1.8 mg into the skin daily.    . metFORMIN (GLUCOPHAGE) 1000 MG tablet Take 1,000 mg by mouth 2 (two) times daily with a meal.    . Multiple Vitamin (MULTIVITAMIN WITH MINERALS) TABS tablet Take 0.5 tablets by mouth daily.    . polyethylene glycol (MIRALAX / GLYCOLAX) packet Take 17 g by mouth daily as needed. 14 each 0  . potassium chloride SA (K-DUR,KLOR-CON) 20 MEQ tablet Take 20 mEq by mouth every morning. Take an additional 20 meq tablet if take extra torsemide    . sertraline (ZOLOFT) 50 MG tablet Take 100 mg by mouth at bedtime.     Marland Kitchen spironolactone (ALDACTONE) 25 MG tablet Take 1 tablet (25 mg total) by mouth daily. 90 tablet 3  . torsemide (DEMADEX) 20 MG tablet Take 20 mg by mouth 2 (two) times daily. Take an additional 20 mg tablet for weight gain     No current facility-administered medications for this encounter.    Allergies  Allergen Reactions  . Celebrex [Celecoxib] Swelling  . Atorvastatin Other (See Comments)  . Cephalexin     REACTION: hives  . Cephalosporins     REACTION: rash  . Lasix [Furosemide] Hives  . Oxycodone-Acetaminophen Nausea Only  . Penicillins Other (See Comments)    Not known  . Oxycodone Anxiety    Felt weird      Social History   Social History  . Marital Status: Married    Spouse Name: N/A  . Number of Children: 4  . Years of Education: N/A   Occupational History  . retired     Education officer, museum   Social History Main Topics  . Smoking status: Never Smoker   . Smokeless tobacco: Never Used  . Alcohol Use: No  . Drug Use: No  . Sexual Activity: Yes    Birth Control/ Protection: IUD   Other Topics Concern  . Not on file   Social History Narrative      Family History  Problem Relation Age of Onset  . Cancer Mother     throat  . Diabetes Father   . Leukemia Father     There were no vitals filed for this visit.   Wt  Readings from Last 3 Encounters:  09/07/15 335 lb 12.8 oz (152.318 kg)  09/05/15 334 lb 12.8 oz (151.864 kg)  08/27/15 331 lb 1.6 oz (150.186 kg)     PHYSICAL EXAM: General:  Elderly appearing, entered clinic in [facility]. Husband present.  HEENT: normal Neck: supple. JVD difficult to see with girth. Carotids 2+ bilat; no bruits. No thyromegaly or nodule noted Cor: PMI nondisplaced. Regular rate & rhythm.  No rubs, gallops or murmurs appreciated Lungs: Slightly decreased left base.  Abdomen: soft, NT, ND, no HSM. No bruits or masses. +BS  Extremities: no cyanosis, clubbing, rash, edema Neuro: alert & oriented x 3, cranial nerves grossly intact. moves all 4 extremities w/o difficulty. Affect pleasant.   ASSESSMENT & PLAN:  1. Chronic diastolic HF - Echo 07/26/16 LVEF 50-55%, Grade 1 DD, RV severely dilated and severely reduced. - Down 30+ pounds in hospital - Continue torsemide to 20 mg BID.  Can take an additional 20 mg as needed for weight gain.  2. 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  - VQ scan was indeterminate but Chest CT not suggestive of chronic PE, LE dopplers negative.  -  ANA 1:80 Positive, RF, P-ANCA negative,  ESR 35. Likely not clinically significant.  - She was recently contacted to move up her Pulmonary appointment with this diagnosis, but refused, opting to wait until August, encouraged her to move up appointment. She says she will consider.  3. Pericardial effusion, moderate to large - Needs repeat echo to see if better with diuresis.   4. Morbid obesity - She is aware that weight is a major contributing factor to her symptoms. Encouraged to lose weight and increase activity as able.  5. Abnormal ECG with inferior Q waves 6. Obesity hypoventilation syndrome - Continued to counsel on weight loss.  7. OSA on CPAP - Continue CPAP  - No longer needing supplemental 02   Repeat BMET today. Had  ordered check for end of April, but nothing showing in system or lab folders. 6-8 week follow up with Echo to make sure pericardial effusion has improved.  Pt refuses to follow up any sooner or to get Echo separately.   Total time spent 25 minutes, over half that spent discussing the above.   Legrand Como [address] [place], PA-C 10/05/2015 1:58 PM

## 2015-10-04 NOTE — Telephone Encounter (Signed)
Pt states that the home health nurses with [facility] are discharging her - Magda Paganini and Andee Poles ([phone] States that she was advised to contact our office to have [place] d/c her O2. Pt has not used O2 since Thursday 09/29/15. Pt states that the nurse walked her around her home and up/down stairs in her home her O2 only dropped to 96%. Advised pt that I would call [facility] and verify this information before making any changes.  Called [facility] to speak with Magda Paganini or Andee Poles - both are out visiting patients and are unable to be reached at the time. Advised that could speak with the Nurse Manager Kendrick Fries and she could help. LM with Juleen Starr (nurse manager) to return call to verify information.

## 2015-10-05 ENCOUNTER — Encounter (HOSPITAL_COMMUNITY): Payer: Self-pay

## 2015-10-05 ENCOUNTER — Ambulatory Visit (HOSPITAL_COMMUNITY)
Admission: RE | Admit: 2015-10-05 | Discharge: 2015-10-05 | Disposition: A | Discharge status: Home / Self Care | Payer: BLUE CROSS/BLUE SHIELD | Source: Ambulatory Visit | Attending: Internal Medicine | Admitting: Internal Medicine

## 2015-10-05 ENCOUNTER — Telehealth: Payer: Self-pay | Admitting: Pulmonary Disease

## 2015-10-05 VITALS — BP 98/62 | HR 88 | Wt 334.4 lb

## 2015-10-05 DIAGNOSIS — Z881 Allergy status to other antibiotic agents status: Secondary | ICD-10-CM | POA: Insufficient documentation

## 2015-10-05 DIAGNOSIS — K761 Chronic passive congestion of liver: Secondary | ICD-10-CM | POA: Diagnosis not present

## 2015-10-05 DIAGNOSIS — G4733 Obstructive sleep apnea (adult) (pediatric): Secondary | ICD-10-CM

## 2015-10-05 DIAGNOSIS — I110 Hypertensive heart disease with heart failure: Secondary | ICD-10-CM | POA: Insufficient documentation

## 2015-10-05 DIAGNOSIS — E119 Type 2 diabetes mellitus without complications: Secondary | ICD-10-CM | POA: Insufficient documentation

## 2015-10-05 DIAGNOSIS — I272 Other secondary pulmonary hypertension: Secondary | ICD-10-CM | POA: Insufficient documentation

## 2015-10-05 DIAGNOSIS — Z888 Allergy status to other drugs, medicaments and biological substances status: Secondary | ICD-10-CM | POA: Insufficient documentation

## 2015-10-05 DIAGNOSIS — I5040 Unspecified combined systolic (congestive) and diastolic (congestive) heart failure: Secondary | ICD-10-CM

## 2015-10-05 DIAGNOSIS — I313 Pericardial effusion (noninflammatory): Secondary | ICD-10-CM | POA: Insufficient documentation

## 2015-10-05 DIAGNOSIS — Z885 Allergy status to narcotic agent status: Secondary | ICD-10-CM | POA: Insufficient documentation

## 2015-10-05 DIAGNOSIS — E662 Morbid (severe) obesity with alveolar hypoventilation: Secondary | ICD-10-CM | POA: Diagnosis not present

## 2015-10-05 DIAGNOSIS — Z794 Long term (current) use of insulin: Secondary | ICD-10-CM | POA: Insufficient documentation

## 2015-10-05 DIAGNOSIS — J449 Chronic obstructive pulmonary disease, unspecified: Secondary | ICD-10-CM | POA: Insufficient documentation

## 2015-10-05 DIAGNOSIS — Z833 Family history of diabetes mellitus: Secondary | ICD-10-CM | POA: Diagnosis not present

## 2015-10-05 DIAGNOSIS — Z880 Allergy status to penicillin: Secondary | ICD-10-CM | POA: Insufficient documentation

## 2015-10-05 DIAGNOSIS — Z7982 Long term (current) use of aspirin: Secondary | ICD-10-CM | POA: Diagnosis not present

## 2015-10-05 DIAGNOSIS — I5032 Chronic diastolic (congestive) heart failure: Secondary | ICD-10-CM | POA: Insufficient documentation

## 2015-10-05 DIAGNOSIS — E785 Hyperlipidemia, unspecified: Secondary | ICD-10-CM | POA: Diagnosis not present

## 2015-10-05 DIAGNOSIS — Z79899 Other long term (current) drug therapy: Secondary | ICD-10-CM | POA: Diagnosis not present

## 2015-10-05 DIAGNOSIS — R9431 Abnormal electrocardiogram [ECG] [EKG]: Secondary | ICD-10-CM | POA: Insufficient documentation

## 2015-10-05 DIAGNOSIS — E039 Hypothyroidism, unspecified: Secondary | ICD-10-CM | POA: Diagnosis not present

## 2015-10-05 LAB — BASIC METABOLIC PANEL
Anion gap: 13 (ref 5–15)
BUN: 14 mg/dL (ref 6–20)
CO2: 28 mmol/L (ref 22–32)
Calcium: 9 mg/dL (ref 8.9–10.3)
Chloride: 94 mmol/L — ABNORMAL LOW (ref 101–111)
Creatinine, Ser: 0.68 mg/dL (ref 0.44–1.00)
GFR calc Af Amer: >60 mL/min (ref >60)
GLUCOSE: 113 mg/dL — AB (ref 65–99)
POTASSIUM: 3.9 mmol/L (ref 3.5–5.1)
Sodium: 135 mmol/L (ref 135–145)

## 2015-10-05 NOTE — Telephone Encounter (Signed)
Pt returning call, told her that order was sent to dc o2 and she was fine.Nina Howell

## 2015-10-05 NOTE — Telephone Encounter (Signed)
Spoke with pt and she states that [facility] is requesting that she be seen sooner than 01/18/16. Pt states that she would like to see RA if possible. Advised pt that he does not have any openings until the beginning of August. She states that is fine. Appt scheduled for 12/21/15. Nothing further needed.

## 2015-10-05 NOTE — Telephone Encounter (Signed)
RA ok to place order to D/C 02?

## 2015-10-05 NOTE — Telephone Encounter (Signed)
Order to D/c O2 has been placed LVM for pt to return our call to make her aware

## 2015-10-05 NOTE — Progress Notes (Signed)
Advanced Heart Failure Medication Review by a Pharmacist  Does the patient  feel that his/her medications are working for him/her?  yes  Has the patient been experiencing any side effects to the medications prescribed?  no  Does the patient measure his/her own blood pressure or blood glucose at home?  yes   Does the patient have any problems obtaining medications due to transportation or finances?   no  Understanding of regimen: good Understanding of indications: good Potential of compliance: good Patient understands to avoid NSAIDs. Patient understands to avoid decongestants.  Issues to address at subsequent visits: None   Pharmacist comments:  Nina Howell is a pleasant 75 yo F presenting with a current medication list and weight/BG log. She reports good compliance with her regimen and did state that she had to take an additional torsemide yesterday for a 3 lb weight gain. She did not have any other medication-related questions or concerns for me at this time.   Nina Howell, PharmD, BCPS, CPP Clinical Pharmacist Pager: [phone] Phone: [phone] 10/05/2015 2:05 PM      Time with patient: 10 minutes Preparation and documentation time: 2 minutes Total time: 12 minutes

## 2015-10-05 NOTE — Patient Instructions (Signed)
LABS TODAY  Your physician recommends that you schedule a follow-up appointment in: [place] physician has requested that you have an echocardiogram. Echocardiography is a painless test that uses sound waves to create images of your heart. It provides your doctor with information about the size and shape of your heart and how well your heart's chambers and valves are working. This procedure takes approximately one hour. There are no restrictions for this procedure.  Do the following things EVERYDAY: 1) Weigh yourself in the morning before breakfast. Write it down and keep it in a log. 2) Take your medicines as prescribed 3) Eat low salt foods-Limit salt (sodium) to 2000 mg per day.  4) Stay as active as you can everyday 5) Limit all fluids for the day to less than 2 liters 6)

## 2015-10-05 NOTE — Telephone Encounter (Signed)
Dc O2

## 2015-10-06 DIAGNOSIS — I5033 Acute on chronic diastolic (congestive) heart failure: Secondary | ICD-10-CM | POA: Diagnosis not present

## 2015-10-06 DIAGNOSIS — E662 Morbid (severe) obesity with alveolar hypoventilation: Secondary | ICD-10-CM | POA: Diagnosis not present

## 2015-10-06 DIAGNOSIS — E039 Hypothyroidism, unspecified: Secondary | ICD-10-CM | POA: Diagnosis not present

## 2015-10-06 DIAGNOSIS — Z7982 Long term (current) use of aspirin: Secondary | ICD-10-CM | POA: Diagnosis not present

## 2015-10-06 DIAGNOSIS — Z6841 Body Mass Index (BMI) 40.0 and over, adult: Secondary | ICD-10-CM | POA: Diagnosis not present

## 2015-10-06 DIAGNOSIS — J9611 Chronic respiratory failure with hypoxia: Secondary | ICD-10-CM | POA: Diagnosis not present

## 2015-10-06 DIAGNOSIS — J441 Chronic obstructive pulmonary disease with (acute) exacerbation: Secondary | ICD-10-CM | POA: Diagnosis not present

## 2015-10-06 DIAGNOSIS — Z794 Long term (current) use of insulin: Secondary | ICD-10-CM | POA: Diagnosis not present

## 2015-10-06 DIAGNOSIS — E1165 Type 2 diabetes mellitus with hyperglycemia: Secondary | ICD-10-CM | POA: Diagnosis not present

## 2015-10-06 DIAGNOSIS — F320 Major depressive disorder, single episode, mild: Secondary | ICD-10-CM | POA: Diagnosis not present

## 2015-10-06 DIAGNOSIS — J9612 Chronic respiratory failure with hypercapnia: Secondary | ICD-10-CM | POA: Diagnosis not present

## 2015-10-06 DIAGNOSIS — E785 Hyperlipidemia, unspecified: Secondary | ICD-10-CM | POA: Diagnosis not present

## 2015-10-06 DIAGNOSIS — I110 Hypertensive heart disease with heart failure: Secondary | ICD-10-CM | POA: Diagnosis not present

## 2015-10-06 NOTE — Telephone Encounter (Signed)
Noted. Nothing further needed. 

## 2015-10-12 ENCOUNTER — Encounter (HOSPITAL_COMMUNITY): Payer: Self-pay | Admitting: Surgery

## 2015-10-25 ENCOUNTER — Encounter: Payer: Self-pay | Admitting: Internal Medicine

## 2015-11-23 ENCOUNTER — Encounter (HOSPITAL_COMMUNITY): Payer: Self-pay | Admitting: Internal Medicine

## 2015-11-23 ENCOUNTER — Ambulatory Visit (HOSPITAL_COMMUNITY)
Admission: RE | Admit: 2015-11-23 | Discharge: 2015-11-23 | Disposition: A | Discharge status: Home / Self Care | Payer: BLUE CROSS/BLUE SHIELD | Source: Ambulatory Visit | Attending: Internal Medicine | Admitting: Internal Medicine

## 2015-11-23 ENCOUNTER — Ambulatory Visit (HOSPITAL_BASED_OUTPATIENT_CLINIC_OR_DEPARTMENT_OTHER)
Admission: RE | Admit: 2015-11-23 | Discharge: 2015-11-23 | Disposition: A | Discharge status: Home / Self Care | Payer: BLUE CROSS/BLUE SHIELD | Source: Ambulatory Visit | Attending: Internal Medicine | Admitting: Internal Medicine

## 2015-11-23 VITALS — BP 126/72 | HR 76 | Wt 335.5 lb

## 2015-11-23 DIAGNOSIS — E119 Type 2 diabetes mellitus without complications: Secondary | ICD-10-CM | POA: Diagnosis not present

## 2015-11-23 DIAGNOSIS — I3139 Other pericardial effusion (noninflammatory): Secondary | ICD-10-CM

## 2015-11-23 DIAGNOSIS — E662 Morbid (severe) obesity with alveolar hypoventilation: Secondary | ICD-10-CM | POA: Diagnosis not present

## 2015-11-23 DIAGNOSIS — I5040 Unspecified combined systolic (congestive) and diastolic (congestive) heart failure: Secondary | ICD-10-CM

## 2015-11-23 DIAGNOSIS — I5032 Chronic diastolic (congestive) heart failure: Secondary | ICD-10-CM

## 2015-11-23 DIAGNOSIS — I110 Hypertensive heart disease with heart failure: Secondary | ICD-10-CM | POA: Diagnosis not present

## 2015-11-23 DIAGNOSIS — I272 Other secondary pulmonary hypertension: Secondary | ICD-10-CM | POA: Diagnosis not present

## 2015-11-23 DIAGNOSIS — J449 Chronic obstructive pulmonary disease, unspecified: Secondary | ICD-10-CM | POA: Insufficient documentation

## 2015-11-23 DIAGNOSIS — I509 Heart failure, unspecified: Secondary | ICD-10-CM | POA: Diagnosis present

## 2015-11-23 DIAGNOSIS — I313 Pericardial effusion (noninflammatory): Secondary | ICD-10-CM | POA: Insufficient documentation

## 2015-11-23 DIAGNOSIS — I319 Disease of pericardium, unspecified: Secondary | ICD-10-CM

## 2015-11-23 LAB — ECHOCARDIOGRAM COMPLETE
CHL CUP TV REG PEAK VELOCITY: 239 cm/s
E decel time: 197 msec
E/e' ratio: 4.19
FS: 35 % (ref 28–44)
IV/PV OW: 1.03
LA diam end sys: 35 mm
LA diam index: 1.49 cm/m2
LA vol A4C: 55.5 ml
LA vol index: 25.4 mL/m2
LA vol: 59.8 mL
LASIZE: 35 mm
LV E/e'average: 4.19
LV TDI E'LATERAL: 7.4
LV e' LATERAL: 7.4 cm/s
LVEEMED: 4.19
LVOT area: 3.14 cm2
LVOTD: 20 mm
MV Dec: 197
MVPKAVEL: 55.8 m/s
MVPKEVEL: 31 m/s
PW: 12.5 mm — AB (ref 0.6–1.1)
RV TAPSE: 22 mm
TDI e' medial: 7.07
TRMAXVEL: 239 cm/s

## 2015-11-23 MED ORDER — PERFLUTREN LIPID MICROSPHERE
1.0000 mL | INTRAVENOUS | Status: AC | PRN
  Administered 2015-11-23: 2 mL via INTRAVENOUS

## 2015-11-23 MED ORDER — PERFLUTREN LIPID MICROSPHERE
INTRAVENOUS | Status: AC
  Filled 2015-11-23: qty 10

## 2015-11-23 NOTE — Progress Notes (Signed)
*  PRELIMINARY RESULTS* Echocardiogram 2D Echocardiogram with definity has been performed.  Leavy Cella 11/23/2015, 1:16 PM

## 2015-11-23 NOTE — Progress Notes (Signed)
Patient ID: Nina Howell, female   DOB: [birth year], 75 y.o.   MRN: [id number] Patient ID: Nina Howell, female   DOB: 09-17-1940, 75 y.o.   MRN: [id number]    Advanced Heart Failure Clinic Note   Primary Care: Dr. Baird Cancer Pulmonologist: Dr. Elsworth Soho Primary Cardiologist: New (Dr. Haroldine Laws)  HPI:  Nina Howell is a 75 y.o. female with history of diastolic CHF, OSA, DM, morbid obesity, COPD/asthma presents to the ER because of increasing shortness of breath.  Sees Dr Elsworth Soho for morbid obesity, OHS and OSA on CPAP has been encouraged to lose weight. Previously seen by Dr [name]. Echo 2011 done for dyspnea showed estimated PA pressure 56 mmHg and EF 45-50%. Underwent cath for abnormal ECG and nuclear study. Normal coronaries 2011. Follows with Dr. Johnsie Cancel now.   Recently treated with prednisone and cellcept for bullous pemphigoid  Admitted in 4/17 with respiratory failure and volume overload.. Overall diuresed 30 pounds. On ECHO RV severely dilated and LV EF 50-55%. With RVSP 69. Moderate pericardial effusion  VQ 08/25/15: Limited study due to body habitus. Possible 2 perfusion defects -> recommend chest CT CT 08/26/15: -> No PE Moderate to large pericardial effusion  She presents today for regular follow up.  Feeling much better. Breathing better. Weight stable. Able to do ADLs and go to store without too much problem. Taking torsemide 20 bid. ears CPAP routinely. Renal function stable. No CP.    Echo today shows LVEF 45%, RV dilated with septal bounce. Pericardial effusion resolved. Minimal TR. Pulmonary pressures < 40.  Past Medical History  Diagnosis Date  . Cellulitis     ABDOMINAL WALL  . CHF (congestive heart failure) (HCC)     DECOMPENSATED SYSTOLIC CHF  . Hypoventilation   . Hypokalemia   . CHF (congestive heart failure) ([place])   . Diabetes mellitus, type 2 ([place])   . Hypothyroidism   . Varicose veins   . Morbid obesity ([place])   . IUD (intrauterine device) in place    . DJD (degenerative joint disease)   . Hypertension   . Anemia   . Hepatic lesion   . Asymptomatic cholelithiasis   . Hyperlipidemia   . CTS (carpal tunnel syndrome)   . 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 Acute pancreatitis   . Sleep apnea     cpap  . Rash and nonspecific skin eruption     all over- 12-17-14 "drying in _ Auto immune problem"-seeing dermalogist    Current Outpatient Prescriptions  Medication Sig Dispense Refill  . albuterol (PROVENTIL HFA;VENTOLIN HFA) 108 (90 Base) MCG/ACT inhaler Inhale 2 puffs into the lungs every 4 (four) hours as needed for wheezing or shortness of breath.    Marland Kitchen aspirin 81 MG tablet Take 81 mg by mouth daily.    Marland Kitchen atorvastatin (LIPITOR) 20 MG tablet Take 20 mg by mouth every other day.    . beclomethasone (QVAR) 80 MCG/ACT inhaler Inhale 2 puffs into the lungs 2 (two) times daily.    . Cholecalciferol (VITAMIN D3) 1000 UNITS CAPS Take 1 tablet by mouth daily.      Marland Kitchen CRANBERRY PO Take 3 tablets by mouth daily.     . Cyanocobalamin (VITAMIN B-12) 2500 MCG SUBL Place 1 tablet under the tongue daily.    . fluocinonide (LIDEX) 0.05 % external solution Apply 1 application topically 2 (two) times daily as needed (dermatosis).     . insulin NPH-regular Human (NOVOLIN 70/30) (70-30) 100 UNIT/ML injection Inject 30 Units into the  skin 2 (two) times daily with a meal.     . levothyroxine (SYNTHROID, LEVOTHROID) 50 MCG tablet Take 50 mcg by mouth daily.      . Liraglutide (VICTOZA) 18 MG/3ML SOPN Inject 1.8 mg into the skin daily.    . metFORMIN (GLUCOPHAGE) 1000 MG tablet Take 1,000 mg by mouth 2 (two) times daily with a meal.    . Multiple Vitamin (MULTIVITAMIN WITH MINERALS) TABS tablet Take 0.5 tablets by mouth daily.    . polyethylene glycol (MIRALAX / GLYCOLAX) packet Take 17 g by mouth daily as needed. 14 each 0  . potassium chloride SA (K-DUR,KLOR-CON) 20 MEQ tablet Take 20 mEq by mouth every morning. Take an additional 20 meq tablet  if take extra torsemide    . sertraline (ZOLOFT) 50 MG tablet Take 100 mg by mouth at bedtime.     Marland Kitchen spironolactone (ALDACTONE) 25 MG tablet Take 1 tablet (25 mg total) by mouth daily. 90 tablet 3  . torsemide (DEMADEX) 20 MG tablet Take 20 mg by mouth 2 (two) times daily. Take an additional 20 mg tablet for weight gain     No current facility-administered medications for this encounter.    Allergies  Allergen Reactions  . Celebrex [Celecoxib] Swelling  . Atorvastatin Other (See Comments)  . Cephalexin     REACTION: hives  . Cephalosporins     REACTION: rash  . Lasix [Furosemide] Hives  . Oxycodone-Acetaminophen Nausea Only  . Penicillins Other (See Comments)    Not known  . Oxycodone Anxiety    Felt weird      Social History   Social History  . Marital Status: Married    Spouse Name: N/A  . Number of Children: 4  . Years of Education: N/A   Occupational History  . retired     Education officer, museum   Social History Main Topics  . Smoking status: Never Smoker   . Smokeless tobacco: Never Used  . Alcohol Use: No  . Drug Use: No  . Sexual Activity: Yes    Birth Control/ Protection: IUD   Other Topics Concern  . Not on file   Social History Narrative      Family History  Problem Relation Age of Onset  . Cancer Mother     throat  . Diabetes Father   . Leukemia Father     Filed Vitals:   11/23/15 1120  BP: 126/72  Pulse: 76  Weight: 335 lb 8 oz (152.182 kg)  SpO2: 96%     Wt Readings from Last 3 Encounters:  11/23/15 335 lb 8 oz (152.182 kg)  10/05/15 334 lb 6.4 oz (151.683 kg)  09/07/15 335 lb 12.8 oz (152.318 kg)     PHYSICAL EXAM: General:  Elderly appearing, entered clinic in [facility] Husband present.  HEENT: normal Neck: supple. JVD difficult to see with girth appears about 7. Carotids 2+ bilat; no bruits. No thyromegaly or nodule noted Cor: PMI nonpalpable. Regular rate & rhythm. No rubs, gallops or murmurs appreciated Lungs: clear Abdomen: obese  soft, NT, ND, no HSM. No bruits or masses. +BS  Extremities: no cyanosis, clubbing, rash, edema Neuro: alert & oriented x 3, cranial nerves grossly intact. moves all 4 extremities w/o difficulty. Affect pleasant.   ASSESSMENT & PLAN:  1. Chronic diastolic HF - Echo 0/2/72 LVEF 50-55%, Grade 1 DD, RV severely dilated and severely reduced. Echo today EF ~45% with septal bounce. RV dilated with moderate dysfunction. PA pressures < 40. Effsuion resolved -  Down 30+ pounds in hospital during recent admission. Weight stable.  - Continue torsemide 20 mg BID.  Can take an additional 20 mg as needed for weight gain.  2. 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  - PA pressures much improved on echo after diuresis. We discussed R and LHC to clearly define pressures and coronary anatomy but she would like to hold off for now.  - VQ scan was indeterminate but Chest CT not suggestive of chronic PE, LE dopplers negative.  -  ANA 1:80 Positive, RF, P-ANCA negative,  ESR 35. Likely not clinically significant.  - Has pulmonary appointment scheduled for August.  - Recommend pulmonary rehab 3. Pericardial effusion, moderate to large - Now resolved with diuresis  4. Morbid obesity - She is aware that weight is a major contributing factor to her symptoms. Encouraged to lose weight and increase activity as able.  5. Abnormal ECG with inferior Q waves - No angina. Does not want to proceed with cath at this time. Can revisit as needed.  6. Obesity hypoventilation syndrome - Continued to counsel on weight loss.  7. OSA on CPAP - Continue CPAP  - No longer needing supplemental O2  Nina Wrede,MD 11:46 AM

## 2015-11-23 NOTE — Patient Instructions (Signed)
We will contact you in 6 months to schedule your next appointment.  

## 2015-11-23 NOTE — Addendum Note (Signed)
Encounter addended by: Scarlette Calico, RN on: 11/23/2015 11:50 AM<BR>     Documentation filed: Patient Instructions Section

## 2015-11-25 ENCOUNTER — Telehealth: Payer: Self-pay

## 2015-11-25 DIAGNOSIS — Z79899 Other long term (current) drug therapy: Secondary | ICD-10-CM

## 2015-11-25 NOTE — Telephone Encounter (Signed)
Dr. Johnsie Cancel wanted to check bmet 8 weeks from last lab work to check Potassium. Patient will come in on 11/29/15.

## 2015-11-28 DIAGNOSIS — G4733 Obstructive sleep apnea (adult) (pediatric): Secondary | ICD-10-CM | POA: Diagnosis not present

## 2015-11-29 ENCOUNTER — Encounter (HOSPITAL_COMMUNITY): Payer: Self-pay | Admitting: Student

## 2015-11-29 ENCOUNTER — Ambulatory Visit (HOSPITAL_COMMUNITY)
Admission: RE | Admit: 2015-11-29 | Discharge: 2015-11-29 | Disposition: A | Discharge status: Home / Self Care | Payer: BLUE CROSS/BLUE SHIELD | Source: Ambulatory Visit | Attending: Internal Medicine | Admitting: Internal Medicine

## 2015-11-29 ENCOUNTER — Other Ambulatory Visit: Payer: BLUE CROSS/BLUE SHIELD

## 2015-11-29 DIAGNOSIS — H608X3 Other otitis externa, bilateral: Secondary | ICD-10-CM | POA: Diagnosis not present

## 2015-11-29 DIAGNOSIS — Z79899 Other long term (current) drug therapy: Secondary | ICD-10-CM | POA: Insufficient documentation

## 2015-11-29 DIAGNOSIS — H903 Sensorineural hearing loss, bilateral: Secondary | ICD-10-CM | POA: Diagnosis not present

## 2015-11-29 LAB — BASIC METABOLIC PANEL
Anion gap: 11 (ref 5–15)
BUN: 14 mg/dL (ref 6–20)
CHLORIDE: 96 mmol/L — AB (ref 101–111)
CO2: 30 mmol/L (ref 22–32)
Calcium: 9.7 mg/dL (ref 8.9–10.3)
Creatinine, Ser: 0.84 mg/dL (ref 0.44–1.00)
Glucose, Bld: 153 mg/dL — ABNORMAL HIGH (ref 65–99)
Potassium: 3.9 mmol/L (ref 3.5–5.1)
SODIUM: 137 mmol/L (ref 135–145)

## 2015-11-30 ENCOUNTER — Telehealth (HOSPITAL_COMMUNITY): Payer: Self-pay | Admitting: *Deleted

## 2015-11-30 NOTE — Telephone Encounter (Signed)
Pt called to report she has sore throat, she states she awoke this AM with the pain and it hurts to swallow.  She denies fever, cough or runny nose.  She states she is taking her allergy medicine daily.  Advised ok to use OTC tylenol and throat spray, should f/u with pcp if not getting better, she is agreeable

## 2015-12-03 ENCOUNTER — Telehealth: Payer: Self-pay | Admitting: Physician Assistant

## 2015-12-03 NOTE — Telephone Encounter (Signed)
Paged by answering service, patient has worsening sore throat. Reviewed prior encounter. Now she has cough and brown phlegm. The patient has called her new PCP yesterday but no one has returned her call. Had fever last night.  Her symptoms concerning of bronchitis. Advised to call PCP. If unable to reach got to Urgent care or ER. She is agree to plan.

## 2015-12-08 DIAGNOSIS — G4733 Obstructive sleep apnea (adult) (pediatric): Secondary | ICD-10-CM | POA: Diagnosis not present

## 2015-12-21 ENCOUNTER — Ambulatory Visit (INDEPENDENT_AMBULATORY_CARE_PROVIDER_SITE_OTHER): Payer: BLUE CROSS/BLUE SHIELD | Admitting: Pulmonary Disease

## 2015-12-21 ENCOUNTER — Encounter: Payer: Self-pay | Admitting: Pulmonary Disease

## 2015-12-21 DIAGNOSIS — E662 Morbid (severe) obesity with alveolar hypoventilation: Secondary | ICD-10-CM

## 2015-12-21 DIAGNOSIS — I272 Other secondary pulmonary hypertension: Secondary | ICD-10-CM

## 2015-12-21 NOTE — Assessment & Plan Note (Signed)
Multifactorial- OHS and diastolic dysfunction Does not need vasodilator therapy Continue diuretics and BiPAP

## 2015-12-21 NOTE — Assessment & Plan Note (Signed)
BiPAP settings are adequate and seem to be working well She has excellent compliance

## 2015-12-21 NOTE — Progress Notes (Signed)
   Subjective:    Patient ID: Nina Howell, female    DOB: 1940/10/24, 75 y.o.   MRN: [id number]  HPI  74/F, Never smoker morbidly obese for FU of obstructive sleep apnea/ OHS .  She was diagnosed with bullous pemphigoid  On prednisone Transiently   12/21/2015  Chief Complaint  Patient presents with  . Sleep Apnea    Doing well on CPAP machine; uses every night.  no concerns.  Marland Kitchen Respiratory Failure    spitting up thick white sputum; no other concerns.   She was admitted for/2017 for acute hypercarbic respiratory failure, found to have really high pulmonary artery pressures on echo ANA 1: 160 speckled, other autoimmune workup negative VQ scan and CT angiogram did not show pulmonary embolus.  She was diuresed more than 30 pounds, now on-torsemide 20 twice daily. Repeat echo showed improvement in pulmonary pressures She transiently required oxygen and is now off  She developed an episode of bronchitis about a month ago required Levaquin to be called in,She is back to her baseline now  Compliant with BiPAP by her report and on download, no residuals excellent compliance more than 8 hours per night average 13/9 No problems with mask, pressure or supplies  Significant tests/ events  08/19/2015 : Recent admission for acute respiratory failure with hypoxia and hypercarbia thought to be multifactorial combination of COPD/asthma/CHF ( likely 2/2 increased pulmonary hypertension in context of obesity and COPD ).   08/2015:2-D echo shows severely dilated RV with severely reduced RV systolic function(new-not seen on prior Echo)  PA pressure of 69 11/2015 repeat echo >>. Pressures decreased  Adm 03/2010 with worsening dyspnea & hypoxia, VQ neg, BNP 103, diuresed, ABG 7.43/63/89/97% on 2 L, discharged on 2 L & Bilevel 14/8  PSG 04/2010 showed moderate obstructive sleep apnea with AHi 15/h.  Echo july'11 >> EF 45-50%septal/ inferoseptal hypokinesis Nina Howell)    05/2011  hysterescopy- dnc and polyp resection performed under spinal anesthesia , uneventful   Review of Systems neg for any significant sore throat, dysphagia, itching, sneezing, nasal congestion or excess/ purulent secretions, fever, chills, sweats, unintended wt loss, pleuritic or exertional cp, hempoptysis, orthopnea pnd or change in chronic leg swelling. Also denies presyncope, palpitations, heartburn, abdominal pain, nausea, vomiting, diarrhea or change in bowel or urinary habits, dysuria,hematuria, rash, arthralgias, visual complaints, headache, numbness weakness or ataxia.     Objective:   Physical Exam  Gen. Pleasant, obese, in no distress ENT - no lesions, no post nasal drip Neck: No JVD, no thyromegaly, no carotid bruits Lungs: no use of accessory muscles, no dullness to percussion, decreased without rales or rhonchi  Cardiovascular: Rhythm regular, heart sounds  normal, no murmurs or gallops, no peripheral edema Musculoskeletal: No deformities, no cyanosis or clubbing , no tremors       Assessment & Plan:

## 2015-12-21 NOTE — Patient Instructions (Signed)
Stay on fluid pills Bipap settings working well

## 2015-12-27 DIAGNOSIS — Z794 Long term (current) use of insulin: Secondary | ICD-10-CM | POA: Diagnosis not present

## 2015-12-27 DIAGNOSIS — E039 Hypothyroidism, unspecified: Secondary | ICD-10-CM | POA: Diagnosis not present

## 2015-12-27 DIAGNOSIS — E1142 Type 2 diabetes mellitus with diabetic polyneuropathy: Secondary | ICD-10-CM | POA: Diagnosis not present

## 2015-12-27 DIAGNOSIS — Z6841 Body Mass Index (BMI) 40.0 and over, adult: Secondary | ICD-10-CM | POA: Diagnosis not present

## 2015-12-29 ENCOUNTER — Encounter: Payer: Self-pay | Admitting: Pulmonary Disease

## 2016-01-02 DIAGNOSIS — M8589 Other specified disorders of bone density and structure, multiple sites: Secondary | ICD-10-CM | POA: Diagnosis not present

## 2016-01-02 DIAGNOSIS — Z780 Asymptomatic menopausal state: Secondary | ICD-10-CM | POA: Diagnosis not present

## 2016-01-02 DIAGNOSIS — Z1231 Encounter for screening mammogram for malignant neoplasm of breast: Secondary | ICD-10-CM | POA: Diagnosis not present

## 2016-01-17 ENCOUNTER — Telehealth (HOSPITAL_COMMUNITY): Payer: Self-pay | Admitting: *Deleted

## 2016-01-17 ENCOUNTER — Telehealth: Payer: Self-pay | Admitting: Pulmonary Disease

## 2016-01-17 DIAGNOSIS — I5032 Chronic diastolic (congestive) heart failure: Secondary | ICD-10-CM

## 2016-01-17 NOTE — Telephone Encounter (Signed)
Ok with me 

## 2016-01-17 NOTE — Telephone Encounter (Signed)
Pt called to request referral to pulm rehab, per last OV note will order, order placed pt is aware

## 2016-01-17 NOTE — Telephone Encounter (Signed)
Called spoke with pt. She reports Dr. Haroldine Laws discussed with her about going back to rehab. He was agreeable with her starting back. She feels she would benefit from this. She reports she has called his office to have them place a referral for this. FYI for Dr. Elsworth Soho.

## 2016-01-18 ENCOUNTER — Ambulatory Visit: Payer: Medicare Other | Admitting: Adult Health

## 2016-01-18 NOTE — Telephone Encounter (Signed)
Pt returning call.Nina Howell ° °

## 2016-01-18 NOTE — Telephone Encounter (Signed)
lmtcb x1 for pt. 

## 2016-02-08 DIAGNOSIS — L120 Bullous pemphigoid: Secondary | ICD-10-CM | POA: Diagnosis not present

## 2016-02-17 ENCOUNTER — Other Ambulatory Visit (HOSPITAL_COMMUNITY): Payer: Self-pay | Admitting: Internal Medicine

## 2016-02-20 ENCOUNTER — Encounter (HOSPITAL_COMMUNITY)
Admission: RE | Admit: 2016-02-20 | Discharge: 2016-02-20 | Disposition: A | Discharge status: Home / Self Care | Payer: BLUE CROSS/BLUE SHIELD | Source: Ambulatory Visit | Attending: Internal Medicine | Admitting: Internal Medicine

## 2016-02-20 ENCOUNTER — Encounter (HOSPITAL_COMMUNITY): Payer: Self-pay

## 2016-02-20 VITALS — BP 179/72 | HR 79 | Resp 18 | Ht 60.5 in | Wt 344.4 lb

## 2016-02-20 DIAGNOSIS — Z7951 Long term (current) use of inhaled steroids: Secondary | ICD-10-CM | POA: Diagnosis not present

## 2016-02-20 DIAGNOSIS — E039 Hypothyroidism, unspecified: Secondary | ICD-10-CM | POA: Insufficient documentation

## 2016-02-20 DIAGNOSIS — I110 Hypertensive heart disease with heart failure: Secondary | ICD-10-CM | POA: Diagnosis not present

## 2016-02-20 DIAGNOSIS — E119 Type 2 diabetes mellitus without complications: Secondary | ICD-10-CM | POA: Diagnosis not present

## 2016-02-20 DIAGNOSIS — G4730 Sleep apnea, unspecified: Secondary | ICD-10-CM | POA: Insufficient documentation

## 2016-02-20 DIAGNOSIS — Z79899 Other long term (current) drug therapy: Secondary | ICD-10-CM | POA: Diagnosis not present

## 2016-02-20 DIAGNOSIS — Z794 Long term (current) use of insulin: Secondary | ICD-10-CM | POA: Insufficient documentation

## 2016-02-20 DIAGNOSIS — Z6841 Body Mass Index (BMI) 40.0 and over, adult: Secondary | ICD-10-CM | POA: Insufficient documentation

## 2016-02-20 DIAGNOSIS — E785 Hyperlipidemia, unspecified: Secondary | ICD-10-CM | POA: Diagnosis not present

## 2016-02-20 DIAGNOSIS — I5032 Chronic diastolic (congestive) heart failure: Secondary | ICD-10-CM

## 2016-02-20 DIAGNOSIS — Z7982 Long term (current) use of aspirin: Secondary | ICD-10-CM | POA: Insufficient documentation

## 2016-02-20 NOTE — Progress Notes (Signed)
Nina Howell 75 y.o. female Pulmonary Rehab Orientation Note Patient arrived today in Cardiac and Pulmonary Rehab for orientation to Pulmonary Rehab. She was transported from [employer] via wheel chair. She does not carry portable oxygen. She has not been prescribed oxygen for portable use. Color good, skin warm and dry. Patient is oriented to time and place. Patient's medical history, psychosocial health, and medications reviewed. Psychosocial assessment reveals pt lives with their spouse. Pt is currently retired from [employer]. Pt reports stress stress level is moderate. Areas of stress/anxiety include her husbands forgetfulness. He is her primary caregiver including assisting with baths, cleaning the home, cooking all meals and delivering them upstairs to her. Pt does not exhibit signs of depression. PHQ2/9 score 1/na. Pt shows fair  coping skills with positive outlook. She was offered emotional support and reassurance. Will continue to monitor and evaluate progress toward psychosocial goal(s) of becoming more independent and less dependent on her husband for ADLs. Physical assessment reveals heart rate is normal. Breath sounds diminished due to body habitus. Patient is morbidly obese and because of her ankle and right knee, unable to exercise. Grip strength equal, strong. Distal pulses faint. Patient reports she does not take medications as prescribed. Patient states she follows a Low Sodium, Diabetic diet. The patient reports no specific efforts to gain or lose weight. She has lost 100lbs in the past, but due to inactivity gained it all back. Patient's weight will be monitored closely. Demonstration and practice of PLB using pulse oximeter. Patient able to return demonstration satisfactorily. Safety and hand hygiene in the exercise area reviewed with patient. Patient voices understanding of the information reviewed. Department expectations discussed with patient and achievable  goals were set. The patient shows enthusiasm about attending the program and we look forward to working with this nice lady. The patient is scheduled for a 6 min walk test on Thursday 10/5 and to begin exercise on 10/12.   45 minutes was spent on a variety of activities such as assessment of the patient, obtaining baseline data including height, weight, BMI, and grip strength, verifying medical history, allergies, and current medications, and teaching patient strategies for performing tasks with less respiratory effort with emphasis on pursed lip breathing.

## 2016-02-22 ENCOUNTER — Encounter (HOSPITAL_COMMUNITY): Payer: Self-pay | Admitting: *Deleted

## 2016-02-22 DIAGNOSIS — E039 Hypothyroidism, unspecified: Secondary | ICD-10-CM | POA: Diagnosis not present

## 2016-02-22 DIAGNOSIS — Z23 Encounter for immunization: Secondary | ICD-10-CM | POA: Diagnosis not present

## 2016-02-22 DIAGNOSIS — E1122 Type 2 diabetes mellitus with diabetic chronic kidney disease: Secondary | ICD-10-CM | POA: Diagnosis not present

## 2016-02-22 DIAGNOSIS — E1142 Type 2 diabetes mellitus with diabetic polyneuropathy: Secondary | ICD-10-CM | POA: Diagnosis not present

## 2016-02-22 DIAGNOSIS — E662 Morbid (severe) obesity with alveolar hypoventilation: Secondary | ICD-10-CM | POA: Diagnosis not present

## 2016-02-23 ENCOUNTER — Encounter: Payer: Self-pay | Admitting: Radiology

## 2016-02-23 ENCOUNTER — Encounter (HOSPITAL_COMMUNITY)
Admission: RE | Admit: 2016-02-23 | Discharge: 2016-02-23 | Disposition: A | Discharge status: Home / Self Care | Payer: BLUE CROSS/BLUE SHIELD | Source: Ambulatory Visit | Attending: Internal Medicine | Admitting: Internal Medicine

## 2016-02-23 DIAGNOSIS — Z7982 Long term (current) use of aspirin: Secondary | ICD-10-CM | POA: Diagnosis not present

## 2016-02-23 DIAGNOSIS — I5032 Chronic diastolic (congestive) heart failure: Secondary | ICD-10-CM

## 2016-02-23 DIAGNOSIS — Z794 Long term (current) use of insulin: Secondary | ICD-10-CM | POA: Diagnosis not present

## 2016-02-23 DIAGNOSIS — G4730 Sleep apnea, unspecified: Secondary | ICD-10-CM | POA: Diagnosis not present

## 2016-02-23 DIAGNOSIS — Z6841 Body Mass Index (BMI) 40.0 and over, adult: Secondary | ICD-10-CM | POA: Diagnosis not present

## 2016-02-23 DIAGNOSIS — I110 Hypertensive heart disease with heart failure: Secondary | ICD-10-CM | POA: Diagnosis not present

## 2016-02-23 DIAGNOSIS — Z7951 Long term (current) use of inhaled steroids: Secondary | ICD-10-CM | POA: Diagnosis not present

## 2016-02-23 DIAGNOSIS — E119 Type 2 diabetes mellitus without complications: Secondary | ICD-10-CM | POA: Diagnosis not present

## 2016-02-23 DIAGNOSIS — E785 Hyperlipidemia, unspecified: Secondary | ICD-10-CM | POA: Diagnosis not present

## 2016-02-23 DIAGNOSIS — E039 Hypothyroidism, unspecified: Secondary | ICD-10-CM | POA: Diagnosis not present

## 2016-02-23 DIAGNOSIS — Z79899 Other long term (current) drug therapy: Secondary | ICD-10-CM | POA: Diagnosis not present

## 2016-02-23 NOTE — Progress Notes (Signed)
Pulmonary Individual Treatment Plan  Patient Details  Name: Nina Howell MRN: [id number] Date of Birth: 02/22/1941 Referring Provider:   April Manson Pulmonary Rehab Walk Test from 02/23/2016 in [place]  Referring Provider  Dr. Haroldine Laws      Initial Encounter Date:  Flowsheet Row Pulmonary Rehab Walk Test from 02/23/2016 in [place]  Date  02/23/16  Referring Provider  Dr. Haroldine Laws      Visit Diagnosis: Heart failure, diastolic, chronic ([place])  Patient's Home Medications on Admission:   Current Outpatient Prescriptions:  .  albuterol (PROVENTIL HFA;VENTOLIN HFA) 108 (90 Base) MCG/ACT inhaler, Inhale 2 puffs into the lungs every 4 (four) hours as needed for wheezing or shortness of breath., Disp: , Rfl:  .  aspirin 81 MG tablet, Take 81 mg by mouth daily., Disp: , Rfl:  .  atorvastatin (LIPITOR) 20 MG tablet, Take 20 mg by mouth every other day., Disp: , Rfl:  .  beclomethasone (QVAR) 80 MCG/ACT inhaler, Inhale 2 puffs into the lungs 2 (two) times daily., Disp: , Rfl:  .  Cholecalciferol (VITAMIN D3) 1000 UNITS CAPS, Take 1 tablet by mouth daily.  , Disp: , Rfl:  .  CRANBERRY PO, Take 4 tablets by mouth daily. , Disp: , Rfl:  .  Cyanocobalamin (VITAMIN B-12) 2500 MCG SUBL, Place 1 tablet under the tongue daily., Disp: , Rfl:  .  doxycycline (VIBRA-TABS) 100 MG tablet, Take 100 mg by mouth 2 (two) times daily., Disp: , Rfl:  .  fluocinonide (LIDEX) 0.05 % external solution, Apply 1 application topically 2 (two) times daily as needed (dermatosis). , Disp: , Rfl:  .  insulin NPH-regular Human (NOVOLIN 70/30) (70-30) 100 UNIT/ML injection, Inject 30 Units into the skin 2 (two) times daily with a meal. 30 UNITS IN AM, 25 UNITS IN PM, Disp: , Rfl:  .  levothyroxine (SYNTHROID, LEVOTHROID) 50 MCG tablet, Take 50 mcg by mouth daily.  , Disp: , Rfl:  .  Liraglutide (VICTOZA) 18 MG/3ML SOPN, Inject 1.8 mg into the skin  daily., Disp: , Rfl:  .  metFORMIN (GLUCOPHAGE) 1000 MG tablet, Take 1,000 mg by mouth 2 (two) times daily with a meal., Disp: , Rfl:  .  Multiple Vitamin (MULTIVITAMIN WITH MINERALS) TABS tablet, Take 0.5 tablets by mouth daily., Disp: , Rfl:  .  niacinamide 500 MG tablet, Take 500 mg by mouth 2 (two) times daily with a meal., Disp: , Rfl:  .  polyethylene glycol (MIRALAX / GLYCOLAX) packet, Take 17 g by mouth daily as needed., Disp: 14 each, Rfl: 0 .  potassium chloride SA (K-DUR,KLOR-CON) 20 MEQ tablet, Take 20 mEq by mouth every morning. Take an additional 20 meq tablet if take extra torsemide, Disp: , Rfl:  .  sertraline (ZOLOFT) 50 MG tablet, Take 100 mg by mouth at bedtime. , Disp: , Rfl:  .  spironolactone (ALDACTONE) 25 MG tablet, Take 1 tablet (25 mg total) by mouth daily., Disp: 90 tablet, Rfl: 3 .  torsemide (DEMADEX) 20 MG tablet, TAKE 1 TABLET BY MOUTH TWO  TIMES DAILY, Disp: 180 tablet, Rfl: 3  Past Medical History: Past Medical History:  Diagnosis Date  . Acute pancreatitis   . Anemia   . Asymptomatic cholelithiasis   . Cellulitis    ABDOMINAL WALL  . CHF (congestive heart failure) (H[facility])    DECOMPENSATED SYSTOLIC CHF  . CHF (congestive heart failure) ([place])   . CTS (carpal tunnel syndrome)   .  Diabetes mellitus, type 2 ([place])   . DJD (degenerative joint disease)   . Hepatic lesion   . Hyperlipidemia   . 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 Hypertension   . Hypokalemia   . Hypothyroidism   . Hypoventilation   . IUD (intrauterine device) in place   . Morbid obesity ([place])   . 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 Sleep apnea    cpap  . Varicose veins     Tobacco Use: History  Smoking Status  . Never Smoker  Smokeless Tobacco  . Never Used    Labs: Recent Review Flowsheet Data    Labs for ITP Cardiac and Pulmonary Rehab Latest Ref Rng & Units 03/23/2010 04/10/2010  04/13/2010 08/19/2015 08/19/2015   Hemoglobin A1c <5.7 % 7.2 (NOTE)                                                                       According to the ADA Clinical Practice Recommendations for 2011, when HbA1c is used as a screening test:   >=6.5%   Diagnostic of Diabetes Mellitus           (if abnormal result is confirmed)  5.7-6.4%   Increased risk of developing Diabetes Mellitus  References:Diagnosis and Classification of Diabetes Mellitus,Diabetes [id number] 1):S62-S69 and Standards of Medical Care in         Diabetes - 2011,Diabetes Care,2011,34 (Suppl 1):S11-S61.(H) 7.5 (NOTE)                                                                       According to the ADA Clinical Practice Recommendations for 2011, when HbA1c is used as a screening test:   >=6.5%   Diagnostic of Diabetes Mellitus           (if abnormal result is confirmed)  5.7-6.4%   Increased risk of developing Diabetes Mellitus  References:Diagnosis and Classification of Diabetes Mellitus,Diabetes Care,2011,34(Suppl 1):S62-S69 and Standards of Medical Care in         Diabetes - 2011,Diabetes Care,2011,34 (Suppl 1):S11-S61.(H) - - -   PHART 7.350 - 7.450 - - 7.434(H) 7.344(L) 7.348(L)   PCO2ART 35.0 - 45.0 mmHg - - 63.0 CRITICAL RESULT CALLED TO, READ BACK BY AND VERIFIED WITH: [place], RN AT [id number], BY SHANNON CARTER RRT, RCP ON 04/13/2010(HH) 56.6(H) 55.4(H)   HCO3 20.0 - 24.0 mEq/L - 44.6(H) 41.5(H) 31.0(H) 30.6(H)   TCO2 0 - 100 mmol/L 37 47 43.4 33 32   O2SAT % - 51.0 97.4 95.0 96.0      Capillary Blood Glucose: Lab Results  Component Value Date   GLUCAP 195 (H) 08/27/2015   GLUCAP 200 (H) 08/27/2015   GLUCAP 217 (H) 08/26/2015   GLUCAP 214 (H) 08/26/2015   GLUCAP 189 (H) 08/26/2015     ADL UCSD:     Pulmonary Assessment Scores    Row Name 02/22/16 1617  ADL UCSD   ADL Phase Entry     SOB Score total 83        Pulmonary Function Assessment:     Pulmonary Function Assessment -  02/20/16 1058      Breath   Bilateral Breath Sounds Decreased   Shortness of Breath Limiting activity;Yes      Exercise Target Goals: Date: 02/23/16  Exercise Program Goal: Individual exercise prescription set with THRR, safety & activity barriers. Participant demonstrates ability to understand and report RPE using BORG scale, to self-measure pulse accurately, and to acknowledge the importance of the exercise prescription.  Exercise Prescription Goal: Starting with aerobic activity 30 plus minutes a day, 3 days per week for initial exercise prescription. Provide home exercise prescription and guidelines that participant acknowledges understanding prior to discharge.  Activity Barriers & Risk Stratification:     Activity Barriers & Cardiac Risk Stratification - 02/20/16 1055      Activity Barriers & Cardiac Risk Stratification   Activity Barriers Shortness of Breath;Deconditioning;Joint Problems;Assistive Device      6 Minute Walk:     6 Minute Walk    Row Name 02/23/16 1629         6 Minute Walk   Phase Initial     Distance 300 feet     Walk Time -  3 minutes 35 seconds     # of Rest Breaks 3  2 minutes and 25 seconds all together     MPH 0.56     RPE 17     Perceived Dyspnea  3     Symptoms Yes (comment)     Comments ankle pain     Resting HR 108 bpm     Resting BP 144/84     Max Ex. HR 148 bpm     Max Ex. BP 140/80       Interval HR   Baseline HR 108     1 Minute HR 121     2 Minute HR 124     3 Minute HR 125     4 Minute HR 132     5 Minute HR 148     6 Minute HR 145     2 Minute Post HR 90     Interval Heart Rate? Yes       Interval Oxygen   Interval Oxygen? Yes     Baseline Oxygen Saturation % 95 %     Baseline Liters of Oxygen 0 L     1 Minute Oxygen Saturation % 95 %     1 Minute Liters of Oxygen 0 L     2 Minute Oxygen Saturation % 93 %     2 Minute Liters of Oxygen 0 L     3 Minute Oxygen Saturation % 95 %     3 Minute Liters of Oxygen  0 L     4 Minute Oxygen Saturation % 96 %     4 Minute Liters of Oxygen 0 L     5 Minute Oxygen Saturation % 96 %     5 Minute Liters of Oxygen 0 L     6 Minute Oxygen Saturation % 96 %     6 Minute Liters of Oxygen 0 L     2 Minute Post Oxygen Saturation % 96 %     2 Minute Post Liters of Oxygen 0 L        Initial Exercise Prescription:     Initial Exercise Prescription -  02/23/16 1600      Date of Initial Exercise RX and Referring Provider   Date 02/23/16   Referring Provider Dr. Haroldine Laws     NuStep   Level 1   Minutes 17   METs 1.5     Arm Ergometer   Level 1   Minutes 17     Prescription Details   Frequency (times per week) 2   Duration Progress to 45 minutes of aerobic exercise without signs/symptoms of physical distress     Intensity   THRR 40-80% of Max Heartrate 58-117   Ratings of Perceived Exertion 11-13   Perceived Dyspnea 0-4     Progression   Progression Continue progressive overload as per policy without signs/symptoms or physical distress.     Resistance Training   Training Prescription Yes   Weight green bands   Reps 10-12      Perform Capillary Blood Glucose checks as needed.  Exercise Prescription Changes:   Exercise Comments:   Discharge Exercise Prescription (Final Exercise Prescription Changes):    Nutrition:  Target Goals: Understanding of nutrition guidelines, daily intake of sodium 1500mg , cholesterol 200mg , calories 30% from fat and 7% or less from saturated fats, daily to have 5 or more servings of fruits and vegetables.  Biometrics:     Pre Biometrics - 02/20/16 1059      Pre Biometrics   Grip Strength 27 kg       Nutrition Therapy Plan and Nutrition Goals:   Nutrition Discharge: Rate Your Plate Scores:   Psychosocial: Target Goals: Acknowledge presence or absence of depression, maximize coping skills, provide positive support system. Participant is able to verbalize types and ability to use techniques and  skills needed for reducing stress and depression.  Initial Review & Psychosocial Screening:     Initial Psych Review & Screening - 02/20/16 1059      Initial Review   Current issues with Current Stress Concerns   Source of Stress Concerns Unable to perform yard/household activities;Unable to participate in former interests or hobbies  husbands forgetfulness     [place]? Yes   Concerns Inappropriate over/under dependence on family/friends     Barriers   Psychosocial barriers to participate in program There are no identifiable barriers or psychosocial needs.     Screening Interventions   Interventions Encouraged to exercise      Quality of Life Scores:     Quality of Life - 02/22/16 1617      Quality of Life Scores   Health/Function Pre 13.9 %   Socioeconomic Pre 20.14 %   Psych/Spiritual Pre 24 %   Family Pre 16.88 %   GLOBAL Pre 17.73 %      PHQ-9: Recent Review Flowsheet Data    Depression screen PHQ 2/9 02/20/2016   Decreased Interest 1   Down, Depressed, Hopeless 0   PHQ - 2 Score 1      Psychosocial Evaluation and Intervention:     Psychosocial Evaluation - 02/20/16 1112      Psychosocial Evaluation & Interventions   Interventions Encouraged to exercise with the program and follow exercise prescription      Psychosocial Re-Evaluation:  Education: Education Goals: Education classes will be provided on a weekly basis, covering required topics. Participant will state understanding/return demonstration of topics presented.  Learning Barriers/Preferences:     Learning Barriers/Preferences - 02/20/16 1058      Learning Barriers/Preferences   Learning Barriers None   Learning Preferences Group  Instruction;Individual Instruction;Verbal Instruction;Written Material      Education Topics: Risk Factor Reduction:  -Group instruction that is supported by a PowerPoint presentation. Instructor discusses the definition of a  risk factor, different risk factors for pulmonary disease, and how the heart and lungs work together.     Nutrition for Pulmonary Patient:  -Group instruction provided by PowerPoint slides, verbal discussion, and written materials to support subject matter. The instructor gives an explanation and review of healthy diet recommendations, which includes a discussion on weight management, recommendations for fruit and vegetable consumption, as well as protein, fluid, caffeine, fiber, sodium, sugar, and alcohol. Tips for eating when patients are short of breath are discussed.   Pursed Lip Breathing:  -Group instruction that is supported by demonstration and informational handouts. Instructor discusses the benefits of pursed lip and diaphragmatic breathing and detailed demonstration on how to preform both.     Oxygen Safety:  -Group instruction provided by PowerPoint, verbal discussion, and written material to support subject matter. There is an overview of "What is Oxygen" and "Why do we need it".  Instructor also reviews how to create a safe environment for oxygen use, the importance of using oxygen as prescribed, and the risks of noncompliance. There is a brief discussion on traveling with oxygen and resources the patient may utilize.   Oxygen Equipment:  -Group instruction provided by [facility] Staff utilizing handouts, written materials, and equipment demonstrations.   Signs and Symptoms:  -Group instruction provided by written material and verbal discussion to support subject matter. Warning signs and symptoms of infection, stroke, and heart attack are reviewed and when to call the physician/911 reinforced. Tips for preventing the spread of infection discussed.   Advanced Directives:  -Group instruction provided by verbal instruction and written material to support subject matter. Instructor reviews Advanced Directive laws and proper instruction for filling out document.   Pulmonary  Video:  -Group video education that reviews the importance of medication and oxygen compliance, exercise, good nutrition, pulmonary hygiene, and pursed lip and diaphragmatic breathing for the pulmonary patient.   Exercise for the Pulmonary Patient:  -Group instruction that is supported by a PowerPoint presentation. Instructor discusses benefits of exercise, core components of exercise, frequency, duration, and intensity of an exercise routine, importance of utilizing pulse oximetry during exercise, safety while exercising, and options of places to exercise outside of rehab.     Pulmonary Medications:  -Verbally interactive group education provided by instructor with focus on inhaled medications and proper administration.   Anatomy and Physiology of the Respiratory System and Intimacy:  -Group instruction provided by PowerPoint, verbal discussion, and written material to support subject matter. Instructor reviews respiratory cycle and anatomical components of the respiratory system and their functions. Instructor also reviews differences in obstructive and restrictive respiratory diseases with examples of each. Intimacy, Sex, and Sexuality differences are reviewed with a discussion on how relationships can change when diagnosed with pulmonary disease. Common sexual concerns are reviewed.   Knowledge Questionnaire Score:     Knowledge Questionnaire Score - 02/22/16 1617      Knowledge Questionnaire Score   Pre Score 9/13      Core Components/Risk Factors/Patient Goals at Admission:   Core Components/Risk Factors/Patient Goals Review:    Core Components/Risk Factors/Patient Goals at Discharge (Final Review):    ITP Comments:   Comments:

## 2016-02-23 NOTE — Progress Notes (Signed)
Called patient to scheduled echocardiogram. Patient stated she had an echo in July at the hospital (I verified she did) and does she need another one. She also wanted Dr. Johnsie Cancel to no she would be starting Cardiac and pulmonary rehab 03/01/2016.

## 2016-03-01 ENCOUNTER — Encounter (HOSPITAL_COMMUNITY)
Admission: RE | Admit: 2016-03-01 | Discharge: 2016-03-01 | Disposition: A | Discharge status: Home / Self Care | Payer: BLUE CROSS/BLUE SHIELD | Source: Ambulatory Visit | Attending: Internal Medicine | Admitting: Internal Medicine

## 2016-03-01 VITALS — Wt 346.6 lb

## 2016-03-01 DIAGNOSIS — I110 Hypertensive heart disease with heart failure: Secondary | ICD-10-CM | POA: Diagnosis not present

## 2016-03-01 DIAGNOSIS — Z79899 Other long term (current) drug therapy: Secondary | ICD-10-CM | POA: Diagnosis not present

## 2016-03-01 DIAGNOSIS — I5032 Chronic diastolic (congestive) heart failure: Secondary | ICD-10-CM | POA: Diagnosis not present

## 2016-03-01 DIAGNOSIS — Z6841 Body Mass Index (BMI) 40.0 and over, adult: Secondary | ICD-10-CM | POA: Diagnosis not present

## 2016-03-01 DIAGNOSIS — E119 Type 2 diabetes mellitus without complications: Secondary | ICD-10-CM | POA: Diagnosis not present

## 2016-03-01 DIAGNOSIS — Z7982 Long term (current) use of aspirin: Secondary | ICD-10-CM | POA: Diagnosis not present

## 2016-03-01 DIAGNOSIS — E785 Hyperlipidemia, unspecified: Secondary | ICD-10-CM | POA: Diagnosis not present

## 2016-03-01 DIAGNOSIS — Z7951 Long term (current) use of inhaled steroids: Secondary | ICD-10-CM | POA: Diagnosis not present

## 2016-03-01 DIAGNOSIS — E039 Hypothyroidism, unspecified: Secondary | ICD-10-CM | POA: Diagnosis not present

## 2016-03-01 DIAGNOSIS — G4730 Sleep apnea, unspecified: Secondary | ICD-10-CM | POA: Diagnosis not present

## 2016-03-01 DIAGNOSIS — Z794 Long term (current) use of insulin: Secondary | ICD-10-CM | POA: Diagnosis not present

## 2016-03-01 NOTE — Progress Notes (Signed)
Daily Session Note  Patient Details  Name: Nina Howell MRN: [id number] Date of Birth: 1940/06/07 Referring Provider:   April Manson Pulmonary Rehab Walk Test from 02/23/2016 in [place]  Referring Provider  Dr. Haroldine Laws      Encounter Date: 03/01/2016  Check In:     Session Check In - 03/01/16 1214      Check-In   Location [facility]   Staff Present Rosebud Poles, RN, BSN;Molly diVincenzo, MS, ACSM RCEP, Exercise Physiologist;Lisa Ysidro Evert, RN;Kalli Greenfield Rollene Rotunda, RN, BSN   Supervising physician immediately available to respond to emergencies [facility] immediately available   Physician(s) Dr. Alfredia Ferguson   Medication changes reported     No   Fall or balance concerns reported    No   Warm-up and Cool-down Performed as group-led instruction   Resistance Training Performed Yes   VAD Patient? No     Pain Assessment   Currently in Pain? No/denies   Multiple Pain Sites No      Capillary Blood Glucose: No results found for this or any previous visit (from the past 24 hour(s)).     POCT Glucose - 03/01/16 1229      POCT Blood Glucose   Pre-Exercise 160 mg/dL   Post-Exercise 93 mg/dL         Exercise Prescription Changes - 03/01/16 1232      Exercise Review   Progression Yes     Response to Exercise   Blood Pressure (Admit) 140/66   Blood Pressure (Exercise) 122/66   Blood Pressure (Exit) 126/82   Heart Rate (Admit) 82 bpm   Heart Rate (Exercise) 90 bpm   Heart Rate (Exit) 93 bpm   Oxygen Saturation (Admit) 91 %   Oxygen Saturation (Exercise) 90 %   Oxygen Saturation (Exit) 93 %   Rating of Perceived Exertion (Exercise) 12   Perceived Dyspnea (Exercise) 0   Duration Progress to 45 minutes of aerobic exercise without signs/symptoms of physical distress   Intensity --  40-80% HRR     Progression   Progression Continue to progress workloads to maintain intensity without signs/symptoms of physical distress.      Resistance Training   Training Prescription Yes   Weight green bands   Reps 10-12     Oxygen   Oxygen --  Room Air     NuStep   Level 2   Minutes 34   METs 1.7     Goals Met:  Exercise tolerated well Queuing for purse lip breathing No report of cardiac concerns or symptoms Strength training completed today  Goals Unmet:  Not Applicable  Comments: Service time is from 1030 to 1220   Dr. Rush Farmer is Medical Director for Pulmonary Rehab at [facility].

## 2016-03-06 ENCOUNTER — Encounter (HOSPITAL_COMMUNITY): Payer: BLUE CROSS/BLUE SHIELD

## 2016-03-07 DIAGNOSIS — Z794 Long term (current) use of insulin: Secondary | ICD-10-CM | POA: Diagnosis not present

## 2016-03-07 DIAGNOSIS — E119 Type 2 diabetes mellitus without complications: Secondary | ICD-10-CM | POA: Diagnosis not present

## 2016-03-07 DIAGNOSIS — H2513 Age-related nuclear cataract, bilateral: Secondary | ICD-10-CM | POA: Diagnosis not present

## 2016-03-08 ENCOUNTER — Encounter (HOSPITAL_COMMUNITY)
Admission: RE | Admit: 2016-03-08 | Discharge: 2016-03-08 | Disposition: A | Discharge status: Home / Self Care | Payer: BLUE CROSS/BLUE SHIELD | Source: Ambulatory Visit | Attending: Internal Medicine | Admitting: Internal Medicine

## 2016-03-08 VITALS — Wt 343.3 lb

## 2016-03-08 DIAGNOSIS — I110 Hypertensive heart disease with heart failure: Secondary | ICD-10-CM | POA: Diagnosis not present

## 2016-03-08 DIAGNOSIS — E039 Hypothyroidism, unspecified: Secondary | ICD-10-CM | POA: Diagnosis not present

## 2016-03-08 DIAGNOSIS — E119 Type 2 diabetes mellitus without complications: Secondary | ICD-10-CM | POA: Diagnosis not present

## 2016-03-08 DIAGNOSIS — Z6841 Body Mass Index (BMI) 40.0 and over, adult: Secondary | ICD-10-CM | POA: Diagnosis not present

## 2016-03-08 DIAGNOSIS — Z7951 Long term (current) use of inhaled steroids: Secondary | ICD-10-CM | POA: Diagnosis not present

## 2016-03-08 DIAGNOSIS — Z7982 Long term (current) use of aspirin: Secondary | ICD-10-CM | POA: Diagnosis not present

## 2016-03-08 DIAGNOSIS — I5032 Chronic diastolic (congestive) heart failure: Secondary | ICD-10-CM

## 2016-03-08 DIAGNOSIS — G4730 Sleep apnea, unspecified: Secondary | ICD-10-CM | POA: Diagnosis not present

## 2016-03-08 DIAGNOSIS — Z79899 Other long term (current) drug therapy: Secondary | ICD-10-CM | POA: Diagnosis not present

## 2016-03-08 DIAGNOSIS — Z794 Long term (current) use of insulin: Secondary | ICD-10-CM | POA: Diagnosis not present

## 2016-03-08 DIAGNOSIS — E785 Hyperlipidemia, unspecified: Secondary | ICD-10-CM | POA: Diagnosis not present

## 2016-03-08 LAB — GLUCOSE, CAPILLARY
GLUCOSE-CAPILLARY: 95 mg/dL (ref 65–99)
Glucose-Capillary: 177 mg/dL — ABNORMAL HIGH (ref 65–99)

## 2016-03-08 NOTE — Progress Notes (Signed)
Daily Session Note  Patient Details  Name: Nina Howell MRN: [id number] Date of Birth: 1941/04/23 Referring Provider:   April Manson Pulmonary Rehab Walk Test from 02/23/2016 in [place]  Referring Provider  Dr. Haroldine Laws      Encounter Date: 03/08/2016  Check In:     Session Check In - 03/08/16 1030      Check-In   Location [facility]   Staff Present Rosebud Poles, RN, BSN;Daxtin Leiker, MS, ACSM RCEP, Exercise Physiologist;Annedrea Rosezella [place], RN, MHA;Portia Rollene Rotunda, RN, BSN   Supervising physician immediately available to respond to emergencies [facility] immediately available   Physician(s) Dr. Rockne Menghini   Medication changes reported     No   Fall or balance concerns reported    No   Resistance Training Performed Yes   VAD Patient? No     Pain Assessment   Currently in Pain? No/denies   Multiple Pain Sites No      Capillary Blood Glucose: Results for orders placed or performed during the hospital encounter of 03/08/16 (from the past 24 hour(s))  Glucose, capillary     Status: Abnormal   Collection Time: 03/08/16 10:59 AM  Result Value Ref Range   Glucose-Capillary 177 (H) 65 - 99 mg/dL  Glucose, capillary     Status: None   Collection Time: 03/08/16 11:53 AM  Result Value Ref Range   Glucose-Capillary 95 65 - 99 mg/dL        Exercise Prescription Changes - 03/08/16 1200      Response to Exercise   Blood Pressure (Admit) 128/80   Blood Pressure (Exercise) 122/80   Blood Pressure (Exit) 102/76   Heart Rate (Admit) 91 bpm   Heart Rate (Exercise) 93 bpm   Heart Rate (Exit) 90 bpm   Oxygen Saturation (Admit) 93 %   Oxygen Saturation (Exercise) 90 %   Oxygen Saturation (Exit) 93 %   Rating of Perceived Exertion (Exercise) 11   Perceived Dyspnea (Exercise) 0   Duration Progress to 45 minutes of aerobic exercise without signs/symptoms of physical distress   Intensity --  40-80% HRR     Progression    Progression Continue to progress workloads to maintain intensity without signs/symptoms of physical distress.     Resistance Training   Training Prescription Yes   Weight green bands   Reps 10-12     Oxygen   Oxygen --  Room Air     NuStep   Level 2   Minutes 17   METs 1.7     Arm Ergometer   Level 1   Minutes 17     Goals Met:  Exercise tolerated well No report of cardiac concerns or symptoms Strength training completed today  Goals Unmet:  Not Applicable  Comments: Service time is from 10:30am to 12:00pm   Dr. Rush Farmer is Medical Director for Pulmonary Rehab at [facility].

## 2016-03-13 ENCOUNTER — Encounter (HOSPITAL_COMMUNITY)
Admission: RE | Admit: 2016-03-13 | Discharge: 2016-03-13 | Disposition: A | Discharge status: Home / Self Care | Payer: BLUE CROSS/BLUE SHIELD | Source: Ambulatory Visit | Attending: Internal Medicine | Admitting: Internal Medicine

## 2016-03-13 VITALS — Wt 343.9 lb

## 2016-03-13 DIAGNOSIS — G4730 Sleep apnea, unspecified: Secondary | ICD-10-CM | POA: Diagnosis not present

## 2016-03-13 DIAGNOSIS — Z7951 Long term (current) use of inhaled steroids: Secondary | ICD-10-CM | POA: Diagnosis not present

## 2016-03-13 DIAGNOSIS — I110 Hypertensive heart disease with heart failure: Secondary | ICD-10-CM | POA: Diagnosis not present

## 2016-03-13 DIAGNOSIS — Z79899 Other long term (current) drug therapy: Secondary | ICD-10-CM | POA: Diagnosis not present

## 2016-03-13 DIAGNOSIS — Z6841 Body Mass Index (BMI) 40.0 and over, adult: Secondary | ICD-10-CM | POA: Diagnosis not present

## 2016-03-13 DIAGNOSIS — E039 Hypothyroidism, unspecified: Secondary | ICD-10-CM | POA: Diagnosis not present

## 2016-03-13 DIAGNOSIS — I5032 Chronic diastolic (congestive) heart failure: Secondary | ICD-10-CM

## 2016-03-13 DIAGNOSIS — E785 Hyperlipidemia, unspecified: Secondary | ICD-10-CM | POA: Diagnosis not present

## 2016-03-13 DIAGNOSIS — Z794 Long term (current) use of insulin: Secondary | ICD-10-CM | POA: Diagnosis not present

## 2016-03-13 DIAGNOSIS — Z7982 Long term (current) use of aspirin: Secondary | ICD-10-CM | POA: Diagnosis not present

## 2016-03-13 DIAGNOSIS — E119 Type 2 diabetes mellitus without complications: Secondary | ICD-10-CM | POA: Diagnosis not present

## 2016-03-13 NOTE — Progress Notes (Signed)
Daily Session Note  Patient Details  Name: Nina Howell MRN: [id number] Date of Birth: October 19, 1940 Referring Provider:   April Manson Pulmonary Rehab Walk Test from 02/23/2016 in [place]  Referring Provider  Dr. Haroldine Laws      Encounter Date: 03/13/2016  Check In:     Session Check In - 03/13/16 1020      Check-In   Location [facility]   Staff Present Su Hilt, MS, ACSM RCEP, Exercise Physiologist;Portia Rollene Rotunda, RN, BSN;Veatrice Eckstein Ysidro Evert, RN;Joan Leonia Reeves, RN, BSN   Supervising physician immediately available to respond to emergencies [facility] immediately available   Physician(s) Dr. Rockne Menghini   Medication changes reported     No   Fall or balance concerns reported    No   Warm-up and Cool-down Performed as group-led instruction   Resistance Training Performed Yes   VAD Patient? No     Pain Assessment   Currently in Pain? No/denies   Multiple Pain Sites No      Capillary Blood Glucose: No results found for this or any previous visit (from the past 24 hour(s)).     POCT Glucose - 03/13/16 1209      POCT Blood Glucose   Pre-Exercise 246 mg/dL   Post-Exercise 115 mg/dL         Exercise Prescription Changes - 03/13/16 1200      Exercise Review   Progression Yes     Response to Exercise   Blood Pressure (Admit) 122/70   Blood Pressure (Exercise) 126/60   Blood Pressure (Exit) 108/80   Heart Rate (Admit) 91 bpm   Heart Rate (Exercise) 117 bpm   Heart Rate (Exit) 87 bpm   Oxygen Saturation (Admit) 90 %   Oxygen Saturation (Exercise) 88 %   Oxygen Saturation (Exit) 92 %   Rating of Perceived Exertion (Exercise) 9   Perceived Dyspnea (Exercise) 0   Duration Progress to 45 minutes of aerobic exercise without signs/symptoms of physical distress   Intensity THRR unchanged     Progression   Progression Continue to progress workloads to maintain intensity without signs/symptoms of physical distress.     Resistance Training   Training Prescription Yes   Weight green bands   Reps 10-12  10 minutes of strength training     NuStep   Level 2   Minutes 34   METs 1.8     Arm Ergometer   Level 1   Minutes 17     Goals Met:  Exercise tolerated well No report of cardiac concerns or symptoms Strength training completed today  Goals Unmet:  Not Applicable  Comments: Service time is from 1030 to 1200    Dr. Rush Farmer is Medical Director for Pulmonary Rehab at [facility].

## 2016-03-13 NOTE — Progress Notes (Signed)
Nina Howell 75 y.o. female Nutrition Note Spoke with pt. Pt well-known to this Probation officer from previous admission. Pt is obese. Pt with h/o significant, intentional wt loss. Pt hopes wt loss resumes with addition of Pulmonary Rehab exercise. Pt is making healthy food choices the majority of the time. Pt's husband prepares and portions out most of pt's food. Pt's Rate Your Plate results reviewed with pt. Pt is trying to avoid most salty foods; uses canned/ convenience food infrequently. Pt is diabetic. Pt states her last A1c was "6 something." Dr. Buddy Duty manages pt's DM.  Pt checks CBG's 3-4 times a day. Fasting CBG's reportedly 97 -115 mg/dL before meals over the past several days due to "Dr. Buddy Duty decreased my insulin to 25 units at night so I can exercise." Pt concerned her fasting CBG's were "high." Recommended fasting CBG ranges reviewed with pt. Pt expressed understanding of the information reviewed via feedback method.    Nutrition Diagnosis ? Obesity related to excessive energy intake as evidenced by a BMI of 66.3  Nutrition Intervention ? Pt's individual nutrition plan and goals reviewed with pt. ? Benefits of adopting healthy eating habits discussed when pt's Rate Your Plate reviewed. ? Pt to attend the Nutrition and Lung Disease class ? Continual client-centered nutrition education by RD, as part of interdisciplinary care. Goal(s) 1. Identify food quantities necessary to achieve wt loss of  -2# per week to a goal wt loss of 2.7-10.9 kg (6-24 lb) at graduation from pulmonary rehab. Monitor and Evaluate progress toward nutrition goal with team.   Nina Howell, M.Ed, RD, LDN, CDE 03/13/2016 12:08 PM

## 2016-03-15 ENCOUNTER — Encounter (HOSPITAL_COMMUNITY)
Admission: RE | Admit: 2016-03-15 | Discharge: 2016-03-15 | Disposition: A | Discharge status: Home / Self Care | Payer: BLUE CROSS/BLUE SHIELD | Source: Ambulatory Visit | Attending: Internal Medicine | Admitting: Internal Medicine

## 2016-03-15 VITALS — Wt 342.2 lb

## 2016-03-15 DIAGNOSIS — G4730 Sleep apnea, unspecified: Secondary | ICD-10-CM | POA: Diagnosis not present

## 2016-03-15 DIAGNOSIS — E039 Hypothyroidism, unspecified: Secondary | ICD-10-CM | POA: Diagnosis not present

## 2016-03-15 DIAGNOSIS — Z6841 Body Mass Index (BMI) 40.0 and over, adult: Secondary | ICD-10-CM | POA: Diagnosis not present

## 2016-03-15 DIAGNOSIS — E119 Type 2 diabetes mellitus without complications: Secondary | ICD-10-CM | POA: Diagnosis not present

## 2016-03-15 DIAGNOSIS — Z79899 Other long term (current) drug therapy: Secondary | ICD-10-CM | POA: Diagnosis not present

## 2016-03-15 DIAGNOSIS — I5032 Chronic diastolic (congestive) heart failure: Secondary | ICD-10-CM

## 2016-03-15 DIAGNOSIS — I110 Hypertensive heart disease with heart failure: Secondary | ICD-10-CM | POA: Diagnosis not present

## 2016-03-15 DIAGNOSIS — E785 Hyperlipidemia, unspecified: Secondary | ICD-10-CM | POA: Diagnosis not present

## 2016-03-15 DIAGNOSIS — Z794 Long term (current) use of insulin: Secondary | ICD-10-CM | POA: Diagnosis not present

## 2016-03-15 DIAGNOSIS — Z7982 Long term (current) use of aspirin: Secondary | ICD-10-CM | POA: Diagnosis not present

## 2016-03-15 DIAGNOSIS — Z7951 Long term (current) use of inhaled steroids: Secondary | ICD-10-CM | POA: Diagnosis not present

## 2016-03-15 NOTE — Progress Notes (Addendum)
Daily Session Note  Patient Details  Name: Nina Howell MRN: [id number] Date of Birth: 1940-09-19 Referring Provider:   April Manson Pulmonary Rehab Walk Test from 02/23/2016 in [place]  Referring Provider  Dr. Haroldine Laws      Encounter Date: 03/15/2016  Check In:     Session Check In - 03/15/16 1013      Check-In   Location [facility]   Staff Present Rosebud Poles, RN, BSN;Kamaiya Antilla, MS, ACSM RCEP, Exercise Physiologist;Lisa Ysidro Evert, RN;Portia Rollene Rotunda, RN, BSN   Supervising physician immediately available to respond to emergencies [facility] immediately available   Physician(s) Dr. Alfredia Ferguson   Medication changes reported     No   Fall or balance concerns reported    No   Warm-up and Cool-down Performed as group-led instruction   Resistance Training Performed Yes   VAD Patient? No     Pain Assessment   Currently in Pain? No/denies   Multiple Pain Sites No      Capillary Blood Glucose: No results found for this or any previous visit (from the past 24 hour(s)).     POCT Glucose - 03/15/16 1254      POCT Blood Glucose   Pre-Exercise 155 mg/dL   Post-Exercise 128 mg/dL         Exercise Prescription Changes - 03/15/16 1200      Response to Exercise   Blood Pressure (Admit) 120/70   Blood Pressure (Exercise) 106/66   Blood Pressure (Exit) 104/60   Heart Rate (Admit) 91 bpm   Heart Rate (Exercise) 93 bpm   Heart Rate (Exit) 84 bpm   Oxygen Saturation (Admit) 89 %   Oxygen Saturation (Exercise) 91 %   Oxygen Saturation (Exit) 94 %   Rating of Perceived Exertion (Exercise) 9   Perceived Dyspnea (Exercise) 0   Duration Progress to 45 minutes of aerobic exercise without signs/symptoms of physical distress   Intensity THRR unchanged     Progression   Progression Continue to progress workloads to maintain intensity without signs/symptoms of physical distress.     Resistance Training   Training  Prescription Yes   Weight green bands   Reps 10-12  10 minutes of strength training     NuStep   Level 2   Minutes 34   METs 1.8     Goals Met:  Exercise tolerated well No report of cardiac concerns or symptoms Strength training completed today  Goals Unmet:  Not Applicable  Comments: Service time is from 10:30am to 12:30pm  Patient attended education today with Dr. Nelda Marseille   Dr. Rush Farmer is Medical Director for Pulmonary Rehab at [facility].

## 2016-03-19 DIAGNOSIS — G4733 Obstructive sleep apnea (adult) (pediatric): Secondary | ICD-10-CM | POA: Diagnosis not present

## 2016-03-20 ENCOUNTER — Encounter (HOSPITAL_COMMUNITY): Payer: BLUE CROSS/BLUE SHIELD

## 2016-03-20 ENCOUNTER — Telehealth: Payer: Self-pay

## 2016-03-20 NOTE — Telephone Encounter (Signed)
Phone call from pt.  Reported she is in Cardio- Pulmonary Rehab., and has been using the "New Step", since last Thurs.  Reported she began having increased pain on the lateral side right knee moving down below the knee.  Stated the pain started on Thursday, 10/26, but got a lot worse last night.  Reported she can hardly walk. Reported she has been told her right knee is "bone on bone."  Denied any swelling of the right lower extremity.  Denied any change in temperature of right lower extremity today, but stated it felt warm yesterday.  Denied any change in color of the skin of right LE.  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  Informed that her PCP or Orthopedist can determine if she will also need to see Vascular surgery.  Pt. Verb. Understanding.

## 2016-03-21 DIAGNOSIS — L120 Bullous pemphigoid: Secondary | ICD-10-CM | POA: Diagnosis not present

## 2016-03-22 ENCOUNTER — Encounter (HOSPITAL_COMMUNITY): Admission: RE | Admit: 2016-03-22 | Payer: BLUE CROSS/BLUE SHIELD | Source: Ambulatory Visit

## 2016-03-23 ENCOUNTER — Encounter: Payer: Self-pay | Admitting: Pulmonary Disease

## 2016-03-23 ENCOUNTER — Telehealth: Payer: Self-pay | Admitting: Pulmonary Disease

## 2016-03-23 ENCOUNTER — Ambulatory Visit (INDEPENDENT_AMBULATORY_CARE_PROVIDER_SITE_OTHER): Payer: BLUE CROSS/BLUE SHIELD | Admitting: Pulmonary Disease

## 2016-03-23 DIAGNOSIS — I272 Pulmonary hypertension, unspecified: Secondary | ICD-10-CM | POA: Diagnosis not present

## 2016-03-23 DIAGNOSIS — G4733 Obstructive sleep apnea (adult) (pediatric): Secondary | ICD-10-CM | POA: Diagnosis not present

## 2016-03-23 NOTE — Progress Notes (Signed)
   Subjective:    Patient ID: Nina Howell, female    DOB: [birth year], 75 y.o.   MRN: [id number]  HPI  75/F, Never smoker morbidly obese for FU of obstructive sleep apnea/ OHS .  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 Follow-up    Pt. states her breathing is doing well, Pt. states she is having trouble at Rehab because of her knees,    Compliant with BiPAP by her report and on download, no residuals excellent compliance more than 8 hours per night average 13/9 No problems with mask, pressure or supplies  She is trying her best with diet and exercise-but the scale has not changed. She is very frustrated about this. She continues in pulmonary rehabilitation. She cannot do water aerobics due to bullous pemphigoid   She remainson-torsemide 20 twice daily. Repeat echo 11/2015 showed improvement in pulmonary pressures She transiently required oxygen and is now off     Significant tests/ events  08/19/2015  admission for acute respiratory failure with hypoxia and hypercarbia thought to be multifactorial combination of COPD/asthma/CHF ( likely 2/2 increased pulmonary hypertension in context of obesity and COPD ).   08/2015:2-D echo shows severely dilated RV with severely reduced RV systolic function(new-not seen on prior Echo) PA pressure of 69 ANA 1: 160 speckled, other autoimmune workup negative VQ scan and CT angiogram did not show pulmonary embolus. 11/2015 repeat echo >>. Pressures decreased  Adm 03/2010 with worsening dyspnea &hypoxia, VQ neg, BNP 103, diuresed, ABG 7.43/63/89/97% on 2 L, discharged on 2 L &Bilevel 14/8  PSG 04/2010 showed moderate obstructive sleep apnea with AHi 15/h.  Echo july'11 >>EF 45-50%septal/ inferoseptal hypokinesis [name])    05/2011 hysterescopy- dnc and polyp resection performed under spinal anesthesia , uneventful   Review of  Systems Patient denies significant dyspnea,cough, hemoptysis,  chest pain, palpitations, pedal edema, orthopnea, paroxysmal nocturnal dyspnea, lightheadedness, nausea, vomiting, abdominal or  leg pains      Objective:   Physical Exam  Gen. Pleasant, obese, in no distress ENT - no lesions, no post nasal drip Neck: No JVD, no thyromegaly, no carotid bruits Lungs: no use of accessory muscles, no dullness to percussion, decreased without rales or rhonchi  Cardiovascular: Rhythm regular, heart sounds  normal, no murmurs or gallops, no peripheral edema Musculoskeletal: No deformities, no cyanosis or clubbing , no tremors       Assessment & Plan:

## 2016-03-23 NOTE — Telephone Encounter (Signed)
Called and spoke with pt and advised her that her immunization record has been updated with these vaccines.

## 2016-03-23 NOTE — Assessment & Plan Note (Signed)
Continue your diet and exercise program Call  as needed if breathing is worse  Weight loss encouraged, compliance with goal of at least 4-6 hrs every night is the expectation. Advised against medications with sedative side effects Cautioned against driving when sleepy - understanding that sleepiness will vary on a day to day basis

## 2016-03-23 NOTE — Patient Instructions (Addendum)
Continue your diet and exercise program Call  as needed if breathing is worse

## 2016-03-23 NOTE — Assessment & Plan Note (Signed)
Repeat echo shows drop in pulmonary pressures-this is secondary to diastolic dysfunction and possibly OSA/OHS No indication for pulmonary vasodilators

## 2016-03-27 ENCOUNTER — Encounter (HOSPITAL_COMMUNITY)
Admission: RE | Admit: 2016-03-27 | Discharge: 2016-03-27 | Disposition: A | Discharge status: Home / Self Care | Payer: BLUE CROSS/BLUE SHIELD | Source: Ambulatory Visit | Attending: Internal Medicine | Admitting: Internal Medicine

## 2016-03-27 DIAGNOSIS — Z79899 Other long term (current) drug therapy: Secondary | ICD-10-CM | POA: Insufficient documentation

## 2016-03-27 DIAGNOSIS — E039 Hypothyroidism, unspecified: Secondary | ICD-10-CM | POA: Insufficient documentation

## 2016-03-27 DIAGNOSIS — Z794 Long term (current) use of insulin: Secondary | ICD-10-CM | POA: Insufficient documentation

## 2016-03-27 DIAGNOSIS — E119 Type 2 diabetes mellitus without complications: Secondary | ICD-10-CM | POA: Insufficient documentation

## 2016-03-27 DIAGNOSIS — G4730 Sleep apnea, unspecified: Secondary | ICD-10-CM | POA: Insufficient documentation

## 2016-03-27 DIAGNOSIS — Z6841 Body Mass Index (BMI) 40.0 and over, adult: Secondary | ICD-10-CM | POA: Insufficient documentation

## 2016-03-27 DIAGNOSIS — E785 Hyperlipidemia, unspecified: Secondary | ICD-10-CM | POA: Insufficient documentation

## 2016-03-27 DIAGNOSIS — I110 Hypertensive heart disease with heart failure: Secondary | ICD-10-CM | POA: Insufficient documentation

## 2016-03-27 DIAGNOSIS — Z7951 Long term (current) use of inhaled steroids: Secondary | ICD-10-CM | POA: Insufficient documentation

## 2016-03-27 DIAGNOSIS — I5032 Chronic diastolic (congestive) heart failure: Secondary | ICD-10-CM

## 2016-03-27 DIAGNOSIS — Z7982 Long term (current) use of aspirin: Secondary | ICD-10-CM | POA: Insufficient documentation

## 2016-03-27 NOTE — Progress Notes (Signed)
Pulmonary Individual Treatment Plan  Patient Details  Name: Nina Howell MRN: [id number] Date of Birth: 02/22/1941 Referring Provider:   April Manson Pulmonary Rehab Walk Test from 02/23/2016 in [place]  Referring Provider  Dr. Haroldine Laws      Initial Encounter Date:  Flowsheet Row Pulmonary Rehab Walk Test from 02/23/2016 in [place]  Date  02/23/16  Referring Provider  Dr. Haroldine Laws      Visit Diagnosis: Heart failure, diastolic, chronic ([place])  Patient's Home Medications on Admission:   Current Outpatient Prescriptions:  .  albuterol (PROVENTIL HFA;VENTOLIN HFA) 108 (90 Base) MCG/ACT inhaler, Inhale 2 puffs into the lungs every 4 (four) hours as needed for wheezing or shortness of breath., Disp: , Rfl:  .  aspirin 81 MG tablet, Take 81 mg by mouth daily., Disp: , Rfl:  .  atorvastatin (LIPITOR) 20 MG tablet, Take 20 mg by mouth every other day., Disp: , Rfl:  .  beclomethasone (QVAR) 80 MCG/ACT inhaler, Inhale 2 puffs into the lungs 2 (two) times daily., Disp: , Rfl:  .  Cholecalciferol (VITAMIN D3) 1000 UNITS CAPS, Take 1 tablet by mouth daily.  , Disp: , Rfl:  .  CRANBERRY PO, Take 4 tablets by mouth daily. , Disp: , Rfl:  .  Cyanocobalamin (VITAMIN B-12) 2500 MCG SUBL, Place 1 tablet under the tongue daily., Disp: , Rfl:  .  doxycycline (VIBRA-TABS) 100 MG tablet, Take 100 mg by mouth 2 (two) times daily., Disp: , Rfl:  .  fluocinonide (LIDEX) 0.05 % external solution, Apply 1 application topically 2 (two) times daily as needed (dermatosis). , Disp: , Rfl:  .  insulin NPH-regular Human (NOVOLIN 70/30) (70-30) 100 UNIT/ML injection, Inject 30 Units into the skin 2 (two) times daily with a meal. 30 UNITS IN AM, 25 UNITS IN PM, Disp: , Rfl:  .  levothyroxine (SYNTHROID, LEVOTHROID) 50 MCG tablet, Take 50 mcg by mouth daily.  , Disp: , Rfl:  .  Liraglutide (VICTOZA) 18 MG/3ML SOPN, Inject 1.8 mg into the skin  daily., Disp: , Rfl:  .  metFORMIN (GLUCOPHAGE) 1000 MG tablet, Take 1,000 mg by mouth 2 (two) times daily with a meal., Disp: , Rfl:  .  Multiple Vitamin (MULTIVITAMIN WITH MINERALS) TABS tablet, Take 0.5 tablets by mouth daily., Disp: , Rfl:  .  niacinamide 500 MG tablet, Take 500 mg by mouth 2 (two) times daily with a meal., Disp: , Rfl:  .  polyethylene glycol (MIRALAX / GLYCOLAX) packet, Take 17 g by mouth daily as needed., Disp: 14 each, Rfl: 0 .  potassium chloride SA (K-DUR,KLOR-CON) 20 MEQ tablet, Take 20 mEq by mouth every morning. Take an additional 20 meq tablet if take extra torsemide, Disp: , Rfl:  .  sertraline (ZOLOFT) 50 MG tablet, Take 100 mg by mouth at bedtime. , Disp: , Rfl:  .  spironolactone (ALDACTONE) 25 MG tablet, Take 1 tablet (25 mg total) by mouth daily., Disp: 90 tablet, Rfl: 3 .  torsemide (DEMADEX) 20 MG tablet, TAKE 1 TABLET BY MOUTH TWO  TIMES DAILY, Disp: 180 tablet, Rfl: 3  Past Medical History: Past Medical History:  Diagnosis Date  . Acute pancreatitis   . Anemia   . Asymptomatic cholelithiasis   . Cellulitis    ABDOMINAL WALL  . CHF (congestive heart failure) (HCC)    DECOMPENSATED SYSTOLIC CHF  . CHF (congestive heart failure) ([place])   . CTS (carpal tunnel syndrome)   .  Diabetes mellitus, type 2 ([place])   . DJD (degenerative joint disease)   . Hepatic lesion   . Hyperlipidemia   . 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 Hypertension   . Hypokalemia   . Hypothyroidism   . Hypoventilation   . IUD (intrauterine device) in place   . Morbid obesity ([place])   . 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 Sleep apnea    cpap  . Varicose veins     Tobacco Use: History  Smoking Status  . Never Smoker  Smokeless Tobacco  . Never Used    Labs: Recent Review Flowsheet Data    Labs for ITP Cardiac and Pulmonary Rehab Latest Ref Rng & Units 03/23/2010 04/10/2010  04/13/2010 08/19/2015 08/19/2015   Hemoglobin A1c <5.7 % 7.2 (NOTE)                                                                       According to the ADA Clinical Practice Recommendations for 2011, when HbA1c is used as a screening test:   >=6.5%   Diagnostic of Diabetes Mellitus           (if abnormal result is confirmed)  5.7-6.4%   Increased risk of developing Diabetes Mellitus  References:Diagnosis and Classification of Diabetes Mellitus,Diabetes ZHGD,9242,68(TMHDQ 1):S62-S69 and Standards of Medical Care in         Diabetes - 2011,Diabetes Care,2011,34 (Suppl 1):S11-S61.(H) 7.5 (NOTE)                                                                       According to the ADA Clinical Practice Recommendations for 2011, when HbA1c is used as a screening test:   >=6.5%   Diagnostic of Diabetes Mellitus           (if abnormal result is confirmed)  5.7-6.4%   Increased risk of developing Diabetes Mellitus  References:Diagnosis and Classification of Diabetes Mellitus,Diabetes Care,2011,34(Suppl 1):S62-S69 and Standards of Medical Care in         Diabetes - 2011,Diabetes Care,2011,34 (Suppl 1):S11-S61.(H) - - -   PHART 7.350 - 7.450 - - 7.434(H) 7.344(L) 7.348(L)   PCO2ART 35.0 - 45.0 mmHg - - 63.0 CRITICAL RESULT CALLED TO, READ BACK BY AND VERIFIED WITH: [place], RN AT 2229, BY SHANNON CARTER RRT, RCP ON 04/13/2010(HH) 56.6(H) 55.4(H)   HCO3 20.0 - 24.0 mEq/L - 44.6(H) 41.5(H) 31.0(H) 30.6(H)   TCO2 0 - 100 mmol/L 37 47 43.4 33 32   O2SAT % - 51.0 97.4 95.0 96.0      Capillary Blood Glucose: Lab Results  Component Value Date   GLUCAP 95 03/08/2016   GLUCAP 177 (H) 03/08/2016   GLUCAP 195 (H) 08/27/2015   GLUCAP 200 (H) 08/27/2015   GLUCAP 217 (H) 08/26/2015       POCT Glucose    Row Name 03/01/16 1229 03/08/16 1207 03/13/16 1209 03/15/16 1254  POCT Blood Glucose   Pre-Exercise 160 mg/dL 177 mg/dL 246 mg/dL 155 mg/dL    Post-Exercise 93 mg/dL 95 mg/dL 115 mg/dL 128 mg/dL        ADL UCSD:     Pulmonary Assessment Scores    Row Name 02/22/16 1617         ADL UCSD   ADL Phase Entry     SOB Score total 83        Pulmonary Function Assessment:     Pulmonary Function Assessment - 02/20/16 1058      Breath   Bilateral Breath Sounds Decreased   Shortness of Breath Limiting activity;Yes      Exercise Target Goals:    Exercise Program Goal: Individual exercise prescription set with THRR, safety & activity barriers. Participant demonstrates ability to understand and report RPE using BORG scale, to self-measure pulse accurately, and to acknowledge the importance of the exercise prescription.  Exercise Prescription Goal: Starting with aerobic activity 30 plus minutes a day, 3 days per week for initial exercise prescription. Provide home exercise prescription and guidelines that participant acknowledges understanding prior to discharge.  Activity Barriers & Risk Stratification:     Activity Barriers & Cardiac Risk Stratification - 02/20/16 1055      Activity Barriers & Cardiac Risk Stratification   Activity Barriers Shortness of Breath;Deconditioning;Joint Problems;Assistive Device      6 Minute Walk:     6 Minute Walk    Row Name 02/23/16 1629         6 Minute Walk   Phase Initial     Distance 300 feet     Walk Time -  3 minutes 35 seconds     # of Rest Breaks 3  2 minutes and 25 seconds all together     MPH 0.56     RPE 17     Perceived Dyspnea  3     Symptoms Yes (comment)     Comments ankle pain     Resting HR 108 bpm     Resting BP 144/84     Max Ex. HR 148 bpm     Max Ex. BP 140/80       Interval HR   Baseline HR 108     1 Minute HR 121     2 Minute HR 124     3 Minute HR 125     4 Minute HR 132     5 Minute HR 148     6 Minute HR 145     2 Minute Post HR 90     Interval Heart Rate? Yes       Interval Oxygen   Interval Oxygen? Yes     Baseline Oxygen Saturation % 95 %     Baseline Liters of Oxygen 0 L      1 Minute Oxygen Saturation % 95 %     1 Minute Liters of Oxygen 0 L     2 Minute Oxygen Saturation % 93 %     2 Minute Liters of Oxygen 0 L     3 Minute Oxygen Saturation % 95 %     3 Minute Liters of Oxygen 0 L     4 Minute Oxygen Saturation % 96 %     4 Minute Liters of Oxygen 0 L     5 Minute Oxygen Saturation % 96 %     5 Minute Liters of Oxygen 0 L     6 Minute Oxygen  Saturation % 96 %     6 Minute Liters of Oxygen 0 L     2 Minute Post Oxygen Saturation % 96 %     2 Minute Post Liters of Oxygen 0 L        Initial Exercise Prescription:     Initial Exercise Prescription - 02/23/16 1600      Date of Initial Exercise RX and Referring Provider   Date 02/23/16   Referring Provider Dr. Haroldine Laws     NuStep   Level 1   Minutes 17   METs 1.5     Arm Ergometer   Level 1   Minutes 17     Prescription Details   Frequency (times per week) 2   Duration Progress to 45 minutes of aerobic exercise without signs/symptoms of physical distress     Intensity   THRR 40-80% of Max Heartrate 58-117   Ratings of Perceived Exertion 11-13   Perceived Dyspnea 0-4     Progression   Progression Continue progressive overload as per policy without signs/symptoms or physical distress.     Resistance Training   Training Prescription Yes   Weight green bands   Reps 10-12      Perform Capillary Blood Glucose checks as needed.  Exercise Prescription Changes:     Exercise Prescription Changes    Row Name 03/01/16 1232 03/08/16 1200 03/13/16 1200 03/15/16 1200       Exercise Review   Progression Yes  - Yes  -      Response to Exercise   Blood Pressure (Admit) 140/66 128/80 122/70 120/70    Blood Pressure (Exercise) 122/66 122/80 126/60 106/66    Blood Pressure (Exit) 126/82 102/76 108/80 104/60    Heart Rate (Admit) 82 bpm 91 bpm 91 bpm 91 bpm    Heart Rate (Exercise) 90 bpm 93 bpm 117 bpm 93 bpm    Heart Rate (Exit) 93 bpm 90 bpm 87 bpm 84 bpm    Oxygen Saturation (Admit) 91  % 93 % 90 % 89 %    Oxygen Saturation (Exercise) 90 % 90 % 88 % 91 %    Oxygen Saturation (Exit) 93 % 93 % 92 % 94 %    Rating of Perceived Exertion (Exercise) _0 Perceived Dyspnea (Exercise) 0 0 0 0    Duration Progress to 45 minutes of aerobic exercise without signs/symptoms of physical distress Progress to 45 minutes of aerobic exercise without signs/symptoms of physical distress Progress to 45 minutes of aerobic exercise without signs/symptoms of physical distress Progress to 45 minutes of aerobic exercise without signs/symptoms of physical distress    Intensity -  40-80% HRR -  40-80% HRR THRR unchanged THRR unchanged      Progression   Progression Continue to progress workloads to maintain intensity without signs/symptoms of physical distress. Continue to progress workloads to maintain intensity without signs/symptoms of physical distress. Continue to progress workloads to maintain intensity without signs/symptoms of physical distress. Continue to progress workloads to maintain intensity without signs/symptoms of physical distress.      Resistance Training   Training Prescription Yes Yes Yes Yes    Weight green bands green bands green bands green bands    Reps 10-12 10-12 10-12  10 minutes of strength training 10-12  10 minutes of strength training      Oxygen   Oxygen -  Room Air -  Room Air  -  -  NuStep   Level _0 Minutes 34 17 34 34    METs 1.7 1.7 1.8 1.8      Arm Ergometer   Level  - 1 1  -    Minutes  - 17 17  -       Exercise Comments:     Exercise Comments    Row Name 03/27/16 0721           Exercise Comments Patient has only attended four exercise sessions. Will cont. to monitor and progress.           Discharge Exercise Prescription (Final Exercise Prescription Changes):     Exercise Prescription Changes - 03/15/16 1200      Response to Exercise   Blood Pressure (Admit) 120/70   Blood Pressure (Exercise) 106/66   Blood  Pressure (Exit) 104/60   Heart Rate (Admit) 91 bpm   Heart Rate (Exercise) 93 bpm   Heart Rate (Exit) 84 bpm   Oxygen Saturation (Admit) 89 %   Oxygen Saturation (Exercise) 91 %   Oxygen Saturation (Exit) 94 %   Rating of Perceived Exertion (Exercise) 9   Perceived Dyspnea (Exercise) 0   Duration Progress to 45 minutes of aerobic exercise without signs/symptoms of physical distress   Intensity THRR unchanged     Progression   Progression Continue to progress workloads to maintain intensity without signs/symptoms of physical distress.     Resistance Training   Training Prescription Yes   Weight green bands   Reps 10-12  10 minutes of strength training     NuStep   Level 2   Minutes 34   METs 1.8       Nutrition:  Target Goals: Understanding of nutrition guidelines, daily intake of sodium <1577m, cholesterol <2066m calories 30% from fat and 7% or less from saturated fats, daily to have 5 or more servings of fruits and vegetables.  Biometrics:     Pre Biometrics - 02/20/16 1059      Pre Biometrics   Grip Strength 27 kg       Nutrition Therapy Plan and Nutrition Goals:     Nutrition Therapy & Goals - 03/13/16 1216      Nutrition Therapy   Diet Carb Modified, Low Sodium     Personal Nutrition Goals   Personal Goal #1 1-2 lb wt loss/week to a wt loss goal of 6-24 lb at graduation from [facility] and counsel regarding individualized specific dietary modifications aiming towards targeted core components such as weight, hypertension, lipid management, diabetes, heart failure and other comorbidities.   Expected Outcomes Short Term Goal: Understand basic principles of dietary content, such as calories, fat, sodium, cholesterol and nutrients.;Long Term Goal: Adherence to prescribed nutrition plan.      Nutrition Discharge: Rate Your Plate Scores:     Nutrition Assessments - 03/13/16 1216      Rate Your  Plate Scores   Pre Score 64      Psychosocial: Target Goals: Acknowledge presence or absence of depression, maximize coping skills, provide positive support system. Participant is able to verbalize types and ability to use techniques and skills needed for reducing stress and depression.  Initial Review & Psychosocial Screening:     Initial Psych Review & Screening - 02/20/16 1059      Initial Review   Current issues with Current Stress Concerns   Source of Stress Concerns Unable  to perform yard/household activities;Unable to participate in former interests or hobbies  husbands forgetfulness     [place]? Yes   Concerns Inappropriate over/under dependence on family/friends     Barriers   Psychosocial barriers to participate in program There are no identifiable barriers or psychosocial needs.     Screening Interventions   Interventions Encouraged to exercise      Quality of Life Scores:     Quality of Life - 02/22/16 1617      Quality of Life Scores   Health/Function Pre 13.9 %   Socioeconomic Pre 20.14 %   Psych/Spiritual Pre 24 %   Family Pre 16.88 %   GLOBAL Pre 17.73 %      PHQ-9: Recent Review Flowsheet Data    Depression screen PHQ 2/9 02/20/2016   Decreased Interest 1   Down, Depressed, Hopeless 0   PHQ - 2 Score 1      Psychosocial Evaluation and Intervention:     Psychosocial Evaluation - 02/20/16 1112      Psychosocial Evaluation & Interventions   Interventions Encouraged to exercise with the program and follow exercise prescription      Psychosocial Re-Evaluation:     Psychosocial Re-Evaluation    [place] Name 03/26/16 1647             Psychosocial Re-Evaluation   Interventions Encouraged to attend Pulmonary Rehabilitation for the exercise       Comments Patient is over dependent on husband. see comments section on ITP         Education: Education Goals: Education classes will be provided on a weekly  basis, covering required topics. Participant will state understanding/return demonstration of topics presented.  Learning Barriers/Preferences:     Learning Barriers/Preferences - 02/20/16 1058      Learning Barriers/Preferences   Learning Barriers None   Learning Preferences Group Instruction;Individual Instruction;Verbal Instruction;Written Material      Education Topics: Risk Factor Reduction:  -Group instruction that is supported by a PowerPoint presentation. Instructor discusses the definition of a risk factor, different risk factors for pulmonary disease, and how the heart and lungs work together.     Nutrition for Pulmonary Patient:  -Group instruction provided by PowerPoint slides, verbal discussion, and written materials to support subject matter. The instructor gives an explanation and review of healthy diet recommendations, which includes a discussion on weight management, recommendations for fruit and vegetable consumption, as well as protein, fluid, caffeine, fiber, sodium, sugar, and alcohol. Tips for eating when patients are short of breath are discussed.   Pursed Lip Breathing:  -Group instruction that is supported by demonstration and informational handouts. Instructor discusses the benefits of pursed lip and diaphragmatic breathing and detailed demonstration on how to preform both.   Flowsheet Row PULMONARY REHAB OTHER RESPIRATORY from 03/08/2016 in [place]  Date  03/01/16  Educator  EP  Instruction Review Code  2- meets goals/outcomes      Oxygen Safety:  -Group instruction provided by PowerPoint, verbal discussion, and written material to support subject matter. There is an overview of "What is Oxygen" and "Why do we need it".  Instructor also reviews how to create a safe environment for oxygen use, the importance of using oxygen as prescribed, and the risks of noncompliance. There is a brief discussion on traveling with oxygen  and resources the patient may utilize.   Oxygen Equipment:  -Group instruction provided by [facility] Staff utilizing handouts, written  materials, and Insurance underwriter.   Signs and Symptoms:  -Group instruction provided by written material and verbal discussion to support subject matter. Warning signs and symptoms of infection, stroke, and heart attack are reviewed and when to call the physician/911 reinforced. Tips for preventing the spread of infection discussed.   Advanced Directives:  -Group instruction provided by verbal instruction and written material to support subject matter. Instructor reviews Advanced Directive laws and proper instruction for filling out document.   Pulmonary Video:  -Group video education that reviews the importance of medication and oxygen compliance, exercise, good nutrition, pulmonary hygiene, and pursed lip and diaphragmatic breathing for the pulmonary patient. Flowsheet Row PULMONARY REHAB OTHER RESPIRATORY from 03/08/2016 in [place]  Date  03/08/16  Educator  video  Instruction Review Code  2- meets goals/outcomes      Exercise for the Pulmonary Patient:  -Group instruction that is supported by a PowerPoint presentation. Instructor discusses benefits of exercise, core components of exercise, frequency, duration, and intensity of an exercise routine, importance of utilizing pulse oximetry during exercise, safety while exercising, and options of places to exercise outside of rehab.     Pulmonary Medications:  -Verbally interactive group education provided by instructor with focus on inhaled medications and proper administration.   Anatomy and Physiology of the Respiratory System and Intimacy:  -Group instruction provided by PowerPoint, verbal discussion, and written material to support subject matter. Instructor reviews respiratory cycle and anatomical components of the respiratory system and their  functions. Instructor also reviews differences in obstructive and restrictive respiratory diseases with examples of each. Intimacy, Sex, and Sexuality differences are reviewed with a discussion on how relationships can change when diagnosed with pulmonary disease. Common sexual concerns are reviewed.   Knowledge Questionnaire Score:     Knowledge Questionnaire Score - 02/22/16 1617      Knowledge Questionnaire Score   Pre Score 9/13      Core Components/Risk Factors/Patient Goals at Admission:     Personal Goals and Risk Factors at Admission - 03/26/16 1641      Core Components/Risk Factors/Patient Goals on Admission    Weight Management Obesity;Yes   Intervention Obesity: Provide education and appropriate resources to help participant work on and attain dietary goals.;Weight Management/Obesity: Establish reasonable short term and long term weight goals.   Expected Outcomes Understanding recommendations for meals to include 15-35% energy as protein, 25-35% energy from fat, 35-60% energy from carbohydrates, less than 224m of dietary cholesterol, 20-35 gm of total fiber daily;Understanding of distribution of calorie intake throughout the day with the consumption of 4-5 meals/snacks;Short Term: Continue to assess and modify interventions until short term weight is achieved;Long Term: Adherence to nutrition and physical activity/exercise program aimed toward attainment of established weight goal   Sedentary Yes   Intervention Provide advice, education, support and counseling about physical activity/exercise needs.;Develop an individualized exercise prescription for aerobic and resistive training based on initial evaluation findings, risk stratification, comorbidities and participant's personal goals.   Expected Outcomes Achievement of increased cardiorespiratory fitness and enhanced flexibility, muscular endurance and strength shown through measurements of functional capacity and personal  statement of participant.   Increase Strength and Stamina Yes   Intervention Provide advice, education, support and counseling about physical activity/exercise needs.;Develop an individualized exercise prescription for aerobic and resistive training based on initial evaluation findings, risk stratification, comorbidities and participant's personal goals.   Expected Outcomes Achievement of increased cardiorespiratory fitness and enhanced flexibility, muscular endurance and strength shown through  measurements of functional capacity and personal statement of participant.   Improve shortness of breath with ADL's Yes   Intervention Provide education, individualized exercise plan and daily activity instruction to help decrease symptoms of SOB with activities of daily living.   Expected Outcomes Short Term: Achieves a reduction of symptoms when performing activities of daily living.   Develop more efficient breathing techniques such as purse lipped breathing and diaphragmatic breathing; and practicing self-pacing with activity Yes   Intervention Provide education, demonstration and support about specific breathing techniuqes utilized for more efficient breathing. Include techniques such as pursed lipped breathing, diaphragmatic breathing and self-pacing activity.   Expected Outcomes Short Term: Participant will be able to demonstrate and use breathing techniques as needed throughout daily activities.      Core Components/Risk Factors/Patient Goals Review:      Goals and Risk Factor Review    Row Name 03/26/16 1646             Core Components/Risk Factors/Patient Goals Review   Personal Goals Review Weight Management/Obesity;Sedentary;Increase Strength and Stamina;Improve shortness of breath with ADL's;Develop more efficient breathing techniques such as purse lipped breathing and diaphragmatic breathing and practicing self-pacing with activity.       Review see comments section on ITP        Expected Outcomes see admission expected outcomes          Core Components/Risk Factors/Patient Goals at Discharge (Final Review):      Goals and Risk Factor Review - 03/26/16 1646      Core Components/Risk Factors/Patient Goals Review   Personal Goals Review Weight Management/Obesity;Sedentary;Increase Strength and Stamina;Improve shortness of breath with ADL's;Develop more efficient breathing techniques such as purse lipped breathing and diaphragmatic breathing and practicing self-pacing with activity.   Review see comments section on ITP   Expected Outcomes see admission expected outcomes      ITP Comments:   Comments: ITP REVIEW Pt is not making expected progress toward pulmonary rehab goals after completing 8 sessions. Her weight and deconditioning and lack of attendance play a major part in her progress. She is over dependent on her husband. He brings her meals to her upstairs so she will not have to walk down the stairs. She is dependent on him for bathing and dressing. Recommend continued exercise, life style modification, education, and utilization of breathing techniques to increase stamina and strength and decrease shortness of breath with exertion.

## 2016-03-29 ENCOUNTER — Encounter (HOSPITAL_COMMUNITY): Admission: RE | Admit: 2016-03-29 | Payer: BLUE CROSS/BLUE SHIELD | Source: Ambulatory Visit

## 2016-04-03 ENCOUNTER — Telehealth (HOSPITAL_COMMUNITY): Payer: Self-pay | Admitting: Cardiovascular Disease

## 2016-04-03 ENCOUNTER — Encounter (HOSPITAL_COMMUNITY): Payer: BLUE CROSS/BLUE SHIELD

## 2016-04-05 ENCOUNTER — Encounter (HOSPITAL_COMMUNITY): Payer: BLUE CROSS/BLUE SHIELD

## 2016-04-10 ENCOUNTER — Encounter (HOSPITAL_COMMUNITY): Payer: BLUE CROSS/BLUE SHIELD

## 2016-04-12 ENCOUNTER — Encounter (HOSPITAL_COMMUNITY): Payer: BLUE CROSS/BLUE SHIELD

## 2016-04-17 ENCOUNTER — Encounter (HOSPITAL_COMMUNITY): Payer: BLUE CROSS/BLUE SHIELD

## 2016-04-19 ENCOUNTER — Encounter (HOSPITAL_COMMUNITY): Payer: BLUE CROSS/BLUE SHIELD

## 2016-04-24 ENCOUNTER — Encounter (HOSPITAL_COMMUNITY): Payer: BLUE CROSS/BLUE SHIELD

## 2016-04-26 ENCOUNTER — Encounter (HOSPITAL_COMMUNITY): Payer: BLUE CROSS/BLUE SHIELD

## 2016-05-01 ENCOUNTER — Encounter (HOSPITAL_COMMUNITY): Payer: BLUE CROSS/BLUE SHIELD

## 2016-05-03 ENCOUNTER — Encounter (HOSPITAL_COMMUNITY): Payer: BLUE CROSS/BLUE SHIELD

## 2016-05-03 ENCOUNTER — Encounter (HOSPITAL_COMMUNITY): Payer: Self-pay

## 2016-05-03 NOTE — Progress Notes (Signed)
Discharge Summary  Patient Details  Name: Nina Howell MRN: [id number] Date of Birth: [birth year] Referring Provider:   [facility] Walk Test from 02/23/2016 in [place]  Referring Provider  Dr. Haroldine Laws       Number of Visits: 4  Reason for Discharge:  Early Exit:  Personal. Patient states she can't commit to program enough to progress as expected. Patient was discharged per her request until her other medical issues are better controlled.  Smoking History:  History  Smoking Status  . Never Smoker  Smokeless Tobacco  . Never Used    Diagnosis:  No diagnosis found.  ADL UCSD:   Initial Exercise Prescription:     Initial Exercise Prescription - 02/23/16 1600      Date of Initial Exercise RX and Referring Provider   Date 02/23/16   Referring Provider Dr. Haroldine Laws     NuStep   Level 1   Minutes 17   METs 1.5     Arm Ergometer   Level 1   Minutes 17     Prescription Details   Frequency (times per week) 2   Duration Progress to 45 minutes of aerobic exercise without signs/symptoms of physical distress     Intensity   THRR 40-80% of Max Heartrate 58-117   Ratings of Perceived Exertion 11-13   Perceived Dyspnea 0-4     Progression   Progression Continue progressive overload as per policy without signs/symptoms or physical distress.     Resistance Training   Training Prescription Yes   Weight green bands   Reps 10-12      Discharge Exercise Prescription (Final Exercise Prescription Changes):     Exercise Prescription Changes - 03/15/16 1200      Response to Exercise   Blood Pressure (Admit) 120/70   Blood Pressure (Exercise) 106/66   Blood Pressure (Exit) 104/60   Heart Rate (Admit) 91 bpm   Heart Rate (Exercise) 93 bpm   Heart Rate (Exit) 84 bpm   Oxygen Saturation (Admit) 89 %   Oxygen Saturation (Exercise) 91 %   Oxygen Saturation (Exit) 94 %   Rating of Perceived Exertion (Exercise)  9   Perceived Dyspnea (Exercise) 0   Duration Progress to 45 minutes of aerobic exercise without signs/symptoms of physical distress   Intensity THRR unchanged     Progression   Progression Continue to progress workloads to maintain intensity without signs/symptoms of physical distress.     Resistance Training   Training Prescription Yes   Weight green bands   Reps 10-12  10 minutes of strength training     NuStep   Level 2   Minutes 34   METs 1.8      Functional Capacity:     6 Minute Walk    Row Name 02/23/16 1629         6 Minute Walk   Phase Initial     Distance 300 feet     Walk Time -  3 minutes 35 seconds     # of Rest Breaks 3  2 minutes and 25 seconds all together     MPH 0.56     RPE 17     Perceived Dyspnea  3     Symptoms Yes (comment)     Comments ankle pain     Resting HR 108 bpm     Resting BP 144/84     Max Ex. HR 148 bpm     Max Ex.  BP 140/80       Interval HR   Baseline HR 108     1 Minute HR 121     2 Minute HR 124     3 Minute HR 125     4 Minute HR 132     5 Minute HR 148     6 Minute HR 145     2 Minute Post HR 90     Interval Heart Rate? Yes       Interval Oxygen   Interval Oxygen? Yes     Baseline Oxygen Saturation % 95 %     Baseline Liters of Oxygen 0 L     1 Minute Oxygen Saturation % 95 %     1 Minute Liters of Oxygen 0 L     2 Minute Oxygen Saturation % 93 %     2 Minute Liters of Oxygen 0 L     3 Minute Oxygen Saturation % 95 %     3 Minute Liters of Oxygen 0 L     4 Minute Oxygen Saturation % 96 %     4 Minute Liters of Oxygen 0 L     5 Minute Oxygen Saturation % 96 %     5 Minute Liters of Oxygen 0 L     6 Minute Oxygen Saturation % 96 %     6 Minute Liters of Oxygen 0 L     2 Minute Post Oxygen Saturation % 96 %     2 Minute Post Liters of Oxygen 0 L        Psychological, QOL, Others - Outcomes: PHQ 2/9: Depression screen PHQ 2/9 02/20/2016  Decreased Interest 1  Down, Depressed, Hopeless 0  PHQ - 2  Score 1    Quality of Life:   Personal Goals: Goals established at orientation with interventions provided to work toward goal.     Personal Goals and Risk Factors at Admission - 03/26/16 1641      Core Components/Risk Factors/Patient Goals on Admission    Weight Management Obesity;Yes   Intervention Obesity: Provide education and appropriate resources to help participant work on and attain dietary goals.;Weight Management/Obesity: Establish reasonable short term and long term weight goals.   Expected Outcomes Understanding recommendations for meals to include 15-35% energy as protein, 25-35% energy from fat, 35-60% energy from carbohydrates, less than 200mg  of dietary cholesterol, 20-35 gm of total fiber daily;Understanding of distribution of calorie intake throughout the day with the consumption of 4-5 meals/snacks;Short Term: Continue to assess and modify interventions until short term weight is achieved;Long Term: Adherence to nutrition and physical activity/exercise program aimed toward attainment of established weight goal   Sedentary Yes   Intervention Provide advice, education, support and counseling about physical activity/exercise needs.;Develop an individualized exercise prescription for aerobic and resistive training based on initial evaluation findings, risk stratification, comorbidities and participant's personal goals.   Expected Outcomes Achievement of increased cardiorespiratory fitness and enhanced flexibility, muscular endurance and strength shown through measurements of functional capacity and personal statement of participant.   Increase Strength and Stamina Yes   Intervention Provide advice, education, support and counseling about physical activity/exercise needs.;Develop an individualized exercise prescription for aerobic and resistive training based on initial evaluation findings, risk stratification, comorbidities and participant's personal goals.   Expected Outcomes  Achievement of increased cardiorespiratory fitness and enhanced flexibility, muscular endurance and strength shown through measurements of functional capacity and personal statement of participant.   Improve shortness of breath  with ADL's Yes   Intervention Provide education, individualized exercise plan and daily activity instruction to help decrease symptoms of SOB with activities of daily living.   Expected Outcomes Short Term: Achieves a reduction of symptoms when performing activities of daily living.   Develop more efficient breathing techniques such as purse lipped breathing and diaphragmatic breathing; and practicing self-pacing with activity Yes   Intervention Provide education, demonstration and support about specific breathing techniuqes utilized for more efficient breathing. Include techniques such as pursed lipped breathing, diaphragmatic breathing and self-pacing activity.   Expected Outcomes Short Term: Participant will be able to demonstrate and use breathing techniques as needed throughout daily activities.       Personal Goals Discharge:     Goals and Risk Factor Review    Row Name 03/26/16 1646             Core Components/Risk Factors/Patient Goals Review   Personal Goals Review Weight Management/Obesity;Sedentary;Increase Strength and Stamina;Improve shortness of breath with ADL's;Develop more efficient breathing techniques such as purse lipped breathing and diaphragmatic breathing and practicing self-pacing with activity.       Review see comments section on ITP       Expected Outcomes see admission expected outcomes          Nutrition & Weight - Outcomes:     Pre Biometrics - 02/20/16 1059      Pre Biometrics   Grip Strength 27 kg       Nutrition:     Nutrition Therapy & Goals - 03/13/16 1216      Nutrition Therapy   Diet Carb Modified, Low Sodium     Personal Nutrition Goals   Personal Goal #1 1-2 lb wt loss/week to a wt loss goal of 6-24 lb at  graduation from [employer], educate and counsel regarding individualized specific dietary modifications aiming towards targeted core components such as weight, hypertension, lipid management, diabetes, heart failure and other comorbidities.   Expected Outcomes Short Term Goal: Understand basic principles of dietary content, such as calories, fat, sodium, cholesterol and nutrients.;Long Term Goal: Adherence to prescribed nutrition plan.      Nutrition Discharge:     Nutrition Assessments - 03/13/16 1216      Rate Your Plate Scores   Pre Score 64      Education Questionnaire Score:

## 2016-05-07 DIAGNOSIS — Z1389 Encounter for screening for other disorder: Secondary | ICD-10-CM | POA: Diagnosis not present

## 2016-05-07 DIAGNOSIS — E1142 Type 2 diabetes mellitus with diabetic polyneuropathy: Secondary | ICD-10-CM | POA: Diagnosis not present

## 2016-05-07 DIAGNOSIS — I5030 Unspecified diastolic (congestive) heart failure: Secondary | ICD-10-CM | POA: Diagnosis not present

## 2016-05-07 DIAGNOSIS — Z0000 Encounter for general adult medical examination without abnormal findings: Secondary | ICD-10-CM | POA: Diagnosis not present

## 2016-05-07 DIAGNOSIS — E039 Hypothyroidism, unspecified: Secondary | ICD-10-CM | POA: Diagnosis not present

## 2016-05-07 DIAGNOSIS — E1165 Type 2 diabetes mellitus with hyperglycemia: Secondary | ICD-10-CM | POA: Diagnosis not present

## 2016-05-08 ENCOUNTER — Encounter (HOSPITAL_COMMUNITY): Payer: BLUE CROSS/BLUE SHIELD

## 2016-05-10 ENCOUNTER — Encounter (HOSPITAL_COMMUNITY): Payer: BLUE CROSS/BLUE SHIELD

## 2016-05-15 ENCOUNTER — Encounter (HOSPITAL_COMMUNITY): Payer: BLUE CROSS/BLUE SHIELD

## 2016-05-15 ENCOUNTER — Encounter: Payer: Self-pay | Admitting: *Deleted

## 2016-05-17 ENCOUNTER — Encounter (HOSPITAL_COMMUNITY): Payer: BLUE CROSS/BLUE SHIELD

## 2016-05-22 ENCOUNTER — Encounter (HOSPITAL_COMMUNITY): Payer: BLUE CROSS/BLUE SHIELD

## 2016-05-24 ENCOUNTER — Encounter (HOSPITAL_COMMUNITY): Payer: BLUE CROSS/BLUE SHIELD

## 2016-05-25 ENCOUNTER — Telehealth: Payer: Self-pay | Admitting: Pulmonary Disease

## 2016-05-25 NOTE — Telephone Encounter (Signed)
Spoke with pt, who states she is having trouble with her cpap machine blowing out hot water, and running out of water. Pt states [name] stated it would take 24-48hr to contact pt. I have spoke with Corene Cornea with [facility] who states RT will reach out to pt now.  I have ATC pt to make her aware of this-no vm set up. Nothing further needed.

## 2016-05-28 ENCOUNTER — Telehealth: Payer: Self-pay | Admitting: Pulmonary Disease

## 2016-05-28 DIAGNOSIS — G4733 Obstructive sleep apnea (adult) (pediatric): Secondary | ICD-10-CM

## 2016-05-28 NOTE — Telephone Encounter (Signed)
Spoke with pt. States that she is having issues with her BiPAP machine. Reports that the BiPAP machine is blowing hot air. She states that she called [name] and they weren't much help. Pt would like for [place] to send an order to [facility] for a new BiPAP machine. Order has been placed. Nothing further was needed.

## 2016-05-28 NOTE — Telephone Encounter (Signed)
Encounter opened in error. Will sign off. 

## 2016-05-29 ENCOUNTER — Encounter (HOSPITAL_COMMUNITY): Payer: BLUE CROSS/BLUE SHIELD

## 2016-05-31 ENCOUNTER — Encounter (HOSPITAL_COMMUNITY): Payer: BLUE CROSS/BLUE SHIELD

## 2016-05-31 ENCOUNTER — Telehealth: Payer: Self-pay | Admitting: Pulmonary Disease

## 2016-05-31 MED ORDER — LEVOFLOXACIN 500 MG PO TABS: 500.0000 mg | ORAL_TABLET | Freq: Every day | ORAL | 0 refills | Status: DC

## 2016-05-31 NOTE — Telephone Encounter (Signed)
Pt states that she has started up Londin Antone phlegm about 2-days ago. Pt states that she feels like she chokes on the mucus when she coughs. Pt states that her BiPAP machine malfunctioned and that water in the humidifier was boiling at night and she was inhaling a lot of steam and was waking up in sweats at night and in the morning. Pt requesting a Rx for Levaquin 500mg . Please advise Dr Elsworth Soho. Thanks.

## 2016-05-31 NOTE — Progress Notes (Signed)
Patient ID: Nina Howell, female   DOB: 1940/12/17, 76 y.o.   MRN: [id number]      Primary Care: Dr. Baird Cancer Pulmonologist: Dr. Elsworth Soho Primary Cardiologist: Jeffie Pollock   HPI:  Nina Howell is a 76 y.o. female with history of diastolic CHF, OSA, DM, morbid obesity, COPD/asthma Seen by Dr Jeffie Pollock July 2017   Sees Dr Elsworth Soho for morbid obesity, OHS and OSA on CPAP has been encouraged to lose weight. Previously seen by Dr [name]. Echo 2011 done for dyspnea showed estimated PA pressure 56 mmHg and EF 45-50%. Underwent cath for abnormal ECG and nuclear study. Normal coronaries 2011. Follows with Dr. Johnsie Cancel now.   Admitted in 4/17 with respiratory failure and volume overload.. Overall diuresed 30 pounds. On ECHO RV severely dilated and LV EF 50-55%. With RVSP 69. Moderate pericardial effusion  VQ 08/25/15: Limited study due to body habitus. Possible 2 perfusion defects -> recommend chest CT CT 08/26/15: -> No PE Moderate to large pericardial effusion  She presents today for regular follow up.  Feeling much better. Breathing better. Weight stable. Able to do ADLs and go to store without too much problem. Taking torsemide 20 bid. ears CPAP routinely. Renal function stable. No CP. Had normal cath in 2011 going to pulmonary rehab now    Echo today shows LVEF 45%, RV dilated with septal bounce. Pericardial effusion resolved. Minimal TR. Pulmonary pressures < 40.  Past Medical History:  Diagnosis Date  . Acute pancreatitis   . Anemia   . Asymptomatic cholelithiasis   . Cellulitis    ABDOMINAL WALL  . CHF (congestive heart failure) (HCC)    DECOMPENSATED SYSTOLIC CHF  . CHF (congestive heart failure) ([place])   . CTS (carpal tunnel syndrome)   . Diabetes mellitus, type 2 ([place])   . DJD (degenerative joint disease)   . Hepatic lesion   . Hyperlipidemia   . 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 Hypertension   . Hypokalemia   . Hypothyroidism   . Hypoventilation   . IUD  (intrauterine device) in place   . Morbid obesity ([place])   . 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 Sleep apnea    cpap  . Varicose veins     Current Outpatient Prescriptions  Medication Sig Dispense Refill  . albuterol (PROVENTIL HFA;VENTOLIN HFA) 108 (90 Base) MCG/ACT inhaler Inhale 2 puffs into the lungs every 4 (four) hours as needed for wheezing or shortness of breath.    Marland Kitchen aspirin 81 MG tablet Take 81 mg by mouth daily.    Marland Kitchen atorvastatin (LIPITOR) 20 MG tablet Take 20 mg by mouth every other day.    . beclomethasone (QVAR) 80 MCG/ACT inhaler Inhale 2 puffs into the lungs 2 (two) times daily.    . Cholecalciferol (VITAMIN D3) 1000 UNITS CAPS Take 1 tablet by mouth daily.      Marland Kitchen CRANBERRY PO Take 4 tablets by mouth daily.     . Cyanocobalamin (VITAMIN B-12) 2500 MCG SUBL Place 1 tablet under the tongue daily.    Marland Kitchen doxycycline (VIBRA-TABS) 100 MG tablet Take 100 mg by mouth 2 (two) times daily.    . fluocinonide (LIDEX) 0.05 % external solution Apply 1 application topically 2 (two) times daily as needed (dermatosis).     . insulin NPH-regular Human (NOVOLIN 70/30) (70-30) 100 UNIT/ML injection Inject 30 Units into the skin 2 (two) times daily with a meal. 30 UNITS  IN AM, 25 UNITS IN PM    . levofloxacin (LEVAQUIN) 500 MG tablet Take 1 tablet (500 mg total) by mouth daily. 5 tablet 0  . levothyroxine (SYNTHROID, LEVOTHROID) 50 MCG tablet Take 50 mcg by mouth daily.      . Liraglutide (VICTOZA) 18 MG/3ML SOPN Inject 1.8 mg into the skin daily.    . metFORMIN (GLUCOPHAGE) 1000 MG tablet Take 1,000 mg by mouth 2 (two) times daily with a meal.    . Multiple Vitamin (MULTIVITAMIN WITH MINERALS) TABS tablet Take 0.5 tablets by mouth daily.    . niacinamide 500 MG tablet Take 500 mg by mouth 2 (two) times daily with a meal.    . polyethylene glycol (MIRALAX / GLYCOLAX) packet Take 17 g by mouth daily as needed. 14 each 0    . potassium chloride SA (K-DUR,KLOR-CON) 20 MEQ tablet Take 20 mEq by mouth every morning. Take an additional 20 meq tablet if take extra torsemide    . sertraline (ZOLOFT) 50 MG tablet Take 100 mg by mouth at bedtime.     Marland Kitchen spironolactone (ALDACTONE) 25 MG tablet Take 1 tablet (25 mg total) by mouth daily. 90 tablet 3  . torsemide (DEMADEX) 20 MG tablet TAKE 1 TABLET BY MOUTH TWO  TIMES DAILY 180 tablet 3   No current facility-administered medications for this visit.     Allergies  Allergen Reactions  . Celebrex [Celecoxib] Swelling  . Atorvastatin Other (See Comments)  . Cephalexin     REACTION: hives  . Cephalosporins     REACTION: rash  . Lasix [Furosemide] Hives  . Oxycodone-Acetaminophen Nausea Only  . Penicillins Other (See Comments)    Not known  . Oxycodone Anxiety    Felt weird      Social History   Social History  . Marital status: Married    Spouse name: N/A  . Number of children: 4  . Years of education: N/A   Occupational History  . retired Retired    Education officer, museum   Social History Main Topics  . Smoking status: Never Smoker  . Smokeless tobacco: Never Used  . Alcohol use No  . Drug use: No  . Sexual activity: Yes    Birth control/ protection: IUD   Other Topics Concern  . Not on file   Social History Narrative  . No narrative on file      Family History  Problem Relation Age of Onset  . Cancer Mother     throat  . Diabetes Father   . Leukemia Father     Vitals:   06/01/16 0800  BP: 130/85  Pulse: 99  SpO2: 90%  Weight: (!) 337 lb 6.4 oz (153 kg)  Height: 5' 1"  (1.549 m)     Wt Readings from Last 3 Encounters:  06/01/16 (!) 337 lb 6.4 oz (153 kg)  03/23/16 (!) 345 lb 9.6 oz (156.8 kg)  03/15/16 (!) 342 lb 2.5 oz (155.2 kg)     PHYSICAL EXAM: General:  Obese  entered clinic in [facility]. Husband present.  HEENT: normal Neck: supple. JVD difficult to see with girth appears about 7. Carotids 2+ bilat; no bruits. No thyromegaly or  nodule noted Cor: PMI nonpalpable. Regular rate & rhythm. No rubs, gallops or murmurs appreciated Lungs: clear Abdomen: obese soft, NT, ND, no HSM. No bruits or masses. +BS  Extremities: no cyanosis, clubbing, rash, edema Neuro: alert & oriented x 3, cranial nerves grossly intact. moves all 4 extremities w/o difficulty. Affect  pleasant.   ASSESSMENT & PLAN:  1. Chronic diastolic HF - Echo 07/23/41 LVEF 50-55%, Grade 1 DD, RV severely dilated and severely reduced. Echo today EF ~45% with septal bounce. RV dilated with moderate dysfunction. PA pressures < 40. Effsuion resolved - Down 30+ pounds in hospital during recent admission. Weight stable.  - Continue torsemide 20 mg BID.  Can take an additional 20 mg as needed for weight gain.  2. 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  - PA pressures much improved on echo after diuresis. We discussed R and LHC to clearly define pressures and coronary anatomy but she would like to hold off for now.  - VQ scan was indeterminate but Chest CT not suggestive of chronic PE, LE dopplers negative.  -  ANA 1:80 Positive, RF, P-ANCA negative,  ESR 35. Likely not clinically significant.  - Has pulmonary appointment scheduled for August.  - Recommend pulmonary rehab 3. Pericardial effusion, moderate to large - Now resolved with diuresis  4. Morbid obesity - She is aware that weight is a major contributing factor to her symptoms. Encouraged to lose weight and increase activity as able.  5. Abnormal ECG with inferior Q waves - No angina. Does not want to proceed with cath at this time. Can revisit as needed.  6. Obesity hypoventilation syndrome - Continued to counsel on weight loss.  7. OSA on CPAP - Continue CPAP  - No longer needing supplemental O2   Verlin Dike

## 2016-05-31 NOTE — Telephone Encounter (Signed)
Levaquin 500 milligrams daily for 5 days

## 2016-05-31 NOTE — Telephone Encounter (Signed)
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

## 2016-05-31 NOTE — Telephone Encounter (Signed)
Which other antibiotic is she on ?

## 2016-05-31 NOTE — Telephone Encounter (Signed)
Pt aware that Rx is being called into pharmacy >> verified pharmacy with patient.  Nothing further needed.

## 2016-06-01 ENCOUNTER — Encounter (INDEPENDENT_AMBULATORY_CARE_PROVIDER_SITE_OTHER): Payer: Self-pay

## 2016-06-01 ENCOUNTER — Ambulatory Visit (INDEPENDENT_AMBULATORY_CARE_PROVIDER_SITE_OTHER): Payer: BLUE CROSS/BLUE SHIELD | Admitting: Cardiovascular Disease

## 2016-06-01 ENCOUNTER — Encounter: Payer: Self-pay | Admitting: Cardiovascular Disease

## 2016-06-01 VITALS — BP 130/85 | HR 99 | Ht 61.0 in | Wt 337.4 lb

## 2016-06-01 DIAGNOSIS — I5032 Chronic diastolic (congestive) heart failure: Secondary | ICD-10-CM

## 2016-06-01 NOTE — Patient Instructions (Signed)
Medication Instructions:  Same-no changes  Labwork: None  Testing/Procedures: None  Follow-Up: Your physician wants you to follow-up in: 6 months. You will receive a reminder letter in the mail two months in advance. If you don't receive a letter, please call our office to schedule the follow-up appointment.      If you need a refill on your cardiac medications before your next appointment, please call your pharmacy.   

## 2016-06-04 ENCOUNTER — Telehealth: Payer: Self-pay | Admitting: Pulmonary Disease

## 2016-06-04 MED ORDER — ALBUTEROL SULFATE HFA 108 (90 BASE) MCG/ACT IN AERS: 2.0000 | INHALATION_SPRAY | RESPIRATORY_TRACT | 3 refills | Status: DC | PRN

## 2016-06-04 NOTE — Telephone Encounter (Signed)
Patient is returning phone call.  °

## 2016-06-04 NOTE — Telephone Encounter (Signed)
Lmtcb. I'm not seeing where RA has prescribed qvar, nor is it mentioned in last OV note. Ventolin has been sent to preferred pharmacy.

## 2016-06-04 NOTE — Telephone Encounter (Signed)
Spoke with pt, aware of ventolin rx being ready for pickup.  Pt states that she had been receiving qvar through urgent care at [facility], wishes for [place] to take over her rx for this.    RA ok to provide pt with qvar samples?  Pt takes Qvar 80- uses just prn per pt.

## 2016-06-04 NOTE — Telephone Encounter (Signed)
Patient called back.  I let her know that Ventolin has been sent to the pharmacy.  She states the Qvar was given to her by her previous PCP as a sample and it is out of date. CB is [phone].

## 2016-06-05 ENCOUNTER — Encounter (HOSPITAL_COMMUNITY): Payer: BLUE CROSS/BLUE SHIELD

## 2016-06-05 ENCOUNTER — Other Ambulatory Visit: Payer: Self-pay | Admitting: Cardiovascular Disease

## 2016-06-05 ENCOUNTER — Telehealth: Payer: Self-pay | Admitting: Pulmonary Disease

## 2016-06-05 MED ORDER — BECLOMETHASONE DIPROPIONATE 80 MCG/ACT IN AERS: 2.0000 | INHALATION_SPRAY | Freq: Two times a day (BID) | RESPIRATORY_TRACT | 0 refills | Status: DC

## 2016-06-05 NOTE — Telephone Encounter (Signed)
Spoke with pt. States that she is still having issues with mucus production. Reports cough and chest congestion. Cough is now producing clear, thick mucus. Pt states that she is choking on the mucus at times. Reports swallowing some of the mucus and this making her sick on her stomach. She has finished Levaquin 500mg  that was given to her on 05/31/16 by RA. Pt would like RA's recommendations.  RA - please advise. Thanks.

## 2016-06-05 NOTE — Telephone Encounter (Signed)
Spoke with pt. And gave her RA recc. Pt. Has agreed to his treatment option. She had no further questions. She is aware that she needs to call us if she is not better to get an appointment. Se had no further questions. Nothing further is needed at this time.

## 2016-06-05 NOTE — Telephone Encounter (Signed)
Okay to give her 1 sample and will address need during next office visit

## 2016-06-05 NOTE — Telephone Encounter (Signed)
Samples of Qvar have been placed up front for pick up. Pt aware and voiced her understanding. Nothing further needed.

## 2016-06-05 NOTE — Telephone Encounter (Signed)
mucinex 600 bid OV if no better with NP

## 2016-06-07 ENCOUNTER — Encounter (HOSPITAL_COMMUNITY): Payer: BLUE CROSS/BLUE SHIELD

## 2016-06-07 ENCOUNTER — Encounter (HOSPITAL_COMMUNITY): Payer: BLUE CROSS/BLUE SHIELD | Admitting: Internal Medicine

## 2016-06-12 ENCOUNTER — Encounter (HOSPITAL_COMMUNITY): Payer: BLUE CROSS/BLUE SHIELD

## 2016-06-19 ENCOUNTER — Telehealth: Payer: Self-pay | Admitting: Pulmonary Disease

## 2016-06-19 NOTE — Telephone Encounter (Signed)
She just completed Levaquin 2 weeks ago Will need to be seen for further recommendations or  if she feels worse Please arrange office visit with TP

## 2016-06-19 NOTE — Telephone Encounter (Signed)
Pt states home health nurse noted some L lung crackling upon exam.  Pt c/o prod cough with green mucus, stable sob with exertion.  Denies fever, chest pain/tightness, sinus congestion.   Requesting further recs.    Pt is taking mucinex and delsym to help with symptoms.    Pt uses [facility] on [address]    RA please advise.  Thanks!

## 2016-06-19 NOTE — Telephone Encounter (Signed)
Pt refused to schedule an appt - states that she is not coming out to get even sicker than she already is and she also has multiple appts already this month. Pt states that she will continue to take the Mucinex 600mg  BID. Will send to Dr Elsworth Soho to make aware pt refused appt.

## 2016-06-28 DIAGNOSIS — Z794 Long term (current) use of insulin: Secondary | ICD-10-CM | POA: Diagnosis not present

## 2016-06-28 DIAGNOSIS — Z6841 Body Mass Index (BMI) 40.0 and over, adult: Secondary | ICD-10-CM | POA: Diagnosis not present

## 2016-06-28 DIAGNOSIS — E1142 Type 2 diabetes mellitus with diabetic polyneuropathy: Secondary | ICD-10-CM | POA: Diagnosis not present

## 2016-06-28 DIAGNOSIS — Z5181 Encounter for therapeutic drug level monitoring: Secondary | ICD-10-CM | POA: Diagnosis not present

## 2016-06-28 DIAGNOSIS — E039 Hypothyroidism, unspecified: Secondary | ICD-10-CM | POA: Diagnosis not present

## 2016-07-05 DIAGNOSIS — Z79899 Other long term (current) drug therapy: Secondary | ICD-10-CM | POA: Diagnosis not present

## 2016-07-05 DIAGNOSIS — L120 Bullous pemphigoid: Secondary | ICD-10-CM | POA: Diagnosis not present

## 2016-07-05 DIAGNOSIS — L0889 Other specified local infections of the skin and subcutaneous tissue: Secondary | ICD-10-CM | POA: Diagnosis not present

## 2016-07-06 ENCOUNTER — Telehealth (HOSPITAL_COMMUNITY): Payer: Self-pay | Admitting: *Deleted

## 2016-07-06 DIAGNOSIS — I5032 Chronic diastolic (congestive) heart failure: Secondary | ICD-10-CM

## 2016-07-06 MED ORDER — TORSEMIDE 20 MG PO TABS: 40.0000 mg | ORAL_TABLET | Freq: Two times a day (BID) | ORAL | 3 refills | Status: DC

## 2016-07-06 NOTE — Telephone Encounter (Signed)
Nina Howell called to report pt is having a flare of her bullous pemphigoid and Dr Fontaine No wants to put pt on Prednisone 40 mg daily for 2 weeks but wanted Dr Bensimhon's ok.  Per Dr Haroldine Laws, pt ended up on ICU w/fluid overload last time on Prednisone, if pt needs it ok, but pt must double Torsemide to 40 mg BID, get bmet 1 week after and call for wt gain/edema.  I called and spoke w/pt, she is agreeable to increase Torsemide when she starts Prednisone, she states she weighs herself daily, advised her to call our office if she gains 3 lb overnight and 5 lb in a week.  bmet sch for Fri 2/23, pt is sch for f/u w/Dr Bensimhon on 2/28.  Pt aware, agreeable and verbalizes understanding to plan.  Attempted to call Nina Howell back and let her know our plan and Left message to call back

## 2016-07-09 NOTE — Telephone Encounter (Signed)
Kim called back regarding the plan, I called her back and left a detailed message but told her to call us back if she had any questions regarding the plan of care.

## 2016-07-10 NOTE — Telephone Encounter (Signed)
Attempted to call Nina Howell back again and left a detailed message on her VM about Dr Bensimhon's recommendations and to call us back if further questions or concerns

## 2016-07-10 NOTE — Telephone Encounter (Signed)
Pt called back this AM to say dermatology told her they have not spoken w/our office so they can not start her Prednisone yet.  Advised pt we have left them messages, as she has not started it yet, she has not increased Torsemide yet and will not need labs on Fri cancelled that appt, she will have labs done at [place] 2/28.    Attempted to call Maudie Mercury back at [phone] opt 2 and left another message for her to call us back.

## 2016-07-11 DIAGNOSIS — E1165 Type 2 diabetes mellitus with hyperglycemia: Secondary | ICD-10-CM | POA: Diagnosis not present

## 2016-07-11 DIAGNOSIS — G4733 Obstructive sleep apnea (adult) (pediatric): Secondary | ICD-10-CM | POA: Diagnosis not present

## 2016-07-11 DIAGNOSIS — J449 Chronic obstructive pulmonary disease, unspecified: Secondary | ICD-10-CM | POA: Diagnosis not present

## 2016-07-11 DIAGNOSIS — I509 Heart failure, unspecified: Secondary | ICD-10-CM | POA: Diagnosis not present

## 2016-07-12 NOTE — Telephone Encounter (Signed)
Spoke w/pt, she states she started the Prednisone yesterday.  She increased her Torsemide and will monitor her wt daily.

## 2016-07-13 ENCOUNTER — Other Ambulatory Visit (HOSPITAL_COMMUNITY): Payer: BLUE CROSS/BLUE SHIELD

## 2016-07-18 ENCOUNTER — Encounter (HOSPITAL_COMMUNITY): Payer: Self-pay | Admitting: Internal Medicine

## 2016-07-18 ENCOUNTER — Ambulatory Visit (HOSPITAL_COMMUNITY)
Admission: RE | Admit: 2016-07-18 | Discharge: 2016-07-18 | Disposition: A | Discharge status: Home / Self Care | Payer: BLUE CROSS/BLUE SHIELD | Source: Ambulatory Visit | Attending: Internal Medicine | Admitting: Internal Medicine

## 2016-07-18 VITALS — BP 136/84 | HR 76 | Wt 335.5 lb

## 2016-07-18 DIAGNOSIS — J449 Chronic obstructive pulmonary disease, unspecified: Secondary | ICD-10-CM | POA: Insufficient documentation

## 2016-07-18 DIAGNOSIS — Z7982 Long term (current) use of aspirin: Secondary | ICD-10-CM | POA: Insufficient documentation

## 2016-07-18 DIAGNOSIS — I110 Hypertensive heart disease with heart failure: Secondary | ICD-10-CM | POA: Insufficient documentation

## 2016-07-18 DIAGNOSIS — E785 Hyperlipidemia, unspecified: Secondary | ICD-10-CM | POA: Diagnosis not present

## 2016-07-18 DIAGNOSIS — Z833 Family history of diabetes mellitus: Secondary | ICD-10-CM | POA: Insufficient documentation

## 2016-07-18 DIAGNOSIS — Z6841 Body Mass Index (BMI) 40.0 and over, adult: Secondary | ICD-10-CM | POA: Insufficient documentation

## 2016-07-18 DIAGNOSIS — E119 Type 2 diabetes mellitus without complications: Secondary | ICD-10-CM | POA: Insufficient documentation

## 2016-07-18 DIAGNOSIS — I5032 Chronic diastolic (congestive) heart failure: Secondary | ICD-10-CM | POA: Diagnosis not present

## 2016-07-18 DIAGNOSIS — Z806 Family history of leukemia: Secondary | ICD-10-CM | POA: Diagnosis not present

## 2016-07-18 DIAGNOSIS — R9431 Abnormal electrocardiogram [ECG] [EKG]: Secondary | ICD-10-CM | POA: Insufficient documentation

## 2016-07-18 DIAGNOSIS — L120 Bullous pemphigoid: Secondary | ICD-10-CM | POA: Diagnosis not present

## 2016-07-18 DIAGNOSIS — Z808 Family history of malignant neoplasm of other organs or systems: Secondary | ICD-10-CM | POA: Insufficient documentation

## 2016-07-18 DIAGNOSIS — K761 Chronic passive congestion of liver: Secondary | ICD-10-CM | POA: Diagnosis not present

## 2016-07-18 DIAGNOSIS — Z881 Allergy status to other antibiotic agents status: Secondary | ICD-10-CM | POA: Diagnosis not present

## 2016-07-18 DIAGNOSIS — Z880 Allergy status to penicillin: Secondary | ICD-10-CM | POA: Diagnosis not present

## 2016-07-18 DIAGNOSIS — I272 Pulmonary hypertension, unspecified: Secondary | ICD-10-CM | POA: Insufficient documentation

## 2016-07-18 DIAGNOSIS — Z888 Allergy status to other drugs, medicaments and biological substances status: Secondary | ICD-10-CM | POA: Diagnosis not present

## 2016-07-18 DIAGNOSIS — Z7951 Long term (current) use of inhaled steroids: Secondary | ICD-10-CM | POA: Insufficient documentation

## 2016-07-18 DIAGNOSIS — Z885 Allergy status to narcotic agent status: Secondary | ICD-10-CM | POA: Insufficient documentation

## 2016-07-18 DIAGNOSIS — G4733 Obstructive sleep apnea (adult) (pediatric): Secondary | ICD-10-CM | POA: Diagnosis not present

## 2016-07-18 DIAGNOSIS — Z794 Long term (current) use of insulin: Secondary | ICD-10-CM | POA: Diagnosis not present

## 2016-07-18 DIAGNOSIS — E039 Hypothyroidism, unspecified: Secondary | ICD-10-CM | POA: Diagnosis not present

## 2016-07-18 DIAGNOSIS — Z79899 Other long term (current) drug therapy: Secondary | ICD-10-CM | POA: Insufficient documentation

## 2016-07-18 LAB — BASIC METABOLIC PANEL
ANION GAP: 12 (ref 5–15)
BUN: 19 mg/dL (ref 6–20)
CO2: 34 mmol/L — ABNORMAL HIGH (ref 22–32)
Calcium: 8.4 mg/dL — ABNORMAL LOW (ref 8.9–10.3)
Chloride: 94 mmol/L — ABNORMAL LOW (ref 101–111)
Creatinine, Ser: 0.74 mg/dL (ref 0.44–1.00)
GFR calc Af Amer: >60 mL/min (ref >60)
Glucose, Bld: 112 mg/dL — ABNORMAL HIGH (ref 65–99)
POTASSIUM: 3.3 mmol/L — AB (ref 3.5–5.1)
Sodium: 140 mmol/L (ref 135–145)

## 2016-07-18 NOTE — Progress Notes (Signed)
Patient ID: Nina Howell, female   DOB: 29-Nov-1940, 76 y.o.   MRN: [id number]    Advanced Heart Failure Clinic Note   Primary Care: Dr. Baird Cancer Pulmonologist: Dr. Elsworth Soho Primary Cardiologist: New (Dr. Haroldine Laws)  HPI:  Nina Howell is a 76 y.o. female with history of diastolic CHF, OSA, DM, morbid obesity, COPD/asthma presents to the ER because of increasing shortness of breath.  Sees Dr Elsworth Soho for morbid obesity, OHS and OSA on CPAP has been encouraged to lose weight. Previously seen by Dr [name]. Echo 2011 done for dyspnea showed estimated PA pressure 56 mmHg and EF 45-50%. Underwent cath for abnormal ECG and nuclear study. Normal coronaries 2011. Follows with Dr. Johnsie Cancel now.   Recently treated with prednisone and cellcept for bullous pemphigoid  Admitted in 4/17 with respiratory failure and volume overload.. Overall diuresed 30 pounds. On ECHO RV severely dilated and LV EF 50-55%. With RVSP 69. Moderate pericardial effusion  VQ 08/25/15: Limited study due to body habitus. Possible 2 perfusion defects -> recommend chest CT CT 08/26/15: -> No PE Moderate to large pericardial effusion  Echo 7/17 shows LVEF 45%, RV dilated with septal bounce. Mild RV HK Pericardial effusion resolved. Minimal TR. Pulmonary pressures < 40.   She presents today for regular follow up. Continues to do well from HF/PAH standpoint.  Struggling with pemphigoid. Was started back on prednisone las week so we increased torsemide to 20 bid to 40 bid and also continued spiro. Weigh has stayed the same. Breathing ok. Needs helps with ADLs and has an aide to help. No syncope or edema.     Past Medical History:  Diagnosis Date  . Acute pancreatitis   . Anemia   . Asymptomatic cholelithiasis   . Cellulitis    ABDOMINAL WALL  . CHF (congestive heart failure) (HCC)    DECOMPENSATED SYSTOLIC CHF  . CHF (congestive heart failure) ([place])   . CTS (carpal tunnel syndrome)   . Diabetes mellitus, type 2 ([place])   .  DJD (degenerative joint disease)   . Hepatic lesion   . Hyperlipidemia   . 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 Hypertension   . Hypokalemia   . Hypothyroidism   . Hypoventilation   . IUD (intrauterine device) in place   . Morbid obesity ([place])   . 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 Sleep apnea    cpap  . Varicose veins     Current Outpatient Prescriptions  Medication Sig Dispense Refill  . albuterol (PROVENTIL HFA;VENTOLIN HFA) 108 (90 Base) MCG/ACT inhaler Inhale 2 puffs into the lungs every 4 (four) hours as needed for wheezing or shortness of breath. 1 Inhaler 3  . aspirin 81 MG tablet Take 81 mg by mouth daily.    Marland Kitchen atorvastatin (LIPITOR) 20 MG tablet Take 20 mg by mouth every other day.    . beclomethasone (QVAR) 80 MCG/ACT inhaler Inhale 2 puffs into the lungs 2 (two) times daily.    . Cholecalciferol (VITAMIN D3) 1000 UNITS CAPS Take 1 tablet by mouth daily.      Marland Kitchen CRANBERRY PO Take 4 tablets by mouth daily.     . Cyanocobalamin (VITAMIN B-12) 2500 MCG SUBL Place 1 tablet under the tongue daily.    Marland Kitchen doxycycline (VIBRAMYCIN) 100 MG capsule Take 100 mg by mouth 2 (two) times daily.    . fluocinonide (LIDEX) 0.05 % external solution Apply 1 application topically  2 (two) times daily as needed (dermatosis).     . insulin NPH-regular Human (NOVOLIN 70/30) (70-30) 100 UNIT/ML injection Inject 30 Units into the skin 2 (two) times daily with a meal. 30 UNITS IN AM, 25 UNITS IN PM    . levothyroxine (SYNTHROID, LEVOTHROID) 50 MCG tablet Take 50 mcg by mouth daily.      . Liraglutide (VICTOZA) 18 MG/3ML SOPN Inject 1.8 mg into the skin daily.    . metFORMIN (GLUCOPHAGE) 1000 MG tablet Take 1,000 mg by mouth 2 (two) times daily with a meal.    . Multiple Vitamin (MULTIVITAMIN WITH MINERALS) TABS tablet Take 0.5 tablets by mouth daily.    . mupirocin ointment (BACTROBAN) 2 % Place 1 application  into the nose 2 (two) times daily.    . niacinamide 500 MG tablet Take 500 mg by mouth 2 (two) times daily with a meal.    . polyethylene glycol (MIRALAX / GLYCOLAX) packet Take 17 g by mouth daily as needed. 14 each 0  . potassium chloride SA (K-DUR,KLOR-CON) 20 MEQ tablet Take 20 mEq by mouth every morning. Take an additional 20 meq tablet if take extra torsemide    . predniSONE (DELTASONE) 10 MG tablet Take 10 mg by mouth daily with breakfast.    . sertraline (ZOLOFT) 50 MG tablet Take 100 mg by mouth at bedtime.     Marland Kitchen spironolactone (ALDACTONE) 25 MG tablet TAKE 1 TABLET BY MOUTH  DAILY 90 tablet 1  . torsemide (DEMADEX) 20 MG tablet Take 2 tablets (40 mg total) by mouth 2 (two) times daily. 180 tablet 3   No current facility-administered medications for this encounter.     Allergies  Allergen Reactions  . Celebrex [Celecoxib] Swelling  . Atorvastatin Other (See Comments)  . Cephalexin     REACTION: hives  . Cephalosporins     REACTION: rash  . Lasix [Furosemide] Hives  . Oxycodone-Acetaminophen Nausea Only  . Penicillins Other (See Comments)    Not known  . Oxycodone Anxiety    Felt weird      Social History   Social History  . Marital status: Married    Spouse name: N/A  . Number of children: 4  . Years of education: N/A   Occupational History  . retired Retired    Education officer, museum   Social History Main Topics  . Smoking status: Never Smoker  . Smokeless tobacco: Never Used  . Alcohol use No  . Drug use: No  . Sexual activity: Yes    Birth control/ protection: IUD   Other Topics Concern  . Not on file   Social History Narrative  . No narrative on file      Family History  Problem Relation Age of Onset  . Cancer Mother     throat  . Diabetes Father   . Leukemia Father     Vitals:   07/18/16 0911  BP: 136/84  Pulse: 76  SpO2: 98%  Weight: (!) 335 lb 8 oz (152.2 kg)     Wt Readings from Last 3 Encounters:  07/18/16 (!) 335 lb 8 oz (152.2 kg)    06/01/16 (!) 337 lb 6.4 oz (153 kg)  03/23/16 (!) 345 lb 9.6 oz (156.8 kg)     PHYSICAL EXAM: General:  Elderly appearing, entered clinic in [facility].  HEENT: normal Neck: supple. JVD difficult to see with girth appears ok. Carotids 2+ bilat; no bruits. No thyromegaly or nodule noted Cor: PMI nonpalpable. Regular rate &  rhythm. No rubs, gallops or murmurs appreciated Lungs: clear Abdomen: obese soft, NT, ND, no HSM. No bruits or masses. +BS  Extremities: no cyanosis, clubbing,  Edema. Bullous pemphigoid rash on back of left leg Neuro: alert & oriented x 3, cranial nerves grossly intact. moves all 4 extremities w/o difficulty. Affect pleasant.   ASSESSMENT & PLAN:  1. Chronic diastolic HF - Echo 10/24/04 LVEF 50-55%, Grade 1 DD, RV severely dilated and severely. Echo improved in 7/17 EF ~45% with septal bounce. RV dilated with mild to moderate dysfunction. PA pressures < 40. Effsuion resolved - Volume remains stable despite being back on prednisone. Continue torsemide 40 bid for now. When prednisone comes off go back to torsemide to 20 bid.  - Check labs today. - Repeat echo at next visit in 3-4 months. Can repeat RHC as needed 2. 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  - PA pressures much improved on echo after diuresis.  - We previously discussed R and LHC to clearly define pressures and coronary anatomy but she would like to hold off for now.  - VQ scan was indeterminate but Chest CT not suggestive of chronic PE, LE dopplers negative.  -  ANA 1:80 Positive, RF, P-ANCA negative,  ESR 35. Likely not clinically significant.  - Follows with Dr. Elsworth Soho 3. Pericardial effusion, moderate to large - Now resolved with diuresis  4. Morbid obesity - She is aware that weight is a major contributing factor to her symptoms. Encouraged to lose weight and increase activity as able.  5. Abnormal ECG with inferior Q waves - No  angina. Does not want to proceed with cath at this time. Can revisit as needed.  6. Obesity hypoventilation syndrome - Continued to counsel on weight loss.  7. OSA on CPAP - Continue CPAP  - No longer needing supplemental O2 8. Bullous pemphigoid - Followed by Dermatology  Bensimhon, Daniel,MD 9:24 AM

## 2016-07-18 NOTE — Addendum Note (Signed)
Encounter addended by: Scarlette Calico, RN on: 07/18/2016  9:49 AM<BR>    Actions taken: Order list changed, Diagnosis association updated, Sign clinical note

## 2016-07-18 NOTE — Patient Instructions (Signed)
Lab today  Your physician recommends that you schedule a follow-up appointment in: 3-4 months with echocardiogram  

## 2016-07-19 ENCOUNTER — Telehealth (HOSPITAL_COMMUNITY): Payer: Self-pay | Admitting: *Deleted

## 2016-07-19 DIAGNOSIS — I5022 Chronic systolic (congestive) heart failure: Secondary | ICD-10-CM

## 2016-07-19 MED ORDER — POTASSIUM CHLORIDE CRYS ER 20 MEQ PO TBCR: 40.0000 meq | EXTENDED_RELEASE_TABLET | ORAL | 3 refills | Status: DC

## 2016-07-19 NOTE — Telephone Encounter (Signed)
Notes Recorded by Harvie Junior, CMA on 07/19/2016 at 2:46 PM EST Patient aware of lab results and medication change. Added to lab schedule 3/16 ------  Notes Recorded by Jolaine Artist, MD on 07/18/2016 at 10:05 PM EST Please increase kcl by 57meq daily. Have her take 60 meq extra the first day. Repeat 2-3 weeks. thanks    Ref Range & Units 1d ago 52mo ago 35mo ago   Sodium 135 - 145 mmol/L 140  137  135    Potassium 3.5 - 5.1 mmol/L 3.3   3.9  3.9    Chloride 101 - 111 mmol/L 94   96   94     CO2 22 - 32 mmol/L 34   30  28    Glucose, Bld 65 - 99 mg/dL 112   153   113     BUN 6 - 20 mg/dL 19  14  14     Creatinine, Ser 0.44 - 1.00 mg/dL 0.74  0.84  0.68    Calcium 8.9 - 10.3 mg/dL 8.4   9.7  9.0    GFR calc non Af Amer >60 mL/min >60  >60  >60    GFR calc Af Amer >60 mL/min >60  >60CM  >60CM

## 2016-07-27 ENCOUNTER — Telehealth (HOSPITAL_COMMUNITY): Payer: Self-pay | Admitting: *Deleted

## 2016-07-27 NOTE — Telephone Encounter (Signed)
Patient called in complaining of cramping all night in her legs and hands. I asked about her potassium and she had only been taking 20 mEq in the morning but she was suppose to be taking 40 mEq.  I advised her per DB that she needed to take an extra 20 mEq now to complete her morning dose as prescribed and she can take an extra one with her torsemide dose this evening.  I advised her to call us back if her cramps continue.  She also asked if I could change her lab appointment next Friday to Thursday the 15th.  Patient is agreeable with plan and appointment changed as requested.

## 2016-08-01 ENCOUNTER — Telehealth: Payer: Self-pay | Admitting: Pulmonary Disease

## 2016-08-01 NOTE — Telephone Encounter (Signed)
Message will be routed to Nina Howell for follow up 

## 2016-08-02 ENCOUNTER — Ambulatory Visit (HOSPITAL_COMMUNITY)
Admission: RE | Admit: 2016-08-02 | Discharge: 2016-08-02 | Disposition: A | Discharge status: Home / Self Care | Payer: BLUE CROSS/BLUE SHIELD | Source: Ambulatory Visit | Attending: Internal Medicine | Admitting: Internal Medicine

## 2016-08-02 DIAGNOSIS — I5022 Chronic systolic (congestive) heart failure: Secondary | ICD-10-CM | POA: Diagnosis not present

## 2016-08-02 DIAGNOSIS — Z79899 Other long term (current) drug therapy: Secondary | ICD-10-CM | POA: Diagnosis not present

## 2016-08-02 DIAGNOSIS — L120 Bullous pemphigoid: Secondary | ICD-10-CM | POA: Diagnosis not present

## 2016-08-02 LAB — BASIC METABOLIC PANEL
ANION GAP: 16 — AB (ref 5–15)
BUN: 20 mg/dL (ref 6–20)
CO2: 30 mmol/L (ref 22–32)
Calcium: 9.1 mg/dL (ref 8.9–10.3)
Chloride: 90 mmol/L — ABNORMAL LOW (ref 101–111)
Creatinine, Ser: 0.87 mg/dL (ref 0.44–1.00)
GFR calc Af Amer: >60 mL/min (ref >60)
GLUCOSE: 259 mg/dL — AB (ref 65–99)
POTASSIUM: 3.6 mmol/L (ref 3.5–5.1)
Sodium: 136 mmol/L (ref 135–145)

## 2016-08-02 NOTE — Telephone Encounter (Signed)
Filled out forms, will place in RA's look-at folder for him to sign when he returns to the office next Monday.

## 2016-08-03 ENCOUNTER — Other Ambulatory Visit (HOSPITAL_COMMUNITY): Payer: BLUE CROSS/BLUE SHIELD

## 2016-08-06 NOTE — Telephone Encounter (Signed)
Paperwork has been faxed. Spoke with pt's husband Gwynn Burly to tell him the papers have been filled out and faxed. Will keep papers for one week to make sure [employer] has received them.   Nothing else needed at time of call.

## 2016-09-19 ENCOUNTER — Other Ambulatory Visit (HOSPITAL_COMMUNITY): Payer: Self-pay | Admitting: Cardiology

## 2016-09-19 MED ORDER — TORSEMIDE 20 MG PO TABS: 40.0000 mg | ORAL_TABLET | Freq: Two times a day (BID) | ORAL | 3 refills | Status: DC

## 2016-09-26 ENCOUNTER — Other Ambulatory Visit (HOSPITAL_COMMUNITY): Payer: Self-pay | Admitting: Cardiology

## 2016-09-26 DIAGNOSIS — L97921 Non-pressure chronic ulcer of unspecified part of left lower leg limited to breakdown of skin: Secondary | ICD-10-CM | POA: Diagnosis not present

## 2016-09-26 DIAGNOSIS — L120 Bullous pemphigoid: Secondary | ICD-10-CM | POA: Diagnosis not present

## 2016-09-26 DIAGNOSIS — Z79899 Other long term (current) drug therapy: Secondary | ICD-10-CM | POA: Diagnosis not present

## 2016-09-26 NOTE — Telephone Encounter (Signed)
Opened in error

## 2016-10-09 ENCOUNTER — Encounter (HOSPITAL_BASED_OUTPATIENT_CLINIC_OR_DEPARTMENT_OTHER): Payer: BLUE CROSS/BLUE SHIELD | Attending: Surgery

## 2016-10-09 DIAGNOSIS — Z7952 Long term (current) use of systemic steroids: Secondary | ICD-10-CM | POA: Insufficient documentation

## 2016-10-09 DIAGNOSIS — E119 Type 2 diabetes mellitus without complications: Secondary | ICD-10-CM | POA: Insufficient documentation

## 2016-10-09 DIAGNOSIS — Z7982 Long term (current) use of aspirin: Secondary | ICD-10-CM | POA: Diagnosis not present

## 2016-10-09 DIAGNOSIS — Z794 Long term (current) use of insulin: Secondary | ICD-10-CM | POA: Diagnosis not present

## 2016-10-09 DIAGNOSIS — Z6841 Body Mass Index (BMI) 40.0 and over, adult: Secondary | ICD-10-CM | POA: Insufficient documentation

## 2016-10-09 DIAGNOSIS — Z79899 Other long term (current) drug therapy: Secondary | ICD-10-CM | POA: Diagnosis not present

## 2016-10-09 DIAGNOSIS — L120 Bullous pemphigoid: Secondary | ICD-10-CM | POA: Insufficient documentation

## 2016-10-09 DIAGNOSIS — L97122 Non-pressure chronic ulcer of left thigh with fat layer exposed: Secondary | ICD-10-CM | POA: Diagnosis not present

## 2016-10-09 DIAGNOSIS — G4730 Sleep apnea, unspecified: Secondary | ICD-10-CM | POA: Diagnosis not present

## 2016-10-09 DIAGNOSIS — L97112 Non-pressure chronic ulcer of right thigh with fat layer exposed: Secondary | ICD-10-CM | POA: Diagnosis not present

## 2016-10-19 ENCOUNTER — Ambulatory Visit (HOSPITAL_COMMUNITY)
Admission: RE | Admit: 2016-10-19 | Discharge: 2016-10-19 | Disposition: A | Discharge status: Home / Self Care | Payer: BLUE CROSS/BLUE SHIELD | Source: Ambulatory Visit | Attending: Internal Medicine | Admitting: Internal Medicine

## 2016-10-19 ENCOUNTER — Ambulatory Visit (HOSPITAL_BASED_OUTPATIENT_CLINIC_OR_DEPARTMENT_OTHER)
Admission: RE | Admit: 2016-10-19 | Discharge: 2016-10-19 | Disposition: A | Discharge status: Home / Self Care | Payer: BLUE CROSS/BLUE SHIELD | Source: Ambulatory Visit | Attending: Internal Medicine | Admitting: Internal Medicine

## 2016-10-19 VITALS — BP 128/98 | HR 94 | Wt 337.0 lb

## 2016-10-19 DIAGNOSIS — I5032 Chronic diastolic (congestive) heart failure: Secondary | ICD-10-CM | POA: Insufficient documentation

## 2016-10-19 DIAGNOSIS — I272 Pulmonary hypertension, unspecified: Secondary | ICD-10-CM

## 2016-10-19 DIAGNOSIS — E039 Hypothyroidism, unspecified: Secondary | ICD-10-CM | POA: Insufficient documentation

## 2016-10-19 DIAGNOSIS — R9431 Abnormal electrocardiogram [ECG] [EKG]: Secondary | ICD-10-CM | POA: Diagnosis not present

## 2016-10-19 DIAGNOSIS — E785 Hyperlipidemia, unspecified: Secondary | ICD-10-CM | POA: Insufficient documentation

## 2016-10-19 DIAGNOSIS — I110 Hypertensive heart disease with heart failure: Secondary | ICD-10-CM | POA: Insufficient documentation

## 2016-10-19 DIAGNOSIS — Z7982 Long term (current) use of aspirin: Secondary | ICD-10-CM | POA: Diagnosis not present

## 2016-10-19 DIAGNOSIS — I313 Pericardial effusion (noninflammatory): Secondary | ICD-10-CM | POA: Insufficient documentation

## 2016-10-19 DIAGNOSIS — Z794 Long term (current) use of insulin: Secondary | ICD-10-CM | POA: Insufficient documentation

## 2016-10-19 DIAGNOSIS — G5600 Carpal tunnel syndrome, unspecified upper limb: Secondary | ICD-10-CM | POA: Diagnosis not present

## 2016-10-19 DIAGNOSIS — G4733 Obstructive sleep apnea (adult) (pediatric): Secondary | ICD-10-CM | POA: Diagnosis not present

## 2016-10-19 DIAGNOSIS — I3139 Other pericardial effusion (noninflammatory): Secondary | ICD-10-CM

## 2016-10-19 DIAGNOSIS — Z975 Presence of (intrauterine) contraceptive device: Secondary | ICD-10-CM | POA: Diagnosis not present

## 2016-10-19 DIAGNOSIS — E119 Type 2 diabetes mellitus without complications: Secondary | ICD-10-CM | POA: Diagnosis not present

## 2016-10-19 DIAGNOSIS — J449 Chronic obstructive pulmonary disease, unspecified: Secondary | ICD-10-CM | POA: Insufficient documentation

## 2016-10-19 DIAGNOSIS — M1990 Unspecified osteoarthritis, unspecified site: Secondary | ICD-10-CM | POA: Insufficient documentation

## 2016-10-19 NOTE — Addendum Note (Signed)
Encounter addended by: Jolaine Artist, MD on: 10/19/2016 10:58 AM<BR>    Actions taken: Sign clinical note

## 2016-10-19 NOTE — Progress Notes (Addendum)
Patient ID: Nina Howell, female   DOB: December 08, 1940, 76 y.o.   MRN: [id number]    Advanced Heart Failure Clinic Note   Primary Care: Dr. Baird Cancer Pulmonologist: Dr. Elsworth Soho Primary Cardiologist: New (Dr. Haroldine Laws)  HPI:  Nina Howell is a 76 y.o. female with history of diastolic CHF, OSA, DM, morbid obesity, COPD/asthma presents to the ER because of increasing shortness of breath.  Sees Dr Elsworth Soho for morbid obesity, OHS and OSA on CPAP has been encouraged to lose weight. Previously seen by Dr [name]. Echo 2011 done for dyspnea showed estimated PA pressure 56 mmHg and EF 45-50%. Underwent cath for abnormal ECG and nuclear study. Normal coronaries 2011. Follows with Dr. Johnsie Cancel now.   Recently treated with prednisone and cellcept for bullous pemphigoid  Admitted in 4/17 with respiratory failure and volume overload.. Overall diuresed 30 pounds. On ECHO RV severely dilated and LV EF 50-55%. With RVSP 69. Moderate pericardial effusion  VQ 08/25/15: Limited study due to body habitus. Possible 2 perfusion defects -> recommend chest CT CT 08/26/15: -> No PE Moderate to large pericardial effusion  Echo 7/17 shows LVEF 45%, RV dilated with septal bounce. Mild RV HK Pericardial effusion resolved. Minimal TR. Pulmonary pressures < 40.  She presents today for HF follow up. Overall feeling pretty good. Gets around with her cane. Still fairly limited. Still has some open wounds on her right this and left hip. Currently weaning prednisone. Mild edema.  Weight is down 10 pounds. She is religious about wearing her CPAP.  Takes torsemide 40 bid. No syncope or presyncope.   Echo today Reviewed personally. Very poor images  LVEF 55-60% RV dilated. Moderate HK. No TR to measure RVSP  No effusion     Past Medical History:  Diagnosis Date  . Acute pancreatitis   . Anemia   . Asymptomatic cholelithiasis   . Cellulitis    ABDOMINAL WALL  . CHF (congestive heart failure) (HCC)    DECOMPENSATED  SYSTOLIC CHF  . CHF (congestive heart failure) ([place])   . CTS (carpal tunnel syndrome)   . Diabetes mellitus, type 2 ([place])   . DJD (degenerative joint disease)   . Hepatic lesion   . Hyperlipidemia   . 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 Hypertension   . Hypokalemia   . Hypothyroidism   . Hypoventilation   . IUD (intrauterine device) in place   . Morbid obesity ([place])   . 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 Sleep apnea    cpap  . Varicose veins     Current Outpatient Prescriptions  Medication Sig Dispense Refill  . albuterol (PROVENTIL HFA;VENTOLIN HFA) 108 (90 Base) MCG/ACT inhaler Inhale 2 puffs into the lungs every 4 (four) hours as needed for wheezing or shortness of breath. 1 Inhaler 3  . aspirin 81 MG tablet Take 81 mg by mouth daily.    Marland Kitchen atorvastatin (LIPITOR) 20 MG tablet Take 20 mg by mouth every other day.    . beclomethasone (QVAR) 80 MCG/ACT inhaler Inhale 2 puffs into the lungs 2 (two) times daily.    . Cholecalciferol (VITAMIN D3) 1000 UNITS CAPS Take 1 tablet by mouth daily.      Marland Kitchen CRANBERRY PO Take 4 tablets by mouth daily.     . Cyanocobalamin (VITAMIN B-12) 2500 MCG SUBL Place 1 tablet under the tongue daily.    Marland Kitchen doxycycline (VIBRAMYCIN) 100 MG capsule Take 100  mg by mouth 2 (two) times daily.    . fluocinonide (LIDEX) 0.05 % external solution Apply 1 application topically 2 (two) times daily as needed (dermatosis).     . insulin NPH-regular Human (NOVOLIN 70/30) (70-30) 100 UNIT/ML injection Inject 30 Units into the skin 2 (two) times daily with a meal. 30 UNITS IN AM, 25 UNITS IN PM    . levothyroxine (SYNTHROID, LEVOTHROID) 50 MCG tablet Take 50 mcg by mouth daily.      . Liraglutide (VICTOZA) 18 MG/3ML SOPN Inject 1.8 mg into the skin daily.    . metFORMIN (GLUCOPHAGE) 1000 MG tablet Take 1,000 mg by mouth 2 (two) times daily with a meal.    . Multiple Vitamin  (MULTIVITAMIN WITH MINERALS) TABS tablet Take 0.5 tablets by mouth daily.    . mupirocin ointment (BACTROBAN) 2 % Place 1 application into the nose 2 (two) times daily.    . niacinamide 500 MG tablet Take 500 mg by mouth 2 (two) times daily with a meal.    . polyethylene glycol (MIRALAX / GLYCOLAX) packet Take 17 g by mouth daily as needed. 14 each 0  . potassium chloride SA (K-DUR,KLOR-CON) 20 MEQ tablet Take 2 tablets (40 mEq total) by mouth every morning. Take an additional 20 meq tablet if you take extra torsemide 180 tablet 3  . predniSONE (DELTASONE) 10 MG tablet Take 10 mg by mouth daily with breakfast.    . sertraline (ZOLOFT) 50 MG tablet Take 100 mg by mouth at bedtime.     Marland Kitchen spironolactone (ALDACTONE) 25 MG tablet TAKE 1 TABLET BY MOUTH  DAILY 90 tablet 1  . torsemide (DEMADEX) 20 MG tablet Take 2 tablets (40 mg total) by mouth 2 (two) times daily. 360 tablet 3   No current facility-administered medications for this encounter.     Allergies  Allergen Reactions  . Celebrex [Celecoxib] Swelling  . Atorvastatin Other (See Comments)  . Cephalexin     REACTION: hives  . Cephalosporins     REACTION: rash  . Lasix [Furosemide] Hives  . Oxycodone-Acetaminophen Nausea Only  . Penicillins Other (See Comments)    Not known  . Oxycodone Anxiety    Felt weird      Social History   Social History  . Marital status: Married    Spouse name: N/A  . Number of children: 4  . Years of education: N/A   Occupational History  . retired Retired    Education officer, museum   Social History Main Topics  . Smoking status: Never Smoker  . Smokeless tobacco: Never Used  . Alcohol use No  . Drug use: No  . Sexual activity: Yes    Birth control/ protection: IUD   Other Topics Concern  . Not on file   Social History Narrative  . No narrative on file      Family History  Problem Relation Age of Onset  . Cancer Mother        throat  . Diabetes Father   . Leukemia Father     Vitals:    10/19/16 1035  BP: (!) 128/98  Pulse: 94  SpO2: 94%  Weight: (!) 337 lb (152.9 kg)     Wt Readings from Last 3 Encounters:  07/18/16 (!) 335 lb 8 oz (152.2 kg)  06/01/16 (!) 337 lb 6.4 oz (153 kg)  03/23/16 (!) 345 lb 9.6 oz (156.8 kg)     PHYSICAL EXAM: General: Obese female, NAD. Arrived to clinic in wheelchair, but  can stand for weight. HEENT: Normal. Anicteric  Neck: supple. JVP 7 Carotids 2+ bilat; no bruits. No thyromegaly or nodule noted Cor: PMI nonpalpable.Regular rate and rhythm. No m/r/g  Lungs: Clear bilaterally.  Abdomen: markedly obese soft, NT/ND good BS Extremities: no cyanosis, clubbing. No edema Neuro: alert & oriented x 3, cranial nerves grossly intact. moves all 4 extremities w/o difficulty. Affect pleasant   ASSESSMENT & PLAN:  1. Chronic diastolic HF - Echo 10/26/71 LVEF 50-55%, Grade 1 DD, RV severely dilated and severely. Echo improved in 7/17 EF ~45% with septal bounce. RV dilated with mild to moderate dysfunction. PA pressures < 40. Effsuion resolved. Echo today EF 55-60% - Volume status looks good. Continue torsemide 40 bid for now. When prednisone comes off completely man need to go back to torsemide to 20 bid.  - Check labs with Dr. Buddy Duty. Next week. 2. 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  - PA pressures much improved on previous echo after diuresis. Unable to measure RVSP today on echo.  - We discussed R +/- LHC to clearly define pressures. But wants to defer for now  - VQ scan was indeterminate but Chest CT not suggestive of chronic PE, LE dopplers negative.  -  ANA 1:80 Positive, RF, P-ANCA negative,  ESR 35. Likely not clinically significant.  - Follows with Dr. Elsworth Soho 3. Pericardial effusion, moderate to large - resolved with diuresis 4. Morbid obesity - She is aware that weight is a major contributing factor to her symptoms. Has lost 10 pounds in 6 months. Encouraged  her to continue this.  5. Abnormal ECG with inferior Q waves - No angina. Does not want to proceed with cath at this time. Can revisit as needed.  6. Obesity hypoventilation syndrome - Continued to counsel on weight loss.  7. OSA on CPAP - Continue CPAP  - No longer needing supplemental O2 8. Bullous pemphigoid - Followed by Dermatology  Glori Bickers, MD  10:56 AM

## 2016-10-19 NOTE — Addendum Note (Signed)
Encounter addended by: Scarlette Calico, RN on: 10/19/2016 10:59 AM<BR>    Actions taken: Sign clinical note

## 2016-10-19 NOTE — Patient Instructions (Signed)
We will contact you in 6 months to schedule your next appointment.  

## 2016-10-24 DIAGNOSIS — Z5181 Encounter for therapeutic drug level monitoring: Secondary | ICD-10-CM | POA: Diagnosis not present

## 2016-10-24 DIAGNOSIS — E039 Hypothyroidism, unspecified: Secondary | ICD-10-CM | POA: Diagnosis not present

## 2016-10-24 DIAGNOSIS — Z6841 Body Mass Index (BMI) 40.0 and over, adult: Secondary | ICD-10-CM | POA: Diagnosis not present

## 2016-10-24 DIAGNOSIS — Z794 Long term (current) use of insulin: Secondary | ICD-10-CM | POA: Diagnosis not present

## 2016-10-24 DIAGNOSIS — E1165 Type 2 diabetes mellitus with hyperglycemia: Secondary | ICD-10-CM | POA: Diagnosis not present

## 2016-10-24 DIAGNOSIS — E1142 Type 2 diabetes mellitus with diabetic polyneuropathy: Secondary | ICD-10-CM | POA: Diagnosis not present

## 2016-11-07 DIAGNOSIS — L120 Bullous pemphigoid: Secondary | ICD-10-CM | POA: Diagnosis not present

## 2016-11-07 DIAGNOSIS — Z7984 Long term (current) use of oral hypoglycemic drugs: Secondary | ICD-10-CM | POA: Diagnosis not present

## 2016-11-07 DIAGNOSIS — Z794 Long term (current) use of insulin: Secondary | ICD-10-CM | POA: Diagnosis not present

## 2016-11-07 DIAGNOSIS — E1165 Type 2 diabetes mellitus with hyperglycemia: Secondary | ICD-10-CM | POA: Diagnosis not present

## 2016-11-07 DIAGNOSIS — I509 Heart failure, unspecified: Secondary | ICD-10-CM | POA: Diagnosis not present

## 2016-11-07 DIAGNOSIS — K7460 Unspecified cirrhosis of liver: Secondary | ICD-10-CM | POA: Diagnosis not present

## 2016-11-07 DIAGNOSIS — I10 Essential (primary) hypertension: Secondary | ICD-10-CM | POA: Diagnosis not present

## 2016-11-14 DIAGNOSIS — Z79899 Other long term (current) drug therapy: Secondary | ICD-10-CM | POA: Diagnosis not present

## 2016-11-14 DIAGNOSIS — L120 Bullous pemphigoid: Secondary | ICD-10-CM | POA: Diagnosis not present

## 2016-11-19 DIAGNOSIS — R300 Dysuria: Secondary | ICD-10-CM | POA: Diagnosis not present

## 2016-11-27 ENCOUNTER — Telehealth (HOSPITAL_COMMUNITY): Payer: Self-pay | Admitting: *Deleted

## 2016-11-27 NOTE — Telephone Encounter (Signed)
Received fax from Jesse Sans, RN with [place] regarding patient's wt gain of 7.5 since June 30th.  I called patient directly and she stated she felt fine but she had been eating watermelon and she stated she will try and do better with her diet.  Her weight this morning is down to 334.5 from 337.5 yesterday.    I spoke with Oda Kilts, PA and he advises patient to take an extra 20 of torsemide this morning to help get some extra fluid off.  I called patient back and she said she will wait and see what her weight is in the morning and would like to see if she can get it off herself with diet changes. No further questions at this time.

## 2016-12-05 ENCOUNTER — Other Ambulatory Visit: Payer: Self-pay | Admitting: Cardiovascular Disease

## 2016-12-05 DIAGNOSIS — E039 Hypothyroidism, unspecified: Secondary | ICD-10-CM | POA: Diagnosis not present

## 2016-12-05 DIAGNOSIS — Z6841 Body Mass Index (BMI) 40.0 and over, adult: Secondary | ICD-10-CM | POA: Diagnosis not present

## 2016-12-05 DIAGNOSIS — Z5181 Encounter for therapeutic drug level monitoring: Secondary | ICD-10-CM | POA: Diagnosis not present

## 2016-12-05 DIAGNOSIS — Z79899 Other long term (current) drug therapy: Secondary | ICD-10-CM | POA: Diagnosis not present

## 2016-12-05 DIAGNOSIS — E1142 Type 2 diabetes mellitus with diabetic polyneuropathy: Secondary | ICD-10-CM | POA: Diagnosis not present

## 2016-12-05 DIAGNOSIS — Z794 Long term (current) use of insulin: Secondary | ICD-10-CM | POA: Diagnosis not present

## 2016-12-05 DIAGNOSIS — E1165 Type 2 diabetes mellitus with hyperglycemia: Secondary | ICD-10-CM | POA: Diagnosis not present

## 2016-12-13 DIAGNOSIS — Z7984 Long term (current) use of oral hypoglycemic drugs: Secondary | ICD-10-CM | POA: Diagnosis not present

## 2016-12-13 DIAGNOSIS — E1142 Type 2 diabetes mellitus with diabetic polyneuropathy: Secondary | ICD-10-CM | POA: Diagnosis not present

## 2016-12-13 DIAGNOSIS — N390 Urinary tract infection, site not specified: Secondary | ICD-10-CM | POA: Diagnosis not present

## 2016-12-13 DIAGNOSIS — Z794 Long term (current) use of insulin: Secondary | ICD-10-CM | POA: Diagnosis not present

## 2017-01-07 DIAGNOSIS — Z1231 Encounter for screening mammogram for malignant neoplasm of breast: Secondary | ICD-10-CM | POA: Diagnosis not present

## 2017-01-09 DIAGNOSIS — Z6841 Body Mass Index (BMI) 40.0 and over, adult: Secondary | ICD-10-CM | POA: Diagnosis not present

## 2017-01-09 DIAGNOSIS — Z30433 Encounter for removal and reinsertion of intrauterine contraceptive device: Secondary | ICD-10-CM | POA: Diagnosis not present

## 2017-01-09 DIAGNOSIS — N8502 Endometrial intraepithelial neoplasia [EIN]: Secondary | ICD-10-CM | POA: Diagnosis not present

## 2017-01-24 DIAGNOSIS — I509 Heart failure, unspecified: Secondary | ICD-10-CM | POA: Diagnosis not present

## 2017-01-24 DIAGNOSIS — G4733 Obstructive sleep apnea (adult) (pediatric): Secondary | ICD-10-CM | POA: Diagnosis not present

## 2017-01-24 DIAGNOSIS — J449 Chronic obstructive pulmonary disease, unspecified: Secondary | ICD-10-CM | POA: Diagnosis not present

## 2017-01-24 DIAGNOSIS — E1165 Type 2 diabetes mellitus with hyperglycemia: Secondary | ICD-10-CM | POA: Diagnosis not present

## 2017-01-30 ENCOUNTER — Other Ambulatory Visit (HOSPITAL_COMMUNITY): Payer: Self-pay | Admitting: Internal Medicine

## 2017-02-06 DIAGNOSIS — L120 Bullous pemphigoid: Secondary | ICD-10-CM | POA: Diagnosis not present

## 2017-02-27 DIAGNOSIS — R39198 Other difficulties with micturition: Secondary | ICD-10-CM | POA: Diagnosis not present

## 2017-03-11 DIAGNOSIS — E119 Type 2 diabetes mellitus without complications: Secondary | ICD-10-CM | POA: Diagnosis not present

## 2017-03-11 DIAGNOSIS — H2513 Age-related nuclear cataract, bilateral: Secondary | ICD-10-CM | POA: Diagnosis not present

## 2017-03-11 DIAGNOSIS — Z794 Long term (current) use of insulin: Secondary | ICD-10-CM | POA: Diagnosis not present

## 2017-03-17 ENCOUNTER — Other Ambulatory Visit (HOSPITAL_COMMUNITY): Payer: Self-pay | Admitting: Internal Medicine

## 2017-03-18 DIAGNOSIS — E039 Hypothyroidism, unspecified: Secondary | ICD-10-CM | POA: Diagnosis not present

## 2017-03-18 DIAGNOSIS — Z23 Encounter for immunization: Secondary | ICD-10-CM | POA: Diagnosis not present

## 2017-03-18 DIAGNOSIS — Z794 Long term (current) use of insulin: Secondary | ICD-10-CM | POA: Diagnosis not present

## 2017-03-18 DIAGNOSIS — E1142 Type 2 diabetes mellitus with diabetic polyneuropathy: Secondary | ICD-10-CM | POA: Diagnosis not present

## 2017-03-18 DIAGNOSIS — E1165 Type 2 diabetes mellitus with hyperglycemia: Secondary | ICD-10-CM | POA: Diagnosis not present

## 2017-03-19 ENCOUNTER — Other Ambulatory Visit (HOSPITAL_COMMUNITY): Payer: Self-pay | Admitting: *Deleted

## 2017-03-19 MED ORDER — TORSEMIDE 20 MG PO TABS: 40.0000 mg | ORAL_TABLET | Freq: Two times a day (BID) | ORAL | 3 refills | Status: DC

## 2017-03-21 ENCOUNTER — Other Ambulatory Visit (HOSPITAL_COMMUNITY): Payer: Self-pay | Admitting: *Deleted

## 2017-03-21 MED ORDER — TORSEMIDE 20 MG PO TABS: 40.0000 mg | ORAL_TABLET | Freq: Two times a day (BID) | ORAL | 0 refills | Status: DC

## 2017-03-26 ENCOUNTER — Telehealth: Payer: Self-pay | Admitting: Pulmonary Disease

## 2017-03-26 NOTE — Telephone Encounter (Signed)
Called and spoke with pt and she is aware of RA recs.  Nothing further is needed.

## 2017-03-26 NOTE — Telephone Encounter (Signed)
Spoke with pt, who states she had high dose flu shot on 03/18/17. Pt states after flu vaccine she developed non prod cough & wheezing. Denies fever, chills or sweats.  Pt states she is using proair q4h with mild improvement Pt has upcoming apt with RA on 04-08-17  RA please advise. Thanks.

## 2017-03-26 NOTE — Telephone Encounter (Signed)
Ct pro-air as needed Delsym 31ml twice daily prn cough

## 2017-03-28 ENCOUNTER — Other Ambulatory Visit (HOSPITAL_COMMUNITY): Payer: Self-pay | Admitting: *Deleted

## 2017-04-01 ENCOUNTER — Telehealth: Payer: Self-pay | Admitting: Pulmonary Disease

## 2017-04-01 NOTE — Telephone Encounter (Signed)
Spoke with pt, I advised her that we do not have samples of QVAR (I thought we had some). Pt understood and nothing further is needed.

## 2017-04-01 NOTE — Telephone Encounter (Signed)
Spoke with pt, she would like a a sample for Qvar 80. I see that she has an appointment on 04/01/2017 with RA. Can we give her a sample of Qvar? RA please advise.

## 2017-04-01 NOTE — Telephone Encounter (Signed)
Okay to give sample

## 2017-04-03 ENCOUNTER — Observation Stay (HOSPITAL_COMMUNITY)
Admission: AD | Admit: 2017-04-03 | Discharge: 2017-04-05 | DRG: 202 | Disposition: A | Discharge status: Home Health | Payer: BLUE CROSS/BLUE SHIELD | Source: Ambulatory Visit | Attending: Pulmonary Disease | Admitting: Pulmonary Disease

## 2017-04-03 ENCOUNTER — Ambulatory Visit (INDEPENDENT_AMBULATORY_CARE_PROVIDER_SITE_OTHER): Payer: BLUE CROSS/BLUE SHIELD | Admitting: Pulmonary Disease

## 2017-04-03 ENCOUNTER — Encounter: Payer: Self-pay | Admitting: Internal Medicine

## 2017-04-03 ENCOUNTER — Other Ambulatory Visit: Payer: Self-pay

## 2017-04-03 ENCOUNTER — Inpatient Hospital Stay (HOSPITAL_COMMUNITY): Payer: BLUE CROSS/BLUE SHIELD

## 2017-04-03 ENCOUNTER — Encounter (HOSPITAL_COMMUNITY): Payer: Self-pay

## 2017-04-03 ENCOUNTER — Telehealth: Payer: Self-pay | Admitting: Pulmonary Disease

## 2017-04-03 DIAGNOSIS — Z6841 Body Mass Index (BMI) 40.0 and over, adult: Secondary | ICD-10-CM

## 2017-04-03 DIAGNOSIS — I5022 Chronic systolic (congestive) heart failure: Secondary | ICD-10-CM | POA: Diagnosis not present

## 2017-04-03 DIAGNOSIS — J9601 Acute respiratory failure with hypoxia: Secondary | ICD-10-CM

## 2017-04-03 DIAGNOSIS — Z792 Long term (current) use of antibiotics: Secondary | ICD-10-CM

## 2017-04-03 DIAGNOSIS — I313 Pericardial effusion (noninflammatory): Secondary | ICD-10-CM | POA: Diagnosis present

## 2017-04-03 DIAGNOSIS — Z79890 Hormone replacement therapy (postmenopausal): Secondary | ICD-10-CM

## 2017-04-03 DIAGNOSIS — Z885 Allergy status to narcotic agent status: Secondary | ICD-10-CM

## 2017-04-03 DIAGNOSIS — E119 Type 2 diabetes mellitus without complications: Secondary | ICD-10-CM | POA: Diagnosis present

## 2017-04-03 DIAGNOSIS — E662 Morbid (severe) obesity with alveolar hypoventilation: Secondary | ICD-10-CM | POA: Diagnosis present

## 2017-04-03 DIAGNOSIS — K7460 Unspecified cirrhosis of liver: Secondary | ICD-10-CM | POA: Diagnosis present

## 2017-04-03 DIAGNOSIS — J9600 Acute respiratory failure, unspecified whether with hypoxia or hypercapnia: Secondary | ICD-10-CM

## 2017-04-03 DIAGNOSIS — Z888 Allergy status to other drugs, medicaments and biological substances status: Secondary | ICD-10-CM

## 2017-04-03 DIAGNOSIS — E785 Hyperlipidemia, unspecified: Secondary | ICD-10-CM | POA: Diagnosis not present

## 2017-04-03 DIAGNOSIS — Z833 Family history of diabetes mellitus: Secondary | ICD-10-CM | POA: Diagnosis not present

## 2017-04-03 DIAGNOSIS — E039 Hypothyroidism, unspecified: Secondary | ICD-10-CM | POA: Diagnosis present

## 2017-04-03 DIAGNOSIS — Z881 Allergy status to other antibiotic agents status: Secondary | ICD-10-CM | POA: Diagnosis not present

## 2017-04-03 DIAGNOSIS — Z794 Long term (current) use of insulin: Secondary | ICD-10-CM

## 2017-04-03 DIAGNOSIS — R0602 Shortness of breath: Secondary | ICD-10-CM | POA: Diagnosis not present

## 2017-04-03 DIAGNOSIS — Z880 Allergy status to penicillin: Secondary | ICD-10-CM | POA: Diagnosis not present

## 2017-04-03 DIAGNOSIS — L120 Bullous pemphigoid: Secondary | ICD-10-CM | POA: Diagnosis not present

## 2017-04-03 DIAGNOSIS — J9611 Chronic respiratory failure with hypoxia: Secondary | ICD-10-CM | POA: Diagnosis present

## 2017-04-03 DIAGNOSIS — Z79899 Other long term (current) drug therapy: Secondary | ICD-10-CM

## 2017-04-03 DIAGNOSIS — Z7982 Long term (current) use of aspirin: Secondary | ICD-10-CM | POA: Diagnosis not present

## 2017-04-03 DIAGNOSIS — Z7951 Long term (current) use of inhaled steroids: Secondary | ICD-10-CM

## 2017-04-03 DIAGNOSIS — Z806 Family history of leukemia: Secondary | ICD-10-CM

## 2017-04-03 DIAGNOSIS — R05 Cough: Secondary | ICD-10-CM | POA: Diagnosis not present

## 2017-04-03 DIAGNOSIS — J209 Acute bronchitis, unspecified: Principal | ICD-10-CM | POA: Diagnosis present

## 2017-04-03 DIAGNOSIS — I2721 Secondary pulmonary arterial hypertension: Secondary | ICD-10-CM | POA: Diagnosis present

## 2017-04-03 DIAGNOSIS — I110 Hypertensive heart disease with heart failure: Secondary | ICD-10-CM | POA: Diagnosis not present

## 2017-04-03 DIAGNOSIS — G4733 Obstructive sleep apnea (adult) (pediatric): Secondary | ICD-10-CM

## 2017-04-03 LAB — COMPREHENSIVE METABOLIC PANEL
ALT: 11 U/L — ABNORMAL LOW (ref 14–54)
AST: 17 U/L (ref 15–41)
Albumin: 3.4 g/dL — ABNORMAL LOW (ref 3.5–5.0)
Alkaline Phosphatase: 102 U/L (ref 38–126)
Anion gap: 15 (ref 5–15)
BUN: 16 mg/dL (ref 6–20)
CO2: 31 mmol/L (ref 22–32)
Calcium: 8.8 mg/dL — ABNORMAL LOW (ref 8.9–10.3)
Chloride: 90 mmol/L — ABNORMAL LOW (ref 101–111)
Creatinine, Ser: 1.01 mg/dL — ABNORMAL HIGH (ref 0.44–1.00)
GFR calc Af Amer: >60 mL/min (ref >60)
GFR, EST NON AFRICAN AMERICAN: 53 mL/min — AB (ref >60)
Glucose, Bld: 312 mg/dL — ABNORMAL HIGH (ref 65–99)
POTASSIUM: 3.5 mmol/L (ref 3.5–5.1)
Sodium: 136 mmol/L (ref 135–145)
Total Bilirubin: 0.7 mg/dL (ref 0.3–1.2)
Total Protein: 8 g/dL (ref 6.5–8.1)

## 2017-04-03 LAB — URINALYSIS, ROUTINE W REFLEX MICROSCOPIC
BILIRUBIN URINE: NEGATIVE
Glucose, UA: NEGATIVE mg/dL
Hgb urine dipstick: NEGATIVE
KETONES UR: NEGATIVE mg/dL
Leukocytes, UA: NEGATIVE
NITRITE: NEGATIVE
Protein, ur: NEGATIVE mg/dL
Specific Gravity, Urine: 1.006 (ref 1.005–1.030)
pH: 6 (ref 5.0–8.0)

## 2017-04-03 LAB — CBC WITH DIFFERENTIAL/PLATELET
BASOS PCT: 0 %
Basophils Absolute: 0 10*3/uL (ref 0.0–0.1)
EOS ABS: 0.1 10*3/uL (ref 0.0–0.7)
EOS PCT: 1 %
HCT: 38.1 % (ref 36.0–46.0)
Hemoglobin: 12.5 g/dL (ref 12.0–15.0)
Lymphocytes Relative: 13 %
Lymphs Abs: 1.8 10*3/uL (ref 0.7–4.0)
MCH: 29.8 pg (ref 26.0–34.0)
MCHC: 32.8 g/dL (ref 30.0–36.0)
MCV: 90.7 fL (ref 78.0–100.0)
MONO ABS: 0.7 10*3/uL (ref 0.1–1.0)
MONOS PCT: 5 %
NEUTROS PCT: 81 %
Neutro Abs: 11.3 10*3/uL — ABNORMAL HIGH (ref 1.7–7.7)
PLATELETS: 365 10*3/uL (ref 150–400)
RBC: 4.2 MIL/uL (ref 3.87–5.11)
RDW: 15.6 % — AB (ref 11.5–15.5)
WBC: 13.9 10*3/uL — ABNORMAL HIGH (ref 4.0–10.5)

## 2017-04-03 LAB — MAGNESIUM: MAGNESIUM: 0.9 mg/dL — AB (ref 1.7–2.4)

## 2017-04-03 LAB — BRAIN NATRIURETIC PEPTIDE: B Natriuretic Peptide: 61.7 pg/mL (ref 0.0–100.0)

## 2017-04-03 LAB — TROPONIN I: Troponin I: <0.03 ng/mL (ref <0.03)

## 2017-04-03 LAB — PHOSPHORUS: PHOSPHORUS: 4 mg/dL (ref 2.5–4.6)

## 2017-04-03 LAB — GLUCOSE, CAPILLARY
GLUCOSE-CAPILLARY: 133 mg/dL — AB (ref 65–99)
GLUCOSE-CAPILLARY: 300 mg/dL — AB (ref 65–99)
Glucose-Capillary: 158 mg/dL — ABNORMAL HIGH (ref 65–99)
Glucose-Capillary: 158 mg/dL — ABNORMAL HIGH (ref 65–99)

## 2017-04-03 LAB — D-DIMER, QUANTITATIVE (NOT AT ARMC): D DIMER QUANT: 0.87 ug{FEU}/mL — AB (ref 0.00–0.50)

## 2017-04-03 MED ORDER — LEVOTHYROXINE SODIUM 50 MCG PO TABS
50.0000 ug | ORAL_TABLET | Freq: Every day | ORAL | Status: DC
  Administered 2017-04-04 – 2017-04-05 (×2): 50 ug via ORAL
  Filled 2017-04-03 (×2): qty 1

## 2017-04-03 MED ORDER — INSULIN ASPART PROT & ASPART (70-30 MIX) 100 UNIT/ML ~~LOC~~ SUSP
25.0000 [IU] | Freq: Every day | SUBCUTANEOUS | Status: DC
  Administered 2017-04-03 – 2017-04-04 (×2): 25 [IU] via SUBCUTANEOUS
  Filled 2017-04-03: qty 10

## 2017-04-03 MED ORDER — VITAMIN B-12 1000 MCG PO TABS
2500.0000 ug | ORAL_TABLET | Freq: Every day | ORAL | Status: DC
  Administered 2017-04-04 – 2017-04-05 (×2): 2500 ug via ORAL
  Filled 2017-04-03 (×2): qty 3

## 2017-04-03 MED ORDER — SODIUM CHLORIDE 0.9 % IV SOLN: 250.0000 mL | INTRAVENOUS | Status: DC | PRN

## 2017-04-03 MED ORDER — LEVOFLOXACIN IN D5W 500 MG/100ML IV SOLN
500.0000 mg | INTRAVENOUS | Status: DC
  Administered 2017-04-03 – 2017-04-04 (×2): 500 mg via INTRAVENOUS
  Filled 2017-04-03 (×2): qty 100

## 2017-04-03 MED ORDER — METHYLPREDNISOLONE SODIUM SUCC 40 MG IJ SOLR
40.0000 mg | Freq: Every day | INTRAMUSCULAR | Status: DC
  Filled 2017-04-03: qty 1

## 2017-04-03 MED ORDER — LIRAGLUTIDE 18 MG/3ML ~~LOC~~ SOPN: 1.8000 mg | PEN_INJECTOR | Freq: Every day | SUBCUTANEOUS | Status: DC

## 2017-04-03 MED ORDER — ACETAMINOPHEN 325 MG PO TABS: 650.0000 mg | ORAL_TABLET | ORAL | Status: DC | PRN

## 2017-04-03 MED ORDER — IPRATROPIUM-ALBUTEROL 0.5-2.5 (3) MG/3ML IN SOLN
3.0000 mL | Freq: Three times a day (TID) | RESPIRATORY_TRACT | Status: DC
  Administered 2017-04-04 – 2017-04-05 (×4): 3 mL via RESPIRATORY_TRACT
  Filled 2017-04-03 (×5): qty 3

## 2017-04-03 MED ORDER — INSULIN ASPART PROT & ASPART (70-30 MIX) 100 UNIT/ML ~~LOC~~ SUSP: 25.0000 [IU] | Freq: Two times a day (BID) | SUBCUTANEOUS | Status: DC

## 2017-04-03 MED ORDER — IPRATROPIUM-ALBUTEROL 0.5-2.5 (3) MG/3ML IN SOLN
3.0000 mL | Freq: Four times a day (QID) | RESPIRATORY_TRACT | Status: DC
  Administered 2017-04-03 (×2): 3 mL via RESPIRATORY_TRACT
  Filled 2017-04-03 (×2): qty 3

## 2017-04-03 MED ORDER — ENOXAPARIN SODIUM 40 MG/0.4ML ~~LOC~~ SOLN
40.0000 mg | SUBCUTANEOUS | Status: DC
  Administered 2017-04-03 – 2017-04-04 (×2): 40 mg via SUBCUTANEOUS
  Filled 2017-04-03 (×2): qty 0.4

## 2017-04-03 MED ORDER — TORSEMIDE 20 MG PO TABS
40.0000 mg | ORAL_TABLET | Freq: Two times a day (BID) | ORAL | Status: DC
  Administered 2017-04-03 – 2017-04-05 (×4): 40 mg via ORAL
  Filled 2017-04-03 (×4): qty 2

## 2017-04-03 MED ORDER — INSULIN ASPART PROT & ASPART (70-30 MIX) 100 UNIT/ML ~~LOC~~ SUSP
42.0000 [IU] | Freq: Every day | SUBCUTANEOUS | Status: DC
  Administered 2017-04-04 – 2017-04-05 (×2): 42 [IU] via SUBCUTANEOUS

## 2017-04-03 MED ORDER — DEXTROMETHORPHAN POLISTIREX ER 30 MG/5ML PO SUER
15.0000 mg | Freq: Two times a day (BID) | ORAL | Status: DC
  Administered 2017-04-03 – 2017-04-05 (×4): 15 mg via ORAL
  Filled 2017-04-03 (×4): qty 5

## 2017-04-03 MED ORDER — SERTRALINE HCL 100 MG PO TABS
100.0000 mg | ORAL_TABLET | Freq: Every day | ORAL | Status: DC
  Administered 2017-04-03 – 2017-04-04 (×2): 100 mg via ORAL
  Filled 2017-04-03 (×2): qty 1

## 2017-04-03 MED ORDER — ASPIRIN EC 81 MG PO TBEC
81.0000 mg | DELAYED_RELEASE_TABLET | Freq: Every day | ORAL | Status: DC
  Administered 2017-04-03 – 2017-04-05 (×3): 81 mg via ORAL
  Filled 2017-04-03 (×3): qty 1

## 2017-04-03 MED ORDER — ATORVASTATIN CALCIUM 20 MG PO TABS
20.0000 mg | ORAL_TABLET | ORAL | Status: DC
  Administered 2017-04-04: 20 mg via ORAL
  Filled 2017-04-03: qty 1

## 2017-04-03 MED ORDER — GUAIFENESIN 100 MG/5ML PO SOLN
5.0000 mL | Freq: Once | ORAL | Status: AC
  Administered 2017-04-03: 100 mg via ORAL
  Filled 2017-04-03: qty 10

## 2017-04-03 MED ORDER — INSULIN ASPART 100 UNIT/ML ~~LOC~~ SOLN
0.0000 [IU] | SUBCUTANEOUS | Status: DC
  Administered 2017-04-03 (×2): 4 [IU] via SUBCUTANEOUS
  Administered 2017-04-03: 11 [IU] via SUBCUTANEOUS
  Administered 2017-04-04: 3 [IU] via SUBCUTANEOUS
  Administered 2017-04-04: 7 [IU] via SUBCUTANEOUS
  Administered 2017-04-04: 3 [IU] via SUBCUTANEOUS
  Administered 2017-04-04 – 2017-04-05 (×3): 4 [IU] via SUBCUTANEOUS
  Administered 2017-04-05: 11 [IU] via SUBCUTANEOUS

## 2017-04-03 MED ORDER — MAGNESIUM SULFATE 50 % IJ SOLN
6.0000 g | Freq: Once | INTRAVENOUS | Status: AC
  Administered 2017-04-03: 6 g via INTRAVENOUS
  Filled 2017-04-03: qty 10

## 2017-04-03 NOTE — Patient Instructions (Signed)
Admit to telemetry

## 2017-04-03 NOTE — H&P (Signed)
Name: Nina Howell MRN: [id number] DOB: 07/15/1940    ADMISSION DATE:  04/03/2017 HISTORY OF PRESENT ILLNESS:    76/F Never smoker morbidly obese for FU of obstructive sleep apnea/ OHS .  Shealso hasbullous pemphigoid On prednisone Transiently  Last seen 03/2016. She has been calling the office for cough, was initially offered symptomatic treatment and offered an office visit last week but could not make it. Accompanied by her daughter and grand daughter today, was found to be hypoxic on arrival and placed on oxygen.  Complains of shortness of breath and productive cough with clear mucus for 1 week  She was also diagnosed with Chronic systolic heart failure -EF of 35%. Pericardial effusion was noted which seemed to resolve on follow-up echo, last done 10/2016 she has follow-up with the advanced heart failure program  She has transiently required oxygen in the past      Significant tests/ events  08/19/2015 admission for acute respiratory failure with hypoxia and hypercarbia thought to be multifactorial combination of COPD/asthma/CHF ( likely 2/2 increased pulmonary hypertension in context of obesity and COPD ).   08/2015:2-D echo shows severely dilated RV with severely reduced RV systolic function(new-not seen on prior Echo) PA pressure of 69 ANA 1: 160 speckled, other autoimmune workup negative VQ scan and CT angiogram did not show pulmonary embolus. 11/2015 repeat echo >>. Pressures decreased   PSG 12/2011showed moderate obstructive sleep apnea with AHi 15/h.       PAST MEDICAL HISTORY :   has a past medical history of Acute pancreatitis, Anemia, Asymptomatic cholelithiasis, Bullous pemphigoid, Cellulitis, CHF (congestive heart failure) ([place]), CHF (congestive heart failure) ([place]), CTS (carpal tunnel syndrome), Diabetes mellitus, type 2 ([place]), DJD (degenerative joint disease), Hepatic lesion, Hyperlipidemia, Hyperplasia of endometrium determined by  biopsy, Hypertension, Hypokalemia, Hypothyroidism, Hypoventilation, IUD (intrauterine device) in place, Liver cirrhosis ([place]), Morbid obesity ([place]), Rash and nonspecific skin eruption, Sleep apnea, and Varicose veins.  has a past surgical history that includes Shoulder surgery (2009); Cardiac catheterization (2011); Tubal ligation; Dilation and curettage of uterus; COLONOSCOPY WITH PROPOFOL (N/A, 12/24/2014); and DILATATION & CURETTAGE/HYSTEROSCOPY WITH RESECTOCOPE (N/A, 06/18/2011). Prior to Admission medications   Medication Sig Start Date End Date Taking? Authorizing Provider  albuterol (PROVENTIL HFA;VENTOLIN HFA) 108 (90 Base) MCG/ACT inhaler Inhale 2 puffs into the lungs every 4 (four) hours as needed for wheezing or shortness of breath. 06/04/16   Rigoberto Noel, MD  aspirin 81 MG tablet Take 81 mg by mouth daily.    [provider]  atorvastatin (LIPITOR) 20 MG tablet Take 20 mg by mouth every other day.    [provider]  beclomethasone (QVAR) 80 MCG/ACT inhaler Inhale 2 puffs into the lungs 2 (two) times daily.    [provider]  Cholecalciferol (VITAMIN D3) 1000 UNITS CAPS Take 2 tablets daily by mouth.     [provider]  CRANBERRY PO Take 4 tablets by mouth daily.     [provider]  Cyanocobalamin (VITAMIN B-12) 2500 MCG SUBL Place 1 tablet under the tongue daily.    [provider]  doxycycline (VIBRAMYCIN) 100 MG capsule Take 100 mg by mouth 2 (two) times daily.    [provider]  fluocinonide (LIDEX) 0.05 % external solution Apply 1 application topically 2 (two) times daily as needed (dermatosis).     [provider]  insulin NPH-regular Human (NOVOLIN 70/30) (70-30) 100 UNIT/ML injection Inject 30 Units 2 (two) times daily with a meal into the skin. 42 UNITS  IN AM, 25 UNITS IN PM    [provider]  levothyroxine (SYNTHROID, LEVOTHROID) 50 MCG tablet Take 50 mcg by mouth daily.      [provider]  lidocaine (XYLOCAINE) 5 % ointment Apply 1 application topically 2 (two) times daily as needed.    [provider]  Liraglutide (VICTOZA) 18 MG/3ML SOPN Inject 1.8 mg into the skin daily.    [provider]  metFORMIN (GLUCOPHAGE) 1000 MG tablet Take 1,000 mg by mouth 2 (two) times daily with a meal.    [provider]  Multiple Vitamin (MULTIVITAMIN WITH MINERALS) TABS tablet Take 0.5 tablets by mouth daily.    [provider]  mupirocin ointment (BACTROBAN) 2 % Place 1 application into the nose 2 (two) times daily.    [provider]  niacinamide 500 MG tablet Take 500 mg by mouth 2 (two) times daily with a meal.    [provider]  polyethylene glycol (MIRALAX / GLYCOLAX) packet Take 17 g by mouth daily as needed. 08/27/15   Ghimire, Henreitta Leber, MD  potassium chloride SA (K-DUR,KLOR-CON) 20 MEQ tablet TAKE 2 TABLETS BY MOUTH  EVERY MORNING. TAKE AN  ADDITIONAL TABLET IF YOU  TAKE EXTRA TORSEMIDE 01/30/17   Bensimhon, Shaune Pascal, MD  sertraline (ZOLOFT) 50 MG tablet Take 100 mg by mouth at bedtime.     [provider]  spironolactone (ALDACTONE) 25 MG tablet TAKE 1 TABLET BY MOUTH  DAILY 12/05/16   Josue Hector, MD  torsemide (DEMADEX) 20 MG tablet Take 2 tablets (40 mg total) by mouth 2 (two) times daily. 03/21/17   Bensimhon, Shaune Pascal, MD   Allergies  Allergen Reactions  . Celebrex [Celecoxib] Swelling  . Cephalexin Hives  . Atorvastatin Other (See Comments)  . Cephalosporins     REACTION: rash  . Lasix [Furosemide] Hives  . Oxycodone-Acetaminophen Nausea Only  . Penicillins Other (See Comments)    Not known  . Oxycodone Anxiety    Felt weird    FAMILY HISTORY:  family history includes Cancer in her mother; Diabetes in her father; Leukemia in her father. SOCIAL HISTORY:  reports that  has never smoked. she has never used smokeless tobacco. She reports that she does not drink alcohol or use drugs.  REVIEW OF SYSTEMS:     Positive for shortness of breath, cough with clear sputum, wheezing improved with nebs this a.m.  Constitutional: Negative for fever, chills, weight loss, malaise/fatigue and diaphoresis.  HENT: Negative for hearing loss, ear pain, nosebleeds, congestion, sore throat, neck pain, tinnitus and ear discharge.   Eyes: Negative for blurred vision, double vision, photophobia, pain, discharge and redness.  Respiratory: Negative for cough, hemoptysis,  and stridor.   Cardiovascular: Negative for chest pain, palpitations, orthopnea, claudication, leg swelling and PND.  Gastrointestinal: Negative for heartburn, nausea, vomiting, abdominal pain, diarrhea, constipation, blood in stool and melena.  Genitourinary: Negative for dysuria, urgency, frequency, hematuria and flank pain.  Musculoskeletal: Negative for myalgias, back pain, joint pain and falls.  Skin: Negative for itching and rash.  Neurological: Negative for dizziness, tingling, tremors, sensory change, speech change, focal weakness, seizures, loss of consciousness, weakness and headaches.  Endo/Heme/Allergies: Negative for environmental allergies and polydipsia. Does not bruise/bleed easily.  SUBJECTIVE:   VITAL SIGNS: Temp:  [97.9 F (36.6 C)-98.4 F (36.9 C)] 98.4 F (36.9 C) (11/14 1231) Pulse Rate:  [108-113] 108 (11/14 1231) Resp:  [21] 21 (11/14 1231) BP: (118-128)/(74-77) 118/77 (11/14 1231) SpO2:  [90 %-  94 %] 94 % (11/14 1231) Weight:  [314 lb (142.4 kg)] 314 lb (142.4 kg) (11/14 1231)  PHYSICAL EXAMINATION: Gen. Pleasant, obese, in no distress, anxious affect ENT - no lesions, no post nasal drip, class 2-3 airway Neck: No JVD, no thyromegaly, no carotid bruits Lungs: no use of accessory muscles, no dullness to percussion, decreased without rales or rhonchi  Cardiovascular: Rhythm regular, heart sounds  normal, no murmurs or gallops, no peripheral edema Abdomen: soft and non-tender, no hepatosplenomegaly, BS  normal. Musculoskeletal: No deformities, no cyanosis or clubbing Neuro:  alert, non focal, no tremors    No results for input(s): NA, K, CL, CO2, BUN, CREATININE, GLUCOSE in the last 168 hours. Recent Labs  Lab 04/03/17 1254  HGB 12.5  HCT 38.1  WBC 13.9*  PLT 365   No results found.  ASSESSMENT / PLAN:  Acute respiratory failure with hypoxia-causes unclear but most likely related to acute bronchitis. No evidence of acute pulmonary edema or fluid overload.  Plan-admit to telemetry from the office. Obtain basic blood work and chest x-ray, check troponin and BNP for completion  We will treat with IV Solu-Medrol and IV ceftriaxone Duo nebs as needed 2 L oxygen, remains to be seen whether she will need oxygen on discharge  OSA/OHS-his auto BiPAP during sleep.  Diabetes type 2, insulin requiring -resume home medications including NPH. SSI resistant scale.  Chronic systolic heart failure-continue torsemide 20 twice daily, hold Aldactone for now   Kara Mead MD. FCCP. [facility] Pager [phone] If no response call [phone]    04/03/2017, 1:23 PM

## 2017-04-03 NOTE — Progress Notes (Signed)
   Subjective:    Patient ID: Nina Howell, female    DOB: [birth year], 76 y.o.   MRN: [id number]  HPI   76/F Never smoker morbidly obese for FU of obstructive sleep apnea/ OHS .  She also has bullous pemphigoid On prednisone Transiently  Last seen 03/2016. She has been calling the office for cough, was initially offered symptomatic treatment and offered an office visit last week but could not make it. Accompanied by her daughter and grand daughter today, was found to be hypoxic on arrival and placed on oxygen.  Complains of shortness of breath and productive cough with clear mucus for 1 week  She was also diagnosed with Chronic systolic heart failure -EF of 35%. Pericardial effusion was noted which seemed to resolve on follow-up echo, last done 10/2016 she has follow-up with the advanced heart failure program  She has transiently required oxygen in the past      Significant tests/ events  08/19/2015  admission for acute respiratory failure with hypoxia and hypercarbia thought to be multifactorial combination of COPD/asthma/CHF ( likely 2/2 increased pulmonary hypertension in context of obesity and COPD ).   08/2015:2-D echo shows severely dilated RV with severely reduced RV systolic function(new-not seen on prior Echo) PA pressure of 69 ANA 1: 160 speckled, other autoimmune workup negative VQ scan and CT angiogram did not show pulmonary embolus. 11/2015 repeat echo >>. Pressures decreased  Adm 03/2010 with worsening dyspnea &hypoxia, VQ neg, BNP 103, diuresed, ABG 7.43/63/89/97% on 2 L, discharged on 2 L &Bilevel 14/8  PSG 12/2011showed moderate obstructive sleep apnea with AHi 15/h.       Review of Systems neg for any significant sore throat, dysphagia, itching, sneezing, nasal congestion or excess/ purulent secretions, fever, chills, sweats, unintended wt loss, pleuritic or exertional cp, hempoptysis, orthopnea pnd or change in chronic leg swelling. Also  denies presyncope, palpitations, heartburn, abdominal pain, nausea, vomiting, diarrhea or change in bowel or urinary habits, dysuria,hematuria, rash, arthralgias, visual complaints, headache, numbness weakness or ataxia.     Objective:   Physical Exam  Gen. Pleasant, obese, in no distress ENT - no lesions, no post nasal drip Neck: No JVD, no thyromegaly, no carotid bruits Lungs: no use of accessory muscles, no dullness to percussion, decreased without rales or rhonchi  Cardiovascular: Rhythm regular, heart sounds  normal, no murmurs or gallops, no peripheral edema Musculoskeletal: No deformities, no cyanosis or clubbing , no tremors       Assessment & Plan:

## 2017-04-03 NOTE — Assessment & Plan Note (Addendum)
Hospital admission with telemetry  Differential diagnosis includes acute bronchitis No evidence of acute pulmonary edema or fluid overload.

## 2017-04-03 NOTE — Progress Notes (Signed)
Labs reviewed- Since sugars are very high, will discontinue Solu-Medrol and hold off on steroids unless she develops more bronchospasm.  Will continue duo nebs  BNP is low but chest x-ray does suggest interstitial changes, will continue oral torsemide, she is listed allergic to furosemide  D-dimer slight high, likely nonspecific but will at least obtain venous duplex both lower extremities  Severe hypomagnesemia will be aggressively repleted  Davonna Belling V. Elsworth Soho MD

## 2017-04-04 ENCOUNTER — Inpatient Hospital Stay (HOSPITAL_COMMUNITY): Payer: BLUE CROSS/BLUE SHIELD

## 2017-04-04 DIAGNOSIS — E662 Morbid (severe) obesity with alveolar hypoventilation: Secondary | ICD-10-CM | POA: Diagnosis not present

## 2017-04-04 DIAGNOSIS — J810 Acute pulmonary edema: Secondary | ICD-10-CM

## 2017-04-04 DIAGNOSIS — Z880 Allergy status to penicillin: Secondary | ICD-10-CM | POA: Diagnosis not present

## 2017-04-04 DIAGNOSIS — R7989 Other specified abnormal findings of blood chemistry: Secondary | ICD-10-CM | POA: Diagnosis not present

## 2017-04-04 DIAGNOSIS — I313 Pericardial effusion (noninflammatory): Secondary | ICD-10-CM | POA: Diagnosis not present

## 2017-04-04 DIAGNOSIS — R739 Hyperglycemia, unspecified: Secondary | ICD-10-CM | POA: Diagnosis not present

## 2017-04-04 DIAGNOSIS — J209 Acute bronchitis, unspecified: Secondary | ICD-10-CM | POA: Diagnosis not present

## 2017-04-04 DIAGNOSIS — K7460 Unspecified cirrhosis of liver: Secondary | ICD-10-CM | POA: Diagnosis not present

## 2017-04-04 DIAGNOSIS — E119 Type 2 diabetes mellitus without complications: Secondary | ICD-10-CM | POA: Diagnosis not present

## 2017-04-04 DIAGNOSIS — I2721 Secondary pulmonary arterial hypertension: Secondary | ICD-10-CM | POA: Diagnosis not present

## 2017-04-04 DIAGNOSIS — I5022 Chronic systolic (congestive) heart failure: Secondary | ICD-10-CM | POA: Diagnosis not present

## 2017-04-04 DIAGNOSIS — G4733 Obstructive sleep apnea (adult) (pediatric): Secondary | ICD-10-CM | POA: Diagnosis not present

## 2017-04-04 DIAGNOSIS — Z6841 Body Mass Index (BMI) 40.0 and over, adult: Secondary | ICD-10-CM | POA: Diagnosis not present

## 2017-04-04 DIAGNOSIS — J9601 Acute respiratory failure with hypoxia: Secondary | ICD-10-CM | POA: Diagnosis not present

## 2017-04-04 DIAGNOSIS — L120 Bullous pemphigoid: Secondary | ICD-10-CM | POA: Diagnosis not present

## 2017-04-04 DIAGNOSIS — Z888 Allergy status to other drugs, medicaments and biological substances status: Secondary | ICD-10-CM | POA: Diagnosis not present

## 2017-04-04 DIAGNOSIS — R0602 Shortness of breath: Secondary | ICD-10-CM | POA: Diagnosis not present

## 2017-04-04 DIAGNOSIS — E785 Hyperlipidemia, unspecified: Secondary | ICD-10-CM | POA: Diagnosis not present

## 2017-04-04 DIAGNOSIS — Z881 Allergy status to other antibiotic agents status: Secondary | ICD-10-CM | POA: Diagnosis not present

## 2017-04-04 DIAGNOSIS — I110 Hypertensive heart disease with heart failure: Secondary | ICD-10-CM | POA: Diagnosis not present

## 2017-04-04 DIAGNOSIS — Z885 Allergy status to narcotic agent status: Secondary | ICD-10-CM | POA: Diagnosis not present

## 2017-04-04 LAB — GLUCOSE, CAPILLARY
GLUCOSE-CAPILLARY: 97 mg/dL (ref 65–99)
GLUCOSE-CAPILLARY: 132 mg/dL — AB (ref 65–99)
GLUCOSE-CAPILLARY: 206 mg/dL — AB (ref 65–99)
GLUCOSE-CAPILLARY: 185 mg/dL — AB (ref 65–99)
Glucose-Capillary: 192 mg/dL — ABNORMAL HIGH (ref 65–99)

## 2017-04-04 LAB — BASIC METABOLIC PANEL
ANION GAP: 12 (ref 5–15)
BUN: 16 mg/dL (ref 6–20)
CALCIUM: 8.7 mg/dL — AB (ref 8.9–10.3)
CHLORIDE: 94 mmol/L — AB (ref 101–111)
CO2: 31 mmol/L (ref 22–32)
Creatinine, Ser: 0.74 mg/dL (ref 0.44–1.00)
GFR calc Af Amer: >60 mL/min (ref >60)
GFR calc non Af Amer: >60 mL/min (ref >60)
GLUCOSE: 93 mg/dL (ref 65–99)
POTASSIUM: 3.3 mmol/L — AB (ref 3.5–5.1)
Sodium: 137 mmol/L (ref 135–145)

## 2017-04-04 LAB — TROPONIN I

## 2017-04-04 NOTE — Progress Notes (Signed)
Bilateral lower extremity venous duplex completed. No evidence of DVT, suoerficial thrombosis, or Baker's cyst.  Toma Copier, RVS 04/04/2017 11:40 am

## 2017-04-04 NOTE — Care Management Note (Signed)
Case Management Note  Patient Details  Name: Nini Cavan MRN: [id number] Date of Birth: 09-28-1940  Subjective/Objective: 76 y/o f admitted w/Acute resp failure. WU:[id number] sleep apnea,morbid obesity,From home. Pulmonary/PCCM following. On 02-will monitor if needed @ home. PT cons-await recc.                   Action/Plan:dj/c home.   Expected Discharge Date:  (unknown)               Expected Discharge Plan:  [place]  In-House Referral:     Discharge planning Services  CM Consult  Post Acute Care Choice:    Choice offered to:     DME Arranged:    DME Agency:     HH Arranged:    HH Agency:     Status of Service:  In process, will continue to follow  If discussed at Long Length of Stay Meetings, dates discussed:    Additional Comments:  Dessa Phi, RN 04/04/2017, 12:36 PM

## 2017-04-04 NOTE — H&P (Signed)
Name: Nina Howell MRN: [id number] DOB: May 10, 1941    ADMISSION DATE:  04/03/2017 HISTORY OF PRESENT ILLNESS:    76/F Never smoker morbidly obese for FU of obstructive sleep apnea/ OHS .  Shealso hasbullous pemphigoid On prednisone Transiently  Last seen 03/2016. She has been calling the office for cough, was initially offered symptomatic treatment and offered an office visit last week but could not make it. Accompanied by her daughter and grand daughter today, was found to be hypoxic on arrival and placed on oxygen.  Complains of shortness of breath and productive cough with clear mucus for 1 week  She was also diagnosed with Chronic systolic heart failure -EF of 35%. Pericardial effusion was noted which seemed to resolve on follow-up echo, last done 10/2016 she has follow-up with the advanced heart failure program  She has transiently required oxygen in the past  Significant tests/ events  08/19/2015 admission for acute respiratory failure with hypoxia and hypercarbia thought to be multifactorial combination of COPD/asthma/CHF ( likely 2/2 increased pulmonary hypertension in context of obesity and COPD ).   08/2015:2-D echo shows severely dilated RV with severely reduced RV systolic function(new-not seen on prior Echo) PA pressure of 69 ANA 1: 160 speckled, other autoimmune workup negative VQ scan and CT angiogram did not show pulmonary embolus. 11/2015 repeat echo >>. Pressures decreased   PSG 12/2011showed moderate obstructive sleep apnea with AHi 15/h.   SUBJECTIVE:  BiPAP overnight while asleep but now to 2L [place], no complaints  VITAL SIGNS: Temp:  [97.9 F (36.6 C)-98.4 F (36.9 C)] 98 F (36.7 C) (11/15 0517) Pulse Rate:  [84-113] 90 (11/15 0517) Resp:  [18-22] 22 (11/15 0517) BP: (109-128)/(67-77) 120/71 (11/15 0517) SpO2:  [90 %-94 %] 91 % (11/15 0517) Weight:  [142.4 kg (314 lb)] 142.4 kg (314 lb) (11/14 1231)  PHYSICAL EXAMINATION: Gen.  Pleasant, obese, NA ENT - [place]/AT, PERRL, EOM-I and MMM Neck: Supple, -LAN and JVD Lungs: Decreased BS at the bases Cardiovascular: RRR, Nl S1/S2, -M/R/G. Abdomen: Soft, NT, ND and +BS Musculoskeletal: No deformities, no cyanosis or clubbing Neuro:  alert, non focal, no tremors  Recent Labs  Lab 04/03/17 1254 04/04/17 0417  NA 136 137  K 3.5 3.3*  CL 90* 94*  CO2 31 31  BUN 16 16  CREATININE 1.01* 0.74  GLUCOSE 312* 93   Recent Labs  Lab 04/03/17 1254  HGB 12.5  HCT 38.1  WBC 13.9*  PLT 365   Dg Chest 2 View  Result Date: 04/03/2017 CLINICAL DATA:  Cough and shortness of breath EXAM: CHEST  2 VIEW COMPARISON:  Chest x-ray of August 25, 2015 and chest CT scan of August 26, 2015. FINDINGS: The lungs are mildly hyperinflated. The interstitial markings are coarse. The cardiac silhouette is enlarged. The central pulmonary vascularity is engorged. There is no pleural effusion. There is mild multilevel degenerative disc disease of the thoracic spine. IMPRESSION: Mild compensated CHF.  No alveolar edema or pneumonia. Electronically Signed   By: David  Martinique M.D.   On: 04/03/2017 14:48   I reviewed CXR myself, mild pulmonary edema  ASSESSMENT / PLAN:  Acute respiratory failure with hypoxia-causes unclear but most likely related to acute bronchitis. No evidence of acute pulmonary edema or fluid overload.  Plan- - Keep on telemetry - Levofloxacin - F/u on cultures  Hypoxemia: - Titrate O2 for sat of 88-92% - Will need to qualify for home O2 prior to discharge  OSA/OHS - BiPAP while asleep - Will need  to determine if patient would use at home given her PSG and size  Diabetes type 2, insulin requiring  - Resume home medications including NPH. - SSI resistant scale. - D/C steroids  Chronic systolic heart failure - Torsamide 20 mg PO BID, needs volume negative   Discussed with PCCM-NP.  Rush Farmer, M.D. [facility]. Pager:  [phone]. After hours pager: [phone]  04/04/2017, 8:44 AM

## 2017-04-05 DIAGNOSIS — I110 Hypertensive heart disease with heart failure: Secondary | ICD-10-CM | POA: Diagnosis not present

## 2017-04-05 DIAGNOSIS — Z880 Allergy status to penicillin: Secondary | ICD-10-CM | POA: Diagnosis not present

## 2017-04-05 DIAGNOSIS — I313 Pericardial effusion (noninflammatory): Secondary | ICD-10-CM | POA: Diagnosis not present

## 2017-04-05 DIAGNOSIS — I2721 Secondary pulmonary arterial hypertension: Secondary | ICD-10-CM | POA: Diagnosis not present

## 2017-04-05 DIAGNOSIS — J9600 Acute respiratory failure, unspecified whether with hypoxia or hypercapnia: Secondary | ICD-10-CM

## 2017-04-05 DIAGNOSIS — J209 Acute bronchitis, unspecified: Secondary | ICD-10-CM | POA: Diagnosis not present

## 2017-04-05 DIAGNOSIS — Z881 Allergy status to other antibiotic agents status: Secondary | ICD-10-CM | POA: Diagnosis not present

## 2017-04-05 DIAGNOSIS — Z888 Allergy status to other drugs, medicaments and biological substances status: Secondary | ICD-10-CM | POA: Diagnosis not present

## 2017-04-05 DIAGNOSIS — J9601 Acute respiratory failure with hypoxia: Secondary | ICD-10-CM | POA: Diagnosis not present

## 2017-04-05 DIAGNOSIS — E662 Morbid (severe) obesity with alveolar hypoventilation: Secondary | ICD-10-CM | POA: Diagnosis not present

## 2017-04-05 DIAGNOSIS — Z885 Allergy status to narcotic agent status: Secondary | ICD-10-CM | POA: Diagnosis not present

## 2017-04-05 DIAGNOSIS — E119 Type 2 diabetes mellitus without complications: Secondary | ICD-10-CM | POA: Diagnosis not present

## 2017-04-05 DIAGNOSIS — K7460 Unspecified cirrhosis of liver: Secondary | ICD-10-CM | POA: Diagnosis not present

## 2017-04-05 DIAGNOSIS — L120 Bullous pemphigoid: Secondary | ICD-10-CM | POA: Diagnosis not present

## 2017-04-05 DIAGNOSIS — E785 Hyperlipidemia, unspecified: Secondary | ICD-10-CM | POA: Diagnosis not present

## 2017-04-05 DIAGNOSIS — I5022 Chronic systolic (congestive) heart failure: Secondary | ICD-10-CM | POA: Diagnosis not present

## 2017-04-05 DIAGNOSIS — Z6841 Body Mass Index (BMI) 40.0 and over, adult: Secondary | ICD-10-CM | POA: Diagnosis not present

## 2017-04-05 LAB — GLUCOSE, CAPILLARY
GLUCOSE-CAPILLARY: 184 mg/dL — AB (ref 65–99)
Glucose-Capillary: 128 mg/dL — ABNORMAL HIGH (ref 65–99)
Glucose-Capillary: 259 mg/dL — ABNORMAL HIGH (ref 65–99)
Glucose-Capillary: 84 mg/dL (ref 65–99)

## 2017-04-05 MED ORDER — IPRATROPIUM-ALBUTEROL 0.5-2.5 (3) MG/3ML IN SOLN
3.0000 mL | RESPIRATORY_TRACT | Status: DC | PRN
  Administered 2017-04-05: 3 mL via RESPIRATORY_TRACT

## 2017-04-05 MED ORDER — LEVOFLOXACIN 500 MG PO TABS: 500.0000 mg | ORAL_TABLET | Freq: Every day | ORAL | 0 refills | Status: AC

## 2017-04-05 NOTE — Care Management Note (Signed)
Case Management Note  Patient Details  Name: Nina Howell MRN: [id number] Date of Birth: [birth year]  Subjective/Objective:  76 y/o f admitted w/Respiratory failure. From home.PT-recc HHPT-patient chose [facility]-rep Tim aware.   No further CM needs.                Action/Plan:d/c home w/HHC.   Expected Discharge Date:  04/05/17               Expected Discharge Plan:  [place]  In-House Referral:     Discharge planning Services  CM Consult  Post Acute Care Choice:    Choice offered to:     DME Arranged:    DME Agency:     HH Arranged:  PT [place]:  [facility] ([facility])  Status of Service:  Completed, signed off  If discussed at [employer], dates discussed:    Additional Comments:  Dessa Phi, RN 04/05/2017, 12:43 PM

## 2017-04-05 NOTE — Discharge Summary (Signed)
Physician Discharge Summary       Patient ID: Nina Howell MRN: [id number] DOB/AGE: 1941-05-15 76 y.o.  Admit date: 04/03/2017 Discharge date: 04/05/2017  Discharge Diagnoses:  Acute bronchitis Hypoxemia Obstructive sleep apnea Obesity hypoventilation syndrome Chronic systolic heart failure Diabetes type 2  Detailed Hospital Course:  76 year old morbidly obese female patient followed by Dr. Elsworth Soho for obstructive sleep apnea.  Had been calling the office for about 1 week prior to presentation with chief complaint of cough.  On arrival to her office visit she was found to be hypoxic and therefore placed on oxygen.  She complained of worsening shortness of breath, cough with clear mucus for about 1 week time.  Recently diagnosed with chronic systolic heart failure with an ejection fraction of 35%.  Followed by the advanced heart failure team.  She was admitted with a working diagnosis of acute respiratory failure felt likely due to acute bronchitis resulting in exacerbation of underlying pulmonary artery hypertension.  She was admitted to the telemetry unit therapeutic interventions included supplemental oxygen, IV antibiotics, and nocturnal BiPAP.  She made slow but progressive improvement over the course of her hospitalization and was deemed ready for discharge as of 11/16.  She will be discharged home with the following plan of care.   -walking oximetry was checked: lowest oxygen sats were 89% on room air during ambulation.    Discharge Plan by active problems   Acute hypoxic respiratory failure in the setting of acute bronchitis Plan Now day #3 of 5 Levaquin ROV Tammy parrett NP at 1145 on Nov 21   OSA/OHS Plan Cont home autoset BIPAP 26/3  Chronic systolic heart failure Plan Continue torsemide 20 mg p.o. twice daily  Diabetes Plan Continue home regimen   Significant Hospital tests/ studies  Consults: none   Discharge Exam: BP 102/65 (BP Location: Left Arm)    Pulse (!) 112   Temp 98.2 F (36.8 C) (Oral)   Resp 20   Ht 5\' 1"  (1.549 m)   Wt (!) 318 lb (144.2 kg)   SpO2 93% Comment: pt has a cont. pulse ox  BMI 60.09 kg/m   General: MOAAF awake, alert, no distress.  HENT: NCAT, no JVD MMM Pulm some faint exp wheeze seemingly from upper airway. No accessory use  Card RRR no MRG Ext/MS: equal st and bulk, strong pulses no sig edema Abd: soft not tender + bowel sounds Neuro/psych: awake and alert no focal def   Labs at discharge Lab Results  Component Value Date   CREATININE 0.74 04/04/2017   BUN 16 04/04/2017   NA 137 04/04/2017   K 3.3 (L) 04/04/2017   CL 94 (L) 04/04/2017   CO2 31 04/04/2017   Lab Results  Component Value Date   WBC 13.9 (H) 04/03/2017   HGB 12.5 04/03/2017   HCT 38.1 04/03/2017   MCV 90.7 04/03/2017   PLT 365 04/03/2017   Lab Results  Component Value Date   ALT 11 (L) 04/03/2017   AST 17 04/03/2017   ALKPHOS 102 04/03/2017   BILITOT 0.7 04/03/2017   Lab Results  Component Value Date   INR 1.23 08/21/2015   INR 0.92 03/21/2012   INR 1.00 03/23/2010    Current radiology studies Dg Chest 2 View  Result Date: 04/03/2017 CLINICAL DATA:  Cough and shortness of breath EXAM: CHEST  2 VIEW COMPARISON:  Chest x-ray of August 25, 2015 and chest CT scan of August 26, 2015. FINDINGS: The lungs are mildly hyperinflated. The interstitial markings  are coarse. The cardiac silhouette is enlarged. The central pulmonary vascularity is engorged. There is no pleural effusion. There is mild multilevel degenerative disc disease of the thoracic spine. IMPRESSION: Mild compensated CHF.  No alveolar edema or pneumonia. Electronically Signed   By: David  Martinique M.D.   On: 04/03/2017 14:48    Disposition:  06-Home-Health Care Svc  Discharge Instructions    Diet - low sodium heart healthy   Complete by:  As directed    Increase activity slowly   Complete by:  As directed      Allergies as of 04/05/2017      Reactions    Celebrex [celecoxib] Swelling   Penicillins Rash   Has patient had a PCN reaction causing immediate rash, facial/tongue/throat swelling, SOB or lightheadedness with hypotension: Yes Has patient had a PCN reaction causing severe rash involving mucus membranes or skin necrosis: Yes Has patient had a PCN reaction that required hospitalization: No Has patient had a PCN reaction occurring within the last 10 years: Yes - Okay taking other cillin's If all of the above answers are "NO", then may proceed with Cephalosporin use.   Cephalexin Hives   Lasix [furosemide] Hives   Oxycodone-acetaminophen Nausea Only   Cephalosporins Rash   Oxycodone Anxiety   Felt weird      Medication List    STOP taking these medications   doxycycline 100 MG capsule Commonly known as:  VIBRAMYCIN   Influenza vac split quadrivalent PF 0.5 ML injection Commonly known as:  FLUZONE HIGH-DOSE   mupirocin ointment 2 % Commonly known as:  BACTROBAN     TAKE these medications   acetaminophen 500 MG tablet Commonly known as:  TYLENOL Take 1,000 mg every 8 (eight) hours as needed by mouth for mild pain, moderate pain, fever or headache.   albuterol 108 (90 Base) MCG/ACT inhaler Commonly known as:  PROVENTIL HFA;VENTOLIN HFA Inhale 2 puffs into the lungs every 4 (four) hours as needed for wheezing or shortness of breath.   aspirin 81 MG tablet Take 81 mg at bedtime by mouth.   atorvastatin 20 MG tablet Commonly known as:  LIPITOR Take 20 mg by mouth every other day.   beclomethasone 80 MCG/ACT inhaler Commonly known as:  QVAR Inhale 2 puffs into the lungs 2 (two) times daily.   Cranberry 400 MG Tabs Take 1,600 mg daily by mouth.   fluocinonide 0.05 % external solution Commonly known as:  LIDEX Apply 1 application topically 2 (two) times daily as needed (dermatosis).   guaiFENesin 600 MG 12 hr tablet Commonly known as:  MUCINEX Take 1,200 mg 2 (two) times daily by mouth. Started 10/30   insulin  NPH-regular Human (70-30) 100 UNIT/ML injection Commonly known as:  NOVOLIN 70/30 Inject 25-42 Units 2 (two) times daily with a meal into the skin. Takes 42 UNITS IN AM and 25 UNITS IN PM   levofloxacin 500 MG tablet Commonly known as:  LEVAQUIN Take 1 tablet (500 mg total) daily for 3 doses by mouth.   levothyroxine 50 MCG tablet Commonly known as:  SYNTHROID, LEVOTHROID Take 50 mcg by mouth daily.   lidocaine 5 % ointment Commonly known as:  XYLOCAINE Apply 1 application 2 (two) times daily topically.   metFORMIN 1000 MG tablet Commonly known as:  GLUCOPHAGE Take 1,000 mg by mouth 2 (two) times daily with a meal.   multivitamin with minerals Tabs tablet Take 0.5 tablets by mouth daily.   niacinamide 500 MG tablet Take 500 mg by  mouth 2 (two) times daily with a meal.   polyethylene glycol packet Commonly known as:  MIRALAX / GLYCOLAX Take 17 g by mouth daily as needed. What changed:  reasons to take this   potassium chloride SA 20 MEQ tablet Commonly known as:  K-DUR,KLOR-CON TAKE 2 TABLETS BY MOUTH  EVERY MORNING. TAKE AN  ADDITIONAL TABLET IF YOU  TAKE EXTRA TORSEMIDE   sertraline 50 MG tablet Commonly known as:  ZOLOFT Take 100 mg by mouth at bedtime.   spironolactone 25 MG tablet Commonly known as:  ALDACTONE TAKE 1 TABLET BY MOUTH  DAILY   torsemide 20 MG tablet Commonly known as:  DEMADEX Take 2 tablets (40 mg total) by mouth 2 (two) times daily.   VICTOZA 18 MG/3ML Sopn Generic drug:  liraglutide Inject 1.8 mg into the skin daily.   Vitamin B-12 2500 MCG Subl Place 1 tablet under the tongue daily.   Vitamin D 2000 units tablet Take 2,000 Units daily by mouth.        Discharged Condition: good  Physician Statement:   The Patient was personally examined, the discharge assessment and plan has been personally reviewed and I agree with ACNP Shanell Aden's assessment and plan.  32 minutes of time have been dedicated to discharge assessment, planning and  discharge instructions.   Signed: Clementeen Graham 04/05/2017, 10:38 AM

## 2017-04-05 NOTE — Telephone Encounter (Signed)
Please send prescription to DME for new BiPAP machine  -13/8 humidity, check download in 1 month,  Please leave sample for QVAR if available for her to pick up

## 2017-04-05 NOTE — Care Management Note (Signed)
Case Management Note  Patient Details  Name: Nina Howell MRN: [id number] Date of Birth: 1941-04-07  Subjective/Objective:  Per rep Tim for [facility]-unable to accept patient. [name] rep Santiago Glad able to accept. Patient agree to [facility] for [place]. No further CM needs.                  Action/Plan:d/c home w/HHC.   Expected Discharge Date:  04/05/17               Expected Discharge Plan:  [place]  In-House Referral:     Discharge planning Services  CM Consult  Post Acute Care Choice:    Choice offered to:     DME Arranged:    DME Agency:     HH Arranged:  PT [place]:  [place]  Status of Service:  Completed, signed off  If discussed at [place] of Stay Meetings, dates discussed:    Additional Comments:  Dessa Phi, RN 04/05/2017, 1:01 PM

## 2017-04-05 NOTE — Evaluation (Signed)
Physical Therapy Evaluation Patient Details Name: Nina Howell MRN: [id number] DOB: 30-Jun-1940 Today's Date: 04/05/2017   History of Present Illness  76 year old female with hx of morbidly obese, obstructive sleep apnea, CHF, COPD and admitted for acute respiratory failure most likely related to acute bronchitis  Clinical Impression  Pt admitted with above diagnosis. Pt currently with functional limitations due to the deficits listed below (see PT Problem List). Pt will benefit from skilled PT to increase their independence and safety with mobility to allow discharge to the venue listed below.  Pt had been in bathroom with NT bathing and agreeable to ambulate as tolerated.  SPO2 89% on room air during ambulation.  Pt reports she typically ambulates around home with SPC.  Pt feels comfortable with d/c plan for home and agreeable to HHPT.     Follow Up Recommendations Home health PT;Supervision for mobility/OOB    Equipment Recommendations  None recommended by PT    Recommendations for Other Services       Precautions / Restrictions Precautions Precaution Comments: monitor sats Restrictions Weight Bearing Restrictions: No      Mobility  Bed Mobility Overal bed mobility: Needs Assistance Bed Mobility: Sit to Supine       Sit to supine: Mod assist   General bed mobility comments: assist for LEs onto bed  Transfers Overall transfer level: Needs assistance Equipment used: Straight cane Transfers: Sit to/from Stand Sit to Stand: Min guard         General transfer comment: min/guard for safety, pt utilizes [facility] for sit to stand transfers (does this at home)  Ambulation/Gait Ambulation/Gait assistance: Min guard Ambulation Distance (Feet): 50 Feet Assistive device: Rolling walker (2 wheeled) Gait Pattern/deviations: Step-through pattern;Trunk flexed;Decreased stride length     General Gait Details: required UE support, agreeable to use RW, a couple standing rest  breaks with upper extremities propped on RW, SPO2 89% on room air at lowest (pt would like it noted that she was in bathroom and washing up prior to PT arrival)  Stairs            Wheelchair Mobility    Modified Rankin (Stroke Patients Only)       Balance Overall balance assessment: Needs assistance         Standing balance support: Single extremity supported Standing balance-Leahy Scale: Poor Standing balance comment: requires at least one UE support                             Pertinent Vitals/Pain Pain Assessment: No/denies pain    Home Living Family/patient expects to be discharged to:: Private residence Living Arrangements: Spouse/significant other   Type of Home: House Home Access: Level entry     Home Layout: Two level;Bed/bath upstairs Home Equipment: Walker - 4 wheels;Cane - single point;Bedside commode      Prior Function Level of Independence: Independent with assistive device(s)         Comments: typically mobilizes around home with [facility], reports some assistance for bathing     Hand Dominance        Extremity/Trunk Assessment        Lower Extremity Assessment Lower Extremity Assessment: Generalized weakness       Communication   Communication: No difficulties  Cognition Arousal/Alertness: Awake/alert Behavior During Therapy: WFL for tasks assessed/performed Overall Cognitive Status: Within Functional Limits for tasks assessed  General Comments      Exercises     Assessment/Plan    PT Assessment Patient needs continued PT services  PT Problem List Decreased strength;Decreased balance;Decreased activity tolerance;Decreased mobility       PT Treatment Interventions Gait training;DME instruction;Functional mobility training;Therapeutic activities;Balance training;Patient/family education;Therapeutic exercise    PT Goals (Current goals can be found in the  Care Plan section)  Acute Rehab PT Goals PT Goal Formulation: With patient Time For Goal Achievement: 04/19/17 Potential to Achieve Goals: Good    Frequency Min 3X/week   Barriers to discharge        Co-evaluation               AM-PAC PT "6 Clicks" Daily Activity  Outcome Measure Difficulty turning over in bed (including adjusting bedclothes, sheets and blankets)?: None Difficulty moving from lying on back to sitting on the side of the bed? : Unable Difficulty sitting down on and standing up from a chair with arms (e.g., wheelchair, bedside commode, etc,.)?: Unable Help needed moving to and from a bed to chair (including a wheelchair)?: A Little Help needed walking in hospital room?: A Little Help needed climbing 3-5 steps with a railing? : A Lot 6 Click Score: 14    End of Session Equipment Utilized During Treatment: Oxygen Activity Tolerance: Patient tolerated treatment well Patient left: in bed;with call bell/phone within reach;with nursing/sitter in room Nurse Communication: Mobility status PT Visit Diagnosis: Other abnormalities of gait and mobility (R26.89)    Time: 1610-9604 PT Time Calculation (min) (ACUTE ONLY): 24 min   Charges:   PT Evaluation $PT Eval Low Complexity: 1 Low     PT G CodesCarmelia Bake, PT, DPT 04/05/2017 Pager: [phone]   York Ram E 04/05/2017, 10:43 AM

## 2017-04-08 ENCOUNTER — Ambulatory Visit: Payer: BLUE CROSS/BLUE SHIELD | Admitting: Pulmonary Disease

## 2017-04-09 NOTE — Telephone Encounter (Signed)
Order has been placed. Will close this message. 

## 2017-04-10 ENCOUNTER — Encounter: Payer: Self-pay | Admitting: Adult Health

## 2017-04-10 ENCOUNTER — Ambulatory Visit (INDEPENDENT_AMBULATORY_CARE_PROVIDER_SITE_OTHER): Payer: BLUE CROSS/BLUE SHIELD | Admitting: Adult Health

## 2017-04-10 DIAGNOSIS — I5032 Chronic diastolic (congestive) heart failure: Secondary | ICD-10-CM | POA: Diagnosis not present

## 2017-04-10 DIAGNOSIS — E662 Morbid (severe) obesity with alveolar hypoventilation: Secondary | ICD-10-CM

## 2017-04-10 DIAGNOSIS — J209 Acute bronchitis, unspecified: Secondary | ICD-10-CM | POA: Diagnosis not present

## 2017-04-10 MED ORDER — MOMETASONE FURO-FORMOTEROL FUM 100-5 MCG/ACT IN AERO: 2.0000 | INHALATION_SPRAY | Freq: Two times a day (BID) | RESPIRATORY_TRACT | 5 refills | Status: DC

## 2017-04-10 MED ORDER — MOMETASONE FURO-FORMOTEROL FUM 200-5 MCG/ACT IN AERO: 2.0000 | INHALATION_SPRAY | Freq: Two times a day (BID) | RESPIRATORY_TRACT | 0 refills | Status: DC

## 2017-04-10 NOTE — Assessment & Plan Note (Signed)
OSA/OHS BIPAP download requested   Plan  Cont on BIPAP At bedtime  .

## 2017-04-10 NOTE — Patient Instructions (Addendum)
Hold on QVAR  Begin Dulera 2 puffs Twice daily  , rinse after use.  Continue on BIPAP At bedtime  .  Continue on low salt diet  Continue on Torsemide and Aldactone.  Mucinex DM Twice daily  As needed  Cough /congestion  Follow up with Dr. Elsworth Soho  6-8 weeks and As needed

## 2017-04-10 NOTE — Addendum Note (Signed)
Addended by: Parke Poisson E on: 04/10/2017 01:25 PM   Modules accepted: Orders

## 2017-04-10 NOTE — Assessment & Plan Note (Signed)
Appears compensated  Cont on current regimen .  

## 2017-04-10 NOTE — Progress Notes (Signed)
@Patient  ID: Nina Howell, female    DOB: 10-21-1940, 76 y.o.   MRN: [id number]  Chief Complaint  Patient presents with  . Follow-up    OSA     Referring provider: Leeroy Cha,*  HPI: 76/F Never smoker morbidly obese for FU of obstructive sleep apnea/ OHS . and Chronic Respiratory failure  Shealso hasbullous pemphigoid On prednisone Transiently CHF , EF 35%  Significant tests/ events  08/19/2015 admission for acute respiratory failure with hypoxia and hypercarbia thought to be multifactorial combination of COPD/asthma/CHF ( likely 2/2 increased pulmonary hypertension in context of obesity and COPD ).   08/2015:2-D echo shows severely dilated RV with severely reduced RV systolic function(new-not seen on prior Echo) PA pressure of 69 ANA 1: 160 speckled, other autoimmune workup negative VQ scan and CT angiogram did not show pulmonary embolus. 11/2015 repeat echo >>. Pressures decreased  Adm 03/2010 with worsening dyspnea &hypoxia, VQ neg, BNP 103, diuresed, ABG 7.43/63/89/97% on 2 L, discharged on 2 L &Bilevel 14/8  PSG 12/2011showed moderate obstructive sleep apnea with AHi 15/h.   04/10/2017 Follow up: OSA and Chronic Oxygen Failure  Pt presents for a post hospital follow up . She was admitted last week for acute on chronic respiratory failure from acute bronchitis . She was tx with abx and discharged on Levaquin .  She was continued on BIPAP At bedtime .  She is feeling some better. Still has lingering cough and congestion with clear mucus . Some intermittent wheezing .  Appetite is down some . No nv/d.  She is out of her QVAR .  No increased leg swelling . Wt remains down . Really working on her weight loss. Down 100lbs.      Allergies  Allergen Reactions  . Celebrex [Celecoxib] Swelling  . Penicillins Rash    Has patient had a PCN reaction causing immediate rash, facial/tongue/throat swelling, SOB or lightheadedness with hypotension:  Yes Has patient had a PCN reaction causing severe rash involving mucus membranes or skin necrosis: Yes Has patient had a PCN reaction that required hospitalization: No Has patient had a PCN reaction occurring within the last 10 years: Yes - Okay taking other cillin's If all of the above answers are "NO", then may proceed with Cephalosporin use.   . Cephalexin Hives  . Lasix [Furosemide] Hives  . Oxycodone-Acetaminophen Nausea Only  . Cephalosporins Rash  . Oxycodone Anxiety    Felt weird    Immunization History  Administered Date(s) Administered  . Influenza Split 03/22/2011  . Influenza Whole 01/23/2010  . Influenza, High Dose Seasonal PF 02/22/2016, 02/18/2017  . Influenza-Unspecified 04/10/2015  . Pneumococcal Conjugate-13 03/05/2014  . Pneumococcal Polysaccharide-23 08/11/2012    Past Medical History:  Diagnosis Date  . Acute pancreatitis   . Anemia   . Asymptomatic cholelithiasis   . Bullous pemphigoid   . Cellulitis    ABDOMINAL WALL  . CHF (congestive heart failure) (HCC)    DECOMPENSATED SYSTOLIC CHF  . CHF (congestive heart failure) ([place])   . CTS (carpal tunnel syndrome)   . Diabetes mellitus, type 2 ([place])   . DJD (degenerative joint disease)   . Hepatic lesion   . Hyperlipidemia   . 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 Hypertension   . Hypokalemia   . Hypothyroidism   . Hypoventilation   . IUD (intrauterine device) in place   . Liver cirrhosis ([place])   . Morbid obesity ([place])   . Rash and nonspecific skin eruption  all over- 12-17-14 "drying in _ Auto immune problem"-seeing dermalogist  . Sleep apnea    cpap  . Varicose veins     Tobacco History: Social History   Tobacco Use  Smoking Status Never Smoker  Smokeless Tobacco Never Used   Counseling given: Not Answered   Outpatient Encounter Medications as of 04/10/2017  Medication Sig  . acetaminophen (TYLENOL) 500 MG tablet Take 1,000 mg every 8 (eight) hours as needed by mouth  for mild pain, moderate pain, fever or headache.  . albuterol (PROVENTIL HFA;VENTOLIN HFA) 108 (90 Base) MCG/ACT inhaler Inhale 2 puffs into the lungs every 4 (four) hours as needed for wheezing or shortness of breath.  Marland Kitchen aspirin 81 MG tablet Take 81 mg at bedtime by mouth.   Marland Kitchen atorvastatin (LIPITOR) 20 MG tablet Take 20 mg by mouth every other day.  . beclomethasone (QVAR) 80 MCG/ACT inhaler Inhale 2 puffs into the lungs 2 (two) times daily.  . Cholecalciferol (VITAMIN D) 2000 units tablet Take 2,000 Units daily by mouth.  . Cranberry 400 MG TABS Take 1,600 mg daily by mouth.  . Cyanocobalamin (VITAMIN B-12) 2500 MCG SUBL Place 1 tablet under the tongue daily.  . fluocinonide (LIDEX) 0.05 % external solution Apply 1 application topically 2 (two) times daily as needed (dermatosis).   Marland Kitchen guaiFENesin (MUCINEX) 600 MG 12 hr tablet Take 1,200 mg 2 (two) times daily by mouth. Started 10/30  . insulin NPH-regular Human (NOVOLIN 70/30) (70-30) 100 UNIT/ML injection Inject 25-42 Units 2 (two) times daily with a meal into the skin. Takes 42 UNITS IN AM and 25 UNITS IN PM  . levothyroxine (SYNTHROID, LEVOTHROID) 50 MCG tablet Take 50 mcg by mouth daily.    Marland Kitchen lidocaine (XYLOCAINE) 5 % ointment Apply 1 application 2 (two) times daily topically.   . Liraglutide (VICTOZA) 18 MG/3ML SOPN Inject 1.8 mg into the skin daily.  . metFORMIN (GLUCOPHAGE) 1000 MG tablet Take 1,000 mg by mouth 2 (two) times daily with a meal.  . Multiple Vitamin (MULTIVITAMIN WITH MINERALS) TABS tablet Take 0.5 tablets by mouth daily.  . niacinamide 500 MG tablet Take 500 mg by mouth 2 (two) times daily with a meal.  . polyethylene glycol (MIRALAX / GLYCOLAX) packet Take 17 g by mouth daily as needed. (Patient taking differently: Take 17 g daily as needed by mouth for mild constipation. )  . potassium chloride SA (K-DUR,KLOR-CON) 20 MEQ tablet TAKE 2 TABLETS BY MOUTH  EVERY MORNING. TAKE AN  ADDITIONAL TABLET IF YOU  TAKE EXTRA TORSEMIDE   . sertraline (ZOLOFT) 50 MG tablet Take 100 mg by mouth at bedtime.   Marland Kitchen spironolactone (ALDACTONE) 25 MG tablet TAKE 1 TABLET BY MOUTH  DAILY  . torsemide (DEMADEX) 20 MG tablet Take 2 tablets (40 mg total) by mouth 2 (two) times daily.   No facility-administered encounter medications on file as of 04/10/2017.      Review of Systems  Constitutional:   No  weight loss, night sweats,  Fevers, chills, + fatigue, or  lassitude.  HEENT:   No headaches,  Difficulty swallowing,  Tooth/dental problems, or  Sore throat,                No sneezing, itching, ear ache, nasal congestion, post nasal drip,   CV:  No chest pain,  Orthopnea, PND, swelling in lower extremities, anasarca, dizziness, palpitations, syncope.   GI  No heartburn, indigestion, abdominal pain, nausea, vomiting, diarrhea, change in bowel habits, loss of appetite, bloody  stools.   Resp:    No chest wall deformity  Skin: no rash or lesions.  GU: no dysuria, change in color of urine, no urgency or frequency.  No flank pain, no hematuria   MS:  No joint pain or swelling.  No decreased range of motion.  No back pain.    Physical Exam  BP 138/78 (BP Location: Right Arm, Cuff Size: Large)   Pulse 82   Ht 5\' 1"  (1.549 m)   Wt (!) 317 lb 8 oz (144 kg)   SpO2 92%   BMI 59.99 kg/m   GEN: A/Ox3; pleasant , NAD, obese in wc , cane.    HEENT:  [place]/AT,  EACs-clear, TMs-wnl, NOSE-clear, THROAT-clear, no lesions, no postnasal drip or exudate noted.   NECK:  Supple w/ fair ROM; no JVD; normal carotid impulses w/o bruits; no thyromegaly or nodules palpated; no lymphadenopathy.    RESP  Few trace wheezes , no  accessory muscle use, no dullness to percussion  CARD:  RRR, no m/r/g, tr  peripheral edema, pulses intact, no cyanosis or clubbing.  GI:   Soft & nt; nml bowel sounds; no organomegaly or masses detected.   Musco: Warm bil, no deformities or joint swelling noted.   Neuro: alert, no focal deficits noted.    Skin:  Warm, no lesions or rashes    Lab Results:  CBC  BNP    Component Value Date/Time   BNP 61.7 04/03/2017 1254    ProBNP    Component Value Date/Time   PROBNP 65.0 04/13/2010 0405    Imaging: Dg Chest 2 View  Result Date: 04/03/2017 CLINICAL DATA:  Cough and shortness of breath EXAM: CHEST  2 VIEW COMPARISON:  Chest x-ray of August 25, 2015 and chest CT scan of August 26, 2015. FINDINGS: The lungs are mildly hyperinflated. The interstitial markings are coarse. The cardiac silhouette is enlarged. The central pulmonary vascularity is engorged. There is no pleural effusion. There is mild multilevel degenerative disc disease of the thoracic spine. IMPRESSION: Mild compensated CHF.  No alveolar edema or pneumonia. Electronically Signed   By: David  Martinique M.D.   On: 04/03/2017 14:48     Assessment & Plan:   Obesity hypoventilation syndrome (HCC) OSA/OHS BIPAP download requested   Plan  Cont on BIPAP At bedtime  .    Chronic diastolic heart failure (HCC) Appears compensated  Cont on current regimen   Acute bronchitis Resolving  Hold on further abx and steroids  Add mucinex  Trial of Dulera in place of QVAR   Plan  Patient Instructions  Hold on QVAR  Begin Dulera 2 puffs Twice daily  , rinse after use.  Continue on BIPAP At bedtime  .  Continue on low salt diet  Continue on Torsemide and Aldactone.  Mucinex DM Twice daily  As needed  Cough /congestion  Follow up with Dr. Elsworth Soho  6-8 weeks and As needed          Rexene Edison, NP 04/10/2017

## 2017-04-10 NOTE — Assessment & Plan Note (Signed)
Resolving  Hold on further abx and steroids  Add mucinex  Trial of Dulera in place of QVAR   Plan  Patient Instructions  Hold on QVAR  Begin Dulera 2 puffs Twice daily  , rinse after use.  Continue on BIPAP At bedtime  .  Continue on low salt diet  Continue on Torsemide and Aldactone.  Mucinex DM Twice daily  As needed  Cough /congestion  Follow up with Dr. Elsworth Soho  6-8 weeks and As needed

## 2017-04-12 ENCOUNTER — Telehealth: Payer: Self-pay | Admitting: Adult Health

## 2017-04-12 DIAGNOSIS — I110 Hypertensive heart disease with heart failure: Secondary | ICD-10-CM | POA: Diagnosis not present

## 2017-04-12 DIAGNOSIS — I5022 Chronic systolic (congestive) heart failure: Secondary | ICD-10-CM | POA: Diagnosis not present

## 2017-04-12 DIAGNOSIS — L120 Bullous pemphigoid: Secondary | ICD-10-CM | POA: Diagnosis not present

## 2017-04-12 DIAGNOSIS — Z794 Long term (current) use of insulin: Secondary | ICD-10-CM | POA: Diagnosis not present

## 2017-04-12 DIAGNOSIS — D1809 Hemangioma of other sites: Secondary | ICD-10-CM | POA: Diagnosis not present

## 2017-04-12 DIAGNOSIS — F329 Major depressive disorder, single episode, unspecified: Secondary | ICD-10-CM | POA: Diagnosis not present

## 2017-04-12 DIAGNOSIS — G4733 Obstructive sleep apnea (adult) (pediatric): Secondary | ICD-10-CM

## 2017-04-12 DIAGNOSIS — Z7952 Long term (current) use of systemic steroids: Secondary | ICD-10-CM | POA: Diagnosis not present

## 2017-04-12 DIAGNOSIS — E662 Morbid (severe) obesity with alveolar hypoventilation: Secondary | ICD-10-CM | POA: Diagnosis not present

## 2017-04-12 DIAGNOSIS — Z7982 Long term (current) use of aspirin: Secondary | ICD-10-CM | POA: Diagnosis not present

## 2017-04-12 DIAGNOSIS — Z6841 Body Mass Index (BMI) 40.0 and over, adult: Secondary | ICD-10-CM | POA: Diagnosis not present

## 2017-04-12 DIAGNOSIS — E119 Type 2 diabetes mellitus without complications: Secondary | ICD-10-CM | POA: Diagnosis not present

## 2017-04-12 NOTE — Telephone Encounter (Signed)
I have received PA for [facility]. Will intiate on CMM. The insurance states it will take up to 72 hours for a response.

## 2017-04-12 NOTE — Telephone Encounter (Signed)
Pt wants to switch DME's from [facility] to [facility]? PCC's do I have to place a new order?

## 2017-04-12 NOTE — Telephone Encounter (Signed)
I have faxed everything that [facility] should need to help the patient switch from [facility] to [facility]

## 2017-04-15 ENCOUNTER — Telehealth: Payer: Self-pay | Admitting: Pulmonary Disease

## 2017-04-15 DIAGNOSIS — E119 Type 2 diabetes mellitus without complications: Secondary | ICD-10-CM | POA: Diagnosis not present

## 2017-04-15 DIAGNOSIS — Z6841 Body Mass Index (BMI) 40.0 and over, adult: Secondary | ICD-10-CM | POA: Diagnosis not present

## 2017-04-15 DIAGNOSIS — Z7952 Long term (current) use of systemic steroids: Secondary | ICD-10-CM | POA: Diagnosis not present

## 2017-04-15 DIAGNOSIS — I110 Hypertensive heart disease with heart failure: Secondary | ICD-10-CM | POA: Diagnosis not present

## 2017-04-15 DIAGNOSIS — I5022 Chronic systolic (congestive) heart failure: Secondary | ICD-10-CM | POA: Diagnosis not present

## 2017-04-15 DIAGNOSIS — Z7982 Long term (current) use of aspirin: Secondary | ICD-10-CM | POA: Diagnosis not present

## 2017-04-15 DIAGNOSIS — E662 Morbid (severe) obesity with alveolar hypoventilation: Secondary | ICD-10-CM | POA: Diagnosis not present

## 2017-04-15 DIAGNOSIS — G4733 Obstructive sleep apnea (adult) (pediatric): Secondary | ICD-10-CM | POA: Diagnosis not present

## 2017-04-15 DIAGNOSIS — Z794 Long term (current) use of insulin: Secondary | ICD-10-CM | POA: Diagnosis not present

## 2017-04-15 DIAGNOSIS — D1809 Hemangioma of other sites: Secondary | ICD-10-CM | POA: Diagnosis not present

## 2017-04-15 DIAGNOSIS — L120 Bullous pemphigoid: Secondary | ICD-10-CM | POA: Diagnosis not present

## 2017-04-15 DIAGNOSIS — F329 Major depressive disorder, single episode, unspecified: Secondary | ICD-10-CM | POA: Diagnosis not present

## 2017-04-15 NOTE — Telephone Encounter (Signed)
Spoke with Tharon Aquas. She was seeking a verbal order for the patient to have PT and a home health nurse to visit the patient. She tried to contact the patient's PCP but they wanted to come in to be seen first. It is hard for the patient to get in and out of house.   She wanted to know if RA would be ok signing off. Gave her verbal orders. Nothing else needed at time of call.

## 2017-04-16 ENCOUNTER — Telehealth: Payer: Self-pay | Admitting: Pulmonary Disease

## 2017-04-16 NOTE — Telephone Encounter (Signed)
I called and spoke with [place] from [facility].  He stated that the pt has met her ded from her insurance but with the [place] she has an out of pocket expense of $3700.  She would be billed each month until this was paid in full.  I have called to make the pt aware but I had to leave a message.  Waiting for the pt to call back.

## 2017-04-16 NOTE — Telephone Encounter (Signed)
Patient returned call, states someone called just a few minutes ago and she missed it.  CB is [phone].

## 2017-04-16 NOTE — Telephone Encounter (Signed)
PA for Ruthe Mannan has been approved through 04/12/2018.   Called [facility] pharmacy (pharmacy initiated PA process) to make aware of approval.  Nothing further needed.

## 2017-04-17 NOTE — Telephone Encounter (Addendum)
Spoke with pt, she states she is trying to get her bipap from [place] but didn't really explain to me what she needs from [place]. As far as the O2 dropping low, I offered her an OV but she declined. Spoke with Angie at [facility] and she is currently working on this now. She will call pt today. Nothing further is needed.

## 2017-04-17 NOTE — Telephone Encounter (Signed)
Patient is calling about her Bipap. There is an order to [facility] but she states she wakes up with her O2 at 83, she got it up to 89. She is worried about her O2 until they get her Bipap. CB is [phone].

## 2017-04-18 DIAGNOSIS — Z7982 Long term (current) use of aspirin: Secondary | ICD-10-CM | POA: Diagnosis not present

## 2017-04-18 DIAGNOSIS — G4733 Obstructive sleep apnea (adult) (pediatric): Secondary | ICD-10-CM | POA: Diagnosis not present

## 2017-04-18 DIAGNOSIS — D1809 Hemangioma of other sites: Secondary | ICD-10-CM | POA: Diagnosis not present

## 2017-04-18 DIAGNOSIS — I5022 Chronic systolic (congestive) heart failure: Secondary | ICD-10-CM | POA: Diagnosis not present

## 2017-04-18 DIAGNOSIS — Z6841 Body Mass Index (BMI) 40.0 and over, adult: Secondary | ICD-10-CM | POA: Diagnosis not present

## 2017-04-18 DIAGNOSIS — Z794 Long term (current) use of insulin: Secondary | ICD-10-CM | POA: Diagnosis not present

## 2017-04-18 DIAGNOSIS — Z7952 Long term (current) use of systemic steroids: Secondary | ICD-10-CM | POA: Diagnosis not present

## 2017-04-18 DIAGNOSIS — I110 Hypertensive heart disease with heart failure: Secondary | ICD-10-CM | POA: Diagnosis not present

## 2017-04-18 DIAGNOSIS — E119 Type 2 diabetes mellitus without complications: Secondary | ICD-10-CM | POA: Diagnosis not present

## 2017-04-18 DIAGNOSIS — L120 Bullous pemphigoid: Secondary | ICD-10-CM | POA: Diagnosis not present

## 2017-04-18 DIAGNOSIS — F329 Major depressive disorder, single episode, unspecified: Secondary | ICD-10-CM | POA: Diagnosis not present

## 2017-04-18 DIAGNOSIS — E662 Morbid (severe) obesity with alveolar hypoventilation: Secondary | ICD-10-CM | POA: Diagnosis not present

## 2017-04-19 DIAGNOSIS — D1809 Hemangioma of other sites: Secondary | ICD-10-CM | POA: Diagnosis not present

## 2017-04-19 DIAGNOSIS — Z794 Long term (current) use of insulin: Secondary | ICD-10-CM | POA: Diagnosis not present

## 2017-04-19 DIAGNOSIS — E119 Type 2 diabetes mellitus without complications: Secondary | ICD-10-CM | POA: Diagnosis not present

## 2017-04-19 DIAGNOSIS — Z6841 Body Mass Index (BMI) 40.0 and over, adult: Secondary | ICD-10-CM | POA: Diagnosis not present

## 2017-04-19 DIAGNOSIS — F329 Major depressive disorder, single episode, unspecified: Secondary | ICD-10-CM | POA: Diagnosis not present

## 2017-04-19 DIAGNOSIS — I110 Hypertensive heart disease with heart failure: Secondary | ICD-10-CM | POA: Diagnosis not present

## 2017-04-19 DIAGNOSIS — Z7982 Long term (current) use of aspirin: Secondary | ICD-10-CM | POA: Diagnosis not present

## 2017-04-19 DIAGNOSIS — I5022 Chronic systolic (congestive) heart failure: Secondary | ICD-10-CM | POA: Diagnosis not present

## 2017-04-19 DIAGNOSIS — Z7952 Long term (current) use of systemic steroids: Secondary | ICD-10-CM | POA: Diagnosis not present

## 2017-04-19 DIAGNOSIS — L120 Bullous pemphigoid: Secondary | ICD-10-CM | POA: Diagnosis not present

## 2017-04-19 DIAGNOSIS — G4733 Obstructive sleep apnea (adult) (pediatric): Secondary | ICD-10-CM | POA: Diagnosis not present

## 2017-04-19 DIAGNOSIS — E662 Morbid (severe) obesity with alveolar hypoventilation: Secondary | ICD-10-CM | POA: Diagnosis not present

## 2017-04-19 NOTE — Progress Notes (Signed)
Reviewed & agree with plan  

## 2017-04-22 DIAGNOSIS — E662 Morbid (severe) obesity with alveolar hypoventilation: Secondary | ICD-10-CM | POA: Diagnosis not present

## 2017-04-22 DIAGNOSIS — Z6841 Body Mass Index (BMI) 40.0 and over, adult: Secondary | ICD-10-CM | POA: Diagnosis not present

## 2017-04-22 DIAGNOSIS — I5022 Chronic systolic (congestive) heart failure: Secondary | ICD-10-CM | POA: Diagnosis not present

## 2017-04-22 DIAGNOSIS — F329 Major depressive disorder, single episode, unspecified: Secondary | ICD-10-CM | POA: Diagnosis not present

## 2017-04-22 DIAGNOSIS — Z794 Long term (current) use of insulin: Secondary | ICD-10-CM | POA: Diagnosis not present

## 2017-04-22 DIAGNOSIS — E119 Type 2 diabetes mellitus without complications: Secondary | ICD-10-CM | POA: Diagnosis not present

## 2017-04-22 DIAGNOSIS — L120 Bullous pemphigoid: Secondary | ICD-10-CM | POA: Diagnosis not present

## 2017-04-22 DIAGNOSIS — Z7982 Long term (current) use of aspirin: Secondary | ICD-10-CM | POA: Diagnosis not present

## 2017-04-22 DIAGNOSIS — Z7952 Long term (current) use of systemic steroids: Secondary | ICD-10-CM | POA: Diagnosis not present

## 2017-04-22 DIAGNOSIS — G4733 Obstructive sleep apnea (adult) (pediatric): Secondary | ICD-10-CM | POA: Diagnosis not present

## 2017-04-22 DIAGNOSIS — D1809 Hemangioma of other sites: Secondary | ICD-10-CM | POA: Diagnosis not present

## 2017-04-22 DIAGNOSIS — I110 Hypertensive heart disease with heart failure: Secondary | ICD-10-CM | POA: Diagnosis not present

## 2017-04-23 ENCOUNTER — Telehealth: Payer: Self-pay | Admitting: Pulmonary Disease

## 2017-04-23 DIAGNOSIS — D1809 Hemangioma of other sites: Secondary | ICD-10-CM | POA: Diagnosis not present

## 2017-04-23 DIAGNOSIS — F329 Major depressive disorder, single episode, unspecified: Secondary | ICD-10-CM | POA: Diagnosis not present

## 2017-04-23 DIAGNOSIS — G4733 Obstructive sleep apnea (adult) (pediatric): Secondary | ICD-10-CM | POA: Diagnosis not present

## 2017-04-23 DIAGNOSIS — Z6841 Body Mass Index (BMI) 40.0 and over, adult: Secondary | ICD-10-CM | POA: Diagnosis not present

## 2017-04-23 DIAGNOSIS — I5022 Chronic systolic (congestive) heart failure: Secondary | ICD-10-CM | POA: Diagnosis not present

## 2017-04-23 DIAGNOSIS — I110 Hypertensive heart disease with heart failure: Secondary | ICD-10-CM | POA: Diagnosis not present

## 2017-04-23 DIAGNOSIS — Z794 Long term (current) use of insulin: Secondary | ICD-10-CM | POA: Diagnosis not present

## 2017-04-23 DIAGNOSIS — Z7952 Long term (current) use of systemic steroids: Secondary | ICD-10-CM | POA: Diagnosis not present

## 2017-04-23 DIAGNOSIS — L120 Bullous pemphigoid: Secondary | ICD-10-CM | POA: Diagnosis not present

## 2017-04-23 DIAGNOSIS — E119 Type 2 diabetes mellitus without complications: Secondary | ICD-10-CM | POA: Diagnosis not present

## 2017-04-23 DIAGNOSIS — Z7982 Long term (current) use of aspirin: Secondary | ICD-10-CM | POA: Diagnosis not present

## 2017-04-23 DIAGNOSIS — E662 Morbid (severe) obesity with alveolar hypoventilation: Secondary | ICD-10-CM | POA: Diagnosis not present

## 2017-04-23 NOTE — Telephone Encounter (Signed)
Nina Howell has signed the form and I have faxed it to [facility]. Rec'd confirmation that the fax was received

## 2017-04-23 NOTE — Telephone Encounter (Signed)
I have called [facility] asking them to resend the CMN to my attn. I'm still waiting on the form. They stated that it was faxed last Thursday and I don't have it.

## 2017-04-23 NOTE — Telephone Encounter (Signed)
Pt states she needs help with [facility] and getting her BiPAP set up. Spoke with DME-stated they needed CMN signed and faxed over here today to Anita's attention.   Rodena Piety has CMN now and willk have TP sign forms and then fax back. Pt is aware and nothing more needed at this time.

## 2017-04-24 DIAGNOSIS — I110 Hypertensive heart disease with heart failure: Secondary | ICD-10-CM | POA: Diagnosis not present

## 2017-04-25 ENCOUNTER — Telehealth: Payer: Self-pay

## 2017-04-25 DIAGNOSIS — L120 Bullous pemphigoid: Secondary | ICD-10-CM | POA: Diagnosis not present

## 2017-04-25 DIAGNOSIS — Z7982 Long term (current) use of aspirin: Secondary | ICD-10-CM | POA: Diagnosis not present

## 2017-04-25 DIAGNOSIS — E119 Type 2 diabetes mellitus without complications: Secondary | ICD-10-CM | POA: Diagnosis not present

## 2017-04-25 DIAGNOSIS — D1809 Hemangioma of other sites: Secondary | ICD-10-CM | POA: Diagnosis not present

## 2017-04-25 DIAGNOSIS — G4733 Obstructive sleep apnea (adult) (pediatric): Secondary | ICD-10-CM | POA: Diagnosis not present

## 2017-04-25 DIAGNOSIS — Z7952 Long term (current) use of systemic steroids: Secondary | ICD-10-CM | POA: Diagnosis not present

## 2017-04-25 DIAGNOSIS — I5022 Chronic systolic (congestive) heart failure: Secondary | ICD-10-CM | POA: Diagnosis not present

## 2017-04-25 DIAGNOSIS — I110 Hypertensive heart disease with heart failure: Secondary | ICD-10-CM | POA: Diagnosis not present

## 2017-04-25 DIAGNOSIS — Z794 Long term (current) use of insulin: Secondary | ICD-10-CM | POA: Diagnosis not present

## 2017-04-25 DIAGNOSIS — F329 Major depressive disorder, single episode, unspecified: Secondary | ICD-10-CM | POA: Diagnosis not present

## 2017-04-25 DIAGNOSIS — E662 Morbid (severe) obesity with alveolar hypoventilation: Secondary | ICD-10-CM | POA: Diagnosis not present

## 2017-04-25 DIAGNOSIS — Z6841 Body Mass Index (BMI) 40.0 and over, adult: Secondary | ICD-10-CM | POA: Diagnosis not present

## 2017-04-25 NOTE — Telephone Encounter (Signed)
Called and spoke with [facility] and she stated that the pt has been staying aroun 88% most of the time.  She stated that [facility] was to send her a new cpap but have not at this time due to ?dx code that was needed.   I called [facility] and asked about the pts cpap order and they stated that the pt is on the phone with the manager at this time and they placed me on hold.  Maudie Mercury came back to the phone and stated that they are setting her up for Wednesday for her cpap.

## 2017-04-26 DIAGNOSIS — Z794 Long term (current) use of insulin: Secondary | ICD-10-CM | POA: Diagnosis not present

## 2017-04-26 DIAGNOSIS — G4733 Obstructive sleep apnea (adult) (pediatric): Secondary | ICD-10-CM | POA: Diagnosis not present

## 2017-04-26 DIAGNOSIS — L120 Bullous pemphigoid: Secondary | ICD-10-CM | POA: Diagnosis not present

## 2017-04-26 DIAGNOSIS — D1809 Hemangioma of other sites: Secondary | ICD-10-CM | POA: Diagnosis not present

## 2017-04-26 DIAGNOSIS — F329 Major depressive disorder, single episode, unspecified: Secondary | ICD-10-CM | POA: Diagnosis not present

## 2017-04-26 DIAGNOSIS — E119 Type 2 diabetes mellitus without complications: Secondary | ICD-10-CM | POA: Diagnosis not present

## 2017-04-26 DIAGNOSIS — Z7982 Long term (current) use of aspirin: Secondary | ICD-10-CM | POA: Diagnosis not present

## 2017-04-26 DIAGNOSIS — I110 Hypertensive heart disease with heart failure: Secondary | ICD-10-CM | POA: Diagnosis not present

## 2017-04-26 DIAGNOSIS — Z7952 Long term (current) use of systemic steroids: Secondary | ICD-10-CM | POA: Diagnosis not present

## 2017-04-26 DIAGNOSIS — E662 Morbid (severe) obesity with alveolar hypoventilation: Secondary | ICD-10-CM | POA: Diagnosis not present

## 2017-04-26 DIAGNOSIS — Z6841 Body Mass Index (BMI) 40.0 and over, adult: Secondary | ICD-10-CM | POA: Diagnosis not present

## 2017-04-26 DIAGNOSIS — I5022 Chronic systolic (congestive) heart failure: Secondary | ICD-10-CM | POA: Diagnosis not present

## 2017-04-30 DIAGNOSIS — F329 Major depressive disorder, single episode, unspecified: Secondary | ICD-10-CM | POA: Diagnosis not present

## 2017-04-30 DIAGNOSIS — E662 Morbid (severe) obesity with alveolar hypoventilation: Secondary | ICD-10-CM | POA: Diagnosis not present

## 2017-04-30 DIAGNOSIS — I110 Hypertensive heart disease with heart failure: Secondary | ICD-10-CM | POA: Diagnosis not present

## 2017-04-30 DIAGNOSIS — G4733 Obstructive sleep apnea (adult) (pediatric): Secondary | ICD-10-CM | POA: Diagnosis not present

## 2017-04-30 DIAGNOSIS — Z794 Long term (current) use of insulin: Secondary | ICD-10-CM | POA: Diagnosis not present

## 2017-04-30 DIAGNOSIS — D1809 Hemangioma of other sites: Secondary | ICD-10-CM | POA: Diagnosis not present

## 2017-04-30 DIAGNOSIS — I5022 Chronic systolic (congestive) heart failure: Secondary | ICD-10-CM | POA: Diagnosis not present

## 2017-04-30 DIAGNOSIS — Z7982 Long term (current) use of aspirin: Secondary | ICD-10-CM | POA: Diagnosis not present

## 2017-04-30 DIAGNOSIS — E119 Type 2 diabetes mellitus without complications: Secondary | ICD-10-CM | POA: Diagnosis not present

## 2017-04-30 DIAGNOSIS — L120 Bullous pemphigoid: Secondary | ICD-10-CM | POA: Diagnosis not present

## 2017-04-30 DIAGNOSIS — Z7952 Long term (current) use of systemic steroids: Secondary | ICD-10-CM | POA: Diagnosis not present

## 2017-04-30 DIAGNOSIS — Z6841 Body Mass Index (BMI) 40.0 and over, adult: Secondary | ICD-10-CM | POA: Diagnosis not present

## 2017-05-01 DIAGNOSIS — D1809 Hemangioma of other sites: Secondary | ICD-10-CM | POA: Diagnosis not present

## 2017-05-01 DIAGNOSIS — F329 Major depressive disorder, single episode, unspecified: Secondary | ICD-10-CM | POA: Diagnosis not present

## 2017-05-01 DIAGNOSIS — I5022 Chronic systolic (congestive) heart failure: Secondary | ICD-10-CM | POA: Diagnosis not present

## 2017-05-01 DIAGNOSIS — E662 Morbid (severe) obesity with alveolar hypoventilation: Secondary | ICD-10-CM | POA: Diagnosis not present

## 2017-05-01 DIAGNOSIS — G4733 Obstructive sleep apnea (adult) (pediatric): Secondary | ICD-10-CM | POA: Diagnosis not present

## 2017-05-01 DIAGNOSIS — Z7952 Long term (current) use of systemic steroids: Secondary | ICD-10-CM | POA: Diagnosis not present

## 2017-05-01 DIAGNOSIS — Z6841 Body Mass Index (BMI) 40.0 and over, adult: Secondary | ICD-10-CM | POA: Diagnosis not present

## 2017-05-01 DIAGNOSIS — E119 Type 2 diabetes mellitus without complications: Secondary | ICD-10-CM | POA: Diagnosis not present

## 2017-05-01 DIAGNOSIS — Z794 Long term (current) use of insulin: Secondary | ICD-10-CM | POA: Diagnosis not present

## 2017-05-01 DIAGNOSIS — L120 Bullous pemphigoid: Secondary | ICD-10-CM | POA: Diagnosis not present

## 2017-05-01 DIAGNOSIS — I110 Hypertensive heart disease with heart failure: Secondary | ICD-10-CM | POA: Diagnosis not present

## 2017-05-01 DIAGNOSIS — Z7982 Long term (current) use of aspirin: Secondary | ICD-10-CM | POA: Diagnosis not present

## 2017-05-03 DIAGNOSIS — I110 Hypertensive heart disease with heart failure: Secondary | ICD-10-CM | POA: Diagnosis not present

## 2017-05-03 DIAGNOSIS — E662 Morbid (severe) obesity with alveolar hypoventilation: Secondary | ICD-10-CM | POA: Diagnosis not present

## 2017-05-03 DIAGNOSIS — Z6841 Body Mass Index (BMI) 40.0 and over, adult: Secondary | ICD-10-CM | POA: Diagnosis not present

## 2017-05-03 DIAGNOSIS — E119 Type 2 diabetes mellitus without complications: Secondary | ICD-10-CM | POA: Diagnosis not present

## 2017-05-03 DIAGNOSIS — L120 Bullous pemphigoid: Secondary | ICD-10-CM | POA: Diagnosis not present

## 2017-05-03 DIAGNOSIS — Z794 Long term (current) use of insulin: Secondary | ICD-10-CM | POA: Diagnosis not present

## 2017-05-03 DIAGNOSIS — G4733 Obstructive sleep apnea (adult) (pediatric): Secondary | ICD-10-CM | POA: Diagnosis not present

## 2017-05-03 DIAGNOSIS — I5022 Chronic systolic (congestive) heart failure: Secondary | ICD-10-CM | POA: Diagnosis not present

## 2017-05-03 DIAGNOSIS — D1809 Hemangioma of other sites: Secondary | ICD-10-CM | POA: Diagnosis not present

## 2017-05-03 DIAGNOSIS — Z7982 Long term (current) use of aspirin: Secondary | ICD-10-CM | POA: Diagnosis not present

## 2017-05-03 DIAGNOSIS — L218 Other seborrheic dermatitis: Secondary | ICD-10-CM | POA: Diagnosis not present

## 2017-05-03 DIAGNOSIS — Z7952 Long term (current) use of systemic steroids: Secondary | ICD-10-CM | POA: Diagnosis not present

## 2017-05-03 DIAGNOSIS — L97121 Non-pressure chronic ulcer of left thigh limited to breakdown of skin: Secondary | ICD-10-CM | POA: Diagnosis not present

## 2017-05-03 DIAGNOSIS — F329 Major depressive disorder, single episode, unspecified: Secondary | ICD-10-CM | POA: Diagnosis not present

## 2017-05-06 ENCOUNTER — Telehealth: Payer: Self-pay | Admitting: Pulmonary Disease

## 2017-05-06 DIAGNOSIS — Z6841 Body Mass Index (BMI) 40.0 and over, adult: Secondary | ICD-10-CM | POA: Diagnosis not present

## 2017-05-06 DIAGNOSIS — G4733 Obstructive sleep apnea (adult) (pediatric): Secondary | ICD-10-CM | POA: Diagnosis not present

## 2017-05-06 DIAGNOSIS — I5022 Chronic systolic (congestive) heart failure: Secondary | ICD-10-CM | POA: Diagnosis not present

## 2017-05-06 DIAGNOSIS — Z794 Long term (current) use of insulin: Secondary | ICD-10-CM | POA: Diagnosis not present

## 2017-05-06 DIAGNOSIS — D1809 Hemangioma of other sites: Secondary | ICD-10-CM | POA: Diagnosis not present

## 2017-05-06 DIAGNOSIS — E119 Type 2 diabetes mellitus without complications: Secondary | ICD-10-CM | POA: Diagnosis not present

## 2017-05-06 DIAGNOSIS — Z7952 Long term (current) use of systemic steroids: Secondary | ICD-10-CM | POA: Diagnosis not present

## 2017-05-06 DIAGNOSIS — L120 Bullous pemphigoid: Secondary | ICD-10-CM | POA: Diagnosis not present

## 2017-05-06 DIAGNOSIS — Z7982 Long term (current) use of aspirin: Secondary | ICD-10-CM | POA: Diagnosis not present

## 2017-05-06 DIAGNOSIS — F329 Major depressive disorder, single episode, unspecified: Secondary | ICD-10-CM | POA: Diagnosis not present

## 2017-05-06 DIAGNOSIS — I110 Hypertensive heart disease with heart failure: Secondary | ICD-10-CM | POA: Diagnosis not present

## 2017-05-06 DIAGNOSIS — E662 Morbid (severe) obesity with alveolar hypoventilation: Secondary | ICD-10-CM | POA: Diagnosis not present

## 2017-05-06 NOTE — Telephone Encounter (Signed)
lmtcb x1 for [place] with [name].  lmtcb x1 for pt to gather additional info regarding spo2 levels.

## 2017-05-06 NOTE — Telephone Encounter (Signed)
Spoke with pt, states that her O2 will drop down to the 80's on occasion but states that this is improving since starting bipap.  Pt read me several readings, the lowest reading being 88% with exertion.  Pt denies any current breathing complaints, states she is grateful for call but states that she is not requesting any follow up at this time.  Will close encounter.

## 2017-05-06 NOTE — Telephone Encounter (Signed)
Patient returned, CB is [phone].

## 2017-05-07 DIAGNOSIS — I5022 Chronic systolic (congestive) heart failure: Secondary | ICD-10-CM | POA: Diagnosis not present

## 2017-05-07 DIAGNOSIS — E119 Type 2 diabetes mellitus without complications: Secondary | ICD-10-CM | POA: Diagnosis not present

## 2017-05-07 DIAGNOSIS — Z7982 Long term (current) use of aspirin: Secondary | ICD-10-CM | POA: Diagnosis not present

## 2017-05-07 DIAGNOSIS — L120 Bullous pemphigoid: Secondary | ICD-10-CM | POA: Diagnosis not present

## 2017-05-07 DIAGNOSIS — E662 Morbid (severe) obesity with alveolar hypoventilation: Secondary | ICD-10-CM | POA: Diagnosis not present

## 2017-05-07 DIAGNOSIS — Z794 Long term (current) use of insulin: Secondary | ICD-10-CM | POA: Diagnosis not present

## 2017-05-07 DIAGNOSIS — D1809 Hemangioma of other sites: Secondary | ICD-10-CM | POA: Diagnosis not present

## 2017-05-07 DIAGNOSIS — I110 Hypertensive heart disease with heart failure: Secondary | ICD-10-CM | POA: Diagnosis not present

## 2017-05-07 DIAGNOSIS — F329 Major depressive disorder, single episode, unspecified: Secondary | ICD-10-CM | POA: Diagnosis not present

## 2017-05-07 DIAGNOSIS — G4733 Obstructive sleep apnea (adult) (pediatric): Secondary | ICD-10-CM | POA: Diagnosis not present

## 2017-05-07 DIAGNOSIS — Z6841 Body Mass Index (BMI) 40.0 and over, adult: Secondary | ICD-10-CM | POA: Diagnosis not present

## 2017-05-07 DIAGNOSIS — Z7952 Long term (current) use of systemic steroids: Secondary | ICD-10-CM | POA: Diagnosis not present

## 2017-05-08 DIAGNOSIS — M1990 Unspecified osteoarthritis, unspecified site: Secondary | ICD-10-CM | POA: Diagnosis not present

## 2017-05-08 DIAGNOSIS — E1122 Type 2 diabetes mellitus with diabetic chronic kidney disease: Secondary | ICD-10-CM | POA: Diagnosis not present

## 2017-05-08 DIAGNOSIS — R2681 Unsteadiness on feet: Secondary | ICD-10-CM | POA: Diagnosis not present

## 2017-05-08 DIAGNOSIS — Z0000 Encounter for general adult medical examination without abnormal findings: Secondary | ICD-10-CM | POA: Diagnosis not present

## 2017-05-08 DIAGNOSIS — E785 Hyperlipidemia, unspecified: Secondary | ICD-10-CM | POA: Diagnosis not present

## 2017-05-08 DIAGNOSIS — Z23 Encounter for immunization: Secondary | ICD-10-CM | POA: Diagnosis not present

## 2017-05-08 DIAGNOSIS — N8502 Endometrial intraepithelial neoplasia [EIN]: Secondary | ICD-10-CM | POA: Diagnosis not present

## 2017-05-08 DIAGNOSIS — Z6841 Body Mass Index (BMI) 40.0 and over, adult: Secondary | ICD-10-CM | POA: Diagnosis not present

## 2017-05-08 DIAGNOSIS — I10 Essential (primary) hypertension: Secondary | ICD-10-CM | POA: Diagnosis not present

## 2017-05-08 DIAGNOSIS — Z1231 Encounter for screening mammogram for malignant neoplasm of breast: Secondary | ICD-10-CM | POA: Diagnosis not present

## 2017-05-08 DIAGNOSIS — R262 Difficulty in walking, not elsewhere classified: Secondary | ICD-10-CM | POA: Diagnosis not present

## 2017-05-10 DIAGNOSIS — Z7952 Long term (current) use of systemic steroids: Secondary | ICD-10-CM | POA: Diagnosis not present

## 2017-05-10 DIAGNOSIS — D1809 Hemangioma of other sites: Secondary | ICD-10-CM | POA: Diagnosis not present

## 2017-05-10 DIAGNOSIS — E662 Morbid (severe) obesity with alveolar hypoventilation: Secondary | ICD-10-CM | POA: Diagnosis not present

## 2017-05-10 DIAGNOSIS — G4733 Obstructive sleep apnea (adult) (pediatric): Secondary | ICD-10-CM | POA: Diagnosis not present

## 2017-05-10 DIAGNOSIS — I110 Hypertensive heart disease with heart failure: Secondary | ICD-10-CM | POA: Diagnosis not present

## 2017-05-10 DIAGNOSIS — F329 Major depressive disorder, single episode, unspecified: Secondary | ICD-10-CM | POA: Diagnosis not present

## 2017-05-10 DIAGNOSIS — I5022 Chronic systolic (congestive) heart failure: Secondary | ICD-10-CM | POA: Diagnosis not present

## 2017-05-10 DIAGNOSIS — Z7982 Long term (current) use of aspirin: Secondary | ICD-10-CM | POA: Diagnosis not present

## 2017-05-10 DIAGNOSIS — Z6841 Body Mass Index (BMI) 40.0 and over, adult: Secondary | ICD-10-CM | POA: Diagnosis not present

## 2017-05-10 DIAGNOSIS — E119 Type 2 diabetes mellitus without complications: Secondary | ICD-10-CM | POA: Diagnosis not present

## 2017-05-10 DIAGNOSIS — Z794 Long term (current) use of insulin: Secondary | ICD-10-CM | POA: Diagnosis not present

## 2017-05-10 DIAGNOSIS — L120 Bullous pemphigoid: Secondary | ICD-10-CM | POA: Diagnosis not present

## 2017-05-13 DIAGNOSIS — E119 Type 2 diabetes mellitus without complications: Secondary | ICD-10-CM | POA: Diagnosis not present

## 2017-05-13 DIAGNOSIS — Z7952 Long term (current) use of systemic steroids: Secondary | ICD-10-CM | POA: Diagnosis not present

## 2017-05-13 DIAGNOSIS — I110 Hypertensive heart disease with heart failure: Secondary | ICD-10-CM | POA: Diagnosis not present

## 2017-05-13 DIAGNOSIS — D1809 Hemangioma of other sites: Secondary | ICD-10-CM | POA: Diagnosis not present

## 2017-05-13 DIAGNOSIS — L120 Bullous pemphigoid: Secondary | ICD-10-CM | POA: Diagnosis not present

## 2017-05-13 DIAGNOSIS — E662 Morbid (severe) obesity with alveolar hypoventilation: Secondary | ICD-10-CM | POA: Diagnosis not present

## 2017-05-13 DIAGNOSIS — Z7982 Long term (current) use of aspirin: Secondary | ICD-10-CM | POA: Diagnosis not present

## 2017-05-13 DIAGNOSIS — Z6841 Body Mass Index (BMI) 40.0 and over, adult: Secondary | ICD-10-CM | POA: Diagnosis not present

## 2017-05-13 DIAGNOSIS — F329 Major depressive disorder, single episode, unspecified: Secondary | ICD-10-CM | POA: Diagnosis not present

## 2017-05-13 DIAGNOSIS — G4733 Obstructive sleep apnea (adult) (pediatric): Secondary | ICD-10-CM | POA: Diagnosis not present

## 2017-05-13 DIAGNOSIS — I5022 Chronic systolic (congestive) heart failure: Secondary | ICD-10-CM | POA: Diagnosis not present

## 2017-05-13 DIAGNOSIS — Z794 Long term (current) use of insulin: Secondary | ICD-10-CM | POA: Diagnosis not present

## 2017-05-18 ENCOUNTER — Telehealth: Payer: Self-pay | Admitting: Pulmonary Disease

## 2017-05-18 NOTE — Telephone Encounter (Signed)
Patient apparently received a call from [facility] saying she needed to put a credit card on file to have home oxygen.  She didn't understand where this was coming from.  She states her SpO2 hasn't gotten below 89% recently.    I advised her that I would route this information to Dr. Elsworth Soho and our office staff will f/u with her next week.

## 2017-05-22 DIAGNOSIS — L120 Bullous pemphigoid: Secondary | ICD-10-CM | POA: Diagnosis not present

## 2017-05-22 DIAGNOSIS — I5022 Chronic systolic (congestive) heart failure: Secondary | ICD-10-CM | POA: Diagnosis not present

## 2017-05-22 DIAGNOSIS — D1809 Hemangioma of other sites: Secondary | ICD-10-CM | POA: Diagnosis not present

## 2017-05-22 DIAGNOSIS — G4733 Obstructive sleep apnea (adult) (pediatric): Secondary | ICD-10-CM | POA: Diagnosis not present

## 2017-05-22 DIAGNOSIS — Z7982 Long term (current) use of aspirin: Secondary | ICD-10-CM | POA: Diagnosis not present

## 2017-05-22 DIAGNOSIS — I110 Hypertensive heart disease with heart failure: Secondary | ICD-10-CM | POA: Diagnosis not present

## 2017-05-22 DIAGNOSIS — E662 Morbid (severe) obesity with alveolar hypoventilation: Secondary | ICD-10-CM | POA: Diagnosis not present

## 2017-05-22 DIAGNOSIS — F329 Major depressive disorder, single episode, unspecified: Secondary | ICD-10-CM | POA: Diagnosis not present

## 2017-05-22 DIAGNOSIS — Z794 Long term (current) use of insulin: Secondary | ICD-10-CM | POA: Diagnosis not present

## 2017-05-22 DIAGNOSIS — Z7952 Long term (current) use of systemic steroids: Secondary | ICD-10-CM | POA: Diagnosis not present

## 2017-05-22 DIAGNOSIS — E119 Type 2 diabetes mellitus without complications: Secondary | ICD-10-CM | POA: Diagnosis not present

## 2017-05-22 DIAGNOSIS — Z6841 Body Mass Index (BMI) 40.0 and over, adult: Secondary | ICD-10-CM | POA: Diagnosis not present

## 2017-05-22 NOTE — Telephone Encounter (Signed)
Please find out what this means from [facility]

## 2017-05-22 NOTE — Telephone Encounter (Signed)
Spoke with [place] at [facility]. He stated that it is company policy for them to have a credit card on file to cover any copays for O2. Even with insurance, a card is needed to be on file. He stated that per the patient's chart, this has been discussed with her several times including her speaking with management on the issue.   Left message for patient to call back.

## 2017-05-23 ENCOUNTER — Telehealth: Payer: Self-pay | Admitting: Pulmonary Disease

## 2017-05-23 NOTE — Telephone Encounter (Signed)
Spoke with pt, she states she would like a sample of Dulera. The sample will be left up front and pt notified.

## 2017-05-27 DIAGNOSIS — G4733 Obstructive sleep apnea (adult) (pediatric): Secondary | ICD-10-CM | POA: Diagnosis not present

## 2017-05-29 ENCOUNTER — Ambulatory Visit: Payer: BLUE CROSS/BLUE SHIELD | Admitting: Pulmonary Disease

## 2017-05-29 ENCOUNTER — Telehealth: Payer: Self-pay | Admitting: Pulmonary Disease

## 2017-05-29 DIAGNOSIS — Z6841 Body Mass Index (BMI) 40.0 and over, adult: Secondary | ICD-10-CM | POA: Diagnosis not present

## 2017-05-29 DIAGNOSIS — I509 Heart failure, unspecified: Secondary | ICD-10-CM | POA: Diagnosis not present

## 2017-05-29 DIAGNOSIS — L120 Bullous pemphigoid: Secondary | ICD-10-CM | POA: Diagnosis not present

## 2017-05-29 DIAGNOSIS — I5022 Chronic systolic (congestive) heart failure: Secondary | ICD-10-CM | POA: Diagnosis not present

## 2017-05-29 DIAGNOSIS — J449 Chronic obstructive pulmonary disease, unspecified: Secondary | ICD-10-CM | POA: Diagnosis not present

## 2017-05-29 DIAGNOSIS — D1809 Hemangioma of other sites: Secondary | ICD-10-CM | POA: Diagnosis not present

## 2017-05-29 DIAGNOSIS — Z7952 Long term (current) use of systemic steroids: Secondary | ICD-10-CM | POA: Diagnosis not present

## 2017-05-29 DIAGNOSIS — E119 Type 2 diabetes mellitus without complications: Secondary | ICD-10-CM | POA: Diagnosis not present

## 2017-05-29 DIAGNOSIS — F329 Major depressive disorder, single episode, unspecified: Secondary | ICD-10-CM | POA: Diagnosis not present

## 2017-05-29 DIAGNOSIS — Z794 Long term (current) use of insulin: Secondary | ICD-10-CM | POA: Diagnosis not present

## 2017-05-29 DIAGNOSIS — G4733 Obstructive sleep apnea (adult) (pediatric): Secondary | ICD-10-CM | POA: Diagnosis not present

## 2017-05-29 DIAGNOSIS — E662 Morbid (severe) obesity with alveolar hypoventilation: Secondary | ICD-10-CM | POA: Diagnosis not present

## 2017-05-29 DIAGNOSIS — I110 Hypertensive heart disease with heart failure: Secondary | ICD-10-CM | POA: Diagnosis not present

## 2017-05-29 DIAGNOSIS — Z7982 Long term (current) use of aspirin: Secondary | ICD-10-CM | POA: Diagnosis not present

## 2017-05-29 NOTE — Telephone Encounter (Signed)
Spoke with Fraser Din. She is a case Freight forwarder from [employer] who monitors the patient's vitals from home. She stated that the patient's O2 levels have dropping down into the lower 80s. Fraser Din stated that she would fax a 2 month report to the office for RA to review.   Patient was supposed to be discharged from the hospital with O2. Explained to Fraser Din that we received a call from [facility] earlier this year stating that the patient was refusing the O2 due to having to have a credit card on file. Multiple messages were left for her and she only called back to request samples of Dulera.   Beecher Mcardle that she has an appt next week with RA.   Left message for patient to see how she is doing.

## 2017-05-30 NOTE — Telephone Encounter (Signed)
lmtcb X2 for pt.  

## 2017-05-31 NOTE — Telephone Encounter (Signed)
Spoke with pt. States that she is doing okay, considering. Advised her that we are always here for her if she needs [place]. She verbalized understanding and was appreciative of my call. Nothing further was needed.

## 2017-06-05 ENCOUNTER — Ambulatory Visit (INDEPENDENT_AMBULATORY_CARE_PROVIDER_SITE_OTHER): Payer: BLUE CROSS/BLUE SHIELD | Admitting: Pulmonary Disease

## 2017-06-05 ENCOUNTER — Encounter: Payer: Self-pay | Admitting: Pulmonary Disease

## 2017-06-05 DIAGNOSIS — J9611 Chronic respiratory failure with hypoxia: Secondary | ICD-10-CM | POA: Diagnosis not present

## 2017-06-05 DIAGNOSIS — E662 Morbid (severe) obesity with alveolar hypoventilation: Secondary | ICD-10-CM | POA: Diagnosis not present

## 2017-06-05 NOTE — Addendum Note (Signed)
Addended by: Valerie Salts on: 06/05/2017 09:57 AM   Modules accepted: Orders

## 2017-06-05 NOTE — Patient Instructions (Signed)
Prescription for Brio will be sent into pharmacy-take this once daily after  you are done with [facility]  we will send prescription to [facility] for oxygen. We will check Settings and your BiPAP machine

## 2017-06-05 NOTE — Assessment & Plan Note (Signed)
Prescription for Brio will be sent into pharmacy-take this once daily after  you are done with [facility]   We will check Settings and your BiPAP machine

## 2017-06-05 NOTE — Assessment & Plan Note (Signed)
we will send prescription to [facility] for oxygen.

## 2017-06-05 NOTE — Progress Notes (Signed)
   Subjective:    Patient ID: Nina Howell, female    DOB: [birth year], 77 y.o.   MRN: [id number]  HPI  76/F Never smoker morbidly obese for FU of obstructive sleep apnea/ OHS .  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 She has a home health nurse to check on her oxygen level and this has been noted to be low at home  Oxygen prescription was sent by PCP, unfortunately she has not received this yet, this seems to be because she did not want to leave a quit card on file with DME[facility]. She would rather get her oxygen from the same company[facility] that she has a BiPAP from. She has been complaining of nausea, sugars have been dropping and insulin is being titrated down by endocrinologist.  She is compliant with her BiPAP machine Dulera is very expensive and she would like to change to different medication She is compliant with diuretics denies pedal edema or orthopnea      Significant tests/ events  08/2015:2-D echo shows severely dilated RV with severely reduced RV systolic function(new-not seen on prior Echo) PA pressure of 69 ANA 1: 160 speckled, other autoimmune workup negative   Adm 03/2010 ABG 7.43/63/89/97% on 2 L, discharged on 2 L &Bilevel 14/8  PSG 12/2011showed moderate obstructive sleep apnea with AHi 15/h.    Review of Systems neg for any significant sore throat, dysphagia, itching, sneezing, nasal congestion or excess/ purulent secretions, fever, chills, sweats, unintended wt loss, pleuritic or exertional cp, hempoptysis, orthopnea pnd or change in chronic leg swelling. Also denies presyncope, palpitations, heartburn, abdominal pain, nausea, vomiting, diarrhea or change in bowel or urinary  habits, dysuria,hematuria, rash, arthralgias, visual complaints, headache, numbness weakness or ataxia.     Objective:   Physical Exam   Gen. Pleasant, obese, in no distress ENT - no lesions, no post nasal drip Neck: No JVD, no thyromegaly, no carotid bruits Lungs: no use of accessory muscles, no dullness to percussion, decreased without rales or rhonchi  Cardiovascular: Rhythm regular, heart sounds  normal, no murmurs or gallops, no peripheral edema Musculoskeletal: No deformities, no cyanosis or clubbing , no tremors        Assessment & Plan:

## 2017-06-07 ENCOUNTER — Telehealth: Payer: Self-pay | Admitting: Pulmonary Disease

## 2017-06-07 DIAGNOSIS — Z7982 Long term (current) use of aspirin: Secondary | ICD-10-CM | POA: Diagnosis not present

## 2017-06-07 DIAGNOSIS — E662 Morbid (severe) obesity with alveolar hypoventilation: Secondary | ICD-10-CM | POA: Diagnosis not present

## 2017-06-07 DIAGNOSIS — G4733 Obstructive sleep apnea (adult) (pediatric): Secondary | ICD-10-CM | POA: Diagnosis not present

## 2017-06-07 DIAGNOSIS — I5040 Unspecified combined systolic (congestive) and diastolic (congestive) heart failure: Secondary | ICD-10-CM | POA: Diagnosis not present

## 2017-06-07 DIAGNOSIS — F329 Major depressive disorder, single episode, unspecified: Secondary | ICD-10-CM | POA: Diagnosis not present

## 2017-06-07 DIAGNOSIS — L120 Bullous pemphigoid: Secondary | ICD-10-CM | POA: Diagnosis not present

## 2017-06-07 DIAGNOSIS — I5022 Chronic systolic (congestive) heart failure: Secondary | ICD-10-CM | POA: Diagnosis not present

## 2017-06-07 DIAGNOSIS — Z6841 Body Mass Index (BMI) 40.0 and over, adult: Secondary | ICD-10-CM | POA: Diagnosis not present

## 2017-06-07 DIAGNOSIS — Z7952 Long term (current) use of systemic steroids: Secondary | ICD-10-CM | POA: Diagnosis not present

## 2017-06-07 DIAGNOSIS — I110 Hypertensive heart disease with heart failure: Secondary | ICD-10-CM | POA: Diagnosis not present

## 2017-06-07 DIAGNOSIS — Z794 Long term (current) use of insulin: Secondary | ICD-10-CM | POA: Diagnosis not present

## 2017-06-07 DIAGNOSIS — E119 Type 2 diabetes mellitus without complications: Secondary | ICD-10-CM | POA: Diagnosis not present

## 2017-06-07 DIAGNOSIS — D1809 Hemangioma of other sites: Secondary | ICD-10-CM | POA: Diagnosis not present

## 2017-06-07 NOTE — Telephone Encounter (Signed)
Advised Nina Howell to fax to the number Prien provided.

## 2017-06-07 NOTE — Telephone Encounter (Signed)
Ok thank you, I will call them to make sure it was received.

## 2017-06-07 NOTE — Telephone Encounter (Signed)
General 06/05/2017 11:22 AM Ilona Sorrel - -  Note   Confirmation received from fax sent to [facility].        Type Date User Summary Attachment  General 06/05/2017 10:02 AM Ilona Sorrel     Spoke with pt, she states [facility] has not received an order from RA. She spoke with a lady named Angie at [facility]. Can we re-fax order to them? PCC's please advise.

## 2017-06-07 NOTE — Telephone Encounter (Signed)
Reprinted order and refaxed it sent to [facility]

## 2017-06-07 NOTE — Telephone Encounter (Signed)
I called [facility] to check on if they received the order. She stated she did not receive anything on the 16th or now.    [phone] [place]

## 2017-06-07 NOTE — Telephone Encounter (Signed)
Got message from answering service that O2 is not delivered yet and sats are 85% Called home health nurse [phone]) and patient [phone]) to clarify issue but got voice mail on both. Left message requesting a call back  Triage- can you follow up on this? Thanks  Marshell Garfinkel MD [place] Pager [phone] 06/07/2017, 8:38 AM

## 2017-06-07 NOTE — Telephone Encounter (Signed)
Noted  

## 2017-06-07 NOTE — Telephone Encounter (Signed)
Pl ensure that O2 order has been sent to diff DME - pt did not want to go woth [facility]

## 2017-06-10 ENCOUNTER — Telehealth: Payer: Self-pay | Admitting: Pulmonary Disease

## 2017-06-10 DIAGNOSIS — Z6841 Body Mass Index (BMI) 40.0 and over, adult: Secondary | ICD-10-CM | POA: Diagnosis not present

## 2017-06-10 DIAGNOSIS — Z7952 Long term (current) use of systemic steroids: Secondary | ICD-10-CM | POA: Diagnosis not present

## 2017-06-10 DIAGNOSIS — I5022 Chronic systolic (congestive) heart failure: Secondary | ICD-10-CM | POA: Diagnosis not present

## 2017-06-10 DIAGNOSIS — L120 Bullous pemphigoid: Secondary | ICD-10-CM | POA: Diagnosis not present

## 2017-06-10 DIAGNOSIS — E119 Type 2 diabetes mellitus without complications: Secondary | ICD-10-CM | POA: Diagnosis not present

## 2017-06-10 DIAGNOSIS — D1809 Hemangioma of other sites: Secondary | ICD-10-CM | POA: Diagnosis not present

## 2017-06-10 DIAGNOSIS — E662 Morbid (severe) obesity with alveolar hypoventilation: Secondary | ICD-10-CM | POA: Diagnosis not present

## 2017-06-10 DIAGNOSIS — F329 Major depressive disorder, single episode, unspecified: Secondary | ICD-10-CM | POA: Diagnosis not present

## 2017-06-10 DIAGNOSIS — I110 Hypertensive heart disease with heart failure: Secondary | ICD-10-CM | POA: Diagnosis not present

## 2017-06-10 DIAGNOSIS — Z7982 Long term (current) use of aspirin: Secondary | ICD-10-CM | POA: Diagnosis not present

## 2017-06-10 DIAGNOSIS — G4733 Obstructive sleep apnea (adult) (pediatric): Secondary | ICD-10-CM | POA: Diagnosis not present

## 2017-06-10 DIAGNOSIS — R069 Unspecified abnormalities of breathing: Secondary | ICD-10-CM | POA: Diagnosis not present

## 2017-06-10 DIAGNOSIS — Z794 Long term (current) use of insulin: Secondary | ICD-10-CM | POA: Diagnosis not present

## 2017-06-10 NOTE — Telephone Encounter (Signed)
Spoke with [employer] She stated that 2L is not working for the patient. She needs to be on 4L in order to keep her in the mid 90s. She just wanted to let RA know.  Will route to RA so he is aware.

## 2017-06-10 NOTE — Telephone Encounter (Signed)
Pt is aware of below recommendations and voiced her understanding.  Pt states she did call EMS, who stated that her oxygen hose had a kink in it. Per pt spO2 was 93% on 3L when checked by EMS and EKG was normal. Pt states she will continue to monitor oxygen levels and will contact us if levels drop again.   Will route to RA as a FYI.

## 2017-06-10 NOTE — Telephone Encounter (Signed)
OK 

## 2017-06-10 NOTE — Telephone Encounter (Signed)
Spoke with patient regarding low O2 Sats Patient woke up and O2 sats are 80% on 2L of O2, used BIPAP last night. States slept well on BIPAP machine with O2 bleed at 2 liters. Pt reports not breathing well, struggles to talk with me over phone call today,  pt cannot get out of bed this morning due to fatigue, SOB, and prod cough-yellow  Advised patient to turn O2 to 3 liters while on phone to get sats up to 90%; pt was at rest with 2 liters O2 at 82% on 3 liters of O2 for over 6 mins was at 90% when pt talking on the phone, dropped to 86% at 3 liters of O2  Advised pt to come in office today for appt with MW or SG; she denied appt Advised pt to call EMS to go to ED due to SOB, struggling to breath on the phone, fatigued, and sats dropping at 86% on 3 liters of O2. Pt suppose to be on 2 liters O2 con't normally. Pt advised she is requesting message to be sent to RA for review and she is calling EMS once we hang up on phone call   RA please advise.

## 2017-06-10 NOTE — Telephone Encounter (Signed)
Agree with advice. Office visit or ED evaluation depending on how sick she feels

## 2017-06-21 ENCOUNTER — Telehealth: Payer: Self-pay | Admitting: Pulmonary Disease

## 2017-06-21 DIAGNOSIS — L120 Bullous pemphigoid: Secondary | ICD-10-CM | POA: Diagnosis not present

## 2017-06-21 DIAGNOSIS — G4733 Obstructive sleep apnea (adult) (pediatric): Secondary | ICD-10-CM | POA: Diagnosis not present

## 2017-06-21 DIAGNOSIS — D1809 Hemangioma of other sites: Secondary | ICD-10-CM | POA: Diagnosis not present

## 2017-06-21 DIAGNOSIS — E662 Morbid (severe) obesity with alveolar hypoventilation: Secondary | ICD-10-CM | POA: Diagnosis not present

## 2017-06-21 DIAGNOSIS — Z6841 Body Mass Index (BMI) 40.0 and over, adult: Secondary | ICD-10-CM | POA: Diagnosis not present

## 2017-06-21 DIAGNOSIS — F329 Major depressive disorder, single episode, unspecified: Secondary | ICD-10-CM | POA: Diagnosis not present

## 2017-06-21 DIAGNOSIS — I5022 Chronic systolic (congestive) heart failure: Secondary | ICD-10-CM | POA: Diagnosis not present

## 2017-06-21 DIAGNOSIS — Z7952 Long term (current) use of systemic steroids: Secondary | ICD-10-CM | POA: Diagnosis not present

## 2017-06-21 DIAGNOSIS — Z794 Long term (current) use of insulin: Secondary | ICD-10-CM | POA: Diagnosis not present

## 2017-06-21 DIAGNOSIS — I110 Hypertensive heart disease with heart failure: Secondary | ICD-10-CM | POA: Diagnosis not present

## 2017-06-21 DIAGNOSIS — E119 Type 2 diabetes mellitus without complications: Secondary | ICD-10-CM | POA: Diagnosis not present

## 2017-06-21 DIAGNOSIS — Z7982 Long term (current) use of aspirin: Secondary | ICD-10-CM | POA: Diagnosis not present

## 2017-06-21 MED ORDER — AZITHROMYCIN 250 MG PO TABS: ORAL_TABLET | ORAL | 0 refills | Status: DC

## 2017-06-21 MED ORDER — PREDNISONE 10 MG PO TABS: ORAL_TABLET | ORAL | 0 refills | Status: DC

## 2017-06-21 NOTE — Telephone Encounter (Signed)
With green mucus and wheezing sounds like bronchitis Please give her Z-Pak and Prednisone 10 mg tabs  Take 2 tabs daily with food x 5ds, then 1 tab daily with food x 5ds then STOP    She needs an office visit to evaluate Please arrange with NP next available

## 2017-06-21 NOTE — Telephone Encounter (Signed)
Pt returning call. Pt hung up before a Cb number could be received.

## 2017-06-21 NOTE — Telephone Encounter (Signed)
Called and spoke with patient regarding O2 % Pt's nurse with [facility]; Cory; states pt is 92% on 4 Liters.  Pt was having problems breathing at night she is 79%. On bipap machine and the 5 liters o2 connected to it. Pt reports having prod cough- green thick mucus, SOB, and wheezing Suppose to be at 2 liters of O2 at rest and exertion, but uses O2 at 4 liters of O2 to get to 90%   RA please advise

## 2017-06-21 NOTE — Telephone Encounter (Signed)
Patient calling and left CB number [phone].  She states she is having trouble with O2 and is anxious for call back.

## 2017-06-21 NOTE — Telephone Encounter (Signed)
Called pt and advised message from the provider. Pt understood and verbalized understanding. Nothing further is needed.   Rx sent to pharmacy.  

## 2017-06-21 NOTE — Telephone Encounter (Signed)
Spoke with Tommi Rumps, she states the pt is supposed to be on 2L and she has to put her oxygen up to 4 to get to 91%. At night she uses a Bipap with 4L connected to Bipap and she states she is unable to breathe with saturations of 79% when she checks it with her pulse oximeter.  She is also coughing up thick green mucus. I tried to call pt but she did not answer. Will route to RA to advise.   ATC pt, no answer. Left message for pt to call back.

## 2017-06-27 DIAGNOSIS — G4733 Obstructive sleep apnea (adult) (pediatric): Secondary | ICD-10-CM | POA: Diagnosis not present

## 2017-06-28 DIAGNOSIS — E662 Morbid (severe) obesity with alveolar hypoventilation: Secondary | ICD-10-CM | POA: Diagnosis not present

## 2017-06-28 DIAGNOSIS — I110 Hypertensive heart disease with heart failure: Secondary | ICD-10-CM | POA: Diagnosis not present

## 2017-06-28 DIAGNOSIS — Z7952 Long term (current) use of systemic steroids: Secondary | ICD-10-CM | POA: Diagnosis not present

## 2017-06-28 DIAGNOSIS — F329 Major depressive disorder, single episode, unspecified: Secondary | ICD-10-CM | POA: Diagnosis not present

## 2017-06-28 DIAGNOSIS — E119 Type 2 diabetes mellitus without complications: Secondary | ICD-10-CM | POA: Diagnosis not present

## 2017-06-28 DIAGNOSIS — L120 Bullous pemphigoid: Secondary | ICD-10-CM | POA: Diagnosis not present

## 2017-06-28 DIAGNOSIS — D1809 Hemangioma of other sites: Secondary | ICD-10-CM | POA: Diagnosis not present

## 2017-06-28 DIAGNOSIS — G4733 Obstructive sleep apnea (adult) (pediatric): Secondary | ICD-10-CM | POA: Diagnosis not present

## 2017-06-28 DIAGNOSIS — Z6841 Body Mass Index (BMI) 40.0 and over, adult: Secondary | ICD-10-CM | POA: Diagnosis not present

## 2017-06-28 DIAGNOSIS — I5022 Chronic systolic (congestive) heart failure: Secondary | ICD-10-CM | POA: Diagnosis not present

## 2017-06-28 DIAGNOSIS — Z7982 Long term (current) use of aspirin: Secondary | ICD-10-CM | POA: Diagnosis not present

## 2017-06-28 DIAGNOSIS — Z794 Long term (current) use of insulin: Secondary | ICD-10-CM | POA: Diagnosis not present

## 2017-07-03 DIAGNOSIS — Z794 Long term (current) use of insulin: Secondary | ICD-10-CM | POA: Diagnosis not present

## 2017-07-03 DIAGNOSIS — E039 Hypothyroidism, unspecified: Secondary | ICD-10-CM | POA: Diagnosis not present

## 2017-07-03 DIAGNOSIS — E1142 Type 2 diabetes mellitus with diabetic polyneuropathy: Secondary | ICD-10-CM | POA: Diagnosis not present

## 2017-07-04 DIAGNOSIS — D1809 Hemangioma of other sites: Secondary | ICD-10-CM | POA: Diagnosis not present

## 2017-07-04 DIAGNOSIS — Z794 Long term (current) use of insulin: Secondary | ICD-10-CM | POA: Diagnosis not present

## 2017-07-04 DIAGNOSIS — L120 Bullous pemphigoid: Secondary | ICD-10-CM | POA: Diagnosis not present

## 2017-07-04 DIAGNOSIS — F329 Major depressive disorder, single episode, unspecified: Secondary | ICD-10-CM | POA: Diagnosis not present

## 2017-07-04 DIAGNOSIS — Z7982 Long term (current) use of aspirin: Secondary | ICD-10-CM | POA: Diagnosis not present

## 2017-07-04 DIAGNOSIS — I110 Hypertensive heart disease with heart failure: Secondary | ICD-10-CM | POA: Diagnosis not present

## 2017-07-04 DIAGNOSIS — I5022 Chronic systolic (congestive) heart failure: Secondary | ICD-10-CM | POA: Diagnosis not present

## 2017-07-04 DIAGNOSIS — Z7952 Long term (current) use of systemic steroids: Secondary | ICD-10-CM | POA: Diagnosis not present

## 2017-07-04 DIAGNOSIS — G4733 Obstructive sleep apnea (adult) (pediatric): Secondary | ICD-10-CM | POA: Diagnosis not present

## 2017-07-04 DIAGNOSIS — E662 Morbid (severe) obesity with alveolar hypoventilation: Secondary | ICD-10-CM | POA: Diagnosis not present

## 2017-07-04 DIAGNOSIS — E119 Type 2 diabetes mellitus without complications: Secondary | ICD-10-CM | POA: Diagnosis not present

## 2017-07-04 DIAGNOSIS — Z6841 Body Mass Index (BMI) 40.0 and over, adult: Secondary | ICD-10-CM | POA: Diagnosis not present

## 2017-07-08 DIAGNOSIS — I5040 Unspecified combined systolic (congestive) and diastolic (congestive) heart failure: Secondary | ICD-10-CM | POA: Diagnosis not present

## 2017-07-18 ENCOUNTER — Telehealth: Payer: Self-pay | Admitting: Pulmonary Disease

## 2017-07-18 MED ORDER — OSELTAMIVIR PHOSPHATE 75 MG PO CAPS: 75.0000 mg | ORAL_CAPSULE | Freq: Two times a day (BID) | ORAL | 0 refills | Status: DC

## 2017-07-18 NOTE — Telephone Encounter (Signed)
Called pt who stated the frequent stools have been bad for about 1-1.5 weeks and she began experiencing a headache yesterday, 07/17/17.  Pt's husband went to the [place] in [place] this morning and found out he was diagnosed with the flu.  Pt is coughing up green mucus.  Pt denies any fever.  Dr. Elsworth Soho, please advise on recommendations for pt. Thanks!

## 2017-07-18 NOTE — Telephone Encounter (Signed)
Spoke with patient. She is aware of RA's recs. Will go ahead and call in Tamiflu. Nothing else needed at time of call.

## 2017-07-18 NOTE — Telephone Encounter (Signed)
Tamiflu 50 mg twice daily for 5 days as prophylaxis

## 2017-07-19 ENCOUNTER — Telehealth: Payer: Self-pay | Admitting: Pulmonary Disease

## 2017-07-19 NOTE — Telephone Encounter (Signed)
Spoke with pt, she states she would like a Rx for cough. She states the cough is terrible and she just cannot take it. RZ can we send something in for pt? Please advise.  Current Outpatient Medications on File Prior to Visit  Medication Sig Dispense Refill  . acetaminophen (TYLENOL) 500 MG tablet Take 1,000 mg every 8 (eight) hours as needed by mouth for mild pain, moderate pain, fever or headache.    . albuterol (PROVENTIL HFA;VENTOLIN HFA) 108 (90 Base) MCG/ACT inhaler Inhale 2 puffs into the lungs every 4 (four) hours as needed for wheezing or shortness of breath. 1 Inhaler 3  . aspirin 81 MG tablet Take 81 mg at bedtime by mouth.     Marland Kitchen atorvastatin (LIPITOR) 20 MG tablet Take 20 mg by mouth every other day.    Marland Kitchen azithromycin (ZITHROMAX) 250 MG tablet Take 2 pills today then one a day for 4 additional days 6 tablet 0  . Cholecalciferol (VITAMIN D) 2000 units tablet Take 2,000 Units daily by mouth.    . Cranberry 400 MG TABS Take 1,600 mg daily by mouth.    . Cyanocobalamin (VITAMIN B-12) 2500 MCG SUBL Place 1 tablet under the tongue daily.    Marland Kitchen guaiFENesin (MUCINEX) 600 MG 12 hr tablet Take 1,200 mg 2 (two) times daily by mouth. Started 10/30    . insulin NPH-regular Human (NOVOLIN 70/30) (70-30) 100 UNIT/ML injection Inject 2 Units into the skin daily with supper. Takes 30 UNITS IN AM and 8 UNITS IN PM    . levothyroxine (SYNTHROID, LEVOTHROID) 50 MCG tablet Take 50 mcg by mouth daily.      Marland Kitchen lidocaine (XYLOCAINE) 5 % ointment Apply 1 application 2 (two) times daily topically.     . Liraglutide (VICTOZA) 18 MG/3ML SOPN Inject 1.8 mg into the skin daily.    . metFORMIN (GLUCOPHAGE) 1000 MG tablet Take 1,000 mg by mouth 2 (two) times daily with a meal.    . mometasone-formoterol (DULERA) 200-5 MCG/ACT AERO Inhale 2 puffs into the lungs 2 (two) times daily. 1 Inhaler 0  . Multiple Vitamin (MULTIVITAMIN WITH MINERALS) TABS tablet Take 0.5 tablets by mouth daily.    . niacinamide 500 MG tablet Take  500 mg by mouth 2 (two) times daily with a meal.    . oseltamivir (TAMIFLU) 75 MG capsule Take 1 capsule (75 mg total) by mouth 2 (two) times daily. 10 capsule 0  . polyethylene glycol (MIRALAX / GLYCOLAX) packet Take 17 g by mouth daily as needed. (Patient taking differently: Take 17 g daily as needed by mouth for mild constipation. ) 14 each 0  . potassium chloride SA (K-DUR,KLOR-CON) 20 MEQ tablet TAKE 2 TABLETS BY MOUTH  EVERY MORNING. TAKE AN  ADDITIONAL TABLET IF YOU  TAKE EXTRA TORSEMIDE 270 tablet 3  . predniSONE (DELTASONE) 10 MG tablet Take 2 tabs daily with food x 5ds, then 1 tab daily with food x 5ds then STOP 15 tablet 0  . sertraline (ZOLOFT) 50 MG tablet Take 100 mg by mouth at bedtime.     Marland Kitchen spironolactone (ALDACTONE) 25 MG tablet TAKE 1 TABLET BY MOUTH  DAILY 90 tablet 3  . torsemide (DEMADEX) 20 MG tablet Take 2 tablets (40 mg total) by mouth 2 (two) times daily. 28 tablet 0   No current facility-administered medications on file prior to visit.    Allergies  Allergen Reactions  . Celebrex [Celecoxib] Swelling  . Penicillins Rash    Has patient had a  PCN reaction causing immediate rash, facial/tongue/throat swelling, SOB or lightheadedness with hypotension: Yes Has patient had a PCN reaction causing severe rash involving mucus membranes or skin necrosis: Yes Has patient had a PCN reaction that required hospitalization: No Has patient had a PCN reaction occurring within the last 10 years: Yes - Okay taking other cillin's If all of the above answers are "NO", then may proceed with Cephalosporin use.   . Cephalexin Hives  . Lasix [Furosemide] Hives  . Oxycodone-Acetaminophen Nausea Only  . Cephalosporins Rash  . Oxycodone Anxiety    Felt weird

## 2017-07-19 NOTE — Telephone Encounter (Signed)
Delsym 5 mL twice daily Over-the-counter. Anything stronger would have codeine in it and I really do not want her to have that

## 2017-07-19 NOTE — Telephone Encounter (Signed)
Called pt and advised message from the provider. Pt understood and verbalized understanding. Nothing further is needed.    

## 2017-07-28 ENCOUNTER — Other Ambulatory Visit: Payer: Self-pay | Admitting: Pulmonary Disease

## 2017-07-28 ENCOUNTER — Telehealth: Payer: Self-pay | Admitting: Pulmonary Disease

## 2017-07-28 NOTE — Telephone Encounter (Signed)
Called because she is having excessive mucus, dry heaves, unable to keep any thing down. No fevers, chills.  Currently on levaquin from friday, called in by her primary physician. Was previously on tamiflu recently for prophylaxis Asked her to add muicnex 600 mg bid. Will call in zofran prn.   Marshell Garfinkel MD [place] Pager [phone] 07/28/2017, 4:14 PM

## 2017-07-29 NOTE — Telephone Encounter (Signed)
Spoke with patient. She stated that she received the medication that Dr. Vaughan Browner, called in [place] and it is working for her. Nothing else needed at time of call.

## 2017-07-29 NOTE — Telephone Encounter (Signed)
Pt is calling back [phone]

## 2017-08-02 DIAGNOSIS — G4733 Obstructive sleep apnea (adult) (pediatric): Secondary | ICD-10-CM | POA: Diagnosis not present

## 2017-08-05 ENCOUNTER — Ambulatory Visit: Payer: BLUE CROSS/BLUE SHIELD | Admitting: Adult Health

## 2017-08-05 DIAGNOSIS — I5040 Unspecified combined systolic (congestive) and diastolic (congestive) heart failure: Secondary | ICD-10-CM | POA: Diagnosis not present

## 2017-08-15 ENCOUNTER — Telehealth: Payer: Self-pay | Admitting: Pulmonary Disease

## 2017-08-15 MED ORDER — FLUTICASONE FUROATE-VILANTEROL 200-25 MCG/INH IN AEPB: 1.0000 | INHALATION_SPRAY | Freq: Every day | RESPIRATORY_TRACT | 3 refills | Status: AC

## 2017-08-15 NOTE — Telephone Encounter (Signed)
Patient is needing the prescription of the breo. Per last OV note this has been sent in. Nothing further needed.

## 2017-08-16 ENCOUNTER — Other Ambulatory Visit: Payer: Self-pay | Admitting: Pulmonary Disease

## 2017-08-16 ENCOUNTER — Telehealth: Payer: Self-pay | Admitting: Pulmonary Disease

## 2017-08-16 NOTE — Telephone Encounter (Signed)
Tried calling patient back, unable to reach left message.  Patient was prescribed zofran in the past but not by Dr. Elsworth Soho.   Dr. Elsworth Soho are you ok with sending the refill of zofran in or should patient defer to PCP

## 2017-08-16 NOTE — Telephone Encounter (Signed)
Called and spoke with patient, advised patient that we have sent a message over to Dr. Elsworth Soho about the zofran and awaiting his response.   I advised patient that Dr. Elsworth Soho has not prescribed this is the past for her so I would have to get the ok from him before I can send in the prescription. Patient states that we are "treating her like she is stupid" for not giving her the medication. I apologized to the patient and stated that once Dr. Elsworth Soho got back with me I would contact her. I could not send in a prescription without the Dr. Madaline Brilliant.   RA please advise on this, can we send zofran in for the patient or does she need to go to PCP for this. Patient states she dry heaves in the mornings.

## 2017-08-19 ENCOUNTER — Other Ambulatory Visit: Payer: Self-pay | Admitting: Pulmonary Disease

## 2017-08-19 ENCOUNTER — Telehealth: Payer: Self-pay | Admitting: Pulmonary Disease

## 2017-08-19 MED ORDER — ALBUTEROL SULFATE HFA 108 (90 BASE) MCG/ACT IN AERS: 2.0000 | INHALATION_SPRAY | RESPIRATORY_TRACT | 3 refills | Status: DC | PRN

## 2017-08-19 NOTE — Telephone Encounter (Signed)
Called and spoke with pt who was requesting a new Rx of her ProAir Inhaler to be sent in. Stated to pt I would take care of sending refill in for her.  While speaking with pt she stated she had called our office 3/29 requesting a script of Zofran to be sent in.  Pt stated to me she called her PCP based on her symptoms and the doctor changed the amount of insulin. Pt states the dose has been changed from 30 units in the morning to 20 units in the morning. Pt states she used to be taking 8 units at night and is now currently not on any insulin at night.  Pt states with the change in insulin, the dry heaving has stopped and currently does not need an Rx for Zofran.  Pt stated to me if she felt like she did need a Rx, she would call our office to let us know.  Sending to RA as an [place].

## 2017-08-19 NOTE — Telephone Encounter (Signed)
See phone note from 08/19/17.

## 2017-08-19 NOTE — Telephone Encounter (Signed)
Okay to refill? 

## 2017-08-19 NOTE — Telephone Encounter (Signed)
Okay to refill [employer]. Defer Zofran to PCP

## 2017-08-19 NOTE — Telephone Encounter (Signed)
Addressed in diff phone note

## 2017-08-19 NOTE — Telephone Encounter (Signed)
Noted  

## 2017-08-26 ENCOUNTER — Ambulatory Visit (INDEPENDENT_AMBULATORY_CARE_PROVIDER_SITE_OTHER): Payer: BLUE CROSS/BLUE SHIELD | Admitting: Adult Health

## 2017-08-26 ENCOUNTER — Encounter: Payer: Self-pay | Admitting: Adult Health

## 2017-08-26 ENCOUNTER — Telehealth: Payer: Self-pay | Admitting: Pulmonary Disease

## 2017-08-26 DIAGNOSIS — G4733 Obstructive sleep apnea (adult) (pediatric): Secondary | ICD-10-CM

## 2017-08-26 DIAGNOSIS — I5042 Chronic combined systolic (congestive) and diastolic (congestive) heart failure: Secondary | ICD-10-CM | POA: Diagnosis not present

## 2017-08-26 DIAGNOSIS — J9611 Chronic respiratory failure with hypoxia: Secondary | ICD-10-CM | POA: Diagnosis not present

## 2017-08-26 MED ORDER — FLUTICASONE FUROATE-VILANTEROL 200-25 MCG/INH IN AEPB: 1.0000 | INHALATION_SPRAY | Freq: Every day | RESPIRATORY_TRACT | 0 refills | Status: DC

## 2017-08-26 NOTE — Assessment & Plan Note (Signed)
Continue on BiPAP at bedtime well-controlled and great compliance  Plan  Patient Instructions  Continue on BREO daily . Rinse after use.  Continue on BIPAP At bedtime  With oxygen 5lm .  Continue on Oxygen 3l/m rest , 4l/m walking  Follow up with Dr. Elsworth Soho  In 3-4 months and As needed

## 2017-08-26 NOTE — Telephone Encounter (Signed)
Will route this message to [place] for follow up.

## 2017-08-26 NOTE — Progress Notes (Signed)
@Patient  ID: Nina Howell, female    DOB: [birth year], 77 y.o.   MRN: [id number]  No chief complaint on file.   Referring provider: Leeroy Cha,*  HPI: 77 yo female never smoker followed for OSA/OHS , Chronic bronchitis and O2 Respiratory Failure  PMH significant for CHF (EF 30%-2018) , bullous pemphigoid on steroid intermittently   Significant tests/ events  08/2015:2-D echo shows severely dilated RV with severely reduced RV systolic function(new-not seen on prior Echo) PA pressure of 69 ANA 1: 160 speckled, other autoimmune workup negative   Adm 03/2010 ABG 7.43/63/89/97% on 2 L, discharged on 2 L &Bilevel 14/8  PSG 12/2011showed moderate obstructive sleep apnea with AHi 15/h.    08/26/2017 Follow up : OSA/OHS , chronic bronchitis O2 RF  Pt presents for a 3 month follow up . Says she is doing well on BIPAP for her OSA/OHS.  She feels rested with no significant daytime sleepiness.  Download shows excellent compliance with average usage at 8 hours.  Patient is on BiPAP auto max IPAP 25 minimum EPAP 4 and pressure support 4 cm H2O  On Oxygen 3l/m rest and 4l/m activity . On BIPAP At bedtime  With 5l/m .  Feels oxygen helps her with her shortness of breath  Has chronic bronchitis on BREO .  She does not understand how to use her inhaler.  We went over patient education for inhaler use.  Patient has congestive heart failure, systolic and but diastolic.  Says her lower extremity swelling has been controlled on Demadex and Aldactone.   Allergies  Allergen Reactions  . Celebrex [Celecoxib] Swelling  . Penicillins Rash    Has patient had a PCN reaction causing immediate rash, facial/tongue/throat swelling, SOB or lightheadedness with hypotension: Yes Has patient had a PCN reaction causing severe rash involving mucus membranes or skin necrosis: Yes Has patient had a PCN reaction that required hospitalization: No Has patient had a PCN reaction occurring within  the last 10 years: Yes - Okay taking other cillin's If all of the above answers are "NO", then may proceed with Cephalosporin use.   . Cephalexin Hives  . Lasix [Furosemide] Hives  . Oxycodone-Acetaminophen Nausea Only  . Cephalosporins Rash  . Oxycodone Anxiety    Felt weird    Immunization History  Administered Date(s) Administered  . Influenza Split 03/22/2011  . Influenza Whole 01/23/2010  . Influenza, High Dose Seasonal PF 02/22/2016, 02/18/2017  . Influenza-Unspecified 04/10/2015  . Pneumococcal Conjugate-13 03/05/2014  . Pneumococcal Polysaccharide-23 08/11/2012    Past Medical History:  Diagnosis Date  . Acute pancreatitis   . Anemia   . Asymptomatic cholelithiasis   . Bullous pemphigoid   . Cellulitis    ABDOMINAL WALL  . CHF (congestive heart failure) (HCC)    DECOMPENSATED SYSTOLIC CHF  . CHF (congestive heart failure) ([place])   . CTS (carpal tunnel syndrome)   . Diabetes mellitus, type 2 ([place])   . DJD (degenerative joint disease)   . Hepatic lesion   . Hyperlipidemia   . 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 Hypertension   . Hypokalemia   . Hypothyroidism   . Hypoventilation   . IUD (intrauterine device) in place   . Liver cirrhosis ([place])   . Morbid obesity ([place])   . 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 Sleep apnea    cpap  . Varicose veins     Tobacco History:  Social History   Tobacco Use  Smoking Status Never Smoker  Smokeless Tobacco Never Used   Counseling given: Not Answered   Outpatient Encounter Medications as of 08/26/2017  Medication Sig  . acetaminophen (TYLENOL) 500 MG tablet Take 1,000 mg every 8 (eight) hours as needed by mouth for mild pain, moderate pain, fever or headache.  Marland Kitchen aspirin 81 MG tablet Take 81 mg at bedtime by mouth.   Marland Kitchen atorvastatin (LIPITOR) 20 MG tablet Take 20 mg by mouth every other day.  . Cholecalciferol (VITAMIN D) 2000  units tablet Take 2,000 Units daily by mouth.  . Cranberry 400 MG TABS Take 1,600 mg daily by mouth.  . Cyanocobalamin (VITAMIN B-12) 2500 MCG SUBL Place 1 tablet under the tongue daily.  . fluticasone furoate-vilanterol (BREO ELLIPTA) 200-25 MCG/INH AEPB Inhale 1 puff into the lungs daily.  Marland Kitchen guaiFENesin (MUCINEX) 600 MG 12 hr tablet Take 1,200 mg 2 (two) times daily by mouth. Started 10/30  . insulin NPH-regular Human (NOVOLIN 70/30) (70-30) 100 UNIT/ML injection Inject 2 Units into the skin daily with supper. Takes 20 UNITS IN AM  . levothyroxine (SYNTHROID, LEVOTHROID) 50 MCG tablet Take 50 mcg by mouth daily.    . Liraglutide (VICTOZA) 18 MG/3ML SOPN Inject 1.8 mg into the skin daily.  . metFORMIN (GLUCOPHAGE) 1000 MG tablet Take 1,000 mg by mouth 2 (two) times daily with a meal.  . Multiple Vitamin (MULTIVITAMIN WITH MINERALS) TABS tablet Take 0.5 tablets by mouth daily.  . niacinamide 500 MG tablet Take 500 mg by mouth 2 (two) times daily with a meal.  . polyethylene glycol (MIRALAX / GLYCOLAX) packet Take 17 g by mouth daily as needed. (Patient taking differently: Take 17 g daily as needed by mouth for mild constipation. )  . potassium chloride SA (K-DUR,KLOR-CON) 20 MEQ tablet TAKE 2 TABLETS BY MOUTH  EVERY MORNING. TAKE AN  ADDITIONAL TABLET IF YOU  TAKE EXTRA TORSEMIDE  . PROAIR HFA 108 (90 Base) MCG/ACT inhaler INHALE TWO PUFFS BY MOUTH EVERY 4 HOURS AS NEEDED FOR WHEEZING OR FOR SHORTNESS OF BREATH  . sertraline (ZOLOFT) 50 MG tablet Take 100 mg by mouth at bedtime.   Marland Kitchen spironolactone (ALDACTONE) 25 MG tablet TAKE 1 TABLET BY MOUTH  DAILY  . torsemide (DEMADEX) 20 MG tablet Take 2 tablets (40 mg total) by mouth 2 (two) times daily.  Marland Kitchen lidocaine (XYLOCAINE) 5 % ointment Apply 1 application 2 (two) times daily topically.   . [DISCONTINUED] albuterol (PROVENTIL HFA;VENTOLIN HFA) 108 (90 Base) MCG/ACT inhaler Inhale 2 puffs into the lungs every 4 (four) hours as needed for wheezing or  shortness of breath.  . [DISCONTINUED] azithromycin (ZITHROMAX) 250 MG tablet Take 2 pills today then one a day for 4 additional days  . [DISCONTINUED] mometasone-formoterol (DULERA) 200-5 MCG/ACT AERO Inhale 2 puffs into the lungs 2 (two) times daily.  . [DISCONTINUED] oseltamivir (TAMIFLU) 75 MG capsule Take 1 capsule (75 mg total) by mouth 2 (two) times daily.  . [DISCONTINUED] predniSONE (DELTASONE) 10 MG tablet Take 2 tabs daily with food x 5ds, then 1 tab daily with food x 5ds then STOP   No facility-administered encounter medications on file as of 08/26/2017.      Review of Systems  Constitutional:   No  weight loss, night sweats,  Fevers, chills,  +fatigue, or  lassitude.  HEENT:   No headaches,  Difficulty swallowing,  Tooth/dental problems, or  Sore throat,  No sneezing, itching, ear ache, nasal congestion, post nasal drip,   CV:  No chest pain,  Orthopnea, PND, +swelling in lower extremities,  No anasarca, dizziness, palpitations, syncope.   GI  No heartburn, indigestion, abdominal pain, nausea, vomiting, diarrhea, change in bowel habits, loss of appetite, bloody stools. +constipation   Resp: t.  No excess mucus, no productive cough,  No non-productive cough,  No coughing up of blood.  No change in color of mucus.  No wheezing.  No chest wall deformity  Skin: no rash or lesions.  GU: no dysuria, change in color of urine, no urgency or frequency.  No flank pain, no hematuria   MS:  No joint pain or swelling.  No decreased range of motion.  No back pain.    Physical Exam  BP 124/78 (BP Location: Right Wrist, Cuff Size: Normal)   Pulse 98   Ht 5\' 1"  (1.549 m)   Wt 289 lb (131.1 kg)   SpO2 91%   BMI 54.61 kg/m   GEN: A/Ox3; pleasant , NAD, obese ,  In wc on O2    HEENT:  [place]/AT,  EACs-clear, TMs-wnl, NOSE-clear, THROAT-clear, no lesions, no postnasal drip or exudate noted.   NECK:  Supple w/ fair ROM; no JVD; normal carotid impulses w/o bruits; no  thyromegaly or nodules palpated; no lymphadenopathy.    RESP  Clear  P & A; w/o, wheezes/ rales/ or rhonchi. no accessory muscle use, no dullness to percussion  CARD:  RRR, no m/r/g, tr  peripheral edema, pulses intact, no cyanosis or clubbing.  GI:   Soft & nt; nml bowel sounds; no organomegaly or masses detected.   Musco: Warm bil, no deformities or joint swelling noted.   Neuro: alert, no focal deficits noted.    Skin: Warm, no lesions or rashes    Lab Results:   Imaging: No results found.   Assessment & Plan:   Congestive heart failure (HCC) Compensated on present regimen No changes  Obstructive sleep apnea Continue on BiPAP at bedtime well-controlled and great compliance  Plan  Patient Instructions  Continue on BREO daily . Rinse after use.  Continue on BIPAP At bedtime  With oxygen 5lm .  Continue on Oxygen 3l/m rest , 4l/m walking  Follow up with Dr. Elsworth Soho  In 3-4 months and As needed       Chronic respiratory failure with hypoxia ([place]) Cont on O2      Akul Leggette, NP 08/26/2017

## 2017-08-26 NOTE — Assessment & Plan Note (Signed)
Cont on O2 .  

## 2017-08-26 NOTE — Addendum Note (Signed)
Addended by: Parke Poisson E on: 08/26/2017 10:20 AM   Modules accepted: Orders

## 2017-08-26 NOTE — Assessment & Plan Note (Signed)
Compensated on present regimen  No changes  

## 2017-08-26 NOTE — Patient Instructions (Addendum)
Continue on BREO daily . Rinse after use.  Continue on BIPAP At bedtime  With oxygen 5lm .  Continue on Oxygen 3l/m rest , 4l/m walking  Follow up with Dr. Elsworth Soho  In 3-4 months and As needed

## 2017-08-28 NOTE — Telephone Encounter (Signed)
Medication has been added to the pt's chart. Nothing further was needed.

## 2017-09-02 ENCOUNTER — Telehealth: Payer: Self-pay | Admitting: Pulmonary Disease

## 2017-09-02 NOTE — Telephone Encounter (Signed)
Form has been retrieved from up front. This has been personally given to [place]. Will route to her for follow up.

## 2017-09-02 NOTE — Telephone Encounter (Signed)
Will have RA to sign forms once he returns to clinic on 09/04/17.

## 2017-09-04 NOTE — Progress Notes (Signed)
Reviewed & agree with plan  

## 2017-09-05 DIAGNOSIS — I5040 Unspecified combined systolic (congestive) and diastolic (congestive) heart failure: Secondary | ICD-10-CM | POA: Diagnosis not present

## 2017-09-05 NOTE — Telephone Encounter (Signed)
Forms have been filled out and signed. Patient is aware. Patient stated that she does not need these forms back. Just needs [place] to fax them to [employer]. Advised patient that I would fax them today. Will send them to scan afterwards.   Nothing else needed at time of call.

## 2017-09-09 ENCOUNTER — Other Ambulatory Visit: Payer: Self-pay | Admitting: Cardiovascular Disease

## 2017-09-28 ENCOUNTER — Telehealth: Payer: Self-pay | Admitting: Student

## 2017-09-28 NOTE — Telephone Encounter (Signed)
    Patient called reporting her weight increased from 291 - 293 lbs last week to 301 lbs today. Has baseline dyspnea on exertion (on 3-4L [place]) but has worsened slightly over the past day. She is currently taking Torsemide 40mg  BID. Recommended she take 60mg  BID for the next two days and report back if weight is not back to baseline. She is aware to go to the ED for further evaluation if her breathing acutely worsens.   She voiced understanding of this and was appreciative of the call.  Signed, Erma Heritage, PA-C 09/28/2017, 10:49 AM Pager: [phone]

## 2017-10-02 ENCOUNTER — Telehealth (HOSPITAL_COMMUNITY): Payer: Self-pay | Admitting: *Deleted

## 2017-10-02 NOTE — Telephone Encounter (Signed)
She can take 60 mg BID for 2-3 more days if she feels like this was helping, but needs BMET this week if able to come. May need extra potassium.  Otherwise keep appt for next week.    Legrand Como [address] [place], PA-C 10/02/2017 10:04 AM

## 2017-10-02 NOTE — Telephone Encounter (Signed)
Called and spoke with patient and she is unable to come in for labs this week.  Her husband who helps her works full time at [employer] and it's too difficult for her to get in and out of her house without his help.  She is going to increase torsemide for 60 mg BID for the next 2-3 days and will plan on seeing up next week as scheduled.  I advised her to go to the ER if she starts to have chest pain or increased shortness of breath.  No further questions.

## 2017-10-02 NOTE — Telephone Encounter (Signed)
Advanced Heart Failure Triage Encounter  Patient Name: Nina Howell  Date of Call: 10/02/17  Problem:  Patient called on-call services over the weekend (see note) complaining of wt gain and increased shortness of breath.  She was advised to increase torsemide to 60 mg BID for 2 days.  Patient stated she did that Saturday, Sunday, and Monday.  She resumed 40 mg BID yesterday.  Her weight Saturday was 301.5 lb, Sunday- 299.5 lb, Monday- 298 lb, Tuesday- 300 lb, and today- 300.5 lbs.  She still complains of increased shortness of breath on her normal 3-4L of oxygen and increased lower extremity edema.    Plan:  Patient has not been seen in clinic since June 2018.  I have already scheduled her a follow up in APP clinic next Wednesday May 22.  I will forward to Barrington Ellison, PA to review and will call patient back with any further changes.   Darron Doom, RN

## 2017-10-05 DIAGNOSIS — I5040 Unspecified combined systolic (congestive) and diastolic (congestive) heart failure: Secondary | ICD-10-CM | POA: Diagnosis not present

## 2017-10-08 NOTE — Progress Notes (Signed)
Patient ID: Nina Howell, female   DOB: 05-30-1940, 77 y.o.   MRN: [id number]    Advanced Heart Failure Clinic Note   Primary Care: Dr. Baird Howell Pulmonologist: Dr. Elsworth Howell Primary Cardiologist: Dr. Haroldine Howell  HPI:  Nina Howell is a 77 y.o. female with history of diastolic CHF, OSA, DM, morbid obesity, and COPD/asthma   Sees Dr Nina Howell for morbid obesity, OHS and OSA on CPAP has been encouraged to lose weight. Previously seen by Dr Nina Howell. Echo 2011 done for dyspnea showed estimated PA pressure 56 mmHg and EF 45-50%. Underwent cath for abnormal ECG and nuclear study. Normal coronaries 2011. Follows with Dr. Johnsie Howell now.   Admitted in 4/17 with respiratory failure and volume overload.. Overall diuresed 30 pounds. On ECHO RV severely dilated and LV EF 50-55%. With RVSP 69. Moderate pericardial effusion  VQ 08/25/15: Limited study due to body habitus. Possible 2 perfusion defects -> recommend chest CT CT 08/26/15: -> No PE Moderate to large pericardial effusion  Echo 7/17 shows LVEF 45%, RV dilated with septal bounce. Mild RV HK Pericardial effusion resolved. Minimal TR. Pulmonary pressures < 40.  Admitted 03/2017 for bronchitis.  She presents today for HF follow up. Last seen 10/2016. She called the clinic last week with weight gain and SOB despite increased torsemide. She has been taking 60 mg torsemide BID since Friday with no change in her weight. She is SOB at rest. +orthopnea and BLE edema. She has chronic nausea. Decreased energy. Appetite okay. No dizziness. She one episode of left sided CP at rest 2 days ago, but thinks it was anxiety. She is back on 2 L O2. Weights are 300-305 lbs at home, but up about 15 lbs over the last 6 weeks. Taking all medications. Limits salt. Trying to limit fluid, but eating a lot of ice.   Echo 10/2016:Marland Kitchen Very poor images  LVEF 55-60% RV dilated. Moderate HK. No TR to measure RVSP  No effusion   Review of systems complete and found to be negative unless  listed in HPI.   Past Medical History:  Diagnosis Date  . Acute pancreatitis   . Anemia   . Asymptomatic cholelithiasis   . Bullous pemphigoid   . Cellulitis    ABDOMINAL WALL  . CHF (congestive heart failure) (HCC)    DECOMPENSATED SYSTOLIC CHF  . CHF (congestive heart failure) ([place])   . CTS (carpal tunnel syndrome)   . Diabetes mellitus, type 2 ([place])   . DJD (degenerative joint disease)   . Hepatic lesion   . Hyperlipidemia   . 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 Hypertension   . Hypokalemia   . Hypothyroidism   . Hypoventilation   . IUD (intrauterine device) in place   . Liver cirrhosis ([place])   . Morbid obesity ([place])   . 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 Sleep apnea    cpap  . Varicose veins     Current Outpatient Medications  Medication Sig Dispense Refill  . acetaminophen (TYLENOL) 500 MG tablet Take 1,000 mg every 8 (eight) hours as needed by mouth for mild pain, moderate pain, fever or headache.    . ALPRAZolam (XANAX) 0.5 MG tablet Take 0.5 mg by mouth daily as needed for anxiety.    Marland Kitchen aspirin 81 MG tablet Take 81 mg at bedtime by mouth.     Marland Kitchen atorvastatin (LIPITOR) 20 MG tablet Take 20 mg by mouth  every other day.    . Cholecalciferol (VITAMIN D) 2000 units tablet Take 2,000 Units daily by mouth.    . Cranberry 400 MG TABS Take 1,600 mg daily by mouth.    . Cyanocobalamin (VITAMIN B-12) 2500 MCG SUBL Place 1 tablet under the tongue daily.    . fluticasone furoate-vilanterol (BREO ELLIPTA) 200-25 MCG/INH AEPB Inhale 1 puff into the lungs daily. 1 each 3  . fluticasone furoate-vilanterol (BREO ELLIPTA) 200-25 MCG/INH AEPB Inhale 1 puff into the lungs daily. 1 each 0  . guaiFENesin (MUCINEX) 600 MG 12 hr tablet Take 1,200 mg 2 (two) times daily by mouth. Started 10/30    . insulin NPH-regular Human (NOVOLIN 70/30) (70-30) 100 UNIT/ML injection Inject 2 Units into the skin  daily with supper. Takes 20 UNITS IN AM    . levothyroxine (SYNTHROID, LEVOTHROID) 50 MCG tablet Take 50 mcg by mouth daily.      Marland Kitchen lidocaine (XYLOCAINE) 5 % ointment Apply 1 application 2 (two) times daily topically.     . Liraglutide (VICTOZA) 18 MG/3ML SOPN Inject 1.8 mg into the skin daily.    . metFORMIN (GLUCOPHAGE) 1000 MG tablet Take 1,000 mg by mouth 2 (two) times daily with a meal.    . Multiple Vitamin (MULTIVITAMIN WITH MINERALS) TABS tablet Take 0.5 tablets by mouth daily.    . niacinamide 500 MG tablet Take 500 mg by mouth 2 (two) times daily with a meal.    . polyethylene glycol (MIRALAX / GLYCOLAX) packet Take 17 g by mouth daily as needed. 14 each 0  . potassium chloride SA (K-DUR,KLOR-CON) 20 MEQ tablet TAKE 2 TABLETS BY MOUTH  EVERY MORNING. TAKE AN  ADDITIONAL TABLET IF YOU  TAKE EXTRA TORSEMIDE 270 tablet 3  . PROAIR HFA 108 (90 Base) MCG/ACT inhaler INHALE TWO PUFFS BY MOUTH EVERY 4 HOURS AS NEEDED FOR WHEEZING OR FOR SHORTNESS OF BREATH 1 Inhaler 2  . sertraline (ZOLOFT) 50 MG tablet Take 100 mg by mouth at bedtime.     Marland Kitchen spironolactone (ALDACTONE) 25 MG tablet Take 1 tablet (25 mg total) by mouth daily. Please call [phone] to schedule appointment for additional refills thanks. 15 tablet 0  . torsemide (DEMADEX) 20 MG tablet Take 2 tablets (40 mg total) by mouth 2 (two) times daily. 28 tablet 0   No current facility-administered medications for this encounter.     Allergies  Allergen Reactions  . Celebrex [Celecoxib] Swelling  . Penicillins Rash    Has patient had a PCN reaction causing immediate rash, facial/tongue/throat swelling, SOB or lightheadedness with hypotension: Yes Has patient had a PCN reaction causing severe rash involving mucus membranes or skin necrosis: Yes Has patient had a PCN reaction that required hospitalization: No Has patient had a PCN reaction occurring within the last 10 years: Yes - Okay taking other cillin's If all of the above answers  are "NO", then may proceed with Cephalosporin use.   . Cephalexin Hives  . Lasix [Furosemide] Hives  . Oxycodone-Acetaminophen Nausea Only  . Cephalosporins Rash  . Oxycodone Anxiety    Felt weird      Social History   Socioeconomic History  . Marital status: Married    Spouse name: Not on file  . Number of children: 4  . Years of education: Not on file  . Highest education level: Not on file  Occupational History  . 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 Financial  resource strain: Not on file  . Food insecurity:    Worry: Not on file    Inability: Not on file  . Transportation needs:    Medical: Not on file    Non-medical: Not on file  Tobacco Use  . Smoking status: Never Smoker  . Smokeless tobacco: Never Used  Substance and Sexual Activity  . Alcohol use: No  . Drug use: No  . Sexual activity: Yes    Birth control/protection: IUD  Lifestyle  . Physical activity:    Days per week: Not on file    Minutes per session: Not on file  . Stress: Not on file  Relationships  . Social connections:    Talks on phone: Not on file    Gets together: Not on file    Attends religious service: Not on file    Active member of club or organization: Not on file    Attends meetings of clubs or organizations: Not on file    Relationship status: Not on file  . Intimate partner violence:    Fear of current or ex partner: Not on file    Emotionally abused: Not on file    Physically abused: Not on file    Forced sexual activity: Not on file  Other Topics Concern  . Not on file  Social History Narrative  . Not on file      Family History  Problem Relation Age of Onset  . Howell Mother        throat  . Diabetes Father   . Leukemia Father     Vitals:   10/09/17 0924  BP: 126/72  Pulse: 83  SpO2: 95%  Weight: (!) 305 lb 5 oz (138.5 kg)     Wt Readings from Last 3 Encounters:  10/09/17 (!) 305 lb 5 oz (138.5 kg)    08/26/17 289 lb (131.1 kg)  06/05/17 (!) 304 lb (137.9 kg)     PHYSICAL EXAM: General: Obese. No resp difficulty. Arrived in wheelchair HEENT: Normal Neck: Supple. JVP ~8. Carotids 2+ bilat; no bruits. No thyromegaly or nodule noted. Cor: PMI nondisplaced. RRR, No M/G/R noted Lungs: CTAB, normal effort. Abdomen: Soft, non-tender, non-distended, no HSM. No bruits or masses. +BS  Extremities: No cyanosis, clubbing, or rash. R and LLE nonpitting edema, Varicose veins present Neuro: Alert & orientedx3, cranial nerves grossly intact. moves all 4 extremities w/o difficulty. Affect pleasant   ASSESSMENT & PLAN:  1. Chronic diastolic HF - Echo 01/22/80 LVEF 50-55%, Grade 1 DD, RV severely dilated and severely. Echo improved in 7/17 EF ~45% with septal bounce. RV dilated with mild to moderate dysfunction. PA pressures < 40. Effsuion resolved. Echo 10/2016: EF 55-60% - NYHA class IIIb - Volume status elevated - Increase torsemide to 80 mg BID. Metolazone 2.5 mg x1 today. BMET and BNP today. Will likely need to increase K supp - Continue spiro 25 mg daily - Refer to [facility] - Refer to HF paramedicine - Discussed importance of dietary compliance, limiting fluid to less than 2L, limiting salt intake, and daily weights.  2. 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  - PA pressures much improved on previous echo after diuresis. Unable to measure RVSP today on echo.  - We discussed R +/- LHC to clearly define pressures. But wants to defer for now  - VQ scan was indeterminate but Chest CT not suggestive of chronic PE,  LE dopplers negative.  - ANA 1:80 Positive, RF, P-ANCA negative,  ESR 35. Likely not clinically significant.  - Follows with Dr. Elsworth Howell. No change.  - Back on 2 L O2 at all times 3. Pericardial effusion, moderate to large - Resolved with diuresis. No change 4. Morbid obesity - Body mass index is 57.69  kg/m. 5. Abnormal ECG with inferior Q waves - No s/s ischemia. Does not want to proceed with cath at this time. Can revisit as needed.  6. Obesity hypoventilation syndrome - Continued to counsel on weight loss. No change - Back on 2 L O2 at all times 7. OSA on BiPAP - Continue BiPAP nightly 8. Bullous pemphigoid - Followed by Dermatology. No change.   BMET, BNP today Metolazone 2.5 mg x1 today Increase torsemide to 80 mg BID Refer to [facility] for home health RN Refer to HF paramedicine Follow up in 1 week and in 2 weeks. Will need BMET at follow up next week.  Georgiana Shore, NP  9:37 AM  Greater than 50% of the 58 minute visit was spent in counseling/coordination of care regarding disease state education, salt/fluid restriction, sliding scale diuretics, and medication compliance.

## 2017-10-09 ENCOUNTER — Ambulatory Visit (HOSPITAL_COMMUNITY)
Admission: RE | Admit: 2017-10-09 | Discharge: 2017-10-09 | Disposition: A | Discharge status: Home / Self Care | Payer: BLUE CROSS/BLUE SHIELD | Source: Ambulatory Visit | Attending: Internal Medicine | Admitting: Internal Medicine

## 2017-10-09 ENCOUNTER — Telehealth (HOSPITAL_COMMUNITY): Payer: Self-pay | Admitting: *Deleted

## 2017-10-09 ENCOUNTER — Encounter (HOSPITAL_COMMUNITY): Payer: Self-pay

## 2017-10-09 ENCOUNTER — Other Ambulatory Visit: Payer: Self-pay

## 2017-10-09 VITALS — BP 126/72 | HR 83 | Wt 305.3 lb

## 2017-10-09 DIAGNOSIS — I272 Pulmonary hypertension, unspecified: Secondary | ICD-10-CM | POA: Insufficient documentation

## 2017-10-09 DIAGNOSIS — Z9981 Dependence on supplemental oxygen: Secondary | ICD-10-CM | POA: Insufficient documentation

## 2017-10-09 DIAGNOSIS — I110 Hypertensive heart disease with heart failure: Secondary | ICD-10-CM | POA: Insufficient documentation

## 2017-10-09 DIAGNOSIS — Z7982 Long term (current) use of aspirin: Secondary | ICD-10-CM | POA: Insufficient documentation

## 2017-10-09 DIAGNOSIS — G4733 Obstructive sleep apnea (adult) (pediatric): Secondary | ICD-10-CM

## 2017-10-09 DIAGNOSIS — K761 Chronic passive congestion of liver: Secondary | ICD-10-CM | POA: Insufficient documentation

## 2017-10-09 DIAGNOSIS — Z806 Family history of leukemia: Secondary | ICD-10-CM | POA: Insufficient documentation

## 2017-10-09 DIAGNOSIS — Z888 Allergy status to other drugs, medicaments and biological substances status: Secondary | ICD-10-CM | POA: Insufficient documentation

## 2017-10-09 DIAGNOSIS — Z6841 Body Mass Index (BMI) 40.0 and over, adult: Secondary | ICD-10-CM | POA: Insufficient documentation

## 2017-10-09 DIAGNOSIS — Z7951 Long term (current) use of inhaled steroids: Secondary | ICD-10-CM | POA: Insufficient documentation

## 2017-10-09 DIAGNOSIS — E119 Type 2 diabetes mellitus without complications: Secondary | ICD-10-CM | POA: Insufficient documentation

## 2017-10-09 DIAGNOSIS — Z833 Family history of diabetes mellitus: Secondary | ICD-10-CM | POA: Insufficient documentation

## 2017-10-09 DIAGNOSIS — E039 Hypothyroidism, unspecified: Secondary | ICD-10-CM | POA: Diagnosis not present

## 2017-10-09 DIAGNOSIS — R9431 Abnormal electrocardiogram [ECG] [EKG]: Secondary | ICD-10-CM | POA: Insufficient documentation

## 2017-10-09 DIAGNOSIS — Z881 Allergy status to other antibiotic agents status: Secondary | ICD-10-CM | POA: Insufficient documentation

## 2017-10-09 DIAGNOSIS — Z79899 Other long term (current) drug therapy: Secondary | ICD-10-CM | POA: Diagnosis not present

## 2017-10-09 DIAGNOSIS — Z880 Allergy status to penicillin: Secondary | ICD-10-CM | POA: Diagnosis not present

## 2017-10-09 DIAGNOSIS — J449 Chronic obstructive pulmonary disease, unspecified: Secondary | ICD-10-CM | POA: Insufficient documentation

## 2017-10-09 DIAGNOSIS — Z794 Long term (current) use of insulin: Secondary | ICD-10-CM | POA: Diagnosis not present

## 2017-10-09 DIAGNOSIS — M1990 Unspecified osteoarthritis, unspecified site: Secondary | ICD-10-CM | POA: Diagnosis not present

## 2017-10-09 DIAGNOSIS — G5600 Carpal tunnel syndrome, unspecified upper limb: Secondary | ICD-10-CM | POA: Insufficient documentation

## 2017-10-09 DIAGNOSIS — I5032 Chronic diastolic (congestive) heart failure: Secondary | ICD-10-CM | POA: Insufficient documentation

## 2017-10-09 DIAGNOSIS — Z79890 Hormone replacement therapy (postmenopausal): Secondary | ICD-10-CM | POA: Diagnosis not present

## 2017-10-09 DIAGNOSIS — Z808 Family history of malignant neoplasm of other organs or systems: Secondary | ICD-10-CM | POA: Diagnosis not present

## 2017-10-09 DIAGNOSIS — E662 Morbid (severe) obesity with alveolar hypoventilation: Secondary | ICD-10-CM | POA: Diagnosis not present

## 2017-10-09 DIAGNOSIS — E785 Hyperlipidemia, unspecified: Secondary | ICD-10-CM | POA: Insufficient documentation

## 2017-10-09 DIAGNOSIS — Z885 Allergy status to narcotic agent status: Secondary | ICD-10-CM | POA: Insufficient documentation

## 2017-10-09 DIAGNOSIS — Z886 Allergy status to analgesic agent status: Secondary | ICD-10-CM | POA: Insufficient documentation

## 2017-10-09 LAB — BASIC METABOLIC PANEL
ANION GAP: 13 (ref 5–15)
BUN: 20 mg/dL (ref 6–20)
CALCIUM: 8.9 mg/dL (ref 8.9–10.3)
CO2: 31 mmol/L (ref 22–32)
Chloride: 94 mmol/L — ABNORMAL LOW (ref 101–111)
Creatinine, Ser: 0.92 mg/dL (ref 0.44–1.00)
GFR calc Af Amer: >60 mL/min (ref >60)
GFR calc non Af Amer: 59 mL/min — ABNORMAL LOW (ref >60)
GLUCOSE: 169 mg/dL — AB (ref 65–99)
Potassium: 3.3 mmol/L — ABNORMAL LOW (ref 3.5–5.1)
Sodium: 138 mmol/L (ref 135–145)

## 2017-10-09 LAB — BRAIN NATRIURETIC PEPTIDE: B Natriuretic Peptide: 1559.2 pg/mL — ABNORMAL HIGH (ref 0.0–100.0)

## 2017-10-09 MED ORDER — METOLAZONE 2.5 MG PO TABS: 2.5000 mg | ORAL_TABLET | ORAL | 3 refills | Status: DC | PRN

## 2017-10-09 MED ORDER — TORSEMIDE 20 MG PO TABS: 80.0000 mg | ORAL_TABLET | Freq: Two times a day (BID) | ORAL | 3 refills | Status: DC

## 2017-10-09 MED ORDER — POTASSIUM CHLORIDE CRYS ER 20 MEQ PO TBCR: 40.0000 meq | EXTENDED_RELEASE_TABLET | Freq: Every day | ORAL | 3 refills | Status: DC

## 2017-10-09 NOTE — Progress Notes (Signed)
Heart Failure Clinic Social Work Assessment  Teshara Moree  presents today in association with an [facility] Appointment.  Patient seen today by CSW for follow up/assistance on Educational concerns Health problems Loss of independence and/or  mobility issues affecting transportation.  Social Determinants impacting successful heart failure regimen:  Housing: patient lives with her husband. Food: Do you have enough food? Yes  Do you know and understand healthy eating and how that affects your heart failure diagnosis? Yes  Do you follow a low salt diet?  Yes  Utilities: Do you have gas and/or electricity on in your home? Yes  Income: What is your source of income? Social Security  Insurance: Medicare Transportation: Do you have transportation to your medical appointments?Yes  If yes, how? Husband although due to mobility concerns she has had a hard time recently getting up and down stairs.    Daily Health Needs: Do you have a scale and weigh each day?  Yes  Do you have your medications? Yes  Are you able to adhere to your medication regimen? Yes   Do you ever take medications differently than prescribed? No  Do you know the zones of Heart Failure?  Yes  Do you know how to contact the HF Clinic appropriately with worsening symptoms or weight increases?  Yes    Do you have any identified obstacles / challenges for adherence to current treatment plan? Yes  Transport becoming an issue and feeling depressed due to decline and mobility and loss of independence. Patient reports she spends all of her time upstairs in the bedroom unless she has a MD appointment.   Patient appears frustrated and down about loss of independence but states "I keep a smile on my face".    CSW assisted patient with Supportive Counseling and referral to [employer] with the goal to Increase healthy adjustment to current life circumstances and Begin healthy grieving over loss    Heart Failure Clinic Social Worker will continue to coordinate and monitor patient's treatment plan as needed.  CSW continues to be available for identified needs. Raquel Sarna, [place], [place] Negron Torres

## 2017-10-09 NOTE — Patient Instructions (Signed)
Labs today (will call for abnormal results, otherwise no news is good news)  TAKE Metolazone 2.5 mg Today ONLY.  INCREASE Torsemide to 80 mg (4 Tablets) Twice Daily  Referral placed for Home Health, they will contact you to setup intial visit.  Paramedicine has been ordered for you to assist with home medications.  Follow up in 1 week and 2 weeks.

## 2017-10-09 NOTE — Addendum Note (Signed)
Encounter addended by: Louann Liv, LCSW on: 10/09/2017 11:16 AM  Actions taken: Sign clinical note

## 2017-10-09 NOTE — Telephone Encounter (Signed)
Result Notes for Basic Metabolic Panel (BMET)   Notes recorded by Darron Doom, RN on 10/09/2017 at 12:43 PM EDT Called and spoke with Nira Conn, paramedic helping patient inside her home. She verbalized that she will have patient increase potassium to 40 mEq BID. Medication increase sent to pharmacy. ------  Notes recorded by Georgiana Shore, NP on 10/09/2017 at 12:17 PM EDT BNP is elevated. She is taking metolazone today and then increasing her torsemide. Has close follow up next week. ------  Notes recorded by Georgiana Shore, NP on 10/09/2017 at 12:15 PM EDT Potassium is a little low. Please have her increase her potassium supplement to 40 meq BID. We will recheck labs at her follow up next week.

## 2017-10-11 DIAGNOSIS — L120 Bullous pemphigoid: Secondary | ICD-10-CM | POA: Diagnosis not present

## 2017-10-11 DIAGNOSIS — E039 Hypothyroidism, unspecified: Secondary | ICD-10-CM | POA: Diagnosis not present

## 2017-10-11 DIAGNOSIS — J449 Chronic obstructive pulmonary disease, unspecified: Secondary | ICD-10-CM | POA: Diagnosis not present

## 2017-10-11 DIAGNOSIS — K7460 Unspecified cirrhosis of liver: Secondary | ICD-10-CM | POA: Diagnosis not present

## 2017-10-11 DIAGNOSIS — I872 Venous insufficiency (chronic) (peripheral): Secondary | ICD-10-CM | POA: Diagnosis not present

## 2017-10-11 DIAGNOSIS — I272 Pulmonary hypertension, unspecified: Secondary | ICD-10-CM | POA: Diagnosis not present

## 2017-10-11 DIAGNOSIS — E119 Type 2 diabetes mellitus without complications: Secondary | ICD-10-CM | POA: Diagnosis not present

## 2017-10-11 DIAGNOSIS — Z7951 Long term (current) use of inhaled steroids: Secondary | ICD-10-CM | POA: Diagnosis not present

## 2017-10-11 DIAGNOSIS — F329 Major depressive disorder, single episode, unspecified: Secondary | ICD-10-CM | POA: Diagnosis not present

## 2017-10-11 DIAGNOSIS — I110 Hypertensive heart disease with heart failure: Secondary | ICD-10-CM | POA: Diagnosis not present

## 2017-10-11 DIAGNOSIS — E662 Morbid (severe) obesity with alveolar hypoventilation: Secondary | ICD-10-CM | POA: Diagnosis not present

## 2017-10-11 DIAGNOSIS — G4733 Obstructive sleep apnea (adult) (pediatric): Secondary | ICD-10-CM | POA: Diagnosis not present

## 2017-10-11 DIAGNOSIS — I5032 Chronic diastolic (congestive) heart failure: Secondary | ICD-10-CM | POA: Diagnosis not present

## 2017-10-14 DIAGNOSIS — E119 Type 2 diabetes mellitus without complications: Secondary | ICD-10-CM | POA: Diagnosis not present

## 2017-10-14 DIAGNOSIS — G4733 Obstructive sleep apnea (adult) (pediatric): Secondary | ICD-10-CM | POA: Diagnosis not present

## 2017-10-14 DIAGNOSIS — L120 Bullous pemphigoid: Secondary | ICD-10-CM | POA: Diagnosis not present

## 2017-10-14 DIAGNOSIS — Z7951 Long term (current) use of inhaled steroids: Secondary | ICD-10-CM | POA: Diagnosis not present

## 2017-10-14 DIAGNOSIS — E039 Hypothyroidism, unspecified: Secondary | ICD-10-CM | POA: Diagnosis not present

## 2017-10-14 DIAGNOSIS — F329 Major depressive disorder, single episode, unspecified: Secondary | ICD-10-CM | POA: Diagnosis not present

## 2017-10-14 DIAGNOSIS — J449 Chronic obstructive pulmonary disease, unspecified: Secondary | ICD-10-CM | POA: Diagnosis not present

## 2017-10-14 DIAGNOSIS — I110 Hypertensive heart disease with heart failure: Secondary | ICD-10-CM | POA: Diagnosis not present

## 2017-10-14 DIAGNOSIS — K7460 Unspecified cirrhosis of liver: Secondary | ICD-10-CM | POA: Diagnosis not present

## 2017-10-14 DIAGNOSIS — I272 Pulmonary hypertension, unspecified: Secondary | ICD-10-CM | POA: Diagnosis not present

## 2017-10-14 DIAGNOSIS — E662 Morbid (severe) obesity with alveolar hypoventilation: Secondary | ICD-10-CM | POA: Diagnosis not present

## 2017-10-14 DIAGNOSIS — I872 Venous insufficiency (chronic) (peripheral): Secondary | ICD-10-CM | POA: Diagnosis not present

## 2017-10-14 DIAGNOSIS — I5032 Chronic diastolic (congestive) heart failure: Secondary | ICD-10-CM | POA: Diagnosis not present

## 2017-10-15 ENCOUNTER — Telehealth (HOSPITAL_COMMUNITY): Payer: Self-pay

## 2017-10-15 NOTE — Telephone Encounter (Signed)
Patient calling CHF clinic traige line to report 4 lb weight gain overnight.  Had to increase O2 from 3 to 4 liters, and states feet are "puffy like balls".  Patient does not sound in distress or SOB talking on the phone. States she ate "oodles of noodles, egg sandwhich, and chili beans from [employer]" yesterday. Educated on low salt diet and advised per Oda Kilts PA-C to take prn metolazone 2.5 mg tablet once now.  Advised to elevated feet. Patient has appt with [place] tomorrow morning, reminded her of this appt and advised to return call to clinic this afternoon before 5 if no better. Patient aware and appreciative.  [name] Pain, RN

## 2017-10-15 NOTE — Progress Notes (Signed)
Patient ID: Nina Howell, female   DOB: May 25, 1940, 76 y.o.   MRN: [id number]    Advanced Heart Failure Clinic Note   Primary Care: Dr. Baird Cancer Pulmonologist: Dr. Elsworth Soho Primary Cardiologist: Dr. Haroldine Laws  HPI:  Nina Howell is a 77 y.o. female with history of diastolic CHF, OSA, DM, morbid obesity, and COPD/asthma   Sees Dr Elsworth Soho for morbid obesity, OHS and OSA on CPAP has been encouraged to lose weight. Previously seen by Dr [name]. Echo 2011 done for dyspnea showed estimated PA pressure 56 mmHg and EF 45-50%. Underwent cath for abnormal ECG and nuclear study. Normal coronaries 2011. Follows with Dr. Johnsie Cancel now.   Admitted in 4/17 with respiratory failure and volume overload.. Overall diuresed 30 pounds. On ECHO RV severely dilated and LV EF 50-55%. With RVSP 69. Moderate pericardial effusion  VQ 08/25/15: Limited study due to body habitus. Possible 2 perfusion defects -> recommend chest CT CT 08/26/15: -> No PE Moderate to large pericardial effusion  Echo 7/17 shows LVEF 45%, RV dilated with septal bounce. Mild RV HK Pericardial effusion resolved. Minimal TR. Pulmonary pressures < 40.  Admitted 03/2017 for bronchitis.  She presents today for 1 week follow up. Last week was volume overloaded and torsemide increased. Weight down about 5 lbs by home scale. Up several lbs overnight.  She has not taken any additional metolazone. No long SOB. Continues to have orthopnea, though BLE edema improved. She denies lightheadedness or dizziness. No CP. Weight 305 -> 298. She has been as low as 286 in the past several months. Taking all medications. Watching salt intake, but eating ice.   Echo 10/2016:Marland Kitchen Very poor images  LVEF 55-60% RV dilated. Moderate HK. No TR to measure RVSP  No effusion   Review of systems complete and found to be negative unless listed in HPI.    Past Medical History:  Diagnosis Date  . Acute pancreatitis   . Anemia   . Asymptomatic cholelithiasis   . Bullous  pemphigoid   . Cellulitis    ABDOMINAL WALL  . CHF (congestive heart failure) (HCC)    DECOMPENSATED SYSTOLIC CHF  . CHF (congestive heart failure) ([place])   . CTS (carpal tunnel syndrome)   . Diabetes mellitus, type 2 ([place])   . DJD (degenerative joint disease)   . Hepatic lesion   . Hyperlipidemia   . 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 Hypertension   . Hypokalemia   . Hypothyroidism   . Hypoventilation   . IUD (intrauterine device) in place   . Liver cirrhosis ([place])   . Morbid obesity ([place])   . 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 Sleep apnea    cpap  . Varicose veins     Current Outpatient Medications  Medication Sig Dispense Refill  . acetaminophen (TYLENOL) 500 MG tablet Take 1,000 mg every 8 (eight) hours as needed by mouth for mild pain, moderate pain, fever or headache.    . ALPRAZolam (XANAX) 0.5 MG tablet Take 0.5 mg by mouth daily as needed for anxiety.    Marland Kitchen aspirin 81 MG tablet Take 81 mg at bedtime by mouth.     Marland Kitchen atorvastatin (LIPITOR) 20 MG tablet Take 20 mg by mouth every other day.    . Cholecalciferol (VITAMIN D) 2000 units tablet Take 2,000 Units daily by mouth.    . Cranberry 400 MG TABS Take 1,600 mg daily by mouth.    Marland Kitchen  Cyanocobalamin (VITAMIN B-12) 2500 MCG SUBL Place 1 tablet under the tongue daily.    . fluticasone furoate-vilanterol (BREO ELLIPTA) 200-25 MCG/INH AEPB Inhale 1 puff into the lungs daily. 1 each 3  . fluticasone furoate-vilanterol (BREO ELLIPTA) 200-25 MCG/INH AEPB Inhale 1 puff into the lungs daily. 1 each 0  . guaiFENesin (MUCINEX) 600 MG 12 hr tablet Take 1,200 mg 2 (two) times daily by mouth. Started 10/30    . insulin NPH-regular Human (NOVOLIN 70/30) (70-30) 100 UNIT/ML injection Inject 2 Units into the skin daily with supper. Takes 20 UNITS IN AM    . levothyroxine (SYNTHROID, LEVOTHROID) 50 MCG tablet Take 50 mcg by mouth daily.      Marland Kitchen  lidocaine (XYLOCAINE) 5 % ointment Apply 1 application 2 (two) times daily topically.     . Liraglutide (VICTOZA) 18 MG/3ML SOPN Inject 1.8 mg into the skin daily.    . metFORMIN (GLUCOPHAGE) 1000 MG tablet Take 1,000 mg by mouth 2 (two) times daily with a meal.    . metolazone (ZAROXOLYN) 2.5 MG tablet Take 1 tablet (2.5 mg total) by mouth as needed. Take only as directed by CHF Clinic 5 tablet 3  . Multiple Vitamin (MULTIVITAMIN WITH MINERALS) TABS tablet Take 0.5 tablets by mouth daily.    . niacinamide 500 MG tablet Take 500 mg by mouth 2 (two) times daily with a meal.    . polyethylene glycol (MIRALAX / GLYCOLAX) packet Take 17 g by mouth daily as needed. 14 each 0  . potassium chloride SA (K-DUR,KLOR-CON) 20 MEQ tablet Take 40 mEq by mouth 2 (two) times daily.    Marland Kitchen PROAIR HFA 108 (90 Base) MCG/ACT inhaler INHALE TWO PUFFS BY MOUTH EVERY 4 HOURS AS NEEDED FOR WHEEZING OR FOR SHORTNESS OF BREATH 1 Inhaler 2  . sertraline (ZOLOFT) 50 MG tablet Take 100 mg by mouth at bedtime.     Marland Kitchen spironolactone (ALDACTONE) 25 MG tablet Take 1 tablet (25 mg total) by mouth daily. Please call [phone] to schedule appointment for additional refills thanks. 15 tablet 0  . torsemide (DEMADEX) 20 MG tablet Take 4 tablets (80 mg total) by mouth 2 (two) times daily. 240 tablet 3   No current facility-administered medications for this encounter.     Allergies  Allergen Reactions  . Celebrex [Celecoxib] Swelling  . Penicillins Rash    Has patient had a PCN reaction causing immediate rash, facial/tongue/throat swelling, SOB or lightheadedness with hypotension: Yes Has patient had a PCN reaction causing severe rash involving mucus membranes or skin necrosis: Yes Has patient had a PCN reaction that required hospitalization: No Has patient had a PCN reaction occurring within the last 10 years: Yes - Okay taking other cillin's If all of the above answers are "NO", then may proceed with Cephalosporin use.   .  Cephalexin Hives  . Lasix [Furosemide] Hives  . Oxycodone-Acetaminophen Nausea Only  . Cephalosporins Rash  . Oxycodone Anxiety    Felt weird      Social History   Socioeconomic History  . Marital status: Married    Spouse name: Not on file  . Number of children: 4  . Years of education: Not on file  . Highest education level: Not on file  Occupational History  . 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 Financial resource strain: Not on file  . Food insecurity:    Worry: Not on file    Inability: Not  on file  . Transportation needs:    Medical: Not on file    Non-medical: Not on file  Tobacco Use  . Smoking status: Never Smoker  . Smokeless tobacco: Never Used  Substance and Sexual Activity  . Alcohol use: No  . Drug use: No  . Sexual activity: Yes    Birth control/protection: IUD  Lifestyle  . Physical activity:    Days per week: Not on file    Minutes per session: Not on file  . Stress: Not on file  Relationships  . Social connections:    Talks on phone: Not on file    Gets together: Not on file    Attends religious service: Not on file    Active member of club or organization: Not on file    Attends meetings of clubs or organizations: Not on file    Relationship status: Not on file  . Intimate partner violence:    Fear of current or ex partner: Not on file    Emotionally abused: Not on file    Physically abused: Not on file    Forced sexual activity: Not on file  Other Topics Concern  . Not on file  Social History Narrative  . Not on file      Family History  Problem Relation Age of Onset  . Cancer Mother        throat  . Diabetes Father   . Leukemia Father     Vitals:   10/16/17 0922  BP: 110/72  Pulse: 92  SpO2: 91%  Weight: 298 lb (135.2 kg)     Wt Readings from Last 3 Encounters:  10/16/17 298 lb (135.2 kg)  10/09/17 (!) 305 lb 5 oz (138.5 kg)  08/26/17 289 lb (131.1 kg)     PHYSICAL  EXAM:  General: Elderly appearing. Obese. NAD. In [place].  HEENT: Normal Neck: Supple. JVP 7-8 cm. Carotids 2+ bilat; no bruits. No thyromegaly or nodule noted. Cor: PMI nondisplaced. RRR, No M/G/R noted Lungs: CTAB, normal effort. Abdomen: Soft, non-tender, non-distended, no HSM. No bruits or masses. +BS  Extremities: No cyanosis, clubbing, or rash. Trace to 1+ non-pitting edema. +BLE varicosities.  Neuro: Alert & orientedx3, cranial nerves grossly intact. moves all 4 extremities w/o difficulty. Affect pleasant   ASSESSMENT & PLAN:  1. Chronic diastolic HF - Echo 01/22/69 LVEF 50-55%, Grade 1 DD, RV severely dilated and severely. Echo improved in 7/17 EF ~45% with septal bounce. RV dilated with mild to moderate dysfunction. PA pressures < 40. Effsuion resolved. Echo 10/2016: EF 55-60% - NYHA III-IIIb - Volume status mildly elevated on exam.   - Continue torsemide 80 mg BID. BMET today.  - Take metolazone 2.5 mg today.  - Continue spiro 25 mg daily - Referred to [facility] for HH - Referred to HF paramedicine - Reinforced fluid restriction to < 2 L daily, sodium restriction to less than 2000 mg daily, and the importance of daily weights.   2. 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  - PA pressures much improved on previous echo after diuresis. Unable to measure RVSP today on echo.  - We discussed R +/- LHC to clearly define pressures. Wishes to defer cath for now.  - VQ scan was indeterminate but Chest CT not suggestive of chronic PE, LE dopplers negative.  - ANA 1:80 Positive, RF, P-ANCA negative,  ESR 35. Likely not clinically significant.  - Follows  with Dr. Elsworth Soho. No change. - Back on 2 L O2 at all times 3. Pericardial effusion, moderate to large - Resolved with diuresis. No change.  4. Morbid obesity - Body mass index is 56.31 kg/m.  - Encouraged weight loss.  5. Abnormal ECG with inferior Q waves - No s/s of  ischemia.    - Refuses cath at this time.  6. Obesity hypoventilation syndrome - Continued to counsel on weight loss. No change.  - Back on 2 L O2 at all times 7. OSA on BiPAP - Encouraged nightly use.  8. Bullous pemphigoid - Followed by Dermatology. No change.   Labs and meds as above. RTC 2 weeks. May need to consider RHC if no improvement.   Shirley Friar, PA-C  9:33 AM   Greater than 50% of the 25 minute visit was spent in counseling/coordination of care regarding disease state education, salt/fluid restriction, sliding scale diuretics, and medication compliance.

## 2017-10-16 ENCOUNTER — Ambulatory Visit (HOSPITAL_COMMUNITY)
Admission: RE | Admit: 2017-10-16 | Discharge: 2017-10-16 | Disposition: A | Discharge status: Home / Self Care | Payer: BLUE CROSS/BLUE SHIELD | Source: Ambulatory Visit | Attending: Internal Medicine | Admitting: Internal Medicine

## 2017-10-16 VITALS — BP 110/72 | HR 92 | Wt 298.0 lb

## 2017-10-16 DIAGNOSIS — Z79899 Other long term (current) drug therapy: Secondary | ICD-10-CM | POA: Insufficient documentation

## 2017-10-16 DIAGNOSIS — I272 Pulmonary hypertension, unspecified: Secondary | ICD-10-CM | POA: Insufficient documentation

## 2017-10-16 DIAGNOSIS — Z800 Family history of malignant neoplasm of digestive organs: Secondary | ICD-10-CM | POA: Diagnosis not present

## 2017-10-16 DIAGNOSIS — Z881 Allergy status to other antibiotic agents status: Secondary | ICD-10-CM | POA: Insufficient documentation

## 2017-10-16 DIAGNOSIS — Z09 Encounter for follow-up examination after completed treatment for conditions other than malignant neoplasm: Secondary | ICD-10-CM | POA: Diagnosis not present

## 2017-10-16 DIAGNOSIS — Z880 Allergy status to penicillin: Secondary | ICD-10-CM | POA: Insufficient documentation

## 2017-10-16 DIAGNOSIS — J449 Chronic obstructive pulmonary disease, unspecified: Secondary | ICD-10-CM | POA: Diagnosis not present

## 2017-10-16 DIAGNOSIS — Z886 Allergy status to analgesic agent status: Secondary | ICD-10-CM | POA: Insufficient documentation

## 2017-10-16 DIAGNOSIS — L120 Bullous pemphigoid: Secondary | ICD-10-CM | POA: Insufficient documentation

## 2017-10-16 DIAGNOSIS — Z862 Personal history of diseases of the blood and blood-forming organs and certain disorders involving the immune mechanism: Secondary | ICD-10-CM | POA: Insufficient documentation

## 2017-10-16 DIAGNOSIS — Z888 Allergy status to other drugs, medicaments and biological substances status: Secondary | ICD-10-CM | POA: Diagnosis not present

## 2017-10-16 DIAGNOSIS — Z885 Allergy status to narcotic agent status: Secondary | ICD-10-CM | POA: Insufficient documentation

## 2017-10-16 DIAGNOSIS — I5032 Chronic diastolic (congestive) heart failure: Secondary | ICD-10-CM | POA: Diagnosis not present

## 2017-10-16 DIAGNOSIS — Z6841 Body Mass Index (BMI) 40.0 and over, adult: Secondary | ICD-10-CM | POA: Insufficient documentation

## 2017-10-16 DIAGNOSIS — Z7982 Long term (current) use of aspirin: Secondary | ICD-10-CM | POA: Diagnosis not present

## 2017-10-16 DIAGNOSIS — Z794 Long term (current) use of insulin: Secondary | ICD-10-CM | POA: Insufficient documentation

## 2017-10-16 DIAGNOSIS — L97921 Non-pressure chronic ulcer of unspecified part of left lower leg limited to breakdown of skin: Secondary | ICD-10-CM | POA: Diagnosis not present

## 2017-10-16 DIAGNOSIS — I313 Pericardial effusion (noninflammatory): Secondary | ICD-10-CM | POA: Diagnosis not present

## 2017-10-16 DIAGNOSIS — I110 Hypertensive heart disease with heart failure: Secondary | ICD-10-CM | POA: Diagnosis not present

## 2017-10-16 DIAGNOSIS — Z9981 Dependence on supplemental oxygen: Secondary | ICD-10-CM | POA: Diagnosis not present

## 2017-10-16 DIAGNOSIS — K761 Chronic passive congestion of liver: Secondary | ICD-10-CM | POA: Diagnosis not present

## 2017-10-16 DIAGNOSIS — G5600 Carpal tunnel syndrome, unspecified upper limb: Secondary | ICD-10-CM | POA: Insufficient documentation

## 2017-10-16 DIAGNOSIS — E119 Type 2 diabetes mellitus without complications: Secondary | ICD-10-CM | POA: Diagnosis not present

## 2017-10-16 DIAGNOSIS — G4733 Obstructive sleep apnea (adult) (pediatric): Secondary | ICD-10-CM | POA: Insufficient documentation

## 2017-10-16 DIAGNOSIS — Z806 Family history of leukemia: Secondary | ICD-10-CM | POA: Insufficient documentation

## 2017-10-16 DIAGNOSIS — R9431 Abnormal electrocardiogram [ECG] [EKG]: Secondary | ICD-10-CM | POA: Diagnosis not present

## 2017-10-16 DIAGNOSIS — E039 Hypothyroidism, unspecified: Secondary | ICD-10-CM | POA: Insufficient documentation

## 2017-10-16 DIAGNOSIS — R21 Rash and other nonspecific skin eruption: Secondary | ICD-10-CM | POA: Insufficient documentation

## 2017-10-16 DIAGNOSIS — Z833 Family history of diabetes mellitus: Secondary | ICD-10-CM | POA: Insufficient documentation

## 2017-10-16 DIAGNOSIS — M1990 Unspecified osteoarthritis, unspecified site: Secondary | ICD-10-CM | POA: Insufficient documentation

## 2017-10-16 DIAGNOSIS — E662 Morbid (severe) obesity with alveolar hypoventilation: Secondary | ICD-10-CM

## 2017-10-16 DIAGNOSIS — E785 Hyperlipidemia, unspecified: Secondary | ICD-10-CM | POA: Insufficient documentation

## 2017-10-16 DIAGNOSIS — I5022 Chronic systolic (congestive) heart failure: Secondary | ICD-10-CM

## 2017-10-16 DIAGNOSIS — Z79890 Hormone replacement therapy (postmenopausal): Secondary | ICD-10-CM | POA: Insufficient documentation

## 2017-10-16 DIAGNOSIS — G4730 Sleep apnea, unspecified: Secondary | ICD-10-CM | POA: Insufficient documentation

## 2017-10-16 LAB — BASIC METABOLIC PANEL
Anion gap: 14 (ref 5–15)
BUN: 23 mg/dL — AB (ref 6–20)
CHLORIDE: 93 mmol/L — AB (ref 101–111)
CO2: 31 mmol/L (ref 22–32)
Calcium: 9.5 mg/dL (ref 8.9–10.3)
Creatinine, Ser: 0.98 mg/dL (ref 0.44–1.00)
GFR calc Af Amer: >60 mL/min (ref >60)
GFR calc non Af Amer: 55 mL/min — ABNORMAL LOW (ref >60)
Glucose, Bld: 144 mg/dL — ABNORMAL HIGH (ref 65–99)
POTASSIUM: 3.5 mmol/L (ref 3.5–5.1)
Sodium: 138 mmol/L (ref 135–145)

## 2017-10-16 NOTE — Patient Instructions (Signed)
Routine lab work today. Will notify you of abnormal results, otherwise no news is good news!  Take metolazone one tablet today, then take torsemide 30 minutes later.  Will have Zack with paramedicine come see you next week.  Follow up 2 weeks with Oda Kilts PA-C.  ______________________________________________________________ Nina Howell Code:1300  Take all medication as prescribed the day of your appointment. Bring all medications with you to your appointment.  Do the following things EVERYDAY: 1) Weigh yourself in the morning before breakfast. Write it down and keep it in a log. 2) Take your medicines as prescribed 3) Eat low salt foods-Limit salt (sodium) to 2000 mg per day.  4) Stay as active as you can everyday 5) Limit all fluids for the day to less than 2 liters

## 2017-10-17 DIAGNOSIS — K7460 Unspecified cirrhosis of liver: Secondary | ICD-10-CM | POA: Diagnosis not present

## 2017-10-17 DIAGNOSIS — G4733 Obstructive sleep apnea (adult) (pediatric): Secondary | ICD-10-CM | POA: Diagnosis not present

## 2017-10-17 DIAGNOSIS — E119 Type 2 diabetes mellitus without complications: Secondary | ICD-10-CM | POA: Diagnosis not present

## 2017-10-17 DIAGNOSIS — Z7951 Long term (current) use of inhaled steroids: Secondary | ICD-10-CM | POA: Diagnosis not present

## 2017-10-17 DIAGNOSIS — I110 Hypertensive heart disease with heart failure: Secondary | ICD-10-CM | POA: Diagnosis not present

## 2017-10-17 DIAGNOSIS — I872 Venous insufficiency (chronic) (peripheral): Secondary | ICD-10-CM | POA: Diagnosis not present

## 2017-10-17 DIAGNOSIS — F329 Major depressive disorder, single episode, unspecified: Secondary | ICD-10-CM | POA: Diagnosis not present

## 2017-10-17 DIAGNOSIS — I5032 Chronic diastolic (congestive) heart failure: Secondary | ICD-10-CM | POA: Diagnosis not present

## 2017-10-17 DIAGNOSIS — E662 Morbid (severe) obesity with alveolar hypoventilation: Secondary | ICD-10-CM | POA: Diagnosis not present

## 2017-10-17 DIAGNOSIS — E039 Hypothyroidism, unspecified: Secondary | ICD-10-CM | POA: Diagnosis not present

## 2017-10-17 DIAGNOSIS — L120 Bullous pemphigoid: Secondary | ICD-10-CM | POA: Diagnosis not present

## 2017-10-17 DIAGNOSIS — I272 Pulmonary hypertension, unspecified: Secondary | ICD-10-CM | POA: Diagnosis not present

## 2017-10-17 DIAGNOSIS — J449 Chronic obstructive pulmonary disease, unspecified: Secondary | ICD-10-CM | POA: Diagnosis not present

## 2017-10-18 ENCOUNTER — Telehealth (HOSPITAL_COMMUNITY): Payer: Self-pay | Admitting: *Deleted

## 2017-10-18 NOTE — Telephone Encounter (Signed)
Advanced Heart Failure Triage Encounter  Patient Name: Nina Howell  Date of Call: 10/18/17  Problem:  Patient called complaining of having acid reflux and relating it to trouble swallowing potassium pills. Says "they don't go all the way down."  Plan:  I asked if she could try taking them with applesauce and she said she would. Also advised her to go to a local pharmacy and ask them for a safe OTC acid reflux medicine and make sure to take it 1 hour before or after her other medications.  She said she would and no further questions.     Darron Doom, RN

## 2017-10-21 DIAGNOSIS — E119 Type 2 diabetes mellitus without complications: Secondary | ICD-10-CM | POA: Diagnosis not present

## 2017-10-21 DIAGNOSIS — E039 Hypothyroidism, unspecified: Secondary | ICD-10-CM | POA: Diagnosis not present

## 2017-10-21 DIAGNOSIS — K7460 Unspecified cirrhosis of liver: Secondary | ICD-10-CM | POA: Diagnosis not present

## 2017-10-21 DIAGNOSIS — L120 Bullous pemphigoid: Secondary | ICD-10-CM | POA: Diagnosis not present

## 2017-10-21 DIAGNOSIS — G4733 Obstructive sleep apnea (adult) (pediatric): Secondary | ICD-10-CM | POA: Diagnosis not present

## 2017-10-21 DIAGNOSIS — I110 Hypertensive heart disease with heart failure: Secondary | ICD-10-CM | POA: Diagnosis not present

## 2017-10-21 DIAGNOSIS — Z7951 Long term (current) use of inhaled steroids: Secondary | ICD-10-CM | POA: Diagnosis not present

## 2017-10-21 DIAGNOSIS — F329 Major depressive disorder, single episode, unspecified: Secondary | ICD-10-CM | POA: Diagnosis not present

## 2017-10-21 DIAGNOSIS — I272 Pulmonary hypertension, unspecified: Secondary | ICD-10-CM | POA: Diagnosis not present

## 2017-10-21 DIAGNOSIS — E662 Morbid (severe) obesity with alveolar hypoventilation: Secondary | ICD-10-CM | POA: Diagnosis not present

## 2017-10-21 DIAGNOSIS — I872 Venous insufficiency (chronic) (peripheral): Secondary | ICD-10-CM | POA: Diagnosis not present

## 2017-10-21 DIAGNOSIS — J449 Chronic obstructive pulmonary disease, unspecified: Secondary | ICD-10-CM | POA: Diagnosis not present

## 2017-10-21 DIAGNOSIS — I5032 Chronic diastolic (congestive) heart failure: Secondary | ICD-10-CM | POA: Diagnosis not present

## 2017-10-22 ENCOUNTER — Telehealth (HOSPITAL_COMMUNITY): Payer: Self-pay

## 2017-10-22 ENCOUNTER — Telehealth: Payer: Self-pay | Admitting: Licensed Clinical Social Worker

## 2017-10-22 NOTE — Telephone Encounter (Signed)
CSW received call from patient who shared that she is feeling overwhelmed with appointments. Patient stated that she has an appointment tomorrow and understood at last clinic visit that she would not be seen for two weeks. Patient requesting to cancel appointment as it will only be one week and she will make appointment for next week.  CSW checked with Oda Kilts, PA to confirm ok to wait for next week's appointment. Patient mentioned paramedicine program and CSW followed up with referral to initiate a visit hopefully by week's end. Patient grateful for the assistance and supportive call. CSW available as needed and will follow up on next week's visit. Nina Howell, [name], [name]

## 2017-10-22 NOTE — Telephone Encounter (Signed)
Pt was referred to paramedicine program, I called home number and left VM for a return call back and I also called the mobile number listed and lady that answered the phone advised I had wrong number.   [name], EMT-Paramedic  10/22/17

## 2017-10-23 ENCOUNTER — Encounter (HOSPITAL_COMMUNITY): Payer: BLUE CROSS/BLUE SHIELD

## 2017-10-23 ENCOUNTER — Other Ambulatory Visit (HOSPITAL_COMMUNITY): Payer: Self-pay

## 2017-10-23 NOTE — Progress Notes (Signed)
Paramedicine Encounter    Patient ID: Nina Howell, female    DOB: 01/20/1941, 77 y.o.   MRN: [id number]   Patient Care Team: Leeroy Cha, MD as PCP - General (Internal Medicine) Delrae Rend, MD as Consulting Physician (Internal Medicine)  Patient Active Problem List   Diagnosis Date Noted  . Chronic respiratory failure with hypoxia ([place]) 04/03/2017  . Pericardial effusion 11/23/2015  . Chronic diastolic heart failure ([place]) 11/23/2015  . Pulmonary hypertension ([place]) 09/06/2015  . Acute on chronic congestive heart failure ([place])   . Rash and nonspecific skin eruption 11/27/2014  . Post-menopausal bleeding 06/18/2011  . Varicose veins 04/25/2011  . Unspecified venous (peripheral) insufficiency 04/25/2011  . Hemangioma of liver, left side 03/23/2011  . HYPOTHYROIDISM 04/27/2010  . Type II or unspecified type diabetes mellitus without mention of complication, not stated as uncontrolled 04/27/2010  . HYPOKALEMIA 04/27/2010  . Morbid obesity ([place]) 04/27/2010  . DEPRESSION 04/27/2010  . Obstructive sleep apnea 04/27/2010  . Essential hypertension 04/27/2010  . Congestive heart failure ([place]) 04/27/2010  . Obesity hypoventilation syndrome ([place]) 04/27/2010    Current Outpatient Medications:  .  acetaminophen (TYLENOL) 500 MG tablet, Take 1,000 mg every 8 (eight) hours as needed by mouth for mild pain, moderate pain, fever or headache., Disp: , Rfl:  .  ALPRAZolam (XANAX) 0.5 MG tablet, Take 0.5 mg by mouth daily as needed for anxiety., Disp: , Rfl:  .  aspirin 81 MG tablet, Take 81 mg at bedtime by mouth. , Disp: , Rfl:  .  atorvastatin (LIPITOR) 20 MG tablet, Take 20 mg by mouth every other day., Disp: , Rfl:  .  Cholecalciferol (VITAMIN D) 2000 units tablet, Take 2,000 Units daily by mouth., Disp: , Rfl:  .  Cranberry 400 MG TABS, Take 1,600 mg daily by mouth., Disp: , Rfl:  .  fluticasone furoate-vilanterol (BREO ELLIPTA) 200-25 MCG/INH AEPB, Inhale 1 puff into the  lungs daily., Disp: 1 each, Rfl: 3 .  fluticasone furoate-vilanterol (BREO ELLIPTA) 200-25 MCG/INH AEPB, Inhale 1 puff into the lungs daily., Disp: 1 each, Rfl: 0 .  insulin NPH-regular Human (NOVOLIN 70/30) (70-30) 100 UNIT/ML injection, Inject 2 Units into the skin daily with supper. Takes 20 UNITS IN AM, Disp: , Rfl:  .  levothyroxine (SYNTHROID, LEVOTHROID) 50 MCG tablet, Take 50 mcg by mouth daily.  , Disp: , Rfl:  .  lidocaine (XYLOCAINE) 5 % ointment, Apply 1 application 2 (two) times daily topically. , Disp: , Rfl:  .  Liraglutide (VICTOZA) 18 MG/3ML SOPN, Inject 1.8 mg into the skin daily., Disp: , Rfl:  .  metFORMIN (GLUCOPHAGE) 1000 MG tablet, Take 1,000 mg by mouth 2 (two) times daily with a meal., Disp: , Rfl:  .  metolazone (ZAROXOLYN) 2.5 MG tablet, Take 1 tablet (2.5 mg total) by mouth as needed. Take only as directed by CHF Clinic, Disp: 5 tablet, Rfl: 3 .  Multiple Vitamin (MULTIVITAMIN WITH MINERALS) TABS tablet, Take 0.5 tablets by mouth daily., Disp: , Rfl:  .  niacinamide 500 MG tablet, Take 500 mg by mouth 2 (two) times daily with a meal., Disp: , Rfl:  .  polyethylene glycol (MIRALAX / GLYCOLAX) packet, Take 17 g by mouth daily as needed., Disp: 14 each, Rfl: 0 .  potassium chloride SA (K-DUR,KLOR-CON) 20 MEQ tablet, Take 40 mEq by mouth 2 (two) times daily., Disp: , Rfl:  .  PROAIR HFA 108 (90 Base) MCG/ACT inhaler, INHALE TWO PUFFS BY MOUTH EVERY 4 HOURS AS NEEDED FOR  WHEEZING OR FOR SHORTNESS OF BREATH, Disp: 1 Inhaler, Rfl: 2 .  sertraline (ZOLOFT) 50 MG tablet, Take 100 mg by mouth at bedtime. , Disp: , Rfl:  .  spironolactone (ALDACTONE) 25 MG tablet, Take 1 tablet (25 mg total) by mouth daily. Please call [phone] to schedule appointment for additional refills thanks., Disp: 15 tablet, Rfl: 0 .  torsemide (DEMADEX) 20 MG tablet, Take 4 tablets (80 mg total) by mouth 2 (two) times daily., Disp: 240 tablet, Rfl: 3 .  Cyanocobalamin (VITAMIN B-12) 2500 MCG SUBL, Place  1 tablet under the tongue daily., Disp: , Rfl:  .  guaiFENesin (MUCINEX) 600 MG 12 hr tablet, Take 1,200 mg 2 (two) times daily by mouth. Started 10/30, Disp: , Rfl:  Allergies  Allergen Reactions  . Celebrex [Celecoxib] Swelling  . Penicillins Rash    Has patient had a PCN reaction causing immediate rash, facial/tongue/throat swelling, SOB or lightheadedness with hypotension: Yes Has patient had a PCN reaction causing severe rash involving mucus membranes or skin necrosis: Yes Has patient had a PCN reaction that required hospitalization: No Has patient had a PCN reaction occurring within the last 10 years: Yes - Okay taking other cillin's If all of the above answers are "NO", then may proceed with Cephalosporin use.   . Cephalexin Hives  . Lasix [Furosemide] Hives  . Oxycodone-Acetaminophen Nausea Only  . Cephalosporins Rash  . Oxycodone Anxiety    Felt weird      Social History   Socioeconomic History  . Marital status: Married    Spouse name: Not on file  . Number of children: 4  . Years of education: Not on file  . Highest education level: Not on file  Occupational History  . 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 Financial resource strain: Not on file  . Food insecurity:    Worry: Not on file    Inability: Not on file  . Transportation needs:    Medical: Not on file    Non-medical: Not on file  Tobacco Use  . Smoking status: Never Smoker  . Smokeless tobacco: Never Used  Substance and Sexual Activity  . Alcohol use: No  . Drug use: No  . Sexual activity: Yes    Birth control/protection: IUD  Lifestyle  . Physical activity:    Days per week: Not on file    Minutes per session: Not on file  . Stress: Not on file  Relationships  . Social connections:    Talks on phone: Not on file    Gets together: Not on file    Attends religious service: Not on file    Active member of club or organization: Not on file     Attends meetings of clubs or organizations: Not on file    Relationship status: Not on file  . Intimate partner violence:    Fear of current or ex partner: Not on file    Emotionally abused: Not on file    Physically abused: Not on file    Forced sexual activity: Not on file  Other Topics Concern  . Not on file  Social History Narrative  . Not on file    Physical Exam      Future Appointments  Date Time Provider [place]  10/30/2017  9:00 AM [facility] PA/NP [facility] None    BP (!) 75/6 Comment: systolic  Pulse 96   Resp 16   Wt  289 lb (131.1 kg)   SpO2 99%   BMI 54.61 kg/m  Monday-287 Weight yesterday-291 Weight @ clinic-298 CBG PTA-121  First home visit with pt, she lives with husband-he still works at [employer]-she feels like her husband is Administrator, arts as he is very forgetful. Pt is a retired Education officer, museum. He is her transportation to appointments and then when she gets back home she has to call ambulance and gets about 4 medics to come out to help her upstairs. Husband also picks up her meds at [facility]  She feels very limited with her health and movement at this time, she has stairs that she has extreme issues with that.  She has [facility] health [facility] and she has home aide agency [facility] to come out.  She has trouble with sleeping as she has itching to that. She has a lot of anxiety with these health issues as she has always been independent.   She has telemonitoring equipment.  Dr. Buddy Duty is her PCP.  She doesn't take all her morning/evening pills at same time--she has to take some time and a break to swallow them down. She states sometimes the pills get hung up.  She was prescribed metolazone on 5/21 and she has taken 4 of them since then.  She has written down she took it on 5/22, 5/24, 5/28, and 6/4-she took it with extra potassium.  meds verified and pill box checked however she uses a different method to do  her dosing-she places some doses in empty pill bottles to take.... Spoke with her about fluid restriction-she drinks more fluid tan 2L.  She has a daughter to help her at times to help meal prep--but sometimes that doesn't happen so her sodium intake is not low. She was munching on sea salted nuts when I arrived. I contacted clinic about her taking 4 metolazone pills since 5/22.    Nina Howell, [facility] Paramedic  10/23/17

## 2017-10-24 DIAGNOSIS — L120 Bullous pemphigoid: Secondary | ICD-10-CM | POA: Diagnosis not present

## 2017-10-24 DIAGNOSIS — J449 Chronic obstructive pulmonary disease, unspecified: Secondary | ICD-10-CM | POA: Diagnosis not present

## 2017-10-24 DIAGNOSIS — I509 Heart failure, unspecified: Secondary | ICD-10-CM | POA: Diagnosis not present

## 2017-10-24 DIAGNOSIS — L989 Disorder of the skin and subcutaneous tissue, unspecified: Secondary | ICD-10-CM | POA: Diagnosis not present

## 2017-10-28 ENCOUNTER — Telehealth (HOSPITAL_COMMUNITY): Payer: Self-pay | Admitting: *Deleted

## 2017-10-28 DIAGNOSIS — I5032 Chronic diastolic (congestive) heart failure: Secondary | ICD-10-CM | POA: Diagnosis not present

## 2017-10-28 NOTE — Telephone Encounter (Signed)
Pts husband left VM requesting a return call. I called the preferred number for [place] and no answer.

## 2017-10-30 ENCOUNTER — Other Ambulatory Visit (HOSPITAL_COMMUNITY): Payer: Self-pay

## 2017-10-30 ENCOUNTER — Encounter (HOSPITAL_COMMUNITY): Payer: BLUE CROSS/BLUE SHIELD

## 2017-10-30 ENCOUNTER — Telehealth (HOSPITAL_COMMUNITY): Payer: Self-pay | Admitting: *Deleted

## 2017-10-30 ENCOUNTER — Other Ambulatory Visit (HOSPITAL_COMMUNITY): Payer: Self-pay | Admitting: Internal Medicine

## 2017-10-30 DIAGNOSIS — E662 Morbid (severe) obesity with alveolar hypoventilation: Secondary | ICD-10-CM | POA: Diagnosis not present

## 2017-10-30 DIAGNOSIS — I110 Hypertensive heart disease with heart failure: Secondary | ICD-10-CM | POA: Diagnosis not present

## 2017-10-30 DIAGNOSIS — J449 Chronic obstructive pulmonary disease, unspecified: Secondary | ICD-10-CM | POA: Diagnosis not present

## 2017-10-30 DIAGNOSIS — I272 Pulmonary hypertension, unspecified: Secondary | ICD-10-CM | POA: Diagnosis not present

## 2017-10-30 DIAGNOSIS — L120 Bullous pemphigoid: Secondary | ICD-10-CM | POA: Diagnosis not present

## 2017-10-30 DIAGNOSIS — Z7951 Long term (current) use of inhaled steroids: Secondary | ICD-10-CM | POA: Diagnosis not present

## 2017-10-30 DIAGNOSIS — E119 Type 2 diabetes mellitus without complications: Secondary | ICD-10-CM | POA: Diagnosis not present

## 2017-10-30 DIAGNOSIS — K7460 Unspecified cirrhosis of liver: Secondary | ICD-10-CM | POA: Diagnosis not present

## 2017-10-30 DIAGNOSIS — F329 Major depressive disorder, single episode, unspecified: Secondary | ICD-10-CM | POA: Diagnosis not present

## 2017-10-30 DIAGNOSIS — E039 Hypothyroidism, unspecified: Secondary | ICD-10-CM | POA: Diagnosis not present

## 2017-10-30 DIAGNOSIS — I872 Venous insufficiency (chronic) (peripheral): Secondary | ICD-10-CM | POA: Diagnosis not present

## 2017-10-30 DIAGNOSIS — I5032 Chronic diastolic (congestive) heart failure: Secondary | ICD-10-CM | POA: Diagnosis not present

## 2017-10-30 DIAGNOSIS — G4733 Obstructive sleep apnea (adult) (pediatric): Secondary | ICD-10-CM | POA: Diagnosis not present

## 2017-10-30 NOTE — Progress Notes (Signed)
Paramedicine Encounter    Patient ID: Nina Howell, female    DOB: [birth year], 77 y.o.   MRN: [id number]   Patient Care Team: Leeroy Cha, MD as PCP - General (Internal Medicine) Larey Dresser, MD as PCP - Cardiology (Cardiology) Delrae Rend, MD as Consulting Physician (Internal Medicine)  Patient Active Problem List   Diagnosis Date Noted  . Chronic respiratory failure with hypoxia ([place]) 04/03/2017  . Pericardial effusion 11/23/2015  . Chronic diastolic heart failure ([place]) 11/23/2015  . Pulmonary hypertension ([place]) 09/06/2015  . Acute on chronic congestive heart failure ([place])   . Rash and nonspecific skin eruption 11/27/2014  . Post-menopausal bleeding 06/18/2011  . Varicose veins 04/25/2011  . Unspecified venous (peripheral) insufficiency 04/25/2011  . Hemangioma of liver, left side 03/23/2011  . HYPOTHYROIDISM 04/27/2010  . Type II or unspecified type diabetes mellitus without mention of complication, not stated as uncontrolled 04/27/2010  . HYPOKALEMIA 04/27/2010  . Morbid obesity ([place]) 04/27/2010  . DEPRESSION 04/27/2010  . Obstructive sleep apnea 04/27/2010  . Essential hypertension 04/27/2010  . Congestive heart failure ([place]) 04/27/2010  . Obesity hypoventilation syndrome ([place]) 04/27/2010    Current Outpatient Medications:  .  acetaminophen (TYLENOL) 500 MG tablet, Take 1,000 mg every 8 (eight) hours as needed by mouth for mild pain, moderate pain, fever or headache., Disp: , Rfl:  .  ALPRAZolam (XANAX) 0.5 MG tablet, Take 0.5 mg by mouth daily as needed for anxiety., Disp: , Rfl:  .  aspirin 81 MG tablet, Take 81 mg at bedtime by mouth. , Disp: , Rfl:  .  atorvastatin (LIPITOR) 20 MG tablet, Take 20 mg by mouth every other day., Disp: , Rfl:  .  Cholecalciferol (VITAMIN D) 2000 units tablet, Take 2,000 Units daily by mouth., Disp: , Rfl:  .  Cranberry 400 MG TABS, Take 1,600 mg daily by mouth., Disp: , Rfl:  .  Cyanocobalamin (VITAMIN B-12) 2500  MCG SUBL, Place 1 tablet under the tongue daily., Disp: , Rfl:  .  fluticasone furoate-vilanterol (BREO ELLIPTA) 200-25 MCG/INH AEPB, Inhale 1 puff into the lungs daily., Disp: 1 each, Rfl: 3 .  insulin NPH-regular Human (NOVOLIN 70/30) (70-30) 100 UNIT/ML injection, Inject 2 Units into the skin daily with supper. Takes 20 UNITS IN AM, Disp: , Rfl:  .  levothyroxine (SYNTHROID, LEVOTHROID) 50 MCG tablet, Take 50 mcg by mouth daily.  , Disp: , Rfl:  .  lidocaine (XYLOCAINE) 5 % ointment, Apply 1 application 2 (two) times daily topically. , Disp: , Rfl:  .  Liraglutide (VICTOZA) 18 MG/3ML SOPN, Inject 1.8 mg into the skin daily., Disp: , Rfl:  .  metFORMIN (GLUCOPHAGE) 1000 MG tablet, Take 1,000 mg by mouth 2 (two) times daily with a meal., Disp: , Rfl:  .  Multiple Vitamin (MULTIVITAMIN WITH MINERALS) TABS tablet, Take 0.5 tablets by mouth daily., Disp: , Rfl:  .  niacinamide 500 MG tablet, Take 500 mg by mouth 2 (two) times daily with a meal., Disp: , Rfl:  .  potassium chloride SA (K-DUR,KLOR-CON) 20 MEQ tablet, Take 40 mEq by mouth 2 (two) times daily., Disp: , Rfl:  .  PROAIR HFA 108 (90 Base) MCG/ACT inhaler, INHALE TWO PUFFS BY MOUTH EVERY 4 HOURS AS NEEDED FOR WHEEZING OR FOR SHORTNESS OF BREATH, Disp: 1 Inhaler, Rfl: 2 .  sertraline (ZOLOFT) 50 MG tablet, Take 100 mg by mouth at bedtime. , Disp: , Rfl:  .  spironolactone (ALDACTONE) 25 MG tablet, Take 1 tablet (25 mg total)  by mouth daily. Please call [phone] to schedule appointment for additional refills thanks., Disp: 15 tablet, Rfl: 0 .  torsemide (DEMADEX) 20 MG tablet, Take 4 tablets (80 mg total) by mouth 2 (two) times daily., Disp: 240 tablet, Rfl: 3 .  fluticasone furoate-vilanterol (BREO ELLIPTA) 200-25 MCG/INH AEPB, Inhale 1 puff into the lungs daily., Disp: 1 each, Rfl: 0 .  guaiFENesin (MUCINEX) 600 MG 12 hr tablet, Take 1,200 mg 2 (two) times daily by mouth. Started 10/30, Disp: , Rfl:  .  metolazone (ZAROXOLYN) 2.5 MG  tablet, Take 1 tablet (2.5 mg total) by mouth as needed. Take only as directed by [facility]F Clinic (Patient not taking: Reported on 10/30/2017), Disp: 5 tablet, Rfl: 3 .  polyethylene glycol (MIRALAX / GLYCOLAX) packet, Take 17 g by mouth daily as needed. (Patient not taking: Reported on 10/30/2017), Disp: 14 each, Rfl: 0 Allergies  Allergen Reactions  . Celebrex [Celecoxib] Swelling  . Penicillins Rash    Has patient had a PCN reaction causing immediate rash, facial/tongue/throat swelling, SOB or lightheadedness with hypotension: Yes Has patient had a PCN reaction causing severe rash involving mucus membranes or skin necrosis: Yes Has patient had a PCN reaction that required hospitalization: No Has patient had a PCN reaction occurring within the last 10 years: Yes - Okay taking other cillin's If all of the above answers are "NO", then may proceed with Cephalosporin use.   . Cephalexin Hives  . Lasix [Furosemide] Hives  . Oxycodone-Acetaminophen Nausea Only  . Cephalosporins Rash  . Oxycodone Anxiety    Felt weird      Social History   Socioeconomic History  . Marital status: Married    Spouse name: Not on file  . Number of children: 4  . Years of education: Not on file  . Highest education level: Not on file  Occupational History  . 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 Financial resource strain: Not on file  . Food insecurity:    Worry: Not on file    Inability: Not on file  . Transportation needs:    Medical: Not on file    Non-medical: Not on file  Tobacco Use  . Smoking status: Never Smoker  . Smokeless tobacco: Never Used  Substance and Sexual Activity  . Alcohol use: No  . Drug use: No  . Sexual activity: Yes    Birth control/protection: IUD  Lifestyle  . Physical activity:    Days per week: Not on file    Minutes per session: Not on file  . Stress: Not on file  Relationships  . Social connections:    Talks on  phone: Not on file    Gets together: Not on file    Attends religious service: Not on file    Active member of club or organization: Not on file    Attends meetings of clubs or organizations: Not on file    Relationship status: Not on file  . Intimate partner violence:    Fear of current or ex partner: Not on file    Emotionally abused: Not on file    Physically abused: Not on file    Forced sexual activity: Not on file  Other Topics Concern  . Not on file  Social History Narrative  . Not on file    Physical Exam      No future appointments.  BP (!) 106/0   Pulse 92   Resp  14   Wt 287 lb (130.2 kg)   SpO2 98%   BMI 54.23 kg/m   Weight yesterday-287 Last visit weight-289 CBG PTA-122 CBG EMS-165   Pt reports this morning she had called triage clinic about her feet swelling but is very upset about nobody calling her back. She is adamantly against moving her bedroom down stairs for easier movement of going to and from doctor appointments. She reports having guests over and doesn't want to have a potty and pooping in the living area. She doesn't like going to doc offices due to her leg wounds and afraid to pick up more germs.  She c/o having multiple BM's through the day and everything she is eating and drinking is going right through her. Poor oral intake. Pt c/o feeling nauseated.  She has complaints about her metfromin--advised her to f/u with her endo doc.  She is on a few meds that could cause n/d.  She is not taking 80mg  BID of torsemide--she states she isnt swollen anymore so she isnt taking that much an since approx 5 days ago.  She is proven to be difficult to manage as she self doses and wants to disagree with providers. She has filled her own pill box.  She reports she eats oodles n noodles but without the packets.  Pt reports the nurse came today and reviewed her blood work from yesterday and said her creatinine and potassium was elevated but she couldn't tell me  the numbers.  She wanted me to call clinic and and see if they have them yet--4.3- K  Spoke to triage clinic and advised them of med change per patient self dosing.    [name], [facility] Paramedic  10/30/17

## 2017-10-30 NOTE — Telephone Encounter (Signed)
Katie called to get results of patients last labs and to report patient has only been taking torsemide 40mg  bid. Pt refuses to increase her dose to 80mg  bid as prescribed by provider.

## 2017-11-04 DIAGNOSIS — J449 Chronic obstructive pulmonary disease, unspecified: Secondary | ICD-10-CM | POA: Diagnosis not present

## 2017-11-04 DIAGNOSIS — L120 Bullous pemphigoid: Secondary | ICD-10-CM | POA: Diagnosis not present

## 2017-11-04 DIAGNOSIS — G4733 Obstructive sleep apnea (adult) (pediatric): Secondary | ICD-10-CM | POA: Diagnosis not present

## 2017-11-04 DIAGNOSIS — I110 Hypertensive heart disease with heart failure: Secondary | ICD-10-CM | POA: Diagnosis not present

## 2017-11-04 DIAGNOSIS — E119 Type 2 diabetes mellitus without complications: Secondary | ICD-10-CM | POA: Diagnosis not present

## 2017-11-04 DIAGNOSIS — I5032 Chronic diastolic (congestive) heart failure: Secondary | ICD-10-CM | POA: Diagnosis not present

## 2017-11-04 DIAGNOSIS — E039 Hypothyroidism, unspecified: Secondary | ICD-10-CM | POA: Diagnosis not present

## 2017-11-04 DIAGNOSIS — Z7951 Long term (current) use of inhaled steroids: Secondary | ICD-10-CM | POA: Diagnosis not present

## 2017-11-04 DIAGNOSIS — E662 Morbid (severe) obesity with alveolar hypoventilation: Secondary | ICD-10-CM | POA: Diagnosis not present

## 2017-11-04 DIAGNOSIS — F329 Major depressive disorder, single episode, unspecified: Secondary | ICD-10-CM | POA: Diagnosis not present

## 2017-11-04 DIAGNOSIS — I872 Venous insufficiency (chronic) (peripheral): Secondary | ICD-10-CM | POA: Diagnosis not present

## 2017-11-04 DIAGNOSIS — K7460 Unspecified cirrhosis of liver: Secondary | ICD-10-CM | POA: Diagnosis not present

## 2017-11-04 DIAGNOSIS — I272 Pulmonary hypertension, unspecified: Secondary | ICD-10-CM | POA: Diagnosis not present

## 2017-11-05 ENCOUNTER — Other Ambulatory Visit (HOSPITAL_COMMUNITY): Payer: Self-pay | Admitting: Internal Medicine

## 2017-11-05 DIAGNOSIS — I5040 Unspecified combined systolic (congestive) and diastolic (congestive) heart failure: Secondary | ICD-10-CM | POA: Diagnosis not present

## 2017-11-08 DIAGNOSIS — I872 Venous insufficiency (chronic) (peripheral): Secondary | ICD-10-CM | POA: Diagnosis not present

## 2017-11-08 DIAGNOSIS — G4733 Obstructive sleep apnea (adult) (pediatric): Secondary | ICD-10-CM | POA: Diagnosis not present

## 2017-11-08 DIAGNOSIS — Z7951 Long term (current) use of inhaled steroids: Secondary | ICD-10-CM | POA: Diagnosis not present

## 2017-11-08 DIAGNOSIS — E119 Type 2 diabetes mellitus without complications: Secondary | ICD-10-CM | POA: Diagnosis not present

## 2017-11-08 DIAGNOSIS — J449 Chronic obstructive pulmonary disease, unspecified: Secondary | ICD-10-CM | POA: Diagnosis not present

## 2017-11-08 DIAGNOSIS — F329 Major depressive disorder, single episode, unspecified: Secondary | ICD-10-CM | POA: Diagnosis not present

## 2017-11-08 DIAGNOSIS — I110 Hypertensive heart disease with heart failure: Secondary | ICD-10-CM | POA: Diagnosis not present

## 2017-11-08 DIAGNOSIS — E662 Morbid (severe) obesity with alveolar hypoventilation: Secondary | ICD-10-CM | POA: Diagnosis not present

## 2017-11-08 DIAGNOSIS — I272 Pulmonary hypertension, unspecified: Secondary | ICD-10-CM | POA: Diagnosis not present

## 2017-11-08 DIAGNOSIS — K7460 Unspecified cirrhosis of liver: Secondary | ICD-10-CM | POA: Diagnosis not present

## 2017-11-08 DIAGNOSIS — E039 Hypothyroidism, unspecified: Secondary | ICD-10-CM | POA: Diagnosis not present

## 2017-11-08 DIAGNOSIS — I5032 Chronic diastolic (congestive) heart failure: Secondary | ICD-10-CM | POA: Diagnosis not present

## 2017-11-08 DIAGNOSIS — L120 Bullous pemphigoid: Secondary | ICD-10-CM | POA: Diagnosis not present

## 2017-11-10 ENCOUNTER — Other Ambulatory Visit: Payer: Self-pay

## 2017-11-10 ENCOUNTER — Inpatient Hospital Stay (HOSPITAL_COMMUNITY)
Admission: EM | Admit: 2017-11-10 | Discharge: 2017-11-13 | DRG: 292 | Disposition: A | Discharge status: Home Health | Payer: BLUE CROSS/BLUE SHIELD | Attending: Cardiovascular Disease | Admitting: Cardiovascular Disease

## 2017-11-10 ENCOUNTER — Emergency Department (HOSPITAL_COMMUNITY): Payer: BLUE CROSS/BLUE SHIELD

## 2017-11-10 DIAGNOSIS — I509 Heart failure, unspecified: Secondary | ICD-10-CM | POA: Diagnosis not present

## 2017-11-10 DIAGNOSIS — S71102A Unspecified open wound, left thigh, initial encounter: Secondary | ICD-10-CM | POA: Diagnosis present

## 2017-11-10 DIAGNOSIS — R0602 Shortness of breath: Secondary | ICD-10-CM | POA: Diagnosis not present

## 2017-11-10 DIAGNOSIS — S40021A Contusion of right upper arm, initial encounter: Secondary | ICD-10-CM | POA: Diagnosis present

## 2017-11-10 DIAGNOSIS — Z79899 Other long term (current) drug therapy: Secondary | ICD-10-CM | POA: Diagnosis not present

## 2017-11-10 DIAGNOSIS — K7460 Unspecified cirrhosis of liver: Secondary | ICD-10-CM | POA: Diagnosis present

## 2017-11-10 DIAGNOSIS — E039 Hypothyroidism, unspecified: Secondary | ICD-10-CM | POA: Diagnosis not present

## 2017-11-10 DIAGNOSIS — Z880 Allergy status to penicillin: Secondary | ICD-10-CM | POA: Diagnosis not present

## 2017-11-10 DIAGNOSIS — Z888 Allergy status to other drugs, medicaments and biological substances status: Secondary | ICD-10-CM

## 2017-11-10 DIAGNOSIS — X58XXXA Exposure to other specified factors, initial encounter: Secondary | ICD-10-CM | POA: Diagnosis present

## 2017-11-10 DIAGNOSIS — Z806 Family history of leukemia: Secondary | ICD-10-CM | POA: Diagnosis not present

## 2017-11-10 DIAGNOSIS — E785 Hyperlipidemia, unspecified: Secondary | ICD-10-CM | POA: Diagnosis not present

## 2017-11-10 DIAGNOSIS — J449 Chronic obstructive pulmonary disease, unspecified: Secondary | ICD-10-CM | POA: Diagnosis present

## 2017-11-10 DIAGNOSIS — T501X6A Underdosing of loop [high-ceiling] diuretics, initial encounter: Secondary | ICD-10-CM | POA: Diagnosis present

## 2017-11-10 DIAGNOSIS — I5043 Acute on chronic combined systolic (congestive) and diastolic (congestive) heart failure: Secondary | ICD-10-CM

## 2017-11-10 DIAGNOSIS — E119 Type 2 diabetes mellitus without complications: Secondary | ICD-10-CM | POA: Diagnosis present

## 2017-11-10 DIAGNOSIS — E871 Hypo-osmolality and hyponatremia: Secondary | ICD-10-CM | POA: Diagnosis present

## 2017-11-10 DIAGNOSIS — N179 Acute kidney failure, unspecified: Secondary | ICD-10-CM | POA: Diagnosis not present

## 2017-11-10 DIAGNOSIS — I493 Ventricular premature depolarization: Secondary | ICD-10-CM | POA: Diagnosis not present

## 2017-11-10 DIAGNOSIS — I272 Pulmonary hypertension, unspecified: Secondary | ICD-10-CM | POA: Diagnosis present

## 2017-11-10 DIAGNOSIS — I110 Hypertensive heart disease with heart failure: Secondary | ICD-10-CM | POA: Diagnosis not present

## 2017-11-10 DIAGNOSIS — I5033 Acute on chronic diastolic (congestive) heart failure: Secondary | ICD-10-CM | POA: Diagnosis not present

## 2017-11-10 DIAGNOSIS — Z833 Family history of diabetes mellitus: Secondary | ICD-10-CM

## 2017-11-10 DIAGNOSIS — L8990 Pressure ulcer of unspecified site, unspecified stage: Secondary | ICD-10-CM | POA: Diagnosis present

## 2017-11-10 DIAGNOSIS — Z885 Allergy status to narcotic agent status: Secondary | ICD-10-CM

## 2017-11-10 DIAGNOSIS — Z6841 Body Mass Index (BMI) 40.0 and over, adult: Secondary | ICD-10-CM

## 2017-11-10 DIAGNOSIS — Z91128 Patient's intentional underdosing of medication regimen for other reason: Secondary | ICD-10-CM

## 2017-11-10 DIAGNOSIS — Z7982 Long term (current) use of aspirin: Secondary | ICD-10-CM | POA: Diagnosis not present

## 2017-11-10 DIAGNOSIS — Z794 Long term (current) use of insulin: Secondary | ICD-10-CM

## 2017-11-10 DIAGNOSIS — L989 Disorder of the skin and subcutaneous tissue, unspecified: Secondary | ICD-10-CM | POA: Diagnosis not present

## 2017-11-10 DIAGNOSIS — E876 Hypokalemia: Secondary | ICD-10-CM | POA: Diagnosis present

## 2017-11-10 DIAGNOSIS — I5082 Biventricular heart failure: Secondary | ICD-10-CM

## 2017-11-10 DIAGNOSIS — I8390 Asymptomatic varicose veins of unspecified lower extremity: Secondary | ICD-10-CM | POA: Diagnosis present

## 2017-11-10 DIAGNOSIS — G4733 Obstructive sleep apnea (adult) (pediatric): Secondary | ICD-10-CM | POA: Diagnosis present

## 2017-11-10 DIAGNOSIS — L120 Bullous pemphigoid: Secondary | ICD-10-CM | POA: Diagnosis not present

## 2017-11-10 DIAGNOSIS — Z975 Presence of (intrauterine) contraceptive device: Secondary | ICD-10-CM

## 2017-11-10 DIAGNOSIS — S40022A Contusion of left upper arm, initial encounter: Secondary | ICD-10-CM | POA: Diagnosis present

## 2017-11-10 DIAGNOSIS — I5023 Acute on chronic systolic (congestive) heart failure: Secondary | ICD-10-CM | POA: Diagnosis not present

## 2017-11-10 LAB — CBC WITH DIFFERENTIAL/PLATELET
Abs Immature Granulocytes: 0 10*3/uL (ref 0.0–0.1)
Basophils Absolute: 0 10*3/uL (ref 0.0–0.1)
Basophils Relative: 0 %
EOS ABS: 0 10*3/uL (ref 0.0–0.7)
EOS PCT: 0 %
HEMATOCRIT: 38.9 % (ref 36.0–46.0)
HEMOGLOBIN: 12.5 g/dL (ref 12.0–15.0)
Immature Granulocytes: 0 %
LYMPHS PCT: 21 %
Lymphs Abs: 1.5 10*3/uL (ref 0.7–4.0)
MCH: 29.4 pg (ref 26.0–34.0)
MCHC: 32.1 g/dL (ref 30.0–36.0)
MCV: 91.5 fL (ref 78.0–100.0)
MONO ABS: 0.8 10*3/uL (ref 0.1–1.0)
MONOS PCT: 11 %
Neutro Abs: 5 10*3/uL (ref 1.7–7.7)
Neutrophils Relative %: 68 %
Platelets: 178 10*3/uL (ref 150–400)
RBC: 4.25 MIL/uL (ref 3.87–5.11)
RDW: 15.8 % — AB (ref 11.5–15.5)
WBC: 7.3 10*3/uL (ref 4.0–10.5)

## 2017-11-10 LAB — LIPASE, BLOOD: Lipase: 30 U/L (ref 11–51)

## 2017-11-10 LAB — COMPREHENSIVE METABOLIC PANEL
ALK PHOS: 85 U/L (ref 38–126)
ALT: 41 U/L (ref 14–54)
AST: 43 U/L — AB (ref 15–41)
Albumin: 3.1 g/dL — ABNORMAL LOW (ref 3.5–5.0)
Anion gap: 13 (ref 5–15)
BUN: 43 mg/dL — AB (ref 6–20)
CALCIUM: 8.5 mg/dL — AB (ref 8.9–10.3)
CHLORIDE: 96 mmol/L — AB (ref 101–111)
CO2: 25 mmol/L (ref 22–32)
CREATININE: 1.44 mg/dL — AB (ref 0.44–1.00)
GFR calc non Af Amer: 35 mL/min — ABNORMAL LOW (ref >60)
GFR, EST AFRICAN AMERICAN: 40 mL/min — AB (ref >60)
Glucose, Bld: 186 mg/dL — ABNORMAL HIGH (ref 65–99)
Potassium: 2.9 mmol/L — ABNORMAL LOW (ref 3.5–5.1)
SODIUM: 134 mmol/L — AB (ref 135–145)
Total Bilirubin: 0.7 mg/dL (ref 0.3–1.2)
Total Protein: 6.9 g/dL (ref 6.5–8.1)

## 2017-11-10 LAB — BRAIN NATRIURETIC PEPTIDE: B Natriuretic Peptide: 2661.5 pg/mL — ABNORMAL HIGH (ref 0.0–100.0)

## 2017-11-10 LAB — I-STAT TROPONIN, ED: Troponin i, poc: 0.01 ng/mL (ref 0.00–0.08)

## 2017-11-10 NOTE — ED Triage Notes (Signed)
Hx of CHF and sleep apnea, wears 4 L at home. Abd pain since Friday. Orthopnea at baseline. On antibiotic for sores on legs and med for pancreatitis, pain occurs after she takes the meds. Open wounds noted to L thigh 100% on 4L, 188/73, CBG 155. NAD

## 2017-11-10 NOTE — ED Notes (Signed)
Called lab to add on BNP to CBC

## 2017-11-10 NOTE — ED Notes (Signed)
Patient transported to X-ray 

## 2017-11-10 NOTE — ED Provider Notes (Signed)
[facility] EMERGENCY DEPARTMENT Provider Note  CSN: [id number] Arrival date & time: 11/10/17 2214  Chief Complaint(s) Shortness of Breath and Abdominal Pain  HPI Nina Howell is a 77 y.o. female h/o CHF with last EF 30-40% on 10/2016, Pulm HTN on 4LNC here for gradually worsening DOE for the last 2-3 weeks.   Also has intermittent abd discomfort ongoing for several months followed by GI. Pain free at this time  The history is provided by the patient.  Shortness of Breath  This is a recurrent problem. Duration: 2-3 weeks. The problem occurs continuously.The problem has been gradually worsening. Associated symptoms include cough (dry), orthopnea, chest pain (exertional substernal pressure, nonradiating.) and leg swelling. Pertinent negatives include no rhinorrhea, no sputum production and no wheezing. Associated medical issues include heart failure.    Past Medical History Past Medical History:  Diagnosis Date  . Acute pancreatitis   . Anemia   . Asymptomatic cholelithiasis   . Bullous pemphigoid   . Cellulitis    ABDOMINAL WALL  . CHF (congestive heart failure) (HCC)    DECOMPENSATED SYSTOLIC CHF  . CHF (congestive heart failure) ([place])   . CTS (carpal tunnel syndrome)   . Diabetes mellitus, type 2 ([place])   . DJD (degenerative joint disease)   . Hepatic lesion   . Hyperlipidemia   . 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 Hypertension   . Hypokalemia   . Hypothyroidism   . Hypoventilation   . IUD (intrauterine device) in place   . Liver cirrhosis ([place])   . Morbid obesity ([place])   . 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 Sleep apnea    cpap  . Varicose veins    Patient Active Problem List   Diagnosis Date Noted  . Chronic respiratory failure with hypoxia ([place]) 04/03/2017  . Pericardial effusion 11/23/2015  . Chronic diastolic heart failure ([place]) 11/23/2015  .  Pulmonary hypertension ([place]) 09/06/2015  . Acute on chronic congestive heart failure ([place])   . Rash and nonspecific skin eruption 11/27/2014  . Post-menopausal bleeding 06/18/2011  . Varicose veins 04/25/2011  . Unspecified venous (peripheral) insufficiency 04/25/2011  . Hemangioma of liver, left side 03/23/2011  . HYPOTHYROIDISM 04/27/2010  . Type II or unspecified type diabetes mellitus without mention of complication, not stated as uncontrolled 04/27/2010  . HYPOKALEMIA 04/27/2010  . Morbid obesity ([place]) 04/27/2010  . DEPRESSION 04/27/2010  . Obstructive sleep apnea 04/27/2010  . Essential hypertension 04/27/2010  . Congestive heart failure ([place]) 04/27/2010  . Obesity hypoventilation syndrome ([place]) 04/27/2010   Home Medication(s) Prior to Admission medications   Medication Sig Start Date End Date Taking? Authorizing Provider  acetaminophen (TYLENOL) 500 MG tablet Take 1,000 mg every 8 (eight) hours as needed by mouth for mild pain, moderate pain, fever or headache.    [provider]  ALPRAZolam Duanne Moron) 0.5 MG tablet Take 0.5 mg by mouth daily as needed for anxiety.    [provider]  aspirin 81 MG tablet Take 81 mg at bedtime by mouth.     [provider]  atorvastatin (LIPITOR) 20 MG tablet Take 20 mg by mouth every other day.    [provider]  Cholecalciferol (VITAMIN D) 2000 units tablet Take 2,000 Units daily by mouth.    [provider]  Cranberry 400 MG TABS Take 1,600 mg daily by mouth.    [provider]  Cyanocobalamin (  VITAMIN B-12) 2500 MCG SUBL Place 1 tablet under the tongue daily.    [provider]  fluticasone furoate-vilanterol (BREO ELLIPTA) 200-25 MCG/INH AEPB Inhale 1 puff into the lungs daily. 08/15/17   Rigoberto Noel, MD  fluticasone furoate-vilanterol (BREO ELLIPTA) 200-25 MCG/INH AEPB Inhale 1 puff into the lungs daily. 08/26/17   Parrett, Fonnie Mu, NP  guaiFENesin (MUCINEX) 600 MG 12 hr tablet  Take 1,200 mg 2 (two) times daily by mouth. Started 10/30    [provider]  insulin NPH-regular Human (NOVOLIN 70/30) (70-30) 100 UNIT/ML injection Inject 2 Units into the skin daily with supper. Takes 20 UNITS IN AM    [provider]  levothyroxine (SYNTHROID, LEVOTHROID) 50 MCG tablet Take 50 mcg by mouth daily.      [provider]  lidocaine (XYLOCAINE) 5 % ointment Apply 1 application 2 (two) times daily topically.     [provider]  Liraglutide (VICTOZA) 18 MG/3ML SOPN Inject 1.8 mg into the skin daily.    [provider]  metFORMIN (GLUCOPHAGE) 1000 MG tablet Take 1,000 mg by mouth 2 (two) times daily with a meal.    [provider]  metolazone (ZAROXOLYN) 2.5 MG tablet Take 1 tablet (2.5 mg total) by mouth as needed. Take only as directed by CHF Clinic Patient not taking: Reported on 10/30/2017 10/09/17   Georgiana Shore, NP  Multiple Vitamin (MULTIVITAMIN WITH MINERALS) TABS tablet Take 0.5 tablets by mouth daily.    [provider]  niacinamide 500 MG tablet Take 500 mg by mouth 2 (two) times daily with a meal.    [provider]  polyethylene glycol (MIRALAX / GLYCOLAX) packet Take 17 g by mouth daily as needed. Patient not taking: Reported on 10/30/2017 08/27/15   Jonetta Osgood, MD  potassium chloride SA (K-DUR,KLOR-CON) 20 MEQ tablet Take 40 mEq by mouth 2 (two) times daily.    [provider]  PROAIR HFA 108 (90 Base) MCG/ACT inhaler INHALE TWO PUFFS BY MOUTH EVERY 4 HOURS AS NEEDED FOR WHEEZING OR FOR SHORTNESS OF BREATH 08/20/17   Rigoberto Noel, MD  sertraline (ZOLOFT) 50 MG tablet Take 100 mg by mouth at bedtime.     [provider]  spironolactone (ALDACTONE) 25 MG tablet Take 1 tablet (25 mg total) by mouth daily. Please call [phone] to schedule appointment for additional refills thanks. 09/09/17   Josue Hector, MD  torsemide (DEMADEX) 20 MG tablet Take 4 tablets (80 mg total) by  mouth 2 (two) times daily. 10/09/17   Georgiana Shore, NP                                                                                                                                    Past Surgical History Past Surgical History:  Procedure Laterality Date  . CARDIAC CATHETERIZATION  2011   pt states "clean report"  . COLONOSCOPY WITH  PROPOFOL N/A 12/24/2014   Procedure: COLONOSCOPY WITH PROPOFOL;  Surgeon: Carol Ada, MD;  Location: [facility] ENDOSCOPY;  Service: Endoscopy;  Laterality: N/A;  . DILATION AND CURETTAGE OF UTERUS    . SHOULDER SURGERY  2009   RIGHT SHOULDER  . TUBAL LIGATION     Family History Family History  Problem Relation Age of Onset  . Cancer Mother        throat  . Diabetes Father   . Leukemia Father     Social History Social History   Tobacco Use  . Smoking status: Never Smoker  . Smokeless tobacco: Never Used  Substance Use Topics  . Alcohol use: No  . Drug use: No   Allergies Celebrex [celecoxib]; Penicillins; Cephalexin; Lasix [furosemide]; Oxycodone-acetaminophen; Cephalosporins; and Oxycodone  Review of Systems Review of Systems  HENT: Negative for rhinorrhea.   Respiratory: Positive for cough (dry) and shortness of breath. Negative for sputum production and wheezing.   Cardiovascular: Positive for chest pain (exertional substernal pressure, nonradiating.), orthopnea and leg swelling.  Skin: Positive for wound (to left thigh, managed by wound clinic).   All other systems are reviewed and are negative for acute change except as noted in the HPI  Physical Exam Vital Signs  I have reviewed the triage vital signs BP (!) 97/57   Pulse 84   Temp 97.8 F (36.6 C) (Oral)   Resp (!) 24   Ht 5\' 1"  (1.549 m)   Wt 130.2 kg (287 lb)   SpO2 100%   BMI 54.23 kg/m   Physical Exam  Constitutional: She is oriented to person, place, and time. She appears well-developed and well-nourished. No distress. Nasal cannula in place.  Morbidly obese  HENT:    Head: Normocephalic and atraumatic.  Nose: Nose normal.  Eyes: Pupils are equal, round, and reactive to light. Conjunctivae and EOM are normal. Right eye exhibits no discharge. Left eye exhibits no discharge. No scleral icterus.  Neck: Normal range of motion. Neck supple.  Cardiovascular: Normal rate and regular rhythm. Exam reveals no gallop and no friction rub.  No murmur heard. Pulmonary/Chest: Effort normal and breath sounds normal. No stridor. Tachypnea noted. No respiratory distress. She has no rales.  Abdominal: Soft. She exhibits no distension. There is no tenderness. There is no rigidity, no rebound and no guarding.  Musculoskeletal: She exhibits no edema or tenderness.  Neurological: She is alert and oriented to person, place, and time.  Skin: Skin is warm and dry. No rash noted. She is not diaphoretic. No erythema.     Psychiatric: She has a normal mood and affect.  Vitals reviewed.   ED Results and Treatments Labs (all labs ordered are listed, but only abnormal results are displayed) Labs Reviewed  COMPREHENSIVE METABOLIC PANEL - Abnormal; Notable for the following components:      Result Value   Sodium 134 (*)    Potassium 2.9 (*)    Chloride 96 (*)    Glucose, Bld 186 (*)    BUN 43 (*)    Creatinine, Ser 1.44 (*)    Calcium 8.5 (*)    Albumin 3.1 (*)    AST 43 (*)    GFR calc non Af Amer 35 (*)    GFR calc Af Amer 40 (*)    All other components within normal limits  CBC WITH DIFFERENTIAL/PLATELET - Abnormal; Notable for the following components:   RDW 15.8 (*)    All other components within normal limits  BRAIN NATRIURETIC PEPTIDE -  Abnormal; Notable for the following components:   B Natriuretic Peptide 2,661.5 (*)    All other components within normal limits  LIPASE, BLOOD  URINALYSIS, ROUTINE W REFLEX MICROSCOPIC  I-STAT TROPONIN, ED                                                                                                                          EKG  EKG Interpretation  Date/Time:  Sunday November 10 2017 22:25:28 EDT Ventricular Rate:  87 PR Interval:    QRS Duration: 116 QT Interval:  419 QTC Calculation: 505 R Axis:   -71 Text Interpretation:  Sinus rhythm Consider left atrial enlargement LAD, consider left anterior fascicular block Low voltage, precordial leads Borderline abnrm T, anterolateral leads No significant change since last tracing Confirmed by Addison Lank [phone]) on 11/10/2017 10:46:35 PM      Radiology Dg Chest 2 View  Result Date: 11/10/2017 CLINICAL DATA:  CHF and sleep apnea. Abdominal pain since Friday. Orthopnea at baseline. EXAM: CHEST - 2 VIEW COMPARISON:  04/03/2017 FINDINGS: Stable cardiomegaly with tortuous atherosclerotic aorta. No overt pulmonary edema consolidation. No effusion or pneumothorax. Osteoarthritis of the AC joints bilaterally and marked osteoarthritis with spurring the left glenohumeral joint. IMPRESSION: Cardiomegaly with aortic atherosclerosis. No active pulmonary disease. Electronically Signed   By: Ashley Royalty M.D.   On: 11/10/2017 23:35   Pertinent labs & imaging results that were available during my care of the patient were reviewed by me and considered in my medical decision making (see chart for details).  Medications Ordered in ED Medications  ALPRAZolam (XANAX) tablet 0.5 mg (has no administration in time range)  aspirin tablet 81 mg (has no administration in time range)  atorvastatin (LIPITOR) tablet 20 mg (has no administration in time range)  Vitamin D 2,000 Units (has no administration in time range)  Vitamin B-12 SUBL 2,500 mcg (has no administration in time range)  fluticasone furoate-vilanterol (BREO ELLIPTA) 200-25 MCG/INH 1 puff (has no administration in time range)  guaiFENesin (MUCINEX) 12 hr tablet 1,200 mg (has no administration in time range)  levothyroxine (SYNTHROID, LEVOTHROID) tablet 50 mcg (has no administration in time range)  sertraline (ZOLOFT) tablet 100  mg (has no administration in time range)  spironolactone (ALDACTONE) tablet 25 mg (has no administration in time range)  potassium chloride SA (K-DUR,KLOR-CON) CR tablet 40 mEq (40 mEq Oral Given 11/11/17 0121)  potassium chloride 10 mEq in 100 mL IVPB (0 mEq Intravenous Stopped 11/11/17 0228)  Procedures Procedures  (including critical care time)  Medical Decision Making / ED Course I have reviewed the nursing notes for this encounter and the patient's prior records (if available in EHR or on provided paperwork).    With presentation is consistent with CHF exacerbation.  Chest x-ray without overt pulmonary edema.  She is satting well on her 4 L nasal cannula.  BNP greater than 2500.  EKG without acute ischemic changes.  CBC without anemia.  Labs with mild hyponatremia and hypokalemia.  Also notable for mild AKI likely due to diuresing.  Case discussed with cardiology who will admit the patient for further management.  Regarding patient's abdominal complaints, these are chronic in nature and currently she is asymptomatic.  Screening labs are otherwise grossly reassuring.  UA is currently pending.  Final Clinical Impression(s) / ED Diagnoses Final diagnoses:  Acute on chronic combined systolic and diastolic congestive heart failure ([place])      This chart was dictated using voice recognition software.  Despite best efforts to proofread,  errors can occur which can change the documentation meaning.   Fatima Blank, MD 11/11/17 [phone]

## 2017-11-11 ENCOUNTER — Encounter (HOSPITAL_COMMUNITY): Payer: Self-pay

## 2017-11-11 DIAGNOSIS — Z885 Allergy status to narcotic agent status: Secondary | ICD-10-CM | POA: Diagnosis not present

## 2017-11-11 DIAGNOSIS — E871 Hypo-osmolality and hyponatremia: Secondary | ICD-10-CM | POA: Diagnosis present

## 2017-11-11 DIAGNOSIS — L8990 Pressure ulcer of unspecified site, unspecified stage: Secondary | ICD-10-CM | POA: Diagnosis present

## 2017-11-11 DIAGNOSIS — Z794 Long term (current) use of insulin: Secondary | ICD-10-CM | POA: Diagnosis not present

## 2017-11-11 DIAGNOSIS — E119 Type 2 diabetes mellitus without complications: Secondary | ICD-10-CM | POA: Diagnosis present

## 2017-11-11 DIAGNOSIS — I5043 Acute on chronic combined systolic (congestive) and diastolic (congestive) heart failure: Secondary | ICD-10-CM

## 2017-11-11 DIAGNOSIS — N179 Acute kidney failure, unspecified: Secondary | ICD-10-CM | POA: Diagnosis present

## 2017-11-11 DIAGNOSIS — I5033 Acute on chronic diastolic (congestive) heart failure: Secondary | ICD-10-CM | POA: Diagnosis present

## 2017-11-11 DIAGNOSIS — S40021A Contusion of right upper arm, initial encounter: Secondary | ICD-10-CM | POA: Diagnosis present

## 2017-11-11 DIAGNOSIS — Z7982 Long term (current) use of aspirin: Secondary | ICD-10-CM | POA: Diagnosis not present

## 2017-11-11 DIAGNOSIS — Z6841 Body Mass Index (BMI) 40.0 and over, adult: Secondary | ICD-10-CM | POA: Diagnosis not present

## 2017-11-11 DIAGNOSIS — Z79899 Other long term (current) drug therapy: Secondary | ICD-10-CM | POA: Diagnosis not present

## 2017-11-11 DIAGNOSIS — E039 Hypothyroidism, unspecified: Secondary | ICD-10-CM | POA: Diagnosis present

## 2017-11-11 DIAGNOSIS — Z888 Allergy status to other drugs, medicaments and biological substances status: Secondary | ICD-10-CM | POA: Diagnosis not present

## 2017-11-11 DIAGNOSIS — Z833 Family history of diabetes mellitus: Secondary | ICD-10-CM | POA: Diagnosis not present

## 2017-11-11 DIAGNOSIS — G4733 Obstructive sleep apnea (adult) (pediatric): Secondary | ICD-10-CM | POA: Diagnosis present

## 2017-11-11 DIAGNOSIS — I272 Pulmonary hypertension, unspecified: Secondary | ICD-10-CM | POA: Diagnosis present

## 2017-11-11 DIAGNOSIS — X58XXXA Exposure to other specified factors, initial encounter: Secondary | ICD-10-CM | POA: Diagnosis present

## 2017-11-11 DIAGNOSIS — I5023 Acute on chronic systolic (congestive) heart failure: Secondary | ICD-10-CM | POA: Diagnosis not present

## 2017-11-11 DIAGNOSIS — I509 Heart failure, unspecified: Secondary | ICD-10-CM | POA: Diagnosis not present

## 2017-11-11 DIAGNOSIS — I110 Hypertensive heart disease with heart failure: Secondary | ICD-10-CM | POA: Diagnosis present

## 2017-11-11 DIAGNOSIS — Z806 Family history of leukemia: Secondary | ICD-10-CM | POA: Diagnosis not present

## 2017-11-11 DIAGNOSIS — E876 Hypokalemia: Secondary | ICD-10-CM | POA: Diagnosis present

## 2017-11-11 DIAGNOSIS — E785 Hyperlipidemia, unspecified: Secondary | ICD-10-CM | POA: Diagnosis present

## 2017-11-11 DIAGNOSIS — S40022A Contusion of left upper arm, initial encounter: Secondary | ICD-10-CM | POA: Diagnosis present

## 2017-11-11 DIAGNOSIS — L120 Bullous pemphigoid: Secondary | ICD-10-CM | POA: Diagnosis not present

## 2017-11-11 DIAGNOSIS — I8390 Asymptomatic varicose veins of unspecified lower extremity: Secondary | ICD-10-CM | POA: Diagnosis present

## 2017-11-11 DIAGNOSIS — L989 Disorder of the skin and subcutaneous tissue, unspecified: Secondary | ICD-10-CM | POA: Diagnosis not present

## 2017-11-11 DIAGNOSIS — J449 Chronic obstructive pulmonary disease, unspecified: Secondary | ICD-10-CM | POA: Diagnosis not present

## 2017-11-11 DIAGNOSIS — I493 Ventricular premature depolarization: Secondary | ICD-10-CM | POA: Diagnosis not present

## 2017-11-11 DIAGNOSIS — Z880 Allergy status to penicillin: Secondary | ICD-10-CM | POA: Diagnosis not present

## 2017-11-11 LAB — CBC
HCT: 37.7 % (ref 36.0–46.0)
Hemoglobin: 12 g/dL (ref 12.0–15.0)
MCH: 29.3 pg (ref 26.0–34.0)
MCHC: 31.8 g/dL (ref 30.0–36.0)
MCV: 92.2 fL (ref 78.0–100.0)
PLATELETS: 165 10*3/uL (ref 150–400)
RBC: 4.09 MIL/uL (ref 3.87–5.11)
RDW: 15.8 % — AB (ref 11.5–15.5)
WBC: 7.9 10*3/uL (ref 4.0–10.5)

## 2017-11-11 LAB — CREATININE, SERUM
CREATININE: 1.55 mg/dL — AB (ref 0.44–1.00)
GFR calc Af Amer: 37 mL/min — ABNORMAL LOW (ref >60)
GFR, EST NON AFRICAN AMERICAN: 32 mL/min — AB (ref >60)

## 2017-11-11 MED ORDER — PANCRELIPASE (LIP-PROT-AMYL) 36000-114000 UNITS PO CPEP
72000.0000 [IU] | ORAL_CAPSULE | Freq: Three times a day (TID) | ORAL | Status: DC
  Administered 2017-11-11 – 2017-11-13 (×7): 72000 [IU] via ORAL
  Filled 2017-11-11 (×9): qty 2

## 2017-11-11 MED ORDER — POTASSIUM CHLORIDE 10 MEQ/100ML IV SOLN
10.0000 meq | INTRAVENOUS | Status: AC
  Administered 2017-11-11 (×4): 10 meq via INTRAVENOUS
  Filled 2017-11-11 (×3): qty 100

## 2017-11-11 MED ORDER — POTASSIUM CHLORIDE 10 MEQ/100ML IV SOLN
10.0000 meq | Freq: Once | INTRAVENOUS | Status: AC
  Administered 2017-11-11: 10 meq via INTRAVENOUS
  Filled 2017-11-11: qty 100

## 2017-11-11 MED ORDER — ENOXAPARIN SODIUM 40 MG/0.4ML ~~LOC~~ SOLN
40.0000 mg | SUBCUTANEOUS | Status: DC
  Administered 2017-11-11 – 2017-11-13 (×3): 40 mg via SUBCUTANEOUS
  Filled 2017-11-11 (×3): qty 0.4

## 2017-11-11 MED ORDER — SPIRONOLACTONE 25 MG PO TABS
50.0000 mg | ORAL_TABLET | Freq: Every day | ORAL | Status: DC
  Administered 2017-11-12 – 2017-11-13 (×2): 50 mg via ORAL
  Filled 2017-11-11 (×2): qty 2

## 2017-11-11 MED ORDER — ATORVASTATIN CALCIUM 20 MG PO TABS
20.0000 mg | ORAL_TABLET | ORAL | Status: DC
  Administered 2017-11-11 – 2017-11-13 (×2): 20 mg via ORAL
  Filled 2017-11-11 (×3): qty 1

## 2017-11-11 MED ORDER — BUMETANIDE 2 MG PO TABS
2.0000 mg | ORAL_TABLET | Freq: Two times a day (BID) | ORAL | Status: DC
  Administered 2017-11-11: 2 mg via ORAL
  Filled 2017-11-11: qty 1

## 2017-11-11 MED ORDER — ALPRAZOLAM 0.25 MG PO TABS
0.5000 mg | ORAL_TABLET | Freq: Every day | ORAL | Status: DC | PRN
  Administered 2017-11-11: 0.5 mg via ORAL
  Filled 2017-11-11: qty 2

## 2017-11-11 MED ORDER — LEVOTHYROXINE SODIUM 50 MCG PO TABS
50.0000 ug | ORAL_TABLET | Freq: Every day | ORAL | Status: DC
Start: 2017-11-11 — End: 2017-11-13
  Administered 2017-11-11 – 2017-11-13 (×3): 50 ug via ORAL
  Filled 2017-11-11 (×3): qty 1

## 2017-11-11 MED ORDER — VITAMIN B-12 1000 MCG PO TABS
2500.0000 ug | ORAL_TABLET | Freq: Every day | ORAL | Status: DC
  Administered 2017-11-11 – 2017-11-13 (×3): 2500 ug via ORAL
  Filled 2017-11-11 (×3): qty 3

## 2017-11-11 MED ORDER — ACETAMINOPHEN 325 MG PO TABS: 650.0000 mg | ORAL_TABLET | ORAL | Status: DC | PRN

## 2017-11-11 MED ORDER — SPIRONOLACTONE 25 MG PO TABS
25.0000 mg | ORAL_TABLET | Freq: Every day | ORAL | Status: DC
  Administered 2017-11-11: 25 mg via ORAL
  Filled 2017-11-11: qty 1

## 2017-11-11 MED ORDER — JUVEN PO PACK
1.0000 | PACK | Freq: Two times a day (BID) | ORAL | Status: DC
  Administered 2017-11-11 – 2017-11-13 (×3): 1 via ORAL
  Filled 2017-11-11 (×5): qty 1

## 2017-11-11 MED ORDER — GUAIFENESIN ER 600 MG PO TB12
1200.0000 mg | ORAL_TABLET | Freq: Two times a day (BID) | ORAL | Status: DC
  Filled 2017-11-11 (×5): qty 2

## 2017-11-11 MED ORDER — ONDANSETRON HCL 4 MG/2ML IJ SOLN: 4.0000 mg | Freq: Four times a day (QID) | INTRAMUSCULAR | Status: DC | PRN

## 2017-11-11 MED ORDER — FLUTICASONE FUROATE-VILANTEROL 200-25 MCG/INH IN AEPB
1.0000 | INHALATION_SPRAY | Freq: Every day | RESPIRATORY_TRACT | Status: DC
  Administered 2017-11-11 – 2017-11-13 (×3): 1 via RESPIRATORY_TRACT
  Filled 2017-11-11: qty 28

## 2017-11-11 MED ORDER — BUMETANIDE 2 MG PO TABS
4.0000 mg | ORAL_TABLET | Freq: Two times a day (BID) | ORAL | Status: DC
  Administered 2017-11-11 – 2017-11-12 (×2): 4 mg via ORAL
  Filled 2017-11-11 (×2): qty 2

## 2017-11-11 MED ORDER — ASPIRIN EC 81 MG PO TBEC
81.0000 mg | DELAYED_RELEASE_TABLET | Freq: Every day | ORAL | Status: DC
  Administered 2017-11-11 – 2017-11-12 (×2): 81 mg via ORAL
  Filled 2017-11-11 (×2): qty 1

## 2017-11-11 MED ORDER — POTASSIUM CHLORIDE 10 MEQ/100ML IV SOLN
10.0000 meq | Freq: Once | INTRAVENOUS | Status: DC
  Filled 2017-11-11: qty 100

## 2017-11-11 MED ORDER — SODIUM CHLORIDE 0.9 % IV SOLN
250.0000 mL | INTRAVENOUS | Status: DC | PRN
  Administered 2017-11-11: 10 mL via INTRAVENOUS
  Administered 2017-11-11: 250 mL via INTRAVENOUS

## 2017-11-11 MED ORDER — PANCRELIPASE (LIP-PROT-AMYL) 12000-38000 UNITS PO CPEP: 36000.0000 [IU] | ORAL_CAPSULE | ORAL | Status: DC

## 2017-11-11 MED ORDER — POTASSIUM CHLORIDE CRYS ER 20 MEQ PO TBCR
40.0000 meq | EXTENDED_RELEASE_TABLET | Freq: Once | ORAL | Status: AC
  Administered 2017-11-11: 40 meq via ORAL
  Filled 2017-11-11: qty 2

## 2017-11-11 MED ORDER — VITAMIN D 1000 UNITS PO TABS
2000.0000 [IU] | ORAL_TABLET | Freq: Every day | ORAL | Status: DC
Start: 2017-11-11 — End: 2017-11-13
  Administered 2017-11-11 – 2017-11-13 (×3): 2000 [IU] via ORAL
  Filled 2017-11-11 (×3): qty 2

## 2017-11-11 MED ORDER — SODIUM CHLORIDE 0.9% FLUSH
3.0000 mL | Freq: Two times a day (BID) | INTRAVENOUS | Status: DC
  Administered 2017-11-11 – 2017-11-13 (×5): 3 mL via INTRAVENOUS

## 2017-11-11 MED ORDER — PANCRELIPASE (LIP-PROT-AMYL) 36000-114000 UNITS PO CPEP
36000.0000 [IU] | ORAL_CAPSULE | ORAL | Status: DC | PRN
  Filled 2017-11-11: qty 1

## 2017-11-11 MED ORDER — SODIUM CHLORIDE 0.9 % IV SOLN: INTRAVENOUS | Status: DC

## 2017-11-11 MED ORDER — SODIUM CHLORIDE 0.9% FLUSH: 3.0000 mL | INTRAVENOUS | Status: DC | PRN

## 2017-11-11 MED ORDER — SERTRALINE HCL 100 MG PO TABS
100.0000 mg | ORAL_TABLET | Freq: Every day | ORAL | Status: DC
  Administered 2017-11-11 – 2017-11-12 (×2): 100 mg via ORAL
  Filled 2017-11-11 (×2): qty 1

## 2017-11-11 MED ORDER — MAGNESIUM SULFATE 2 GM/50ML IV SOLN
2.0000 g | Freq: Once | INTRAVENOUS | Status: AC
  Administered 2017-11-11: 2 g via INTRAVENOUS
  Filled 2017-11-11: qty 50

## 2017-11-11 MED ORDER — ENSURE ENLIVE PO LIQD
237.0000 mL | Freq: Two times a day (BID) | ORAL | Status: DC
  Administered 2017-11-11: 237 mL via ORAL

## 2017-11-11 NOTE — Progress Notes (Signed)
Assumed care.Agree with previous RN's assessment. 

## 2017-11-11 NOTE — Progress Notes (Signed)
Patient transferred to [place]. Patient alert an oriented. Stage one noted to patient's sacrum. Stage 4 wound and stage 2 wounds noted to left lateral thigh. CCMD notified. Patient educated on plan of care. No complaints of pain at this time. Will continue to monitor patient.

## 2017-11-11 NOTE — Progress Notes (Signed)
Patient's UA not sent due to urine having stool present. Will collect when patient voids again.

## 2017-11-11 NOTE — Progress Notes (Signed)
Paged card PA regarding pt home dose of Creon.   Attempted IV insertion and placed order for IV team.

## 2017-11-11 NOTE — H&P (Signed)
Cardiology Admission History and Physical:   Patient ID: Nina Howell; MRN: [id number]; DOB: [birth year]   Admission date: 11/10/2017  Primary Care Provider: Leeroy Cha, MD Primary Cardiologist: Loralie Champagne, MD    Chief Complaint:  Feet swelling and shortness of breath  History of Present Illness:   Ms. Reinertsen 77 y.o. female with history of diastolic CHF, OSA, DM, morbid obesity, and COPD/asthma presents with complaints leg swelling and worsening shortness of breath. For her OSA she uses a CPAP at home. Her previous echo has documented PA pressure of 56 mmHg and preserved LV function. She is also known to have normal coronaries per a previous cath. In May 2019 she complained of weight gain and SOB despite increased torsemide. Her diuretic was therefore increased ti 80mg  bid. She was also given metolazone. More recently her weight has been increasing again. She notes more feet swelling. She is unable to climb down the stairs in her home and her husband has to bring the food up to her bedroom from the kitchen. She get short of breath walking to the bathroom.  She denies any chest pain. She does not have palpitations or syncope. She states compliance with her medications and diet. She however missed multiple sub-specialty heart failure clinic appointments.   Echo 10/2016:Marland Kitchen Very poor images  LVEF 55-60% RV dilated. Moderate HK. No TR to measure RVSP  No effusion     Past Medical History:  Diagnosis Date  . Acute pancreatitis   . Anemia   . Asymptomatic cholelithiasis   . Bullous pemphigoid   . Cellulitis    ABDOMINAL WALL  . CHF (congestive heart failure) (HCC)    DECOMPENSATED SYSTOLIC CHF  . CHF (congestive heart failure) ([place])   . CTS (carpal tunnel syndrome)   . Diabetes mellitus, type 2 ([place])   . DJD (degenerative joint disease)   . Hepatic lesion   . Hyperlipidemia   . 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 Hypertension   . Hypokalemia     . Hypothyroidism   . Hypoventilation   . IUD (intrauterine device) in place   . Liver cirrhosis ([place])   . Morbid obesity ([place])   . 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 Sleep apnea    cpap  . Varicose veins     Past Surgical History:  Procedure Laterality Date  . CARDIAC CATHETERIZATION  2011   pt states "clean report"  . COLONOSCOPY WITH PROPOFOL N/A 12/24/2014   Procedure: COLONOSCOPY WITH PROPOFOL;  Surgeon: Carol Ada, MD;  Location: [facility];  Service: Endoscopy;  Laterality: N/A;  . DILATION AND CURETTAGE OF UTERUS    . SHOULDER SURGERY  2009   RIGHT SHOULDER  . TUBAL LIGATION       Medications Prior to Admission: Prior to Admission medications   Medication Sig Start Date End Date Taking? Authorizing Provider  acetaminophen (TYLENOL) 500 MG tablet Take 1,000 mg every 8 (eight) hours as needed by mouth for mild pain, moderate pain, fever or headache.    [provider]  ALPRAZolam Duanne Moron) 0.5 MG tablet Take 0.5 mg by mouth daily as needed for anxiety.    [provider]  aspirin 81 MG tablet Take 81 mg at bedtime by mouth.     [provider]  atorvastatin (LIPITOR) 20 MG tablet Take 20 mg by mouth every other day.    [provider]  Cholecalciferol (VITAMIN  D) 2000 units tablet Take 2,000 Units daily by mouth.    [provider]  Cranberry 400 MG TABS Take 1,600 mg daily by mouth.    [provider]  Cyanocobalamin (VITAMIN B-12) 2500 MCG SUBL Place 1 tablet under the tongue daily.    [provider]  fluticasone furoate-vilanterol (BREO ELLIPTA) 200-25 MCG/INH AEPB Inhale 1 puff into the lungs daily. 08/15/17   Rigoberto Noel, MD  fluticasone furoate-vilanterol (BREO ELLIPTA) 200-25 MCG/INH AEPB Inhale 1 puff into the lungs daily. 08/26/17   Parrett, Fonnie Mu, NP  guaiFENesin (MUCINEX) 600 MG 12 hr tablet Take 1,200 mg 2 (two) times  daily by mouth. Started 10/30    [provider]  insulin NPH-regular Human (NOVOLIN 70/30) (70-30) 100 UNIT/ML injection Inject 2 Units into the skin daily with supper. Takes 20 UNITS IN AM    [provider]  levothyroxine (SYNTHROID, LEVOTHROID) 50 MCG tablet Take 50 mcg by mouth daily.      [provider]  lidocaine (XYLOCAINE) 5 % ointment Apply 1 application 2 (two) times daily topically.     [provider]  Liraglutide (VICTOZA) 18 MG/3ML SOPN Inject 1.8 mg into the skin daily.    [provider]  metFORMIN (GLUCOPHAGE) 1000 MG tablet Take 1,000 mg by mouth 2 (two) times daily with a meal.    [provider]  metolazone (ZAROXOLYN) 2.5 MG tablet Take 1 tablet (2.5 mg total) by mouth as needed. Take only as directed by CHF Clinic Patient not taking: Reported on 10/30/2017 10/09/17   Georgiana Shore, NP  Multiple Vitamin (MULTIVITAMIN WITH MINERALS) TABS tablet Take 0.5 tablets by mouth daily.    [provider]  niacinamide 500 MG tablet Take 500 mg by mouth 2 (two) times daily with a meal.    [provider]  polyethylene glycol (MIRALAX / GLYCOLAX) packet Take 17 g by mouth daily as needed. Patient not taking: Reported on 10/30/2017 08/27/15   Jonetta Osgood, MD  potassium chloride SA (K-DUR,KLOR-CON) 20 MEQ tablet Take 40 mEq by mouth 2 (two) times daily.    [provider]  PROAIR HFA 108 (90 Base) MCG/ACT inhaler INHALE TWO PUFFS BY MOUTH EVERY 4 HOURS AS NEEDED FOR WHEEZING OR FOR SHORTNESS OF BREATH 08/20/17   Rigoberto Noel, MD  sertraline (ZOLOFT) 50 MG tablet Take 100 mg by mouth at bedtime.     [provider]  spironolactone (ALDACTONE) 25 MG tablet Take 1 tablet (25 mg total) by mouth daily. Please call [phone] to schedule appointment for additional refills thanks. 09/09/17   Josue Hector, MD  torsemide (DEMADEX) 20 MG tablet Take 4 tablets (80 mg total) by mouth 2 (two) times daily.  10/09/17   Georgiana Shore, NP     Allergies:    Allergies  Allergen Reactions  . Celebrex [Celecoxib] Swelling  . Penicillins Rash    Has patient had a PCN reaction causing immediate rash, facial/tongue/throat swelling, SOB or lightheadedness with hypotension: Yes Has patient had a PCN reaction causing severe rash involving mucus membranes or skin necrosis: Yes Has patient had a PCN reaction that required hospitalization: No Has patient had a PCN reaction occurring within the last 10 years: Yes - Okay taking other cillin's If all of the above answers are "NO", then may proceed with Cephalosporin use.   . Cephalexin Hives  . Lasix [Furosemide] Hives  . Oxycodone-Acetaminophen Nausea Only  . Cephalosporins Rash  . Oxycodone Anxiety  Felt weird    Social History:   Social History   Socioeconomic History  . Marital status: Married    Spouse name: Not on file  . Number of children: 4  . Years of education: Not on file  . Highest education level: Not on file  Occupational History  . 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 Financial resource strain: Not on file  . Food insecurity:    Worry: Not on file    Inability: Not on file  . Transportation needs:    Medical: Not on file    Non-medical: Not on file  Tobacco Use  . Smoking status: Never Smoker  . Smokeless tobacco: Never Used  Substance and Sexual Activity  . Alcohol use: No  . Drug use: No  . Sexual activity: Yes    Birth control/protection: IUD  Lifestyle  . Physical activity:    Days per week: Not on file    Minutes per session: Not on file  . Stress: Not on file  Relationships  . Social connections:    Talks on phone: Not on file    Gets together: Not on file    Attends religious service: Not on file    Active member of club or organization: Not on file    Attends meetings of clubs or organizations: Not on file    Relationship status: Not on file  .  Intimate partner violence:    Fear of current or ex partner: Not on file    Emotionally abused: Not on file    Physically abused: Not on file    Forced sexual activity: Not on file  Other Topics Concern  . Not on file  Social History Narrative  . Not on file    Family History:   The patient's family history includes Cancer in her mother; Diabetes in her father; Leukemia in her father.    Review of Systems: [y] = yes, [ ]  = no   . General: Weight gain [ ] ; Weight loss [ ] ; Anorexia [ ] ; Fatigue [ ] ; Fever [ ] ; Chills [ ] ; Weakness [ ]   . Cardiac: Chest pain/pressure [ ] ; Resting SOB [ ] ; Exertional SOB [ ] ; Orthopnea [ ] ; Pedal Edema [ ] ; Palpitations [ ] ; Syncope [ ] ; Presyncope [ ] ; Paroxysmal nocturnal dyspnea[ ]   . Pulmonary: Cough [ ] ; Wheezing[ ] ; Hemoptysis[ ] ; Sputum [ ] ; Snoring [ ]   . GI: Vomiting[ ] ; Dysphagia[ ] ; Melena[ ] ; Hematochezia [ ] ; Heartburn[ ] ; Abdominal pain [ ] ; Constipation [ ] ; Diarrhea [ ] ; BRBPR [ ]   . GU: Hematuria[ ] ; Dysuria [ ] ; Nocturia[ ]   . Vascular: Pain in legs with walking [ ] ; Pain in feet with lying flat [ ] ; Non-healing sores [ ] ; Stroke [ ] ; TIA [ ] ; Slurred speech [ ] ;  . Neuro: Headaches[ ] ; Vertigo[ ] ; Seizures[ ] ; Paresthesias[ ] ;Blurred vision [ ] ; Diplopia [ ] ; Vision changes [ ]   . Ortho/Skin: Arthritis [ ] ; Joint pain [ ] ; Muscle pain [ ] ; Joint swelling [ ] ; Back Pain [ ] ; Rash [ ]   . Psych: Depression[ ] ; Anxiety[ ]   . Heme: Bleeding problems [ ] ; Clotting disorders [ ] ; Anemia [ ]   . Endocrine: Diabetes [ ] ; Thyroid dysfunction[ ]     Physical Exam/Data:   Vitals:   11/10/17 2221 11/11/17 0000 11/11/17 0030 11/11/17 0131  BP:  (!) 97/57 (!) 89/66 92/68  Pulse: 89  81 84 87  Resp: 18 (!) 27 (!) 24 16  Temp: 97.8 F (36.6 C)     TempSrc: Oral     SpO2: 100% 100% 100% 100%  Weight:      Height:       No intake or output data in the 24 hours ending 11/11/17 0212 Filed Weights   11/10/17 2218  Weight: 130.2 kg (287 lb)   Body  mass index is 54.23 kg/m.   General:  Well nourished, well developed, mildly tachypneic, morbidly obese HEENT: normal Lymph: no adenopathy Neck:  Unable to assess JVD due to thick neck Endocrine:  No thryomegaly Vascular: No carotid bruits; FA pulses 2+ bilaterally without bruits  Cardiac:  normal S1, S2; RRR; no murmur Lungs:  Crackles at both bases Abd: soft, nontender, no hepatomegaly  Ext: edema in both legs, wound on left leg Musculoskeletal:  No deformities, BUE and BLE strength normal and equal Skin: warm and dry  Neuro:  CNs 2-12 intact, no focal abnormalities noted Psych:  Normal affect    EKG:  Sinus rhythm, left atrial enlargement, poor R wave progression  Relevant CV Studies: Echocardiogram - 10/19/2016 - Left ventricle: The cavity size was normal. There was mild   concentric hypertrophy. Doppler parameters are consistent with   abnormal left ventricular relaxation (grade 1 diastolic   dysfunction). - Left atrium: The atrium was dilated. - Right ventricle: Appears dilated with at least moderately reduced   systolic function. Recommendations:  Alternative imaging methods are recommended. Even with Definity contrast the images are almost uninterpretable.  Venous US bilateral lower extremities Right: There is no evidence of deep vein thrombosis in the lower extremity.There is no evidence of superficial venous thrombosis. There is no evidence of a Baker's cyst. Left: There is no evidence of deep vein thrombosis in the lower extremity.Unable to visualize the peroneal vein well enough enough to evaluater.There is no evidence of superficial venous thrombosis. There is no evidence of a Baker's cyst    Laboratory Data:  Chemistry Recent Labs  Lab 11/10/17 2302  NA 134*  K 2.9*  CL 96*  CO2 25  GLUCOSE 186*  BUN 43*  CREATININE 1.44*  CALCIUM 8.5*  GFRNONAA 35*  GFRAA 40*  ANIONGAP 13    Recent Labs  Lab 11/10/17 2302  PROT 6.9  ALBUMIN 3.1*  AST 43*    ALT 41  ALKPHOS 85  BILITOT 0.7   Hematology Recent Labs  Lab 11/10/17 2302  WBC 7.3  RBC 4.25  HGB 12.5  HCT 38.9  MCV 91.5  MCH 29.4  MCHC 32.1  RDW 15.8*  PLT 178   Cardiac EnzymesNo results for input(s): TROPONINI in the last 168 hours.  Recent Labs  Lab 11/10/17 2312  TROPIPOC 0.01    BNP Recent Labs  Lab 11/10/17 2302  BNP 2,661.5*    DDimer No results for input(s): DDIMER in the last 168 hours.  Radiology/Studies:  Dg Chest 2 View - Result Date: 11/10/2017 CLINICAL DATA:  CHF and sleep apnea. Abdominal pain since Friday. Orthopnea at baseline. EXAM: CHEST - 2 VIEW COMPARISON:  04/03/2017 FINDINGS: Stable cardiomegaly with tortuous atherosclerotic aorta. No overt pulmonary edema consolidation. No effusion or pneumothorax. Osteoarthritis of the AC joints bilaterally and marked osteoarthritis with spurring the left glenohumeral joint. IMPRESSION: Cardiomegaly with aortic atherosclerosis. No active pulmonary disease.    Assessment and Plan:     1. Acute on Chronic diastolic HF   The patient reports NYHA class III symptoms.  She is volume overloaded on my assessment today. Her BNP is 2661.5 (up from 1559.2 one month ago). The metabolic panel reveals Na 134, K 2.9, Gluc 186, BUN 43, Cr 1.44, GFR 40 mL/min. The troponin is 0.01 - Continue aspirin and atorvastatin - Continue spironolactone and loop diuretic - Will consider IV route for diuretic - Replenish electrolytes as needed  - Goal is 1-2 liters negative in the next 24 hours    2. Acute kidney injury - Monitor urine output, serum creatinine - Will hold off on adding an ACEi/ARB   For questions or updates, please contact [place] Please consult [email or web address] for contact info under Cardiology/STEMI.    Signed, Meade Maw, MD  11/11/2017 2:12 AM

## 2017-11-11 NOTE — Progress Notes (Addendum)
Progress Note  Patient Name: Nina Howell Date of Encounter: 11/11/2017  Primary Cardiologist: Loralie Champagne, MD   Subjective   No cardiac complaints some gagging on food   Inpatient Medications    Scheduled Meds: . aspirin EC  81 mg Oral QHS  . atorvastatin  20 mg Oral QODAY  . bumetanide  2 mg Oral BID  . cholecalciferol  2,000 Units Oral Daily  . enoxaparin (LOVENOX) injection  40 mg Subcutaneous Q24H  . feeding supplement (ENSURE ENLIVE)  237 mL Oral BID BM  . fluticasone furoate-vilanterol  1 puff Inhalation Daily  . guaiFENesin  1,200 mg Oral BID  . levothyroxine  50 mcg Oral QAC breakfast  . sertraline  100 mg Oral QHS  . sodium chloride flush  3 mL Intravenous Q12H  . spironolactone  25 mg Oral Daily  . vitamin B-12  2,500 mcg Oral Daily   Continuous Infusions: . sodium chloride 10 mL/hr at 11/11/17 0610  . magnesium sulfate 1 - 4 g bolus IVPB    . potassium chloride     PRN Meds: sodium chloride, acetaminophen, ALPRAZolam, ondansetron (ZOFRAN) IV, sodium chloride flush   Vital Signs    Vitals:   11/11/17 0245 11/11/17 0300 11/11/17 0400 11/11/17 0409  BP: 94/73 (!) 86/64  112/83  Pulse: 90 88  94  Resp: (!) 22 (!) 23  20  Temp:    97.6 F (36.4 C)  TempSrc:    Oral  SpO2: 97% 98%  100%  Weight:   291 lb 12.8 oz (132.4 kg)   Height:   5\' 1"  (1.549 m)     Intake/Output Summary (Last 24 hours) at 11/11/2017 0750 Last data filed at 11/11/2017 2951 Gross per 24 hour  Intake 290.68 ml  Output 150 ml  Net 140.68 ml   Filed Weights   11/10/17 2218 11/11/17 0400  Weight: 287 lb (130.2 kg) 291 lb 12.8 oz (132.4 kg)    Telemetry    NSR 11/11/2017  - Personally Reviewed  ECG    NSR rate 87 no acute changes  - Personally Reviewed  Physical Exam  Chronically obese black female with poor dentition  GEN: No acute distress.   Neck: No JVD Cardiac: RRR, no murmurs, rubs, or gallops.  Respiratory: basilar rales  GI: Soft, nontender,  non-distended  MS: No edema; No deformity. Neuro:  Nonfocal  Psych: Normal affect  Plus 1-2 LE edema with varicosities Tegaderm on left thigh skin breakdown   Labs    Chemistry Recent Labs  Lab 11/10/17 2302 11/11/17 0308  NA 134*  --   K 2.9*  --   CL 96*  --   CO2 25  --   GLUCOSE 186*  --   BUN 43*  --   CREATININE 1.44* 1.55*  CALCIUM 8.5*  --   PROT 6.9  --   ALBUMIN 3.1*  --   AST 43*  --   ALT 41  --   ALKPHOS 85  --   BILITOT 0.7  --   GFRNONAA 35* 32*  GFRAA 40* 37*  ANIONGAP 13  --      Hematology Recent Labs  Lab 11/10/17 2302 11/11/17 0308  WBC 7.3 7.9  RBC 4.25 4.09  HGB 12.5 12.0  HCT 38.9 37.7  MCV 91.5 92.2  MCH 29.4 29.3  MCHC 32.1 31.8  RDW 15.8* 15.8*  PLT 178 165    Cardiac EnzymesNo results for input(s): TROPONINI in the last 168 hours.  Recent Labs  Lab 11/10/17 2312  TROPIPOC 0.01     BNP Recent Labs  Lab 11/10/17 2302  BNP 2,661.5*     DDimer No results for input(s): DDIMER in the last 168 hours.   Radiology    Dg Chest 2 View  Result Date: 11/10/2017 CLINICAL DATA:  CHF and sleep apnea. Abdominal pain since Friday. Orthopnea at baseline. EXAM: CHEST - 2 VIEW COMPARISON:  04/03/2017 FINDINGS: Stable cardiomegaly with tortuous atherosclerotic aorta. No overt pulmonary edema consolidation. No effusion or pneumothorax. Osteoarthritis of the AC joints bilaterally and marked osteoarthritis with spurring the left glenohumeral joint. IMPRESSION: Cardiomegaly with aortic atherosclerosis. No active pulmonary disease. Electronically Signed   By: Ashley Royalty M.D.   On: 11/10/2017 23:35    Cardiac Studies   TTE 2017 EF 40-45% 2018 no EF given due to poor image quality   Patient Profile     77 y.o. female with history of diastolic CHF, OSA, DM, morbid obesity,andCOPD/asthma presents with complaints leg swelling and worsening shortness of breath EF 40-45% TTE 2017 non diagnositc EF TTE 2018   Assessment & Plan    1. Acute on  chronic diastolic heart failure  - BNP increased to 2661.5 (was 1559.2 one month ago) on bumex and aldactone not much diuresis will increase aldactone and bumex Noted allergy to lasix with hives   Suspect outpatient MUGA scan would be best for EF evaluation   2. AKI Cr 1.55 baseline   3. Hypokalemia K 2.9 repeat pending supplement   4. Morbid obesity with some skin break down Left thigh skin care consult  For questions or updates, please contact [place] Please consult [email or web address] for contact info under Cardiology/STEMI.      Hilbert Corrigan, PA  11/11/2017, 7:50 AM

## 2017-11-11 NOTE — Progress Notes (Signed)
Patient complaining of potassium burning. Rate decreased to 77ml/hr. Will continue to monitor patient.

## 2017-11-11 NOTE — Progress Notes (Signed)
Initial Nutrition Assessment  DOCUMENTATION CODES:   Morbid obesity  INTERVENTION:   -1 packet Juven BID, each packet provides 80 calories, 8 grams of carbohydrate, and 14 grams of amino acids; supplement contains CaHMB, glutamine, and arginine, to promote wound healing -D/c Ensure Enlive po BID, each supplement provides 350 kcal and 20 grams of protein  NUTRITION DIAGNOSIS:   Increased nutrient needs related to wound healing as evidenced by estimated needs.  GOAL:   Patient will meet greater than or equal to 90% of their needs  MONITOR:   PO intake, Supplement acceptance, Labs, Weight trends, Skin, I & O's  REASON FOR ASSESSMENT:   Malnutrition Screening Tool    ASSESSMENT:   Nina Howell 77 y.o. female with history of diastolic CHF, OSA, DM, morbid obesity, and COPD/asthma presents with complaints leg swelling and worsening shortness of breath.  Pt admitted with CHF.   Pt sleeping soundly at time of visit. Unable to arouse to obtain history.   Reviewed wt hx; wt has been stable over the past year.   Noted pt with multiple areas of skin breakdown; would benefit from nutritional supplementation to assist with healing.   Last Hgb A1c: 7.5 (04/10/10). No recent Hgb A1c to assess. PTA DM medications 70/30 insulin NPO-regular human (20 units in AM, 2 units in PM), 1000 mg metformin BID, and 1.8 mg liraglutide daily.   Labs reviewed: Na: 134, K: 2.9 (on IV supplementation).   NUTRITION - FOCUSED PHYSICAL EXAM:    Most Recent Value  Orbital Region  No depletion  Upper Arm Region  No depletion  Thoracic and Lumbar Region  No depletion  Buccal Region  No depletion  Temple Region  No depletion  Clavicle Bone Region  No depletion  Clavicle and Acromion Bone Region  No depletion  Scapular Bone Region  No depletion  Dorsal Hand  No depletion  Patellar Region  No depletion  Anterior Thigh Region  No depletion  Posterior Calf Region  No depletion  Edema (RD Assessment)   Moderate  Hair  Reviewed  Eyes  Reviewed  Mouth  Reviewed  Skin  Reviewed  Nails  Reviewed       Diet Order:   Diet Order           Diet 2 gram sodium Room service appropriate? Yes; Fluid consistency: Thin  Diet effective now          EDUCATION NEEDS:   Not appropriate for education at this time  Skin:  Skin Assessment: Skin Integrity Issues: Skin Integrity Issues:: Stage I, Other (Comment) Stage I: buttocks Other: non pressure wound lt thigh  Last BM:  11/11/17  Height:   Ht Readings from Last 1 Encounters:  11/11/17 5\' 1"  (1.549 m)    Weight:   Wt Readings from Last 1 Encounters:  11/11/17 291 lb 12.8 oz (132.4 kg)    Ideal Body Weight:  47.7 kg  BMI:  Body mass index is 55.14 kg/m.  Estimated Nutritional Needs:   Kcal:  1500-1700  Protein:  95-110 grams  Fluid:  > 1.5 L    Nina Howell, RD, LDN, CDE Pager: [phone] After hours Pager: [phone]

## 2017-11-12 DIAGNOSIS — I5023 Acute on chronic systolic (congestive) heart failure: Secondary | ICD-10-CM

## 2017-11-12 LAB — BASIC METABOLIC PANEL
ANION GAP: 13 (ref 5–15)
BUN: 50 mg/dL — ABNORMAL HIGH (ref 8–23)
CO2: 25 mmol/L (ref 22–32)
CREATININE: 1.32 mg/dL — AB (ref 0.44–1.00)
Calcium: 9 mg/dL (ref 8.9–10.3)
Chloride: 96 mmol/L — ABNORMAL LOW (ref 98–111)
GFR, EST AFRICAN AMERICAN: 45 mL/min — AB (ref >60)
GFR, EST NON AFRICAN AMERICAN: 38 mL/min — AB (ref >60)
Glucose, Bld: 247 mg/dL — ABNORMAL HIGH (ref 70–99)
Potassium: 4.1 mmol/L (ref 3.5–5.1)
Sodium: 134 mmol/L — ABNORMAL LOW (ref 135–145)

## 2017-11-12 MED ORDER — TORSEMIDE 20 MG PO TABS
40.0000 mg | ORAL_TABLET | Freq: Every evening | ORAL | Status: DC
  Administered 2017-11-12: 40 mg via ORAL
  Filled 2017-11-12: qty 2

## 2017-11-12 MED ORDER — TORSEMIDE 20 MG PO TABS
80.0000 mg | ORAL_TABLET | Freq: Every day | ORAL | Status: DC
  Administered 2017-11-13: 80 mg via ORAL
  Filled 2017-11-12: qty 4

## 2017-11-12 NOTE — Progress Notes (Addendum)
Progress Note  Patient Name: Nina Howell Date of Encounter: 11/12/2017  Primary Cardiologist: Loralie Champagne, MD   Subjective   Pt seen sitting up on [facility]. She states that her breathing is "excellent", much better. No edema.  Inpatient Medications    Scheduled Meds: . aspirin EC  81 mg Oral QHS  . atorvastatin  20 mg Oral QODAY  . bumetanide  4 mg Oral BID  . cholecalciferol  2,000 Units Oral Daily  . enoxaparin (LOVENOX) injection  40 mg Subcutaneous Q24H  . fluticasone furoate-vilanterol  1 puff Inhalation Daily  . guaiFENesin  1,200 mg Oral BID  . levothyroxine  50 mcg Oral QAC breakfast  . lipase/protease/amylase  72,000 Units Oral TID WC  . nutrition supplement (JUVEN)  1 packet Oral BID BM  . sertraline  100 mg Oral QHS  . sodium chloride flush  3 mL Intravenous Q12H  . spironolactone  50 mg Oral Daily  . vitamin B-12  2,500 mcg Oral Daily   Continuous Infusions: . sodium chloride 20 mL/hr at 11/11/17 1200   PRN Meds: sodium chloride, acetaminophen, ALPRAZolam, lipase/protease/amylase **AND** lipase/protease/amylase, ondansetron (ZOFRAN) IV, sodium chloride flush   Vital Signs    Vitals:   11/11/17 2002 11/12/17 0014 11/12/17 0502 11/12/17 0805  BP: 114/79 118/75 107/60 113/71  Pulse: 86 78 78 78  Resp: 18 20 18 20   Temp: (!) 97.5 F (36.4 C) 98.6 F (37 C) 98.5 F (36.9 C) 98.6 F (37 C)  TempSrc: Oral Oral Oral Oral  SpO2: 100% 99% 100% 100%  Weight:   290 lb (131.5 kg)   Height:        Intake/Output Summary (Last 24 hours) at 11/12/2017 0913 Last data filed at 11/12/2017 0848 Gross per 24 hour  Intake 439.17 ml  Output -  Net 439.17 ml   Filed Weights   11/10/17 2218 11/11/17 0400 11/12/17 0502  Weight: 287 lb (130.2 kg) 291 lb 12.8 oz (132.4 kg) 290 lb (131.5 kg)    Telemetry    Sinus rhythm in the 80's with Occ PVCs - Personally Reviewed  ECG    No new tracings - Personally Reviewed  Physical Exam   GEN: Obese female, No  acute distress.   Neck: No JVD Cardiac: RRR, no murmurs, rubs, or gallops.  Respiratory: Clear to auscultation bilaterally. GI: Soft, nontender, non-distended  MS: No edema; No deformity. Neuro:  Nonfocal  Psych: Normal affect   Labs    Chemistry Recent Labs  Lab 11/10/17 2302 11/11/17 0308 11/12/17 0518  NA 134*  --  134*  K 2.9*  --  4.1  CL 96*  --  96*  CO2 25  --  25  GLUCOSE 186*  --  247*  BUN 43*  --  50*  CREATININE 1.44* 1.55* 1.32*  CALCIUM 8.5*  --  9.0  PROT 6.9  --   --   ALBUMIN 3.1*  --   --   AST 43*  --   --   ALT 41  --   --   ALKPHOS 85  --   --   BILITOT 0.7  --   --   GFRNONAA 35* 32* 38*  GFRAA 40* 37* 45*  ANIONGAP 13  --  13     Hematology Recent Labs  Lab 11/10/17 2302 11/11/17 0308  WBC 7.3 7.9  RBC 4.25 4.09  HGB 12.5 12.0  HCT 38.9 37.7  MCV 91.5 92.2  MCH 29.4 29.3  MCHC 32.1  31.8  RDW 15.8* 15.8*  PLT 178 165    Cardiac EnzymesNo results for input(s): TROPONINI in the last 168 hours.  Recent Labs  Lab 11/10/17 2312  TROPIPOC 0.01     BNP Recent Labs  Lab 11/10/17 2302  BNP 2,661.5*     DDimer No results for input(s): DDIMER in the last 168 hours.   Radiology    Dg Chest 2 View  Result Date: 11/10/2017 CLINICAL DATA:  CHF and sleep apnea. Abdominal pain since Friday. Orthopnea at baseline. EXAM: CHEST - 2 VIEW COMPARISON:  04/03/2017 FINDINGS: Stable cardiomegaly with tortuous atherosclerotic aorta. No overt pulmonary edema consolidation. No effusion or pneumothorax. Osteoarthritis of the AC joints bilaterally and marked osteoarthritis with spurring the left glenohumeral joint. IMPRESSION: Cardiomegaly with aortic atherosclerosis. No active pulmonary disease. Electronically Signed   By: Ashley Royalty M.D.   On: 11/10/2017 23:35    Cardiac Studies   Echo 10/19/16 Study Conclusions  - Left ventricle: The cavity size was normal. There was mild   concentric hypertrophy. Doppler parameters are consistent with    abnormal left ventricular relaxation (grade 1 diastolic   dysfunction). - Left atrium: The atrium was dilated. - Right ventricle: Appears dilated with at least moderately reduced   systolic function.  Recommendations:  Alternative imaging methods are recommended. Even with Definity contrast the images are almost uninterpretable  Patient Profile     77 y.o. female with history of diastolic CHF, OSA, DM, morbid obesity,andCOPD/asthmapresents with complaints leg swelling and worsening shortness of breath EF 40-45%, TTE 2017 non diagnositc EF TTE 2018   Assessment & Plan    Acute on chronic diastolic heart failure -BNP increased to 2661.5, was 1559.2 one month ago. On torsemide and aldactone 25 mg daily without much diuresis at home. Pt has allergy to lasix documented, hives. She reports that she was doing well on torsemide 80 mg bid and then she reduced it on her own to 40 mg BID a couple of weeks ago because her feet were no longer swollen and she was having trouble getting to the bathroom because of foot pain.  -Diuresing with Bumex 4 mg BID and increased spironolactone 50 mg daily.  -There is no significant urine output documented, pt uses a pad for incontinence. Wt is up 3 pounds from admission, but pt is breathing better and lungs clear, no edema.  -Will resume her home medical regimen except for torsemide 80 mg in the am and 40 mg in the pm and plan for discharge tomorrow morning per Dr. Johnsie Cancel  AKI  - SCr up to 1.55, down to 1.32 today.  Hypokalemia -K+ 2.9 on 6/23. Today K+ 4.1 with supplementation and increased spironolactone. Will follow in am.   Morbid obesity -Skin breakdown on left thigh followed by home care for wound management. Will order would care consult here.   For questions or updates, please contact [place] Please consult [email or web address] for contact info under Cardiology/STEMI.      Signed, Daune Perch, NP  11/12/2017, 9:13 AM    Back to baseline  Exam with clear lungs and trace LE edema. Bruising on back and tegaderm on left thigh K/Cr ok Change back to oral home regimen and ambulate Home in am if stable  Jenkins Rouge

## 2017-11-12 NOTE — Consult Note (Addendum)
   [facility] CM Inpatient Consult   11/12/2017  Nina Howell 1941/04/20 [id number]  Referral received from MD office regarding patient's difficulty getting to appointments possible transportation issues.  Met with the patient at the bedside and spoke with patient at length regarding needs.  Patient states, "I don't have problems with transportation, I have anxiety getting from downstairs when I am sick and when I am short of breath."  Talked at length regarding post hospital needs.  Patient states she has great family support.  Her bedrooms are upstairs and they have a den there.  Her husband provides her meals although he works at [employer].  He provides her meals daily. She states that she got a quote for the stairs lift chair for $7,000.00 and she knows most transportation services such as [employer] requires the patients to make it out side to the vehicle to get.  She states she has paramedicine follow up from [facility]. She states, "I really do try to do what's right."  Patient states, "Please thank Dr. Hope Budds for her concern but there's no transportation issues, I just have too much anxiety trying to get downstairs and it takes two para medics to get me downstairs and 4 to bring me upstairs."  Patient did accept the brochure, 24 hour nurse advise line and contact information.  She has private personal care services as well.  Patient is a retired Education officer, museum.  For questions, please contact:  Natividad Brood, RN BSN [facility] Liaison  [phone] business mobile phone Toll free office [phone]

## 2017-11-12 NOTE — Progress Notes (Signed)
Patient UA not sent due to stool being present in the urine.   Drue Flirt, RN

## 2017-11-12 NOTE — Consult Note (Signed)
[facility] Nurse wound consult note Assessment completed in [place].  Foam dressings and Xeroform gauze were present to wounds. Reason for Consult: Left thigh wounds.  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  Also, that her cardiologist will not allow her to receive steriods any longer as they increase her fluid retention. Wound type: Full thickness wounds are located at the following areas: Left lateral thigh measures 4.6 cm x 2.8 cm x 0.9 cm and is 100% clean and pink, no odor, some drainage on existing xeroform and foam dressing. Left lateral knee area has a wound that measures 2.2 cm x 1.6 cm x 0.4 cm and is also 100% clean and pink, no odor, some drainage on the existing foam dressing. Left posterior knee area has a wound that measures 0.8 cm x 2 cm x 0.2 cm is 100% clean and pink, no odor, some drainage on the existing xeroform and foam dressing. All wounds have margins that are rolled and white in color indicating excess moisture. Plan of care for the areas: Gently cleanse wounds on left thigh and posterior knee area with saline.  Pat dry.  Apply small pieces of Aquacell Ag+ into the wound beds.  Cover with foam dressings.  Change every 3 days and prn. I offered the patient a bariatric bed, but she refused. Monitor the wound area(s) for worsening of condition such as: Signs/symptoms of infection,  Increase in size,  Development of or worsening of odor, Development of pain, or increased pain at the affected locations.  Notify the medical team if any of these develop.  Thank you for the consult.  Discussed plan of care with the patient and bedside nurse.  The [facility] nurse will not follow at this time.  Please re-consult the [facility] team if needed.  Val Riles, RN, MSN, CWOCN, CNS-BC, pager [phone]

## 2017-11-13 LAB — BASIC METABOLIC PANEL
ANION GAP: 11 (ref 5–15)
BUN: 55 mg/dL — ABNORMAL HIGH (ref 8–23)
CALCIUM: 9.4 mg/dL (ref 8.9–10.3)
CO2: 26 mmol/L (ref 22–32)
Chloride: 95 mmol/L — ABNORMAL LOW (ref 98–111)
Creatinine, Ser: 1.4 mg/dL — ABNORMAL HIGH (ref 0.44–1.00)
GFR calc non Af Amer: 36 mL/min — ABNORMAL LOW (ref >60)
GFR, EST AFRICAN AMERICAN: 41 mL/min — AB (ref >60)
Glucose, Bld: 350 mg/dL — ABNORMAL HIGH (ref 70–99)
Potassium: 4 mmol/L (ref 3.5–5.1)
Sodium: 132 mmol/L — ABNORMAL LOW (ref 135–145)

## 2017-11-13 MED ORDER — TORSEMIDE 20 MG PO TABS: 40.0000 mg | ORAL_TABLET | Freq: Every evening | ORAL | 5 refills | Status: DC

## 2017-11-13 MED ORDER — INSULIN ASPART 100 UNIT/ML ~~LOC~~ SOLN
0.0000 [IU] | Freq: Three times a day (TID) | SUBCUTANEOUS | Status: DC
  Administered 2017-11-13: 11 [IU] via SUBCUTANEOUS

## 2017-11-13 MED ORDER — TORSEMIDE 20 MG PO TABS: 80.0000 mg | ORAL_TABLET | Freq: Two times a day (BID) | ORAL | 3 refills | Status: DC

## 2017-11-13 MED ORDER — TORSEMIDE 20 MG PO TABS: 80.0000 mg | ORAL_TABLET | Freq: Every day | ORAL | 5 refills | Status: DC

## 2017-11-13 MED ORDER — INSULIN ASPART 100 UNIT/ML ~~LOC~~ SOLN: 0.0000 [IU] | Freq: Every day | SUBCUTANEOUS | Status: DC

## 2017-11-13 NOTE — Care Management Note (Signed)
Case Management Note  Patient Details  Name: Nina Howell MRN: [id number] Date of Birth: [birth year]  Subjective/Objective:  CHF               Action/Plan: Patient lives at home with spouse; Primary Care Provider: Leeroy Cha, MD; has private insurance with BCBS with prescription drug coverage; [facility] choice [name]; patient states no problem getting her medication. She is active with [place] for [facility] and has a personal care service- nurses aide that provide service for 5 hrs twice a week; DME - she has home oxygen, walker, rollater, shower chair and stool, 3:1; CM will continue to follow for progression of care.  Expected Discharge Date:  11/14/17               Expected Discharge Plan:  [place]  In-House Referral:   [facility]  Discharge planning Services  CM Consult  Choice offered to:  Patient  HH Arranged:  RN, Disease Management, PT [place] Agency:  [place]  Status of Service:  In process, will continue to follow  Sherrilyn Rist [phone] 11/13/2017, 10:47 AM

## 2017-11-13 NOTE — Plan of Care (Signed)
  Problem: Health Behavior/Discharge Planning: Goal: Ability to manage health-related needs will improve Outcome: Progressing   Problem: Clinical Measurements: Goal: Ability to maintain clinical measurements within normal limits will improve Outcome: Progressing   Problem: Clinical Measurements: Goal: Cardiovascular complication will be avoided Outcome: Progressing   

## 2017-11-13 NOTE — Progress Notes (Signed)
Progress Note  Patient Name: Nina Howell Date of Encounter: 11/13/2017  Primary Cardiologist: Loralie Champagne, MD   Subjective   Bruising in arms dyspnea improved ready for d/c   Inpatient Medications    Scheduled Meds: . aspirin EC  81 mg Oral QHS  . atorvastatin  20 mg Oral QODAY  . cholecalciferol  2,000 Units Oral Daily  . enoxaparin (LOVENOX) injection  40 mg Subcutaneous Q24H  . fluticasone furoate-vilanterol  1 puff Inhalation Daily  . guaiFENesin  1,200 mg Oral BID  . levothyroxine  50 mcg Oral QAC breakfast  . lipase/protease/amylase  72,000 Units Oral TID WC  . nutrition supplement (JUVEN)  1 packet Oral BID BM  . sertraline  100 mg Oral QHS  . sodium chloride flush  3 mL Intravenous Q12H  . spironolactone  50 mg Oral Daily  . torsemide  40 mg Oral QPM  . torsemide  80 mg Oral Daily  . vitamin B-12  2,500 mcg Oral Daily   Continuous Infusions: . sodium chloride 20 mL/hr at 11/11/17 1200   PRN Meds: sodium chloride, acetaminophen, ALPRAZolam, lipase/protease/amylase **AND** lipase/protease/amylase, ondansetron (ZOFRAN) IV, sodium chloride flush   Vital Signs    Vitals:   11/12/17 0915 11/12/17 1157 11/12/17 1931 11/13/17 0536  BP:  95/67 125/87 105/66  Pulse:  82 88 75  Resp:  20 18 20   Temp:  97.7 F (36.5 C) 97.6 F (36.4 C) 97.6 F (36.4 C)  TempSrc:  Oral Oral Oral  SpO2: 100% 100% 95% 100%  Weight:    296 lb (134.3 kg)  Height:        Intake/Output Summary (Last 24 hours) at 11/13/2017 0730 Last data filed at 11/13/2017 0600 Gross per 24 hour  Intake 915 ml  Output 50 ml  Net 865 ml   Filed Weights   11/11/17 0400 11/12/17 0502 11/13/17 0536  Weight: 291 lb 12.8 oz (132.4 kg) 290 lb (131.5 kg) 296 lb (134.3 kg)    Telemetry    Sinus rhythm in the 80's with Occ PVCs - Personally Reviewed  ECG    No new tracings - Personally Reviewed  Physical Exam   GEN: Obese female, No acute distress.   Neck: No JVD Cardiac: RRR, no  murmurs, rubs, or gallops.  Respiratory: Clear to auscultation bilaterally. GI: Soft, nontender, non-distended  MS: No edema; No deformity. Neuro:  Nonfocal  Psych: Normal affect   Labs    Chemistry Recent Labs  Lab 11/10/17 2302 11/11/17 0308 11/12/17 0518  NA 134*  --  134*  K 2.9*  --  4.1  CL 96*  --  96*  CO2 25  --  25  GLUCOSE 186*  --  247*  BUN 43*  --  50*  CREATININE 1.44* 1.55* 1.32*  CALCIUM 8.5*  --  9.0  PROT 6.9  --   --   ALBUMIN 3.1*  --   --   AST 43*  --   --   ALT 41  --   --   ALKPHOS 85  --   --   BILITOT 0.7  --   --   GFRNONAA 35* 32* 38*  GFRAA 40* 37* 45*  ANIONGAP 13  --  13     Hematology Recent Labs  Lab 11/10/17 2302 11/11/17 0308  WBC 7.3 7.9  RBC 4.25 4.09  HGB 12.5 12.0  HCT 38.9 37.7  MCV 91.5 92.2  MCH 29.4 29.3  MCHC 32.1 31.8  RDW  15.8* 15.8*  PLT 178 165    Cardiac EnzymesNo results for input(s): TROPONINI in the last 168 hours.  Recent Labs  Lab 11/10/17 2312  TROPIPOC 0.01     BNP Recent Labs  Lab 11/10/17 2302  BNP 2,661.5*     DDimer No results for input(s): DDIMER in the last 168 hours.   Radiology    No results found.  Cardiac Studies   Echo 10/19/16 Study Conclusions  - Left ventricle: The cavity size was normal. There was mild   concentric hypertrophy. Doppler parameters are consistent with   abnormal left ventricular relaxation (grade 1 diastolic   dysfunction). - Left atrium: The atrium was dilated. - Right ventricle: Appears dilated with at least moderately reduced   systolic function.  Recommendations:  Alternative imaging methods are recommended. Even with Definity contrast the images are almost uninterpretable  Patient Profile     77 y.o. female with history of diastolic CHF, OSA, DM, morbid obesity,andCOPD/asthmapresents with complaints leg swelling and worsening shortness of breath EF 40-45%, TTE 2017 non diagnositc EF TTE 2018   Assessment & Plan    Acute on chronic  diastolic heart failure -BNP increased to 2661.5, was 1559.2 one month ago. On torsemide and aldactone 25 mg daily without much diuresis at home. Pt has allergy to lasix documented, hives. She reports that she was doing well on torsemide 80 mg bid and then she reduced it on her own to 40 mg BID a couple of weeks ago because her feet were no longer swollen and she was having trouble getting to the bathroom because of foot pain.  -Diuresing with Bumex 4 mg BID and increased spironolactone 50 mg daily.  -There is no significant urine output documented, pt uses a pad for incontinence. Wt is up 3 pounds from admission, but pt is breathing better and lungs clear, no edema.  D/c home today with torsemide 80 mg am and 40 mg pm  Outpatient f/u CHF clinic [facility]   AKI  - SCr up to 1.55, down to 1.32 today.  Hypokalemia Normal this am 4.1   Morbid obesity -Skin breakdown on left thigh followed by home care for wound management. Will order would care consult here.   For questions or updates, please contact [place] Please consult [email or web address] for contact info under Cardiology/STEMI.      Signed, Jenkins Rouge, MD  11/13/2017, 7:30 AM    Back to baseline Exam with clear lungs and trace LE edema. Bruising on back and tegaderm on left thigh K/Cr ok Change back to oral home regimen and ambulate Home in am if stable  Jenkins Rouge

## 2017-11-13 NOTE — Progress Notes (Signed)
Inpatient Diabetes Program Recommendations  AACE/ADA: New Consensus Statement on Inpatient Glycemic Control (2015)  Target Ranges:  Prepandial:   less than 140 mg/dL      Peak postprandial:   less than 180 mg/dL (1-2 hours)      Critically ill patients:  140 - 180 mg/dL    Review of Glycemic ControlResults for DEVEN, AUDI (MRN [id number]) as of 11/13/2017 10:50  Ref. Range 11/10/2017 23:02 11/12/2017 05:18 11/13/2017 06:13  Glucose Latest Ref Range: 70 - 99 mg/dL 186 (H) 247 (H) 350 (H)    Diabetes history: Type 2 DM Outpatient Diabetes medications: 70/30 insulin 20 units q AM  Current orders for Inpatient glycemic control: None  Inpatient Diabetes Program Recommendations:    Note history of DM and patient was on insulin prior to admit.  Please add "Glycemic control order set".  Text page sent to NP.   Thanks,  Adah Perl, RN, BC-ADM Inpatient Diabetes Coordinator Pager [phone] (8a-5p)

## 2017-11-13 NOTE — Discharge Summary (Signed)
Discharge Summary    Patient ID: Nina Howell,  MRN: [id number], DOB/AGE: 1940/12/22 77 y.o.  Admit date: 11/10/2017 Discharge date: 11/13/2017  Primary Care Provider: Leeroy Cha Primary Cardiologist: Loralie Champagne, MD  Discharge Diagnoses    Active Problems:   Obstructive sleep apnea   Congestive heart failure (HCC)   Acute on chronic congestive heart failure (HCC)   Pressure injury of skin   Allergies Allergies  Allergen Reactions  . Celebrex [Celecoxib] Swelling  . Penicillins Rash    Has patient had a PCN reaction causing immediate rash, facial/tongue/throat swelling, SOB or lightheadedness with hypotension: Yes Has patient had a PCN reaction causing severe rash involving mucus membranes or skin necrosis: Yes Has patient had a PCN reaction that required hospitalization: No Has patient had a PCN reaction occurring within the last 10 years: Yes - Okay taking other cillin's If all of the above answers are "NO", then may proceed with Cephalosporin use.   . Cephalexin Hives  . Lasix [Furosemide] Hives  . Oxycodone-Acetaminophen Nausea Only  . Cephalosporins Rash  . Oxycodone Anxiety    Felt weird    Diagnostic Studies/Procedures    None _____________   History of Present Illness     Ms. Weight 77 y.o.femalewith history of diastolic CHF, OSA, DM, morbid obesity,andCOPD/asthma presents with complaints leg swelling and worsening shortness of breath. For her OSA she uses a CPAP at home. Her previous echo has documented PA pressure of 56 mmHg and preserved LV function. She is also known to have normal coronaries per a previous cath. In May 2019 she complained of weight gain and SOB despite increased torsemide. Her diuretic was therefore increased to 80mg  bid. She was also given metolazone. However, the patient reports that she reduced her torsemide to 40 mg bid on her own about 2 weeks ago because her swelling of her feet was gone and she was  having trouble getting to the bathroom.  She noted that her weight has been increasing again. She notes more feet swelling. She is unable to climb down the stairs in her home and her husband has to bring the food up to her bedroom from the kitchen. She reported short of breath walking to the bathroom.  Hospital Course     Consultants: Wound care  BNP increased to 2661.5, was 1559.2 one month ago. On torsemide and aldactone 25 mg daily without much diuresis at home after pt decreased her dosing. Pt has allergy to lasix documented, hives. Diuresed with Bumex 4 mg BID and increased spironolactone 50 mg daily while admitted. Now back on torsemide 80 mg am and 40 mg pm. (unknown urine output as pt is incontinent) No longer with any lower extremity edema. Lungs clear. Breathing is much improved, back to her baseline.    Serum creatinine peaked at 1.55. Discharge labs SCr 1.40, K+ 4.0.   Ms. Colledge has multiple skin wounds that are followed outpatient per pt report. We had [facility] nurse consult with recommendations for treatment. Nutritionist also consulted and recommended nutritional supplementation for wound healing.   I will resume her home health for disease management and wound care though [facility]. I have instructed the HHN to draw BMet in 1 week.   The patient has been scheduled for the earliest HF outpatient follow up appt available on 12/03/17. I will arrange for labs to be drawn by Home Health in 1 week.   Patient has been seen by Dr. Johnsie Cancel today and deemed ready for  discharge home. All follow up appointments have been scheduled. Discharge medications are listed below.   _____________  Discharge Vitals Blood pressure 98/67, pulse 89, temperature 97.6 F (36.4 C), temperature source Oral, resp. rate 18, height 5\' 1"  (1.549 m), weight 296 lb (134.3 kg), SpO2 99 %.  Filed Weights   11/11/17 0400 11/12/17 0502 11/13/17 0536  Weight: 291 lb 12.8 oz (132.4 kg) 290 lb (131.5 kg) 296  lb (134.3 kg)    Labs & Radiologic Studies    CBC Recent Labs    11/10/17 2302 11/11/17 0308  WBC 7.3 7.9  NEUTROABS 5.0  --   HGB 12.5 12.0  HCT 38.9 37.7  MCV 91.5 92.2  PLT 178 716   Basic Metabolic Panel Recent Labs    11/12/17 0518 11/13/17 0613  NA 134* 132*  K 4.1 4.0  CL 96* 95*  CO2 25 26  GLUCOSE 247* 350*  BUN 50* 55*  CREATININE 1.32* 1.40*  CALCIUM 9.0 9.4   Liver Function Tests Recent Labs    11/10/17 2302  AST 43*  ALT 41  ALKPHOS 85  BILITOT 0.7  PROT 6.9  ALBUMIN 3.1*   Recent Labs    11/10/17 2302  LIPASE 30   Cardiac Enzymes No results for input(s): CKTOTAL, CKMB, CKMBINDEX, TROPONINI in the last 72 hours. BNP Invalid input(s): POCBNP D-Dimer No results for input(s): DDIMER in the last 72 hours. Hemoglobin A1C No results for input(s): HGBA1C in the last 72 hours. Fasting Lipid Panel No results for input(s): CHOL, HDL, LDLCALC, TRIG, CHOLHDL, LDLDIRECT in the last 72 hours. Thyroid Function Tests No results for input(s): TSH, T4TOTAL, T3FREE, THYROIDAB in the last 72 hours.  Invalid input(s): FREET3 _____________  Dg Chest 2 View  Result Date: 11/10/2017 CLINICAL DATA:  CHF and sleep apnea. Abdominal pain since Friday. Orthopnea at baseline. EXAM: CHEST - 2 VIEW COMPARISON:  04/03/2017 FINDINGS: Stable cardiomegaly with tortuous atherosclerotic aorta. No overt pulmonary edema consolidation. No effusion or pneumothorax. Osteoarthritis of the AC joints bilaterally and marked osteoarthritis with spurring the left glenohumeral joint. IMPRESSION: Cardiomegaly with aortic atherosclerosis. No active pulmonary disease. Electronically Signed   By: Ashley Royalty M.D.   On: 11/10/2017 23:35   Disposition   Pt is being discharged home today in good condition.  Follow-up [facility] Follow up.   Why:  They will continue to do your home health care at your home Contact  information: [place] [address] [phone]        [facility] Follow up.   Specialty:  Cardiology Why:  Cariology- Heart Failure, hospital follow up on 12/03/17 at 11:00. Please arrive 15 minutes early for check in.  Contact information: [address] [id number] [place] [phone]         Discharge Instructions    (HEART FAILURE PATIENTS) Call MD:  Anytime you have any of the following symptoms: 1) 3 pound weight gain in 24 hours or 5 pounds in 1 week 2) shortness of breath, with or without a dry hacking cough 3) swelling in the hands, feet or stomach 4) if you have to sleep on extra pillows at night in order to breathe.   Complete by:  As directed    Diet - low sodium heart healthy   Complete by:  As directed    Discharge instructions  Complete by:  As directed    Continue home health wound care and work on nutrition with adequate protein intake for wound healing.   Increase activity slowly   Complete by:  As directed       Discharge Medications   Allergies as of 11/13/2017      Reactions   Celebrex [celecoxib] Swelling   Penicillins Rash   Has patient had a PCN reaction causing immediate rash, facial/tongue/throat swelling, SOB or lightheadedness with hypotension: Yes Has patient had a PCN reaction causing severe rash involving mucus membranes or skin necrosis: Yes Has patient had a PCN reaction that required hospitalization: No Has patient had a PCN reaction occurring within the last 10 years: Yes - Okay taking other cillin's If all of the above answers are "NO", then may proceed with Cephalosporin use.   Cephalexin Hives   Lasix [furosemide] Hives   Oxycodone-acetaminophen Nausea Only   Cephalosporins Rash   Oxycodone Anxiety   Felt weird      Medication List    TAKE these medications   acetaminophen 500 MG tablet Commonly known as:  TYLENOL Take 1,000  mg every 8 (eight) hours as needed by mouth for mild pain, moderate pain, fever or headache.   ALPRAZolam 0.5 MG tablet Commonly known as:  XANAX Take 0.5 mg by mouth daily as needed for anxiety.   aspirin 81 MG tablet Take 81 mg at bedtime by mouth.   atorvastatin 20 MG tablet Commonly known as:  LIPITOR Take 20 mg by mouth every other day.   clobetasol ointment 0.05 % Commonly known as:  TEMOVATE Apply 1 application topically 2 (two) times daily.   Cranberry 400 MG Tabs Take 1,600 mg daily by mouth.   CREON 36000 UNITS Cpep capsule Generic drug:  lipase/protease/amylase Take 86,761-95,093 Units by mouth See admin instructions. Take 2 capsules with meals and 1 with snacks   fluticasone furoate-vilanterol 200-25 MCG/INH Aepb Commonly known as:  BREO ELLIPTA Inhale 1 puff into the lungs daily. What changed:    when to take this  reasons to take this   guaiFENesin 600 MG 12 hr tablet Commonly known as:  MUCINEX Take 1,200 mg by mouth 2 (two) times daily as needed for cough. Started 10/30   insulin NPH-regular Human (70-30) 100 UNIT/ML injection Commonly known as:  NOVOLIN 70/30 Inject 20 Units into the skin every morning.   levothyroxine 50 MCG tablet Commonly known as:  SYNTHROID, LEVOTHROID Take 50 mcg by mouth daily.   lidocaine 5 % ointment Commonly known as:  XYLOCAINE Apply 1 application 2 (two) times daily topically.   multivitamin with minerals Tabs tablet Take 0.5 tablets by mouth daily.   mupirocin ointment 2 % Commonly known as:  BACTROBAN Place 1 application into the nose 3 (three) times daily.   niacinamide 500 MG tablet Take 500 mg by mouth 2 (two) times daily with a meal.   polyethylene glycol packet Commonly known as:  MIRALAX / GLYCOLAX Take 17 g by mouth daily as needed.   potassium chloride SA 20 MEQ tablet Commonly known as:  K-DUR,KLOR-CON Take 20 mEq by mouth 2 (two) times daily.   PROAIR HFA 108 (90 Base) MCG/ACT inhaler Generic  drug:  albuterol INHALE TWO PUFFS BY MOUTH EVERY 4 HOURS AS NEEDED FOR WHEEZING OR FOR SHORTNESS OF BREATH   sertraline 50 MG tablet Commonly known as:  ZOLOFT Take 100 mg by mouth at bedtime.   spironolactone 25 MG tablet Commonly known as:  ALDACTONE  Take 1 tablet (25 mg total) by mouth daily. Please call [phone] to schedule appointment for additional refills thanks.   SYSTANE OP Apply 1-2 drops to eye 2 (two) times daily as needed (dryness).   torsemide 20 MG tablet Commonly known as:  DEMADEX Take 4 tablets (80 mg total) by mouth 2 (two) times daily. Take 4 tablets in the morning and 2 tablets in the afternoon. What changed:  additional instructions   Vitamin B-12 2500 MCG Subl Place 1 tablet under the tongue daily.   Vitamin D 2000 units tablet Take 2,000 Units daily by mouth.        Acute coronary syndrome (MI, NSTEMI, STEMI, etc) this admission?: No.    Outstanding Labs/Studies   Bmet to be obtained by [facility].   Duration of Discharge Encounter   Greater than 30 minutes including physician time.  Signed, Luna Fuse, NP 11/13/2017, 11:25 AM

## 2017-11-14 ENCOUNTER — Other Ambulatory Visit (HOSPITAL_COMMUNITY): Payer: Self-pay

## 2017-11-14 ENCOUNTER — Telehealth (HOSPITAL_COMMUNITY): Payer: Self-pay | Admitting: Cardiology

## 2017-11-14 DIAGNOSIS — I5032 Chronic diastolic (congestive) heart failure: Secondary | ICD-10-CM | POA: Diagnosis not present

## 2017-11-14 DIAGNOSIS — F329 Major depressive disorder, single episode, unspecified: Secondary | ICD-10-CM | POA: Diagnosis not present

## 2017-11-14 DIAGNOSIS — I872 Venous insufficiency (chronic) (peripheral): Secondary | ICD-10-CM | POA: Diagnosis not present

## 2017-11-14 DIAGNOSIS — E119 Type 2 diabetes mellitus without complications: Secondary | ICD-10-CM | POA: Diagnosis not present

## 2017-11-14 DIAGNOSIS — G4733 Obstructive sleep apnea (adult) (pediatric): Secondary | ICD-10-CM | POA: Diagnosis not present

## 2017-11-14 DIAGNOSIS — I110 Hypertensive heart disease with heart failure: Secondary | ICD-10-CM | POA: Diagnosis not present

## 2017-11-14 DIAGNOSIS — K7460 Unspecified cirrhosis of liver: Secondary | ICD-10-CM | POA: Diagnosis not present

## 2017-11-14 DIAGNOSIS — L120 Bullous pemphigoid: Secondary | ICD-10-CM | POA: Diagnosis not present

## 2017-11-14 DIAGNOSIS — Z7951 Long term (current) use of inhaled steroids: Secondary | ICD-10-CM | POA: Diagnosis not present

## 2017-11-14 DIAGNOSIS — E662 Morbid (severe) obesity with alveolar hypoventilation: Secondary | ICD-10-CM | POA: Diagnosis not present

## 2017-11-14 DIAGNOSIS — J449 Chronic obstructive pulmonary disease, unspecified: Secondary | ICD-10-CM | POA: Diagnosis not present

## 2017-11-14 DIAGNOSIS — I272 Pulmonary hypertension, unspecified: Secondary | ICD-10-CM | POA: Diagnosis not present

## 2017-11-14 DIAGNOSIS — E039 Hypothyroidism, unspecified: Secondary | ICD-10-CM | POA: Diagnosis not present

## 2017-11-14 NOTE — Telephone Encounter (Signed)
Katie called during patient home visit to report hypotension  Reports she cannot hear b/p manually so used patients automatic wrist cuff and reading was 88/30 Patient is asymptomatic  Patient reports her b/p earlier in the morning was 100/54 and b/p was in the 90's during admission.     Patient will have HHPT in afternoon, maybe able to have b/p rechecked  Will forward to provider for further advise regarding b/p

## 2017-11-14 NOTE — Telephone Encounter (Signed)
Have her monitor her BP and if [facility] PT can check also, that would be great. As long as she's asymptomatic and not persistently SBP <90s, I think she is okay. Have her contact us if that changes.

## 2017-11-14 NOTE — Progress Notes (Signed)
Paramedicine Encounter    Patient ID: Nina Howell, female    DOB: [birth year], 77 y.o.   MRN: [id number]   Patient Care Team: Leeroy Cha, MD as PCP - General (Internal Medicine) Larey Dresser, MD as PCP - Cardiology (Cardiology) Delrae Rend, MD as Consulting Physician (Internal Medicine)  Patient Active Problem List   Diagnosis Date Noted  . Pressure injury of skin 11/11/2017  . Chronic respiratory failure with hypoxia ([place]) 04/03/2017  . Pericardial effusion 11/23/2015  . Chronic diastolic heart failure ([place]) 11/23/2015  . Pulmonary hypertension ([place]) 09/06/2015  . Acute on chronic congestive heart failure ([place])   . Rash and nonspecific skin eruption 11/27/2014  . Post-menopausal bleeding 06/18/2011  . Varicose veins 04/25/2011  . Unspecified venous (peripheral) insufficiency 04/25/2011  . Hemangioma of liver, left side 03/23/2011  . HYPOTHYROIDISM 04/27/2010  . Type II or unspecified type diabetes mellitus without mention of complication, not stated as uncontrolled 04/27/2010  . HYPOKALEMIA 04/27/2010  . Morbid obesity ([place]) 04/27/2010  . DEPRESSION 04/27/2010  . Obstructive sleep apnea 04/27/2010  . Essential hypertension 04/27/2010  . Congestive heart failure ([place]) 04/27/2010  . Obesity hypoventilation syndrome ([place]) 04/27/2010    Current Outpatient Medications:  .  acetaminophen (TYLENOL) 500 MG tablet, Take 1,000 mg every 8 (eight) hours as needed by mouth for mild pain, moderate pain, fever or headache., Disp: , Rfl:  .  ALPRAZolam (XANAX) 0.5 MG tablet, Take 0.5 mg by mouth daily as needed for anxiety., Disp: , Rfl:  .  aspirin 81 MG tablet, Take 81 mg by mouth daily. , Disp: , Rfl:  .  atorvastatin (LIPITOR) 20 MG tablet, Take 20 mg by mouth every other day., Disp: , Rfl:  .  Cholecalciferol (VITAMIN D) 2000 units tablet, Take 2,000 Units daily by mouth., Disp: , Rfl:  .  clobetasol ointment (TEMOVATE) 9.67 %, Apply 1 application topically 2  (two) times daily., Disp: , Rfl:  .  Cranberry 400 MG TABS, Take 1,600 mg daily by mouth., Disp: , Rfl:  .  Cyanocobalamin (VITAMIN B-12) 2500 MCG SUBL, Place 1 tablet under the tongue daily., Disp: , Rfl:  .  doxycycline (VIBRA-TABS) 100 MG tablet, Take 100 mg by mouth 2 (two) times daily., Disp: , Rfl:  .  fluticasone furoate-vilanterol (BREO ELLIPTA) 200-25 MCG/INH AEPB, Inhale 1 puff into the lungs daily. (Patient taking differently: Inhale 1 puff into the lungs daily as needed (SOB). ), Disp: 1 each, Rfl: 3 .  insulin NPH-regular Human (NOVOLIN 70/30) (70-30) 100 UNIT/ML injection, Inject 20 Units into the skin every morning. , Disp: , Rfl:  .  levothyroxine (SYNTHROID, LEVOTHROID) 50 MCG tablet, Take 50 mcg by mouth daily.  , Disp: , Rfl:  .  lidocaine (XYLOCAINE) 5 % ointment, Apply 1 application 2 (two) times daily topically. , Disp: , Rfl:  .  lipase/protease/amylase (CREON) 36000 UNITS CPEP capsule, Take 36,000-72,000 Units by mouth See admin instructions. Take 2 capsules with meals and 1 with snacks, Disp: , Rfl:  .  Multiple Vitamin (MULTIVITAMIN WITH MINERALS) TABS tablet, Take 0.5 tablets by mouth daily., Disp: , Rfl:  .  mupirocin ointment (BACTROBAN) 2 %, Place 1 application into the nose 3 (three) times daily., Disp: , Rfl:  .  niacinamide 500 MG tablet, Take 500 mg by mouth 2 (two) times daily with a meal., Disp: , Rfl:  .  Polyethyl Glycol-Propyl Glycol (SYSTANE OP), Apply 1-2 drops to eye 2 (two) times daily as needed (dryness)., Disp: , Rfl:  .  polyethylene glycol (MIRALAX / GLYCOLAX) packet, Take 17 g by mouth daily as needed., Disp: 14 each, Rfl: 0 .  potassium chloride SA (K-DUR,KLOR-CON) 20 MEQ tablet, Take 20 mEq by mouth 2 (two) times daily. , Disp: , Rfl:  .  PROAIR HFA 108 (90 Base) MCG/ACT inhaler, INHALE TWO PUFFS BY MOUTH EVERY 4 HOURS AS NEEDED FOR WHEEZING OR FOR SHORTNESS OF BREATH, Disp: 1 Inhaler, Rfl: 2 .  sertraline (ZOLOFT) 50 MG tablet, Take 100 mg by mouth  at bedtime. , Disp: , Rfl:  .  spironolactone (ALDACTONE) 25 MG tablet, Take 1 tablet (25 mg total) by mouth daily. Please call [phone] to schedule appointment for additional refills thanks., Disp: 15 tablet, Rfl: 0 .  torsemide (DEMADEX) 20 MG tablet, Take 4 tablets (80 mg total) by mouth daily., Disp: 120 tablet, Rfl: 5 .  torsemide (DEMADEX) 20 MG tablet, Take 2 tablets (40 mg total) by mouth every evening., Disp: 60 tablet, Rfl: 5 .  guaiFENesin (MUCINEX) 600 MG 12 hr tablet, Take 1,200 mg by mouth 2 (two) times daily as needed for cough. Started 10/30 , Disp: , Rfl:  Allergies  Allergen Reactions  . Celebrex [Celecoxib] Swelling  . Penicillins Rash    Has patient had a PCN reaction causing immediate rash, facial/tongue/throat swelling, SOB or lightheadedness with hypotension: Yes Has patient had a PCN reaction causing severe rash involving mucus membranes or skin necrosis: Yes Has patient had a PCN reaction that required hospitalization: No Has patient had a PCN reaction occurring within the last 10 years: Yes - Okay taking other cillin's If all of the above answers are "NO", then may proceed with Cephalosporin use.   . Cephalexin Hives  . Lasix [Furosemide] Hives  . Oxycodone-Acetaminophen Nausea Only  . Cephalosporins Rash  . Oxycodone Anxiety    Felt weird      Social History   Socioeconomic History  . Marital status: Married    Spouse name: Not on file  . Number of children: 4  . Years of education: Not on file  . Highest education level: Not on file  Occupational History  . 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 Financial resource strain: Patient refused  . Food insecurity:    Worry: Patient refused    Inability: Patient refused  . Transportation needs:    Medical: Patient refused    Non-medical: Patient refused  Tobacco Use  . Smoking status: Never Smoker  . Smokeless tobacco: Never Used  Substance and Sexual  Activity  . Alcohol use: No  . Drug use: No  . Sexual activity: Yes    Birth control/protection: IUD  Lifestyle  . Physical activity:    Days per week: Patient refused    Minutes per session: Patient refused  . Stress: Patient refused  Relationships  . Social connections:    Talks on phone: Patient refused    Gets together: Patient refused    Attends religious service: Patient refused    Active member of club or organization: Patient refused    Attends meetings of clubs or organizations: Patient refused    Relationship status: Patient refused  . Intimate partner violence:    Fear of current or ex partner: Patient refused    Emotionally abused: Patient refused    Physically abused: Patient refused    Forced sexual activity: Patient refused  Other Topics Concern  . Not on file  Social History Narrative  .  Not on file    Physical Exam      Future Appointments  Date Time Provider [place]  12/03/2017 11:00 AM [facility] PA/NP [facility] None    BP (!) 88/30   Pulse 78   Resp 14   Wt 292 lb (132.5 kg)   SpO2 98%   BMI 55.17 kg/m   Weight yesterday-? Last visit weight-287 CBG PTA-230 CBG EMS-289  Pt just home from hosp yesterday, she states she does feel better however she gained 5lbs while being in the hosp. She denies any increased weight gain prior to this admission, she states she was getting sob and had to go ER by 911.  [facility]-she ordered the atorvastatin--it is placed in Saturday and Monday- We dumped out her pill box and I refilled it. She started a new rx of creon-she has samples right now and a discount card to get it--she has enough for this week only.  pts doxy was not listed in epic, she reports her derm doc prescribed it for her and wants her to continue to take it.  Her torsemide filled as 80mg  in am and 40mg  in pm.  She states her weight did not fluctuate but she noticed her feet/ankle swelling and then the sob  started. Her b/p is low--contacted clinic to let them know. When she checked it this morning, it was 119/. Her PT first visit is sch this afternoon today and I advised her to be sure to get the PT to recheck her b/p-pt denies any complaints at this time.  Pt reports taking her insulin however she does not eat much during the day.  Will go back early next week for f/u visit.    [name], [facility] Paramedic  11/14/17

## 2017-11-15 NOTE — Telephone Encounter (Signed)
Called to follow up with patient regarding blood pressure Patient reports she does check weights and b/p daily B/p today 11/79  Advised to continue to monitor, be sure to keep a record and call us to follow up next week with blood pressure readings, if she continues to get SBP readings below 90, may need to return for nurse visit for further medication changes  patient reports understanding

## 2017-11-18 ENCOUNTER — Telehealth (HOSPITAL_COMMUNITY): Payer: Self-pay | Admitting: *Deleted

## 2017-11-18 ENCOUNTER — Other Ambulatory Visit: Payer: Self-pay

## 2017-11-18 DIAGNOSIS — J449 Chronic obstructive pulmonary disease, unspecified: Secondary | ICD-10-CM | POA: Diagnosis not present

## 2017-11-18 DIAGNOSIS — L120 Bullous pemphigoid: Secondary | ICD-10-CM | POA: Diagnosis not present

## 2017-11-18 DIAGNOSIS — G4733 Obstructive sleep apnea (adult) (pediatric): Secondary | ICD-10-CM | POA: Diagnosis not present

## 2017-11-18 DIAGNOSIS — I509 Heart failure, unspecified: Secondary | ICD-10-CM | POA: Diagnosis not present

## 2017-11-18 NOTE — Patient Outreach (Signed)
Sent message to [place] to see the patient while she is in the hospital.

## 2017-11-18 NOTE — Telephone Encounter (Signed)
PT called c/o 4lb weight gain over night and a lot of swelling in legs and feet. Per Amy she can take metolazone today with 59meq of K. Call tomorrow if symptoms worsen. PT aware and agreeable.

## 2017-11-18 NOTE — Patient Outreach (Addendum)
[facility]) Care Management  11/18/2017  Nina Howell 06-09-1940 [id number]     EMMI-HF RED ON EMMI ALERT Day #1 Date: 11/15/17 Red Alert Reason: " Have a way to get to follow up appt? No" Day: 2 Date: 11/16/17 New/worsening problems? Yes New swelling? Yes New/worsening SOB? Yes" Day: 3 Date: 11/17/17 Red Alert Reason: Weight: 200 lbs, New swelling? Yes"  Outreach attempt # 1 to patient. Spoke with patient. Reviewed and addressed red alert. Patient became a little agitated and voiced that she has a car and has a spouse who is able to get her to appts. RN CM repeated question and asked patient multiple times and multiple ways regarding transportation and she denied needing assistance. She confirmed that her MD f/u appt is on 12/03/17 with cardiologist. She voices that she has not made PCP appt and does not plan to do so. Patient adamant that her issues right now are related to HF and that making an appt with PCP is not necessary despite RN CM trying to explain guidelines for PCP follow up appt following hospitalization as well. Patient voices that she was getting [facility] prior to hospital stay and was told services would be resumed. However, she has not been contacted by agency since discharge. Patient voices that she is getting a call every day from [facility] HF program. She voices she has been enrolled in their program for almost ten months. Program is similar to EMMI-HF program. Patient overwhelmed by number of calls and disciplines trying to help her. She wishes for automated calls to stop. Patient voices that she is having issues managing her blood sugars since discharge from hospital. She reports that cbgs prior to admission were in the 80s-100s. Since return home they are ranging in the 215s-240s. She voices she is taking her insulin as directed and has had no change in diet regimen. She has not contacted MD office and unable to provide a reason why. RN CM encouraged patient to  notify MD office of cbgs. Patient voices she is also having issues with her "feet swelling." She voices that this is new and was not happening prior to hospital stay. Patient stets that she is "just all messed up" since returning home and seems confused and overwhelmed about managing her care. Patient agreeable to RN CM assistance. Patient unable to complete further screening and assessment-very difficult trying to obtain info from her as she stated she was too overwhelmed at the moment and did not feel good. She states that her dtr-Angeal could be contacted to discuss her case if needed but dtr currently at work.       Plan: RN CM called [name] office to follow up on patient status. Spoke with Aldona Bar and Thayer Headings. Confirmed that [facility] services were active and resumed. Per records HHPT was out to see patient last week(which patient did not mention to RN CM). Advised that [facility] would be out tomorrow to assess patient.  RN CM called patient back and relayed this info to her.  RN CM will send [facility] community nurse referral for further in home eval/assessment of care needs and assistance with symptom mgmt and mgmt of chronic conditions.  RN CM will notify [facility] administrative assistant to deactivate automated EMMI HF calls.    Enzo Montgomery, RN,BSN,CCM [facility] Telephonic Care Management Coordinator Direct Phone: [phone] Toll Free: [phone] Fax: [phone]

## 2017-11-19 ENCOUNTER — Other Ambulatory Visit (HOSPITAL_COMMUNITY): Payer: Self-pay

## 2017-11-19 DIAGNOSIS — J449 Chronic obstructive pulmonary disease, unspecified: Secondary | ICD-10-CM | POA: Diagnosis not present

## 2017-11-19 DIAGNOSIS — I5032 Chronic diastolic (congestive) heart failure: Secondary | ICD-10-CM | POA: Diagnosis not present

## 2017-11-19 DIAGNOSIS — I872 Venous insufficiency (chronic) (peripheral): Secondary | ICD-10-CM | POA: Diagnosis not present

## 2017-11-19 DIAGNOSIS — K7460 Unspecified cirrhosis of liver: Secondary | ICD-10-CM | POA: Diagnosis not present

## 2017-11-19 DIAGNOSIS — E119 Type 2 diabetes mellitus without complications: Secondary | ICD-10-CM | POA: Diagnosis not present

## 2017-11-19 DIAGNOSIS — L120 Bullous pemphigoid: Secondary | ICD-10-CM | POA: Diagnosis not present

## 2017-11-19 DIAGNOSIS — I110 Hypertensive heart disease with heart failure: Secondary | ICD-10-CM | POA: Diagnosis not present

## 2017-11-19 DIAGNOSIS — I272 Pulmonary hypertension, unspecified: Secondary | ICD-10-CM | POA: Diagnosis not present

## 2017-11-19 DIAGNOSIS — F329 Major depressive disorder, single episode, unspecified: Secondary | ICD-10-CM | POA: Diagnosis not present

## 2017-11-19 DIAGNOSIS — Z7951 Long term (current) use of inhaled steroids: Secondary | ICD-10-CM | POA: Diagnosis not present

## 2017-11-19 DIAGNOSIS — G4733 Obstructive sleep apnea (adult) (pediatric): Secondary | ICD-10-CM | POA: Diagnosis not present

## 2017-11-19 DIAGNOSIS — E662 Morbid (severe) obesity with alveolar hypoventilation: Secondary | ICD-10-CM | POA: Diagnosis not present

## 2017-11-19 DIAGNOSIS — E039 Hypothyroidism, unspecified: Secondary | ICD-10-CM | POA: Diagnosis not present

## 2017-11-19 NOTE — Progress Notes (Signed)
Paramedicine Encounter    Patient ID: Nina Howell, female    DOB: 05-24-1940, 77 y.o.   MRN: [id number]   Patient Care Team: Leeroy Cha, MD as PCP - General (Internal Medicine) Larey Dresser, MD as PCP - Cardiology (Cardiology) Delrae Rend, MD as Consulting Physician (Internal Medicine) Luretha Rued, RN as [place] Management  Patient Active Problem List   Diagnosis Date Noted  . Pressure injury of skin 11/11/2017  . Chronic respiratory failure with hypoxia ([place]) 04/03/2017  . Pericardial effusion 11/23/2015  . Chronic diastolic heart failure ([place]) 11/23/2015  . Pulmonary hypertension ([place]) 09/06/2015  . Acute on chronic congestive heart failure ([place])   . Rash and nonspecific skin eruption 11/27/2014  . Post-menopausal bleeding 06/18/2011  . Varicose veins 04/25/2011  . Unspecified venous (peripheral) insufficiency 04/25/2011  . Hemangioma of liver, left side 03/23/2011  . HYPOTHYROIDISM 04/27/2010  . Type II or unspecified type diabetes mellitus without mention of complication, not stated as uncontrolled 04/27/2010  . HYPOKALEMIA 04/27/2010  . Morbid obesity ([place]) 04/27/2010  . DEPRESSION 04/27/2010  . Obstructive sleep apnea 04/27/2010  . Essential hypertension 04/27/2010  . Congestive heart failure ([place]) 04/27/2010  . Obesity hypoventilation syndrome ([place]) 04/27/2010    Current Outpatient Medications:  .  acetaminophen (TYLENOL) 500 MG tablet, Take 1,000 mg every 8 (eight) hours as needed by mouth for mild pain, moderate pain, fever or headache., Disp: , Rfl:  .  ALPRAZolam (XANAX) 0.5 MG tablet, Take 0.5 mg by mouth daily as needed for anxiety., Disp: , Rfl:  .  aspirin 81 MG tablet, Take 81 mg by mouth daily. , Disp: , Rfl:  .  atorvastatin (LIPITOR) 20 MG tablet, Take 20 mg by mouth every other day. , Disp: , Rfl:  .  Cholecalciferol (VITAMIN D) 2000 units tablet, Take 2,000 Units daily by mouth., Disp: , Rfl:  .   clobetasol ointment (TEMOVATE) 7.03 %, Apply 1 application topically 2 (two) times daily., Disp: , Rfl:  .  Cranberry 400 MG TABS, Take 1,600 mg daily by mouth., Disp: , Rfl:  .  Cyanocobalamin (VITAMIN B-12) 2500 MCG SUBL, Place 1 tablet under the tongue daily., Disp: , Rfl:  .  doxycycline (VIBRA-TABS) 100 MG tablet, Take 100 mg by mouth 2 (two) times daily., Disp: , Rfl:  .  fluticasone furoate-vilanterol (BREO ELLIPTA) 200-25 MCG/INH AEPB, Inhale 1 puff into the lungs daily. (Patient taking differently: Inhale 1 puff into the lungs daily as needed (SOB). ), Disp: 1 each, Rfl: 3 .  guaiFENesin (MUCINEX) 600 MG 12 hr tablet, Take 1,200 mg by mouth 2 (two) times daily as needed for cough. Started 10/30 , Disp: , Rfl:  .  insulin NPH-regular Human (NOVOLIN 70/30) (70-30) 100 UNIT/ML injection, Inject 20 Units into the skin every morning. , Disp: , Rfl:  .  levothyroxine (SYNTHROID, LEVOTHROID) 50 MCG tablet, Take 50 mcg by mouth daily.  , Disp: , Rfl:  .  lidocaine (XYLOCAINE) 5 % ointment, Apply 1 application 2 (two) times daily topically. , Disp: , Rfl:  .  lipase/protease/amylase (CREON) 36000 UNITS CPEP capsule, Take 36,000-72,000 Units by mouth See admin instructions. Take 2 capsules with meals and 1 with snacks, Disp: , Rfl:  .  Multiple Vitamin (MULTIVITAMIN WITH MINERALS) TABS tablet, Take 0.5 tablets by mouth daily., Disp: , Rfl:  .  mupirocin ointment (BACTROBAN) 2 %, Place 1 application into the nose 3 (three) times daily., Disp: , Rfl:  .  niacinamide 500  MG tablet, Take 500 mg by mouth 2 (two) times daily with a meal., Disp: , Rfl:  .  Polyethyl Glycol-Propyl Glycol (SYSTANE OP), Apply 1-2 drops to eye 2 (two) times daily as needed (dryness)., Disp: , Rfl:  .  polyethylene glycol (MIRALAX / GLYCOLAX) packet, Take 17 g by mouth daily as needed., Disp: 14 each, Rfl: 0 .  potassium chloride SA (K-DUR,KLOR-CON) 20 MEQ tablet, Take 20 mEq by mouth 2 (two) times daily. , Disp: , Rfl:  .   PROAIR HFA 108 (90 Base) MCG/ACT inhaler, INHALE TWO PUFFS BY MOUTH EVERY 4 HOURS AS NEEDED FOR WHEEZING OR FOR SHORTNESS OF BREATH, Disp: 1 Inhaler, Rfl: 2 .  sertraline (ZOLOFT) 50 MG tablet, Take 100 mg by mouth at bedtime. , Disp: , Rfl:  .  spironolactone (ALDACTONE) 25 MG tablet, Take 1 tablet (25 mg total) by mouth daily. Please call [phone] to schedule appointment for additional refills thanks., Disp: 15 tablet, Rfl: 0 .  torsemide (DEMADEX) 20 MG tablet, Take 4 tablets (80 mg total) by mouth daily., Disp: 120 tablet, Rfl: 5 .  torsemide (DEMADEX) 20 MG tablet, Take 2 tablets (40 mg total) by mouth every evening., Disp: 60 tablet, Rfl: 5 Allergies  Allergen Reactions  . Celebrex [Celecoxib] Swelling  . Penicillins Rash    Has patient had a PCN reaction causing immediate rash, facial/tongue/throat swelling, SOB or lightheadedness with hypotension: Yes Has patient had a PCN reaction causing severe rash involving mucus membranes or skin necrosis: Yes Has patient had a PCN reaction that required hospitalization: No Has patient had a PCN reaction occurring within the last 10 years: Yes - Okay taking other cillin's If all of the above answers are "NO", then may proceed with Cephalosporin use.   . Cephalexin Hives  . Lasix [Furosemide] Hives  . Oxycodone-Acetaminophen Nausea Only  . Cephalosporins Rash  . Oxycodone Anxiety    Felt weird      Social History   Socioeconomic History  . Marital status: Married    Spouse name: Not on file  . Number of children: 4  . Years of education: Not on file  . Highest education level: Not on file  Occupational History  . 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 Financial resource strain: Patient refused  . Food insecurity:    Worry: Patient refused    Inability: Patient refused  . Transportation needs:    Medical: Patient refused    Non-medical: Patient refused  Tobacco Use  . Smoking  status: Never Smoker  . Smokeless tobacco: Never Used  Substance and Sexual Activity  . Alcohol use: No  . Drug use: No  . Sexual activity: Yes    Birth control/protection: IUD  Lifestyle  . Physical activity:    Days per week: Patient refused    Minutes per session: Patient refused  . Stress: Patient refused  Relationships  . Social connections:    Talks on phone: Patient refused    Gets together: Patient refused    Attends religious service: Patient refused    Active member of club or organization: Patient refused    Attends meetings of clubs or organizations: Patient refused    Relationship status: Patient refused  . Intimate partner violence:    Fear of current or ex partner: Patient refused    Emotionally abused: Patient refused    Physically abused: Patient refused    Forced sexual activity: Patient refused  Other Topics Concern  . Not on file  Social History Narrative  . Not on file    Physical Exam      Future Appointments  Date Time Provider [place]  12/03/2017 11:00 AM [facility] PA/NP [facility] None    BP (!) 94/0   Pulse 80   Resp 15   Wt 293 lb (132.9 kg)   SpO2 98%   BMI 55.36 kg/m   Weight yesterday-295 Last visit weight-292  Pt reports she is doing ok, no sob, pt states she took metolazone yesterday due to weight gain. Pt reports her urine output has increased and she feels a tad better.  meds verified and pill box refilled. She did not take all of her creon last week. She has no more of that med. Her b/p I got was palpated--according to her machine this morning it was 115/systolic. Home health nurse was just leaving when I arrived. Will see her next week,    [name], [place] Paramedic  11/19/17

## 2017-11-20 ENCOUNTER — Other Ambulatory Visit: Payer: Self-pay

## 2017-11-20 DIAGNOSIS — I5032 Chronic diastolic (congestive) heart failure: Secondary | ICD-10-CM | POA: Diagnosis not present

## 2017-11-20 DIAGNOSIS — I872 Venous insufficiency (chronic) (peripheral): Secondary | ICD-10-CM | POA: Diagnosis not present

## 2017-11-20 DIAGNOSIS — Z7951 Long term (current) use of inhaled steroids: Secondary | ICD-10-CM | POA: Diagnosis not present

## 2017-11-20 DIAGNOSIS — I110 Hypertensive heart disease with heart failure: Secondary | ICD-10-CM | POA: Diagnosis not present

## 2017-11-20 DIAGNOSIS — I272 Pulmonary hypertension, unspecified: Secondary | ICD-10-CM | POA: Diagnosis not present

## 2017-11-20 DIAGNOSIS — E119 Type 2 diabetes mellitus without complications: Secondary | ICD-10-CM | POA: Diagnosis not present

## 2017-11-20 DIAGNOSIS — F329 Major depressive disorder, single episode, unspecified: Secondary | ICD-10-CM | POA: Diagnosis not present

## 2017-11-20 DIAGNOSIS — K7460 Unspecified cirrhosis of liver: Secondary | ICD-10-CM | POA: Diagnosis not present

## 2017-11-20 DIAGNOSIS — E039 Hypothyroidism, unspecified: Secondary | ICD-10-CM | POA: Diagnosis not present

## 2017-11-20 DIAGNOSIS — J449 Chronic obstructive pulmonary disease, unspecified: Secondary | ICD-10-CM | POA: Diagnosis not present

## 2017-11-20 DIAGNOSIS — L120 Bullous pemphigoid: Secondary | ICD-10-CM | POA: Diagnosis not present

## 2017-11-20 DIAGNOSIS — G4733 Obstructive sleep apnea (adult) (pediatric): Secondary | ICD-10-CM | POA: Diagnosis not present

## 2017-11-20 DIAGNOSIS — E662 Morbid (severe) obesity with alveolar hypoventilation: Secondary | ICD-10-CM | POA: Diagnosis not present

## 2017-11-20 NOTE — Patient Outreach (Signed)
[facility]) Care Management  11/20/2017  Nikala Walsworth April 23, 1941 [id number]   Referral received 11/18/17 from telephonic care coordinator.   77 year old with history of heart failure, diabeted, sleep apnea. RNCM called to schedule home visit. Client states that she has a lot of people coming to see her and calling her. She expresses being overwhelmed with calls. She states physical therapist saw her today and the "advanced care nurse" saw her yesterday. She state she has paramedcine who has been to see her this week.  RNCM discussed care management services with client and informed her that RNCM works paramedicine to assist in her health care. Client agreed to have RNCM make home visit with paramedicine.  RNCM called and left voice message with [name].   Plan: care coordination with paramedicine for home visit within the next 1-2 weeks.  [name], RN, MSN, [place] Coordinator Cell: [phone]

## 2017-11-25 ENCOUNTER — Telehealth (HOSPITAL_COMMUNITY): Payer: Self-pay | Admitting: *Deleted

## 2017-11-25 DIAGNOSIS — I5032 Chronic diastolic (congestive) heart failure: Secondary | ICD-10-CM | POA: Diagnosis not present

## 2017-11-25 DIAGNOSIS — K7460 Unspecified cirrhosis of liver: Secondary | ICD-10-CM | POA: Diagnosis not present

## 2017-11-25 DIAGNOSIS — I110 Hypertensive heart disease with heart failure: Secondary | ICD-10-CM | POA: Diagnosis not present

## 2017-11-25 DIAGNOSIS — L120 Bullous pemphigoid: Secondary | ICD-10-CM | POA: Diagnosis not present

## 2017-11-25 DIAGNOSIS — I872 Venous insufficiency (chronic) (peripheral): Secondary | ICD-10-CM | POA: Diagnosis not present

## 2017-11-25 DIAGNOSIS — Z7951 Long term (current) use of inhaled steroids: Secondary | ICD-10-CM | POA: Diagnosis not present

## 2017-11-25 DIAGNOSIS — F329 Major depressive disorder, single episode, unspecified: Secondary | ICD-10-CM | POA: Diagnosis not present

## 2017-11-25 DIAGNOSIS — E662 Morbid (severe) obesity with alveolar hypoventilation: Secondary | ICD-10-CM | POA: Diagnosis not present

## 2017-11-25 DIAGNOSIS — J449 Chronic obstructive pulmonary disease, unspecified: Secondary | ICD-10-CM | POA: Diagnosis not present

## 2017-11-25 DIAGNOSIS — G4733 Obstructive sleep apnea (adult) (pediatric): Secondary | ICD-10-CM | POA: Diagnosis not present

## 2017-11-25 DIAGNOSIS — E039 Hypothyroidism, unspecified: Secondary | ICD-10-CM | POA: Diagnosis not present

## 2017-11-25 DIAGNOSIS — I272 Pulmonary hypertension, unspecified: Secondary | ICD-10-CM | POA: Diagnosis not present

## 2017-11-25 DIAGNOSIS — E119 Type 2 diabetes mellitus without complications: Secondary | ICD-10-CM | POA: Diagnosis not present

## 2017-11-25 NOTE — Telephone Encounter (Signed)
Nina Howell [facility] RN called to report pts 3lb weight gain overnight, she is SOB w/ +3 pitting BLEE. She has difficulty breathing weight currently 295lbs. Per Jonni Sanger increase Torsemide to 80mg  twice daily until her office visit next week and take one dose of metolazone today. Nina Howell is aware and agreeable with plan.

## 2017-11-26 ENCOUNTER — Other Ambulatory Visit: Payer: Self-pay

## 2017-11-26 NOTE — Patient Outreach (Signed)
[facility]) Care Management  11/26/2017  Breana Litts [birth year] [id number]   Care Coordination: RNCM received return call from [name] with Paramedicine. Discussed possible co-visit as client expresses being overwhelmed with people coming into her home. Ms. Donnal Debar attempted to see client today, but client was not available. Ms Donnal Debar to follow up again with client tomorrow and will update RNCM of date/time of next appointment.   Plan: RNCM will attempt to complete home visit with Ms Donnal Debar this week if the timing does not conflict with RNCM's scheduled. Continue to follow.  [name], RN, MSN, [place] Coordinator Cell: [phone]

## 2017-11-27 ENCOUNTER — Telehealth (HOSPITAL_COMMUNITY): Payer: Self-pay

## 2017-11-27 NOTE — Telephone Encounter (Signed)
Our visits are normally on Tuesday to stay on track with the pill box refilling--I called pt to see what time I could come out Tuesday however pt stated she wasn't feeling well as her CBG is running elevated, her home aide is having to bathe her in the bed and that is more time consuming and Tuesday wasn't a good day for me to come out and she wanted me to call back on Wednesday.   I called her back today this morning, no answer....i called back later in the day and her husband answered the phone stating she was asleep. He said she still wasn't feeling well, I asked if I needed to come out to see her to check on her and he said no. I told him when she woke up if she changed her mind then call me and I could come visit today. If I dont hear from her today then I will call again tomorrow.   [name], EMT-Paramedic  11/27/17

## 2017-11-29 ENCOUNTER — Other Ambulatory Visit: Payer: Self-pay

## 2017-11-29 NOTE — Patient Outreach (Signed)
[facility]) Care Management  11/29/2017  Nina Howell 1941/03/30 [id number]   Referral received 11/18/17 from telephonic care coordinator.   77 year old with history of heart failure, diabeted, sleep apnea. RNCM called to schedule home visit. Client states that she has a lot of people coming to see her and calling her. She expresses being overwhelmed with calls. She states physical therapist saw her today and the "advanced care nurse" saw her yesterday. She state she has paramedcine who has been to see her this week.  RNCM called to follow up. no answer. HIPPA compliant message left.  Plan: send outreach letter and follow up within 3-4 business days if no return call.  [name], RN, MSN, [place] Coordinator Cell: [phone]

## 2017-12-02 ENCOUNTER — Telehealth (HOSPITAL_COMMUNITY): Payer: Self-pay

## 2017-12-02 ENCOUNTER — Telehealth (HOSPITAL_COMMUNITY): Payer: Self-pay | Admitting: *Deleted

## 2017-12-02 NOTE — Telephone Encounter (Signed)
Patients husband called stating patient is extremely short of breath, very swollen, and a wound on her leg is bleeding "a lot". He wants to cancel her appt with the clinic tomorrow because he said he cant get her out the house. He said she cant walk because her feet are so swollen. I advised him she needed to be seen and if he couldn't bring her here then he should call EMS and have her brought to the ED.  I spoke with Katie with paramedicine and she agrees patiently probably needs to be admitted. pts husband agrees to have her brought to the hospital either today or tomorrow. I encouraged him to bring her to the hospital today.

## 2017-12-02 NOTE — Telephone Encounter (Signed)
Pt's husband answered the phone and advised she was sleeping and wasn't able to talk. He reports she isnt feeling good at all, her legs are swollen and painful, she can hardly walk to the bathroom and isnt able to make it to appointment tomor. He also reported that the wounds on her legs started bleeding a lot last night. I encouraged him to get her to doc tomor. I advised him we could get EMS and fire out there again to help her get down the stairs and asked him to talk with her about that option and she needed to be seen if at all possible. I will call him back shortly to see if he was able to speak with her about that.   [name], EMT-Paramedic  12/02/17

## 2017-12-03 ENCOUNTER — Telehealth (HOSPITAL_COMMUNITY): Payer: Self-pay

## 2017-12-03 ENCOUNTER — Encounter (HOSPITAL_COMMUNITY): Payer: BLUE CROSS/BLUE SHIELD

## 2017-12-03 ENCOUNTER — Other Ambulatory Visit (HOSPITAL_COMMUNITY): Payer: Self-pay | Admitting: *Deleted

## 2017-12-03 NOTE — Telephone Encounter (Signed)
I was able to reach pts home health primary nurse, sherry bolton, her other nurse tiara was a PRN visit and wasn't familiar with pt enough to coordinate care. Judeen Hammans advised that pt has been difficult to reach for home visits as well. She is planned to see her sometime Thursday. She is suppose to call me to let me know what time and I am going to tag along with her as I have had no luck being able to see her this week. I have called numerous times and about 1 in 3 calls husband will answer and say she is sleeping-I ask to have her call me when she wakes up and I dont get a return call. Judeen Hammans advised same issues. I have asked several times when I can come see her and husband keeps saying it isnt a good day and will say he will consult with her and I never hear back, I advised him that's when she needs to be seen especially when she doesn't feel well--she was also advised she needed to go to ER with her multiple complaints of wound bleeding, legs/ankes swollen, CBG running elevated, increased sob, tired. But they have not went to ER yet.     [name], EMT-Paramedic  12/03/17

## 2017-12-04 ENCOUNTER — Other Ambulatory Visit (HOSPITAL_COMMUNITY): Payer: Self-pay

## 2017-12-04 DIAGNOSIS — L120 Bullous pemphigoid: Secondary | ICD-10-CM | POA: Diagnosis not present

## 2017-12-04 DIAGNOSIS — K7460 Unspecified cirrhosis of liver: Secondary | ICD-10-CM | POA: Diagnosis not present

## 2017-12-04 DIAGNOSIS — E119 Type 2 diabetes mellitus without complications: Secondary | ICD-10-CM | POA: Diagnosis not present

## 2017-12-04 DIAGNOSIS — F329 Major depressive disorder, single episode, unspecified: Secondary | ICD-10-CM | POA: Diagnosis not present

## 2017-12-04 DIAGNOSIS — E039 Hypothyroidism, unspecified: Secondary | ICD-10-CM | POA: Diagnosis not present

## 2017-12-04 DIAGNOSIS — I5033 Acute on chronic diastolic (congestive) heart failure: Secondary | ICD-10-CM | POA: Diagnosis not present

## 2017-12-04 DIAGNOSIS — E662 Morbid (severe) obesity with alveolar hypoventilation: Secondary | ICD-10-CM | POA: Diagnosis not present

## 2017-12-04 DIAGNOSIS — I872 Venous insufficiency (chronic) (peripheral): Secondary | ICD-10-CM | POA: Diagnosis not present

## 2017-12-04 DIAGNOSIS — G4733 Obstructive sleep apnea (adult) (pediatric): Secondary | ICD-10-CM | POA: Diagnosis not present

## 2017-12-04 DIAGNOSIS — J449 Chronic obstructive pulmonary disease, unspecified: Secondary | ICD-10-CM | POA: Diagnosis not present

## 2017-12-04 DIAGNOSIS — Z7951 Long term (current) use of inhaled steroids: Secondary | ICD-10-CM | POA: Diagnosis not present

## 2017-12-04 DIAGNOSIS — I272 Pulmonary hypertension, unspecified: Secondary | ICD-10-CM | POA: Diagnosis not present

## 2017-12-04 DIAGNOSIS — I110 Hypertensive heart disease with heart failure: Secondary | ICD-10-CM | POA: Diagnosis not present

## 2017-12-05 ENCOUNTER — Other Ambulatory Visit: Payer: Self-pay

## 2017-12-05 DIAGNOSIS — I5040 Unspecified combined systolic (congestive) and diastolic (congestive) heart failure: Secondary | ICD-10-CM | POA: Diagnosis not present

## 2017-12-05 NOTE — Progress Notes (Signed)
Paramedicine Encounter    Patient ID: Nina Howell, female    DOB: November 06, 1940, 77 y.o.   MRN: [id number]   Patient Care Team: Leeroy Cha, MD as PCP - General (Internal Medicine) Larey Dresser, MD as PCP - Cardiology (Cardiology) Delrae Rend, MD as Consulting Physician (Internal Medicine) Luretha Rued, RN as [place] Management  Patient Active Problem List   Diagnosis Date Noted  . Pressure injury of skin 11/11/2017  . Chronic respiratory failure with hypoxia ([place]) 04/03/2017  . Pericardial effusion 11/23/2015  . Chronic diastolic heart failure ([place]) 11/23/2015  . Pulmonary hypertension ([place]) 09/06/2015  . Acute on chronic congestive heart failure ([place])   . Rash and nonspecific skin eruption 11/27/2014  . Post-menopausal bleeding 06/18/2011  . Varicose veins 04/25/2011  . Unspecified venous (peripheral) insufficiency 04/25/2011  . Hemangioma of liver, left side 03/23/2011  . HYPOTHYROIDISM 04/27/2010  . Type II or unspecified type diabetes mellitus without mention of complication, not stated as uncontrolled 04/27/2010  . HYPOKALEMIA 04/27/2010  . Morbid obesity ([place]) 04/27/2010  . DEPRESSION 04/27/2010  . Obstructive sleep apnea 04/27/2010  . Essential hypertension 04/27/2010  . Congestive heart failure ([place]) 04/27/2010  . Obesity hypoventilation syndrome ([place]) 04/27/2010    Current Outpatient Medications:  .  acetaminophen (TYLENOL) 500 MG tablet, Take 1,000 mg every 8 (eight) hours as needed by mouth for mild pain, moderate pain, fever or headache., Disp: , Rfl:  .  ALPRAZolam (XANAX) 0.5 MG tablet, Take 0.5 mg by mouth daily as needed for anxiety., Disp: , Rfl:  .  aspirin 81 MG tablet, Take 81 mg by mouth daily. , Disp: , Rfl:  .  atorvastatin (LIPITOR) 20 MG tablet, Take 20 mg by mouth every other day. , Disp: , Rfl:  .  Cholecalciferol (VITAMIN D) 2000 units tablet, Take 2,000 Units daily by mouth., Disp: , Rfl:  .   clobetasol ointment (TEMOVATE) 6.14 %, Apply 1 application topically 2 (two) times daily., Disp: , Rfl:  .  Cranberry 400 MG TABS, Take 1,600 mg daily by mouth., Disp: , Rfl:  .  Cyanocobalamin (VITAMIN B-12) 2500 MCG SUBL, Place 1 tablet under the tongue daily., Disp: , Rfl:  .  doxycycline (VIBRA-TABS) 100 MG tablet, Take 100 mg by mouth 2 (two) times daily., Disp: , Rfl:  .  fluticasone furoate-vilanterol (BREO ELLIPTA) 200-25 MCG/INH AEPB, Inhale 1 puff into the lungs daily. (Patient taking differently: Inhale 1 puff into the lungs daily as needed (SOB). ), Disp: 1 each, Rfl: 3 .  guaiFENesin (MUCINEX) 600 MG 12 hr tablet, Take 1,200 mg by mouth 2 (two) times daily as needed for cough. Started 10/30 , Disp: , Rfl:  .  insulin NPH-regular Human (NOVOLIN 70/30) (70-30) 100 UNIT/ML injection, Inject 20 Units into the skin every morning. , Disp: , Rfl:  .  levothyroxine (SYNTHROID, LEVOTHROID) 50 MCG tablet, Take 50 mcg by mouth daily.  , Disp: , Rfl:  .  lidocaine (XYLOCAINE) 5 % ointment, Apply 1 application 2 (two) times daily topically. , Disp: , Rfl:  .  lipase/protease/amylase (CREON) 36000 UNITS CPEP capsule, Take 36,000-72,000 Units by mouth See admin instructions. Take 2 capsules with meals and 1 with snacks, Disp: , Rfl:  .  Multiple Vitamin (MULTIVITAMIN WITH MINERALS) TABS tablet, Take 0.5 tablets by mouth daily., Disp: , Rfl:  .  mupirocin ointment (BACTROBAN) 2 %, Place 1 application into the nose 3 (three) times daily., Disp: , Rfl:  .  niacinamide 500  MG tablet, Take 500 mg by mouth 2 (two) times daily with a meal., Disp: , Rfl:  .  Polyethyl Glycol-Propyl Glycol (SYSTANE OP), Apply 1-2 drops to eye 2 (two) times daily as needed (dryness)., Disp: , Rfl:  .  polyethylene glycol (MIRALAX / GLYCOLAX) packet, Take 17 g by mouth daily as needed., Disp: 14 each, Rfl: 0 .  potassium chloride SA (K-DUR,KLOR-CON) 20 MEQ tablet, Take 20 mEq by mouth 2 (two) times daily. , Disp: , Rfl:  .   PROAIR HFA 108 (90 Base) MCG/ACT inhaler, INHALE TWO PUFFS BY MOUTH EVERY 4 HOURS AS NEEDED FOR WHEEZING OR FOR SHORTNESS OF BREATH, Disp: 1 Inhaler, Rfl: 2 .  sertraline (ZOLOFT) 50 MG tablet, Take 100 mg by mouth at bedtime. , Disp: , Rfl:  .  spironolactone (ALDACTONE) 25 MG tablet, Take 1 tablet (25 mg total) by mouth daily. Please call [phone] to schedule appointment for additional refills thanks., Disp: 15 tablet, Rfl: 0 .  torsemide (DEMADEX) 20 MG tablet, Take 4 tablets (80 mg total) by mouth daily., Disp: 120 tablet, Rfl: 5 .  torsemide (DEMADEX) 20 MG tablet, Take 2 tablets (40 mg total) by mouth every evening., Disp: 60 tablet, Rfl: 5 Allergies  Allergen Reactions  . Celebrex [Celecoxib] Swelling  . Penicillins Rash    Has patient had a PCN reaction causing immediate rash, facial/tongue/throat swelling, SOB or lightheadedness with hypotension: Yes Has patient had a PCN reaction causing severe rash involving mucus membranes or skin necrosis: Yes Has patient had a PCN reaction that required hospitalization: No Has patient had a PCN reaction occurring within the last 10 years: Yes - Okay taking other cillin's If all of the above answers are "NO", then may proceed with Cephalosporin use.   . Cephalexin Hives  . Lasix [Furosemide] Hives  . Oxycodone-Acetaminophen Nausea Only  . Cephalosporins Rash  . Oxycodone Anxiety    Felt weird      Social History   Socioeconomic History  . Marital status: Married    Spouse name: Not on file  . Number of children: 4  . Years of education: Not on file  . Highest education level: Not on file  Occupational History  . 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 Financial resource strain: Patient refused  . Food insecurity:    Worry: Patient refused    Inability: Patient refused  . Transportation needs:    Medical: Patient refused    Non-medical: Patient refused  Tobacco Use  . Smoking  status: Never Smoker  . Smokeless tobacco: Never Used  Substance and Sexual Activity  . Alcohol use: No  . Drug use: No  . Sexual activity: Yes    Birth control/protection: IUD  Lifestyle  . Physical activity:    Days per week: Patient refused    Minutes per session: Patient refused  . Stress: Patient refused  Relationships  . Social connections:    Talks on phone: Patient refused    Gets together: Patient refused    Attends religious service: Patient refused    Active member of club or organization: Patient refused    Attends meetings of clubs or organizations: Patient refused    Relationship status: Patient refused  . Intimate partner violence:    Fear of current or ex partner: Patient refused    Emotionally abused: Patient refused    Physically abused: Patient refused    Forced sexual activity: Patient refused  Other Topics Concern  . Not on file  Social History Narrative  . Not on file    Physical Exam      Future Appointments  Date Time Provider [place]  12/05/2017  3:30 PM Luretha Rued, RN [facility] None    BP (!) 100/0 Comment: palpated  Pulse 90   Resp 15   Wt 288 lb (130.6 kg)   SpO2 98%   BMI 54.42 kg/m   Weight yesterday-? Last visit weight-293  *late entry due to computer froze up*  Pt finally called me back and requested a visit for today, I tried to contact the home health nurse to let her know as we were going to do a joint visit for tomor however but she didn't answer and her VM was full and unable to leave message.  Pt states she is feeling much better, swelling in her legs have decreased however still there. She is able to ambulate to use bathroom. She reports her wounds have been bleeding again. The ones on her legs are now open--the last time I seen her those wounds appeared to be dried over and more like a scab-so im not sure if she hit her leg or her skin caught it on something and pulled the scabs off or how it began to bleed  but if that's the case then it happened to all of them on her leg-she has 2-3 on side/back of her left thigh area. The larger one did start to ooze blood again, I covered with gauze and lightly taped it down. She denies any injuries or anyone messing with it while bathing. I advised her she needed to resch her clinic appoint, she states she cant go when its so hot outside so I told her we will try for early morning and she agreed. So I will speak with clinic to try to get that done for either a Monday or Wednesday. Her daughter was there when I arrived and stated she was very concerned for her mother and was very tearful, stating she has not ever seen her mother this bad and she needed help. I could not verify meds since computer was not working but pt did verbalize how she was taking meds.   [name], [place] [facility] Paramedic  12/05/17

## 2017-12-05 NOTE — Patient Outreach (Signed)
[facility]) Care Management  12/05/2017  Anavictoria Wilk November 29, 1940 [id number]  Referral received 11/18/17 from telephonic care coordinator.   77 year old with history of heart failure, diabeted, sleep apnea. RNCM called to schedule home visit. Client states that she has a lot of people coming to see her and calling her.   RNCM called to follow up. Client answered the phone. RNCM discussed the care management program client states, "I already have home health".   RNCM discussed the differences and client requests RNCM call Katie and schedule a visit with [name] for next week. Mrs. Staib also expressed that she has personal care service and she does not let anyone interfere with her personal care service time. She declines to answer any assessment questions as this time.  Plan: RNCM called and spoke with Katie. Co-visit scheduled for next week.  [name], RN, MSN, [place] Coordinator Cell: [phone]

## 2017-12-06 DIAGNOSIS — I272 Pulmonary hypertension, unspecified: Secondary | ICD-10-CM | POA: Diagnosis not present

## 2017-12-06 DIAGNOSIS — J449 Chronic obstructive pulmonary disease, unspecified: Secondary | ICD-10-CM | POA: Diagnosis not present

## 2017-12-06 DIAGNOSIS — E662 Morbid (severe) obesity with alveolar hypoventilation: Secondary | ICD-10-CM | POA: Diagnosis not present

## 2017-12-06 DIAGNOSIS — K7460 Unspecified cirrhosis of liver: Secondary | ICD-10-CM | POA: Diagnosis not present

## 2017-12-06 DIAGNOSIS — I872 Venous insufficiency (chronic) (peripheral): Secondary | ICD-10-CM | POA: Diagnosis not present

## 2017-12-06 DIAGNOSIS — I5033 Acute on chronic diastolic (congestive) heart failure: Secondary | ICD-10-CM | POA: Diagnosis not present

## 2017-12-06 DIAGNOSIS — E119 Type 2 diabetes mellitus without complications: Secondary | ICD-10-CM | POA: Diagnosis not present

## 2017-12-06 DIAGNOSIS — F329 Major depressive disorder, single episode, unspecified: Secondary | ICD-10-CM | POA: Diagnosis not present

## 2017-12-06 DIAGNOSIS — I110 Hypertensive heart disease with heart failure: Secondary | ICD-10-CM | POA: Diagnosis not present

## 2017-12-06 DIAGNOSIS — G4733 Obstructive sleep apnea (adult) (pediatric): Secondary | ICD-10-CM | POA: Diagnosis not present

## 2017-12-06 DIAGNOSIS — E039 Hypothyroidism, unspecified: Secondary | ICD-10-CM | POA: Diagnosis not present

## 2017-12-06 DIAGNOSIS — Z7951 Long term (current) use of inhaled steroids: Secondary | ICD-10-CM | POA: Diagnosis not present

## 2017-12-06 DIAGNOSIS — L120 Bullous pemphigoid: Secondary | ICD-10-CM | POA: Diagnosis not present

## 2017-12-10 ENCOUNTER — Telehealth (HOSPITAL_COMMUNITY): Payer: Self-pay | Admitting: *Deleted

## 2017-12-10 NOTE — Telephone Encounter (Signed)
Nina Howell is aware and will let patient know.  Nina Howell is also going to see patient in her home tomorrow to follow up on weight/symptoms and will let us know.  No further questions.

## 2017-12-10 NOTE — Telephone Encounter (Signed)
Yes, she can take 2.5 mg metolazone x1. Let us know if weight doesn't go down or if symptoms worsen. Thanks.

## 2017-12-10 NOTE — Telephone Encounter (Signed)
Patient called Nina Howell with paramedicine saying she's gained 5 lbs in a week.  No other symptoms reported other than swelling.  Patient has swelling at baseline.  Patient is asking if she can take metolazone.    Will forward to Lillia Mountain, NP to review and will call Nina Howell back with her response.

## 2017-12-11 ENCOUNTER — Other Ambulatory Visit: Payer: Self-pay

## 2017-12-11 ENCOUNTER — Other Ambulatory Visit (HOSPITAL_COMMUNITY): Payer: Self-pay

## 2017-12-11 NOTE — Addendum Note (Signed)
Addended by: Luretha Rued on: 12/11/2017 06:04 PM   Modules accepted: Orders

## 2017-12-11 NOTE — Patient Outreach (Addendum)
[facility]) Care Management   12/11/2017  Nikie Cid 04-Apr-1941 [id number]  Nina Howell is an 77 y.o. female  Subjective: client reports some bleeding noted at times to bullous areas.  Objective:  Vital signs per paramedic-palpated 90 systolic. Automatic blood pressure cuff  115/70 heart rate 86, respirations 15, Oxygen saturation 99%. Oxygen on per patient at 3l/[place].  Review of Systems  Respiratory:       Decreased breath sounds noted bilaterally  Cardiovascular: Positive for leg swelling.       S1S2 noted, regular  Skin:       History of bullous skin disorder: Open areas three noted to lateral thigh: #1 top area closes to hip-approximately 1" round #2 middle area approximately 1/2" round and #3 area closet to knee a little less than 1/2" round. No drainage noted at this time.    Physical Exam  Skin warm dry, color within normal lesion, respiration even/unlabored.  Encounter Medications:   Outpatient Encounter Medications as of 12/11/2017  Medication Sig Note  . acetaminophen (TYLENOL) 500 MG tablet Take 1,000 mg every 8 (eight) hours as needed by mouth for mild pain, moderate pain, fever or headache.   . ALPRAZolam (XANAX) 0.5 MG tablet Take 0.5 mg by mouth daily as needed for anxiety.   Marland Kitchen aspirin 81 MG tablet Take 81 mg by mouth daily.    Marland Kitchen atorvastatin (LIPITOR) 20 MG tablet Take 20 mg by mouth every other day.    . Cholecalciferol (VITAMIN D) 2000 units tablet Take 2,000 Units daily by mouth. 12/11/2017: Bottle reads 1000IU takes 3 tablets daily  . clobetasol ointment (TEMOVATE) 4.09 % Apply 1 application topically 2 (two) times daily.   . Cranberry 400 MG TABS Take 1,600 mg daily by mouth. 12/11/2017: Bottle reads 4200mg   . Cyanocobalamin (VITAMIN B-12) 2500 MCG SUBL Place 1 tablet under the tongue daily.   Marland Kitchen doxycycline (VIBRA-TABS) 100 MG tablet Take 100 mg by mouth 2 (two) times daily.   . fluticasone furoate-vilanterol (BREO ELLIPTA) 200-25  MCG/INH AEPB Inhale 1 puff into the lungs daily. (Patient taking differently: Inhale 1 puff into the lungs daily as needed (SOB). )   . insulin NPH-regular Human (NOVOLIN 70/30) (70-30) 100 UNIT/ML injection Inject 20 Units into the skin every morning.  12/11/2017: Taking 20 units in the morning and 10 units at night.  . levothyroxine (SYNTHROID, LEVOTHROID) 50 MCG tablet Take 50 mcg by mouth daily.     Marland Kitchen lidocaine (XYLOCAINE) 5 % ointment Apply 1 application 2 (two) times daily topically.    . lipase/protease/amylase (CREON) 36000 UNITS CPEP capsule Take 81,191-47,829 Units by mouth See admin instructions. Take 2 capsules with meals and 1 with snacks   . Multiple Vitamin (MULTIVITAMIN WITH MINERALS) TABS tablet Take 0.5 tablets by mouth daily.   . niacinamide 500 MG tablet Take 500 mg by mouth 2 (two) times daily with a meal.   . Polyethyl Glycol-Propyl Glycol (SYSTANE OP) Apply 1-2 drops to eye 2 (two) times daily as needed (dryness).   . potassium chloride SA (K-DUR,KLOR-CON) 20 MEQ tablet Take 20 mEq by mouth 2 (two) times daily.    Marland Kitchen PROAIR HFA 108 (90 Base) MCG/ACT inhaler INHALE TWO PUFFS BY MOUTH EVERY 4 HOURS AS NEEDED FOR WHEEZING OR FOR SHORTNESS OF BREATH   . sertraline (ZOLOFT) 50 MG tablet Take 100 mg by mouth at bedtime.    Marland Kitchen spironolactone (ALDACTONE) 25 MG tablet Take 1 tablet (25 mg total) by mouth daily. Please call  [phone] to schedule appointment for additional refills thanks.   . torsemide (DEMADEX) 20 MG tablet Take 4 tablets (80 mg total) by mouth daily. 12/11/2017: Currently taking 4 tablets twice a day  . guaiFENesin (MUCINEX) 600 MG 12 hr tablet Take 1,200 mg by mouth 2 (two) times daily as needed for cough. Started 10/30    . mupirocin ointment (BACTROBAN) 2 % Place 1 application into the nose 3 (three) times daily.   . polyethylene glycol (MIRALAX / GLYCOLAX) packet Take 17 g by mouth daily as needed. (Patient not taking: Reported on 12/11/2017)   . torsemide (DEMADEX)  20 MG tablet Take 2 tablets (40 mg total) by mouth every evening. 9/52/8413: duplicate   No facility-administered encounter medications on file as of 12/11/2017.     Functional Status:   In your present state of health, do you have any difficulty performing the following activities: 12/11/2017 11/11/2017  Hearing? N N  Vision? N N  Difficulty concentrating or making decisions? N N  Walking or climbing stairs? Y Y  Dressing or bathing? Y N  Doing errands, shopping? Y N  Preparing Food and eating ? Y -  Using the Toilet? N -  In the past six months, have you accidently leaked urine? N -  Do you have problems with loss of bowel control? N -  Managing your Medications? N -  Managing your Finances? Y -  Housekeeping or managing your Housekeeping? Y -  Some recent data might be hidden    Fall/Depression Screening:    Fall Risk  12/11/2017 02/20/2016 12/27/2014  Falls in the past year? No No No  Risk for fall due to : - Impaired mobility Impaired mobility   PHQ 2/9 Scores 12/11/2017 02/20/2016  PHQ - 2 Score 0 1    Assessment:  77 year old with history of Heart failure diabetes, sleep apnea, obesity.  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 Her husband is currently employed and is very supportive of client.  Client reports she was seen by advanced home care, but is unclear of when she is coming back. RNCM called advanced home care request the nurse call RNCM back. Client is active with [facility] for personal care service. She states she does not have Medicaid, but is in this program arranged by DSS.  History of heart failure. She is participating in tele-monitoring program with [facility] shield. She states her weight today was 289 pounds and yesterday she reports she was 293 pounds.  History of diabetes-she reports her blood sugar was 214 this morning. She states that Dr. Buddy Duty has called her and she is taking medication as prescribed by Dr. Buddy Duty.  Per client  her Appointment are scheduled in order of priority:  She states she will see Dr. Haroldine Laws on 12/16/17  Then she will schedule appointment to see Dr. Buddy Duty.  Care Coordination needs discussed today: Client is unable to get up or down the stairs per self safely. She calls fire department to get her up and down the stairs and travels by car with her husband to her appointments. She acknowledges that she has anxiety and trust is an issue regarding who is assisting her down the stairs.  1) She is not able to easily get to and from her appointments therefore she reports prioritizing which provider she is going to see.   2) She has issues with Bullous skin condition wound care needs. Follow up with home health, RNCM called to  discuss plan of care with home health nurse.   3) Client with questions regarding chair lift for her home, she reports she has looked into one company, but cost was too much. She would like to know about other possibilities. She also needs hand rails in bathroom. Social work referral.  4) Client reports she is on Creon and currently has samples. She is not sure of the cost of the medication, but would like assistance with cost. It sounds as if someone has started an application for assistance.-pharmacy referral.  Primary care - Dr. Shelbie Proctor Heart failure - Dr. Haroldine Laws Endocrinologist - Dr. Buddy Duty Dermatologist - Dr. Sydnee Levans.  Pulmonology - Dr. Elsworth Soho  Plan: follow up in two weeks. [facility] CM Care Plan Problem One     Most Recent Value  Care Plan Problem One  care coordination needs re: self-care management of multiple disease process.  Role Documenting the Problem One  Care Management Coordinator  Care Plan for Problem One  Active  [facility] Long Term Goal   .  [facility] CM Short Term Goal #1   lient will verbalize contact with referred disciplines within the next 30 days.  [facility] CM Short Term Goal #1 Start Date  12/11/17  Interventions for Short Term Goal #1  care coordination  call with home health, referral to [facility] social worker and pharmacist as well as paramedic.    [facility] CM Care Plan Problem Two     Most Recent Value  Care Plan Problem Two  heart failure management  Role Documenting the Problem Two  Care Management Coordinator  Care Plan for Problem Two  Active  Interventions for Problem Two Long Term Goal   co-visit with paramedic, reviewed medications, discussed client's plan to getting to cardiology appointment next week.  [facility] Long Term Goal  client will verbalize action plan within the next 45 days.  [facility] Long Term Goal Start Date  12/11/17  [facility] CM Short Term Goal #1   client will attend scheduled appointment with cardiologist within the next 30 days.  [facility] CM Short Term Goal #1 Start Date  12/11/17  Interventions for Short Term Goal #2   co-visit with paramedic, reviewed client's plan for getting to office visit.  [facility] CM Short Term Goal #2   client will verbalize zones and appropriate actions within the next 30 days.  [facility] CM Short Term Goal #2 Start Date  12/11/17  Interventions for Short Term Goal #2  provided [facility] calender/organizer and explained how to use.     [name], RN, MSN, [place] Coordinator Cell: [phone]  Addendum: client travels to appointment with her husband by car, but calls the fire department to assist her with getting up and down the stairs with a special stair chair.

## 2017-12-11 NOTE — Progress Notes (Signed)
Paramedicine Encounter    Patient ID: Nina Howell, female    DOB: 05-14-1941, 77 y.o.   MRN: [id number]   Patient Care Team: Leeroy Cha, MD as PCP - General (Internal Medicine) Larey Dresser, MD as PCP - Cardiology (Cardiology) Delrae Rend, MD as Consulting Physician (Internal Medicine) Luretha Rued, RN as [place] Management  Patient Active Problem List   Diagnosis Date Noted  . Pressure injury of skin 11/11/2017  . Chronic respiratory failure with hypoxia ([place]) 04/03/2017  . Pericardial effusion 11/23/2015  . Chronic diastolic heart failure ([place]) 11/23/2015  . Pulmonary hypertension ([place]) 09/06/2015  . Acute on chronic congestive heart failure ([place])   . Rash and nonspecific skin eruption 11/27/2014  . Post-menopausal bleeding 06/18/2011  . Varicose veins 04/25/2011  . Unspecified venous (peripheral) insufficiency 04/25/2011  . Hemangioma of liver, left side 03/23/2011  . HYPOTHYROIDISM 04/27/2010  . Type II or unspecified type diabetes mellitus without mention of complication, not stated as uncontrolled 04/27/2010  . HYPOKALEMIA 04/27/2010  . Morbid obesity ([place]) 04/27/2010  . DEPRESSION 04/27/2010  . Obstructive sleep apnea 04/27/2010  . Essential hypertension 04/27/2010  . Congestive heart failure ([place]) 04/27/2010  . Obesity hypoventilation syndrome ([place]) 04/27/2010    Current Outpatient Medications:  .  acetaminophen (TYLENOL) 500 MG tablet, Take 1,000 mg every 8 (eight) hours as needed by mouth for mild pain, moderate pain, fever or headache., Disp: , Rfl:  .  ALPRAZolam (XANAX) 0.5 MG tablet, Take 0.5 mg by mouth daily as needed for anxiety., Disp: , Rfl:  .  aspirin 81 MG tablet, Take 81 mg by mouth daily. , Disp: , Rfl:  .  atorvastatin (LIPITOR) 20 MG tablet, Take 20 mg by mouth every other day. , Disp: , Rfl:  .  Cholecalciferol (VITAMIN D) 2000 units tablet, Take 2,000 Units daily by mouth., Disp: , Rfl:  .   clobetasol ointment (TEMOVATE) 7.20 %, Apply 1 application topically 2 (two) times daily., Disp: , Rfl:  .  Cranberry 400 MG TABS, Take 1,600 mg daily by mouth., Disp: , Rfl:  .  Cyanocobalamin (VITAMIN B-12) 2500 MCG SUBL, Place 1 tablet under the tongue daily., Disp: , Rfl:  .  doxycycline (VIBRA-TABS) 100 MG tablet, Take 100 mg by mouth 2 (two) times daily., Disp: , Rfl:  .  fluticasone furoate-vilanterol (BREO ELLIPTA) 200-25 MCG/INH AEPB, Inhale 1 puff into the lungs daily. (Patient taking differently: Inhale 1 puff into the lungs daily as needed (SOB). ), Disp: 1 each, Rfl: 3 .  guaiFENesin (MUCINEX) 600 MG 12 hr tablet, Take 1,200 mg by mouth 2 (two) times daily as needed for cough. Started 10/30 , Disp: , Rfl:  .  insulin NPH-regular Human (NOVOLIN 70/30) (70-30) 100 UNIT/ML injection, Inject 20 Units into the skin every morning. , Disp: , Rfl:  .  levothyroxine (SYNTHROID, LEVOTHROID) 50 MCG tablet, Take 50 mcg by mouth daily.  , Disp: , Rfl:  .  lidocaine (XYLOCAINE) 5 % ointment, Apply 1 application 2 (two) times daily topically. , Disp: , Rfl:  .  lipase/protease/amylase (CREON) 36000 UNITS CPEP capsule, Take 36,000-72,000 Units by mouth See admin instructions. Take 2 capsules with meals and 1 with snacks, Disp: , Rfl:  .  Multiple Vitamin (MULTIVITAMIN WITH MINERALS) TABS tablet, Take 0.5 tablets by mouth daily., Disp: , Rfl:  .  mupirocin ointment (BACTROBAN) 2 %, Place 1 application into the nose 3 (three) times daily., Disp: , Rfl:  .  niacinamide 500  MG tablet, Take 500 mg by mouth 2 (two) times daily with a meal., Disp: , Rfl:  .  Polyethyl Glycol-Propyl Glycol (SYSTANE OP), Apply 1-2 drops to eye 2 (two) times daily as needed (dryness)., Disp: , Rfl:  .  polyethylene glycol (MIRALAX / GLYCOLAX) packet, Take 17 g by mouth daily as needed. (Patient not taking: Reported on 12/11/2017), Disp: 14 each, Rfl: 0 .  potassium chloride SA (K-DUR,KLOR-CON) 20 MEQ tablet, Take 20 mEq by mouth 2  (two) times daily. , Disp: , Rfl:  .  PROAIR HFA 108 (90 Base) MCG/ACT inhaler, INHALE TWO PUFFS BY MOUTH EVERY 4 HOURS AS NEEDED FOR WHEEZING OR FOR SHORTNESS OF BREATH, Disp: 1 Inhaler, Rfl: 2 .  sertraline (ZOLOFT) 50 MG tablet, Take 100 mg by mouth at bedtime. , Disp: , Rfl:  .  spironolactone (ALDACTONE) 25 MG tablet, Take 1 tablet (25 mg total) by mouth daily. Please call [phone] to schedule appointment for additional refills thanks., Disp: 15 tablet, Rfl: 0 .  torsemide (DEMADEX) 20 MG tablet, Take 4 tablets (80 mg total) by mouth daily., Disp: 120 tablet, Rfl: 5 .  torsemide (DEMADEX) 20 MG tablet, Take 2 tablets (40 mg total) by mouth every evening., Disp: 60 tablet, Rfl: 5 Allergies  Allergen Reactions  . Celebrex [Celecoxib] Swelling  . Penicillins Rash    Has patient had a PCN reaction causing immediate rash, facial/tongue/throat swelling, SOB or lightheadedness with hypotension: Yes Has patient had a PCN reaction causing severe rash involving mucus membranes or skin necrosis: Yes Has patient had a PCN reaction that required hospitalization: No Has patient had a PCN reaction occurring within the last 10 years: Yes - Okay taking other cillin's If all of the above answers are "NO", then may proceed with Cephalosporin use.   . Cephalexin Hives  . Lasix [Furosemide] Hives  . Oxycodone-Acetaminophen Nausea Only  . Cephalosporins Rash  . Oxycodone Anxiety    Felt weird      Social History   Socioeconomic History  . Marital status: Married    Spouse name: Not on file  . Number of children: 4  . Years of education: Not on file  . Highest education level: Not on file  Occupational History  . 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 Financial resource strain: Patient refused  . Food insecurity:    Worry: Patient refused    Inability: Patient refused  . Transportation needs:    Medical: Patient refused    Non-medical:  Patient refused  Tobacco Use  . Smoking status: Never Smoker  . Smokeless tobacco: Never Used  Substance and Sexual Activity  . Alcohol use: No  . Drug use: No  . Sexual activity: Yes    Birth control/protection: IUD  Lifestyle  . Physical activity:    Days per week: Patient refused    Minutes per session: Patient refused  . Stress: Patient refused  Relationships  . Social connections:    Talks on phone: Patient refused    Gets together: Patient refused    Attends religious service: Patient refused    Active member of club or organization: Patient refused    Attends meetings of clubs or organizations: Patient refused    Relationship status: Patient refused  . Intimate partner violence:    Fear of current or ex partner: Patient refused    Emotionally abused: Patient refused    Physically abused: Patient refused  Forced sexual activity: Patient refused  Other Topics Concern  . Not on file  Social History Narrative  . Not on file    Physical Exam      Future Appointments  Date Time Provider [place]  12/16/2017  8:30 AM [facility] PA/NP [facility] None    BP (!) 90/0 Comment: palpated  Pulse 86   Resp 15   Wt 289 lb (131.1 kg)   SpO2 99%   BMI 54.61 kg/m   Weight yesterday-293 Last visit weight-293 on  7/2 CBG PTA-214 Home machine--115/70  Joint visit with [facility] today for juanas first home visit.  Pt called me yesterday and reported that her bc/bs telemonitoring people called her with a weight gain of 5lbs from last week. She was instructed to take metolazone yesterday. Her weight is down from yesterday. She is taking her torsemide 80mg  BID.  [facility] home [facility] nurse has not been back out. She needs wound care nursing. I advised that last time I spoke with the nurse care manager a few wks ago and she advised she would address it.  She is just now fixing to eat breakfast and is going to take her meds while she eats.  She had a 32oz bottle of water  filled with ice too and also some juice with a lot of ice also. I advised her of her fluid intake limit for the day and cautioned her what it can do-make her have swelling, increased sob, weight gain.  She replied its hard taking her pills so she needs a lot of fluid to get it down. Pt fills her own pill box.  Poor appetite. She isnt quite following low sugar diet--she drinks juice. Pudding. Tea.  [name], [facility] Paramedic  12/11/17

## 2017-12-12 ENCOUNTER — Other Ambulatory Visit: Payer: Self-pay | Admitting: Licensed Clinical Social Worker

## 2017-12-12 ENCOUNTER — Other Ambulatory Visit: Payer: Self-pay

## 2017-12-12 NOTE — Patient Outreach (Addendum)
[place] [facility]) Care Management  12/12/2017  Nina Howell 05/25/1940 [id number]  77 year old female referred to [place] Management.  [place] services requested for medication assistance for Creon.  PMHx includes, but not limited to, hypertension, heart failure, Type 2 diabetes mellitus and  hypothyroidism.  Successful outreach attempt to [name],  HIPAA identifiers verified. She requested that I speak to her daughter, [name].   Left HIPAA compliant voice message with [name] requesting a return call.   Plan: Outreach attempt in 3-4 business days.  Joetta Manners, PharmD Clinical Pharmacist [place] [phone]  Addendum: Incoming call received from [name].  HIPAA identifiers verified.   [name] states that she has completed the Creon patient assistance application and returned it along with all necessary documents to the company.   She states that she is in contact with their representative [place].  She states that she does not need [place] assistance in this matter.  She reports her mom only has about 1 week of Creon samples left.  Informed her that patient assistance approval is not a quick process and that she should contact the MD for more samples.  She states she will call the doctor today.  Plan: Will close [place] case.    Inform [facility] care team of case closure.   Joetta Manners, PharmD Clinical Pharmacist [place] [phone]

## 2017-12-12 NOTE — Patient Outreach (Signed)
[place] [facility]) Care Management  12/12/2017  Nina Howell 02/04/1941 [id number]   [facility] CSW received new referral for community resource assistance. Referral states that patient is in need of a chair lift and has looked into one company, but cost was too much. She would like to know about other possibilities. She also needs hand rails in bathroom. Transportation for her is fire-department to help her get down the stairs and non emergency ambulance to take her to her appointments and then they take her back up the stairs. She has several providers that she needs to see, ut she is prioritizing them because it takes so much to get her to her appointments. [facility] CSW completed initial outreach call to patient and was able to reach her successfully. HIPPA verifications received. [facility] CSW introduced self, reason for call and of [facility] social work services. Patient reports that she has resolved all of her social work issues besides gaining hand rails in the bathroom. Patient reports that she contacted the EMS Paramedic Supervisor and spoke with him about her transportation situation. Patient reports that she is very picky about those who assist her with getting out of the house and into a car for transport and ONLY wants to use their service. Patient denies wanting any other transportation resource information. 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 Patient reports that she has no problem being able to find resources on her own as that was a huge part of her profession for decades. Patient reports that she no longer is interested in gaining resources for a chair lift as she and her spouse have decided against getting one due to the financial cost. Patient reports that she is struggling providing the same information to different care team providers and does not have the energy to  repeat things. [facility] CSW expressed understanding. Patient reports not wanting any social work assistance with gaining a chair lift at this time or with transportation now that she has spoken with with paramedic supervisor. Patient is agreeable to [facility] CSW completing a referral to [employer] in order for her to gain hand rails in the bathroom. Patient reports that she has a high tub and this would greatly benefit her and her safety. [facility] CSW completed call to [facility] to complete referral but was unsuccessful in reaching her. Voice message left.  [facility] CM Care Plan Problem One     Most Recent Value  Care Plan Problem One  Need for additional community resource support and knowledge   Role Documenting the Problem One  Clinical Social Worker  Care Plan for Problem One  Active  [facility] CM Short Term Goal #1   Patient will successfully be referred to [employer] in order to gain hand rails in bathroom within 30 days  [facility] CM Short Term Goal #1 Start Date  12/12/17  Interventions for Short Term Goal #1  [facility] CSW provided education on program to patient. [facility] CSW was unable to reach [name] with [facility] today but leftt a voice message encouraging a return call in order to place referral.     [facility] CSW will route encounter to PCP at this time and re-attempt to complete referral to [employer] within one week.  Eula Fried, BSW, MSW, [email or web address] Phone: [phone] Fax: [phone]

## 2017-12-14 ENCOUNTER — Telehealth: Payer: Self-pay | Admitting: Physician Assistant

## 2017-12-14 NOTE — Telephone Encounter (Signed)
Pt called stating she has had a 9 lbs weight gain over night. She takes 80 mg torsemide BID at baseline. I advised her to take an extra 20 mg torsemide and a 2.5 mg metolazone this morning and to take a total of 100 mg torsemide tonight. She denies SOB. I asked her to call me back tomorrow and let me know her weight. We also discussed when to come to the ER. She expressed understanding of the plan.

## 2017-12-16 ENCOUNTER — Other Ambulatory Visit (HOSPITAL_COMMUNITY): Payer: Self-pay

## 2017-12-16 ENCOUNTER — Telehealth (HOSPITAL_COMMUNITY): Payer: Self-pay | Admitting: *Deleted

## 2017-12-16 ENCOUNTER — Ambulatory Visit: Payer: Self-pay

## 2017-12-16 ENCOUNTER — Ambulatory Visit (HOSPITAL_COMMUNITY)
Admission: RE | Admit: 2017-12-16 | Discharge: 2017-12-16 | Disposition: A | Discharge status: Home / Self Care | Payer: BLUE CROSS/BLUE SHIELD | Source: Ambulatory Visit | Attending: Cardiology | Admitting: Cardiology

## 2017-12-16 VITALS — BP 106/0 | HR 94 | Wt 281.0 lb

## 2017-12-16 DIAGNOSIS — Z79899 Other long term (current) drug therapy: Secondary | ICD-10-CM | POA: Insufficient documentation

## 2017-12-16 DIAGNOSIS — I110 Hypertensive heart disease with heart failure: Secondary | ICD-10-CM | POA: Insufficient documentation

## 2017-12-16 DIAGNOSIS — Z6841 Body Mass Index (BMI) 40.0 and over, adult: Secondary | ICD-10-CM | POA: Diagnosis not present

## 2017-12-16 DIAGNOSIS — K761 Chronic passive congestion of liver: Secondary | ICD-10-CM | POA: Insufficient documentation

## 2017-12-16 DIAGNOSIS — Z885 Allergy status to narcotic agent status: Secondary | ICD-10-CM | POA: Diagnosis not present

## 2017-12-16 DIAGNOSIS — I5033 Acute on chronic diastolic (congestive) heart failure: Secondary | ICD-10-CM | POA: Diagnosis not present

## 2017-12-16 DIAGNOSIS — J449 Chronic obstructive pulmonary disease, unspecified: Secondary | ICD-10-CM | POA: Diagnosis not present

## 2017-12-16 DIAGNOSIS — I313 Pericardial effusion (noninflammatory): Secondary | ICD-10-CM | POA: Diagnosis not present

## 2017-12-16 DIAGNOSIS — G4733 Obstructive sleep apnea (adult) (pediatric): Secondary | ICD-10-CM | POA: Diagnosis not present

## 2017-12-16 DIAGNOSIS — Z975 Presence of (intrauterine) contraceptive device: Secondary | ICD-10-CM | POA: Insufficient documentation

## 2017-12-16 DIAGNOSIS — Z7951 Long term (current) use of inhaled steroids: Secondary | ICD-10-CM | POA: Diagnosis not present

## 2017-12-16 DIAGNOSIS — Z794 Long term (current) use of insulin: Secondary | ICD-10-CM | POA: Insufficient documentation

## 2017-12-16 DIAGNOSIS — I5032 Chronic diastolic (congestive) heart failure: Secondary | ICD-10-CM | POA: Diagnosis not present

## 2017-12-16 DIAGNOSIS — Z9981 Dependence on supplemental oxygen: Secondary | ICD-10-CM | POA: Diagnosis not present

## 2017-12-16 DIAGNOSIS — G5600 Carpal tunnel syndrome, unspecified upper limb: Secondary | ICD-10-CM | POA: Diagnosis not present

## 2017-12-16 DIAGNOSIS — Z886 Allergy status to analgesic agent status: Secondary | ICD-10-CM | POA: Insufficient documentation

## 2017-12-16 DIAGNOSIS — Z881 Allergy status to other antibiotic agents status: Secondary | ICD-10-CM | POA: Insufficient documentation

## 2017-12-16 DIAGNOSIS — I272 Pulmonary hypertension, unspecified: Secondary | ICD-10-CM

## 2017-12-16 DIAGNOSIS — Z888 Allergy status to other drugs, medicaments and biological substances status: Secondary | ICD-10-CM | POA: Insufficient documentation

## 2017-12-16 DIAGNOSIS — R9431 Abnormal electrocardiogram [ECG] [EKG]: Secondary | ICD-10-CM | POA: Diagnosis not present

## 2017-12-16 DIAGNOSIS — Z79890 Hormone replacement therapy (postmenopausal): Secondary | ICD-10-CM | POA: Insufficient documentation

## 2017-12-16 DIAGNOSIS — E119 Type 2 diabetes mellitus without complications: Secondary | ICD-10-CM | POA: Insufficient documentation

## 2017-12-16 DIAGNOSIS — E039 Hypothyroidism, unspecified: Secondary | ICD-10-CM | POA: Diagnosis not present

## 2017-12-16 DIAGNOSIS — E662 Morbid (severe) obesity with alveolar hypoventilation: Secondary | ICD-10-CM

## 2017-12-16 DIAGNOSIS — I872 Venous insufficiency (chronic) (peripheral): Secondary | ICD-10-CM | POA: Diagnosis not present

## 2017-12-16 DIAGNOSIS — E785 Hyperlipidemia, unspecified: Secondary | ICD-10-CM | POA: Insufficient documentation

## 2017-12-16 DIAGNOSIS — L120 Bullous pemphigoid: Secondary | ICD-10-CM | POA: Diagnosis not present

## 2017-12-16 DIAGNOSIS — Z7982 Long term (current) use of aspirin: Secondary | ICD-10-CM | POA: Insufficient documentation

## 2017-12-16 DIAGNOSIS — F329 Major depressive disorder, single episode, unspecified: Secondary | ICD-10-CM | POA: Diagnosis not present

## 2017-12-16 DIAGNOSIS — Z880 Allergy status to penicillin: Secondary | ICD-10-CM | POA: Insufficient documentation

## 2017-12-16 DIAGNOSIS — K7460 Unspecified cirrhosis of liver: Secondary | ICD-10-CM | POA: Diagnosis not present

## 2017-12-16 LAB — BRAIN NATRIURETIC PEPTIDE: B Natriuretic Peptide: 1895.1 pg/mL — ABNORMAL HIGH (ref 0.0–100.0)

## 2017-12-16 LAB — BASIC METABOLIC PANEL
ANION GAP: 12 (ref 5–15)
BUN: 30 mg/dL — AB (ref 8–23)
CALCIUM: 9 mg/dL (ref 8.9–10.3)
CO2: 34 mmol/L — AB (ref 22–32)
Chloride: 89 mmol/L — ABNORMAL LOW (ref 98–111)
Creatinine, Ser: 1.11 mg/dL — ABNORMAL HIGH (ref 0.44–1.00)
GFR calc Af Amer: 55 mL/min — ABNORMAL LOW (ref >60)
GFR calc non Af Amer: 47 mL/min — ABNORMAL LOW (ref >60)
GLUCOSE: 303 mg/dL — AB (ref 70–99)
Potassium: 2.6 mmol/L — CL (ref 3.5–5.1)
Sodium: 135 mmol/L (ref 135–145)

## 2017-12-16 MED ORDER — TORSEMIDE 100 MG PO TABS: 100.0000 mg | ORAL_TABLET | Freq: Two times a day (BID) | ORAL | 11 refills | Status: DC

## 2017-12-16 MED ORDER — POTASSIUM CHLORIDE CRYS ER 20 MEQ PO TBCR: 40.0000 meq | EXTENDED_RELEASE_TABLET | Freq: Two times a day (BID) | ORAL | 11 refills | Status: AC

## 2017-12-16 NOTE — Addendum Note (Signed)
Encounter addended by: Conrad Bucklin, NP on: 12/16/2017 1:32 PM  Actions taken: Sign clinical note

## 2017-12-16 NOTE — Telephone Encounter (Signed)
Lab called with critical lab Potassium 2.6. Lab results given to [facility].

## 2017-12-16 NOTE — [place] Notes (Addendum)
Patient ID: Nina Howell, female   DOB: July 28, 1940, 77 y.o.   MRN: [id number]    Advanced Heart Failure Clinic Note   Primary Care: Dr. Baird Cancer Pulmonologist: Dr. Elsworth Soho Primary Cardiologist: Dr. Haroldine Laws Endocrinology: Dr Buddy Duty  HPI: Nina Howell is a 77 y.o. female with history of diastolic CHF, OSA, DM, morbid obesity, and COPD/asthma   Sees Dr Elsworth Soho for morbid obesity, OHS and OSA on CPAP has been encouraged to lose weight. Previously seen by Dr [name]. Echo 2011 done for dyspnea showed estimated PA pressure 56 mmHg and EF 45-50%. Underwent cath for abnormal ECG and nuclear study. Normal coronaries 2011. Follows with Dr. Johnsie Cancel now.   Admitted in 4/17 with respiratory failure and volume overload.. Overall diuresed 30 pounds. On ECHO RV severely dilated and LV EF 50-55%. With RVSP 69. Moderate pericardial effusion  VQ 08/25/15: Limited study due to body habitus. Possible 2 perfusion defects -> recommend chest CT CT 08/26/15: -> No PE Moderate to large pericardial effusion  Admitted 6/23 through 11/13/17 with volume overload. Diuresed with bumex and transitioned to torsemide 80 mg twice a day. Discharge weight was 296 pounds.   Today she returns for post hosp follow up. She has had couple doses of metolazone over the last week. Weight at home 279-281 pounds.  Complaining of leg edema and fatigue. Denies PND. SOB with exertion but this is her baseline. Remains on oxygen. Tries to follow low salt diet. Drinking extra fluid and eating ice at night. Taking all medications. [name] following. Paramedicine following.   Echo 10/2016:Marland Kitchen Very poor images  LVEF 55-60% RV dilated. Moderate HK. No TR to measure RVSP  No effusion   Review of systems complete and found to be negative unless listed in HPI.    Past Medical History:  Diagnosis Date  . Acute pancreatitis   . Anemia   . Asymptomatic cholelithiasis   . Bullous pemphigoid   . Cellulitis    ABDOMINAL WALL  . CHF (congestive heart  failure) (HCC)    DECOMPENSATED SYSTOLIC CHF  . CHF (congestive heart failure) ([place])   . CTS (carpal tunnel syndrome)   . Diabetes mellitus, type 2 ([place])   . DJD (degenerative joint disease)   . Hepatic lesion   . Hyperlipidemia   . 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 Hypertension   . Hypokalemia   . Hypothyroidism   . Hypoventilation   . IUD (intrauterine device) in place   . Liver cirrhosis ([place])   . Morbid obesity ([place])   . 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 Sleep apnea    cpap  . Varicose veins     Current Outpatient Medications  Medication Sig Dispense Refill  . insulin NPH-regular Human (NOVOLIN 70/30) (70-30) 100 UNIT/ML injection Inject into the skin. 20 units in the AM 10 units in the PM    . levothyroxine (SYNTHROID, LEVOTHROID) 50 MCG tablet Take 50 mcg by mouth daily.      Marland Kitchen lidocaine (XYLOCAINE) 5 % ointment Apply 1 application 2 (two) times daily topically.     . lipase/protease/amylase (CREON) 36000 UNITS CPEP capsule Take 71,696-78,938 Units by mouth See admin instructions. Take 2 capsules with meals and 1 with snacks    . Multiple Vitamin (MULTIVITAMIN WITH MINERALS) TABS tablet Take 0.5 tablets by mouth daily.    . mupirocin ointment (BACTROBAN) 2 % Place 1 application into the nose 3 (three) times  daily.    . niacinamide 500 MG tablet Take 500 mg by mouth 2 (two) times daily with a meal.    . Polyethyl Glycol-Propyl Glycol (SYSTANE OP) Apply 1-2 drops to eye 2 (two) times daily as needed (dryness).    . potassium chloride SA (K-DUR,KLOR-CON) 20 MEQ tablet Take 20 mEq by mouth 2 (two) times daily.     Marland Kitchen PROAIR HFA 108 (90 Base) MCG/ACT inhaler INHALE TWO PUFFS BY MOUTH EVERY 4 HOURS AS NEEDED FOR WHEEZING OR FOR SHORTNESS OF BREATH 1 Inhaler 2  . sertraline (ZOLOFT) 50 MG tablet Take 100 mg by mouth at bedtime.     Marland Kitchen spironolactone (ALDACTONE) 25 MG tablet Take 1 tablet  (25 mg total) by mouth daily. Please call [phone] to schedule appointment for additional refills thanks. 15 tablet 0  . torsemide (DEMADEX) 20 MG tablet Take 80 mg by mouth 2 (two) times daily.    Marland Kitchen acetaminophen (TYLENOL) 500 MG tablet Take 1,000 mg every 8 (eight) hours as needed by mouth for mild pain, moderate pain, fever or headache.    . ALPRAZolam (XANAX) 0.5 MG tablet Take 0.5 mg by mouth daily as needed for anxiety.    Marland Kitchen aspirin 81 MG tablet Take 81 mg by mouth daily.     Marland Kitchen atorvastatin (LIPITOR) 20 MG tablet Take 20 mg by mouth every other day.     . Cholecalciferol (VITAMIN D) 2000 units tablet Take 2,000 Units daily by mouth.    . clobetasol ointment (TEMOVATE) 7.26 % Apply 1 application topically 2 (two) times daily.    . Cranberry 400 MG TABS Take 1,600 mg daily by mouth.    . Cyanocobalamin (VITAMIN B-12) 2500 MCG SUBL Place 1 tablet under the tongue daily.    Marland Kitchen doxycycline (VIBRA-TABS) 100 MG tablet Take 100 mg by mouth 2 (two) times daily.    . fluticasone furoate-vilanterol (BREO ELLIPTA) 200-25 MCG/INH AEPB Inhale 1 puff into the lungs daily. (Patient taking differently: Inhale 1 puff into the lungs daily as needed (SOB). ) 1 each 3  . guaiFENesin (MUCINEX) 600 MG 12 hr tablet Take 1,200 mg by mouth 2 (two) times daily as needed for cough. Started 10/30     . polyethylene glycol (MIRALAX / GLYCOLAX) packet Take 17 g by mouth daily as needed. (Patient not taking: Reported on 12/11/2017) 14 each 0   No current facility-administered medications for this encounter.     Allergies  Allergen Reactions  . Celebrex [Celecoxib] Swelling  . Penicillins Rash    Has patient had a PCN reaction causing immediate rash, facial/tongue/throat swelling, SOB or lightheadedness with hypotension: Yes Has patient had a PCN reaction causing severe rash involving mucus membranes or skin necrosis: Yes Has patient had a PCN reaction that required hospitalization: No Has patient had a PCN reaction  occurring within the last 10 years: Yes - Okay taking other cillin's If all of the above answers are "NO", then may proceed with Cephalosporin use.   . Cephalexin Hives  . Lasix [Furosemide] Hives  . Oxycodone-Acetaminophen Nausea Only  . Cephalosporins Rash  . Oxycodone Anxiety    Felt weird      Social History   Socioeconomic History  . Marital status: Married    Spouse name: Not on file  . Number of children: 4  . Years of education: Not on file  . Highest education level: Not on file  Occupational History  . Occupation: retired    Fish farm manager: RETIRED  Comment: Education officer, [employer]  Social Needs  . Financial resource strain: Patient refused  . Food insecurity:    Worry: Patient refused    Inability: Patient refused  . Transportation needs:    Medical: Patient refused    Non-medical: Patient refused  Tobacco Use  . Smoking status: Never Smoker  . Smokeless tobacco: Never Used  Substance and Sexual Activity  . Alcohol use: No  . Drug use: No  . Sexual activity: Yes    Birth control/protection: IUD  Lifestyle  . Physical activity:    Days per week: Patient refused    Minutes per session: Patient refused  . Stress: Patient refused  Relationships  . Social connections:    Talks on phone: Patient refused    Gets together: Patient refused    Attends religious service: Patient refused    Active member of club or organization: Patient refused    Attends meetings of clubs or organizations: Patient refused    Relationship status: Patient refused  . Intimate partner violence:    Fear of current or ex partner: Patient refused    Emotionally abused: Patient refused    Physically abused: Patient refused    Forced sexual activity: Patient refused  Other Topics Concern  . Not on file  Social History Narrative  . Not on file      Family History  Problem Relation Age of Onset  . Cancer Mother        throat  . Diabetes Father   . Leukemia Father     Vitals:    12/16/17 0908  BP: (!) 106/0  Pulse: 94  SpO2: 100%     Wt Readings from Last 3 Encounters:  12/16/17 281 lb (127.5 kg)  12/11/17 289 lb (131.1 kg)  12/05/17 288 lb (130.6 kg)   Weight today was performed at home provided to [place]. She was unable to stand.   PHYSICAL EXAM:  General:  Appears chronically ill. Arrived in a wheel chair.  No resp difficulty.  HEENT: normal Neck: supple. JVP difficult to assess due to body habitus. Carotids 2+ bilat; no bruits. No lymphadenopathy or thryomegaly appreciated. Cor: PMI nondisplaced. Regular rate & rhythm. No rubs, gallops or murmurs. Lungs: clear on 2 liters oxygen.  Abdomen: obese, soft, nontender, nondistended. No hepatosplenomegaly. No bruits or masses. Good bowel sounds. Extremities: no cyanosis, clubbing, rash, LLE 1+ edema with partial thickness on the lateral aspect of her thighs.  Neuro: alert & orientedx3, cranial nerves grossly intact. moves all 4 extremities w/o difficulty. Affect pleasant  ASSESSMENT & PLAN:  1. Chronic diastolic HF - Echo 09/24/79 LVEF 50-55%, Grade 1 DD, RV severely dilated and severely. Echo improved in 7/17 EF ~45% with septal bounce. RV dilated with mild to moderate dysfunction. PA pressures < 40. Effsuion resolved. Echo 10/2016: EF 55-60% - NYHA III-IIIb. Volume status elevated. Increase torsemide 100 mg twice a day.    -Check BMET  - Continue spiro 25 mg daily - Continue HF Paramedicine.   2. 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  - PA pressures much improved on previous echo after diuresis. Unable to measure RVSP today on echo.  - We discussed R +/- LHC to clearly define pressures. Wishes to defer cath for now.  - VQ scan was indeterminate but Chest CT not suggestive of chronic PE, LE dopplers negative.  - ANA 1:80 Positive, RF, P-ANCA negative,  ESR 35. Likely not  clinically significant.  - Follows with Dr. Elsworth Soho. No change. -  Back on 2 L O2 at all times 3. Pericardial effusion, moderate to large - Resolved with diuresis. No change.  4. Morbid obesity - Body mass index is 53.09 kg/m.  -Discussed portion control.  5. Abnormal ECG with inferior Q waves - No s/s of ischemia.    - Refuses cath at this time.  6. Obesity hypoventilation syndrome - Continue oxygen.  Back on 2 L O2 at all times 7. OSA on BiPAP - Encouraged nightly use.  8. Bullous pemphigoid - Followed by Dermatology. No change.  -Will ask [name] to add wound care to the orders.   Follow up in 2-3 weeks with Dr Haroldine Laws. Continue HF paramedicine.   Greater than 50% of the 25 minute visit was spent in counseling/coordination of care regarding disease state education, salt/fluid restriction, sliding scale diuretics, and medication compliance.   [name], NP  9:17 AM

## 2017-12-16 NOTE — [place] Notes (Signed)
Paramedicine Encounter   Patient ID: Nina Howell , female,   DOB: 1940/07/16,76 y.o.,  MRN: [id number]   Met patient in clinic today with provider.  Time spent with patient 1.5 hrs  I went to pts house prior to this appointment due to EMS assist to get her downstairs to the car so she can come today. Pt states she isnt feeling too well, she is nauseated, however that isnt a new symptom for her. She also reports her bullious (skin disorder) is bothering her.  She has not taken her meds this morning yet except her thyroid med, she will take them once she gets home.   Weight @ home-281 today Yesterday-274  She reports taking another metolazone the other day per Levada Dy from [address] office. She took one last week as well. She reports her weight was 290something and she couldn't walk due to the swelling.  Her d/c weight was 296 a month ago.  Amy is increasing her torsemide to 112m BID with extra potassium as well. She does her own pill box and medications.  [facility].   B/P-106 doppler P-94 [id number]  KMarylouise Stacks EHickory Valley7/29/2019   ACTION: Home visit completed

## 2017-12-16 NOTE — Patient Instructions (Addendum)
INCREASE Torsemide to 100 mg twice daily. Can take 5 tablets twice daily of the 20 mg tablets you currently have at home. New Rx has been sent to your pharmacy for 100 mg tablets (Take 1 tab twice daily).  INCREASE Potassium to 40 meq (2 tabs) twice daily.  Routine lab work today. Will notify you of abnormal results, otherwise no news is good news!  Will order lower extremity wound care through [facility].  Call Dr. Cindra Eves office for Creon samples. Address: [address] [name], [place] [address] Phone: [phone]  Follow up with Dr. Haroldine Laws in 2-3 weeks.  __________________________________________________________________ Nina Howell Code: 1500  Take all medication as prescribed the day of your appointment. Bring all medications with you to your appointment.  Do the following things EVERYDAY: 1) Weigh yourself in the morning before breakfast. Write it down and keep it in a log. 2) Take your medicines as prescribed 3) Eat low salt foods-Limit salt (sodium) to 2000 mg per day.  4) Stay as active as you can everyday 5) Limit all fluids for the day to less than 2 liters

## 2017-12-17 ENCOUNTER — Other Ambulatory Visit: Payer: Self-pay | Admitting: Licensed Clinical Social Worker

## 2017-12-17 NOTE — Patient Outreach (Addendum)
[facility]) Care Management  12/17/2017  Joelynn Dust 06/06/1940 [id number]   [facility] CSW completed call to [facility] with [employer] and successfully made referral for patient in order to gain hand rails in the bathroom. Vaughan Basta looked up patient's address and informed [facility] CSW that it the residence is not in her name and that patient property value may not qualify her for their assistance program which is another concern. Vaughan Basta plans on contacting patient today on 12/17/17 to discuss program and eligibility.   [facility] CSW will follow up within two weeks.  Eula Fried, BSW, MSW, [email or web address] Phone: [phone] Fax: [phone]

## 2017-12-18 ENCOUNTER — Telehealth (HOSPITAL_COMMUNITY): Payer: Self-pay | Admitting: *Deleted

## 2017-12-18 NOTE — Telephone Encounter (Signed)
Nina Howell with [facility] called requesting orders for palliative care consult. Pt is agreeable. Pt experiences extreme episodes of pain and is slow but steadily declining. The consult is to provide an easy transition from skilled nursing to palliative care when the time comes. I called Nina Howell back to give verbal order no answer no voicemail.   [place]

## 2017-12-22 ENCOUNTER — Telehealth: Payer: Self-pay | Admitting: Cardiology

## 2017-12-22 ENCOUNTER — Inpatient Hospital Stay (HOSPITAL_COMMUNITY)
Admission: EM | Admit: 2017-12-22 | Discharge: 2017-12-28 | DRG: 286 | Disposition: A | Discharge status: Home Health | Payer: BLUE CROSS/BLUE SHIELD | Attending: Internal Medicine | Admitting: Internal Medicine

## 2017-12-22 ENCOUNTER — Emergency Department (HOSPITAL_COMMUNITY): Payer: BLUE CROSS/BLUE SHIELD

## 2017-12-22 ENCOUNTER — Encounter (HOSPITAL_COMMUNITY): Payer: Self-pay | Admitting: Emergency Medicine

## 2017-12-22 ENCOUNTER — Other Ambulatory Visit: Payer: Self-pay

## 2017-12-22 DIAGNOSIS — E876 Hypokalemia: Secondary | ICD-10-CM | POA: Diagnosis present

## 2017-12-22 DIAGNOSIS — I313 Pericardial effusion (noninflammatory): Secondary | ICD-10-CM | POA: Diagnosis present

## 2017-12-22 DIAGNOSIS — I4510 Unspecified right bundle-branch block: Secondary | ICD-10-CM | POA: Diagnosis present

## 2017-12-22 DIAGNOSIS — I5043 Acute on chronic combined systolic (congestive) and diastolic (congestive) heart failure: Secondary | ICD-10-CM | POA: Diagnosis present

## 2017-12-22 DIAGNOSIS — Z806 Family history of leukemia: Secondary | ICD-10-CM

## 2017-12-22 DIAGNOSIS — Z6841 Body Mass Index (BMI) 40.0 and over, adult: Secondary | ICD-10-CM

## 2017-12-22 DIAGNOSIS — Z66 Do not resuscitate: Secondary | ICD-10-CM | POA: Diagnosis present

## 2017-12-22 DIAGNOSIS — I429 Cardiomyopathy, unspecified: Secondary | ICD-10-CM | POA: Diagnosis present

## 2017-12-22 DIAGNOSIS — R000 Tachycardia, unspecified: Secondary | ICD-10-CM | POA: Diagnosis not present

## 2017-12-22 DIAGNOSIS — N183 Chronic kidney disease, stage 3 unspecified: Secondary | ICD-10-CM

## 2017-12-22 DIAGNOSIS — Z794 Long term (current) use of insulin: Secondary | ICD-10-CM

## 2017-12-22 DIAGNOSIS — J449 Chronic obstructive pulmonary disease, unspecified: Secondary | ICD-10-CM | POA: Diagnosis not present

## 2017-12-22 DIAGNOSIS — Z79890 Hormone replacement therapy (postmenopausal): Secondary | ICD-10-CM

## 2017-12-22 DIAGNOSIS — F329 Major depressive disorder, single episode, unspecified: Secondary | ICD-10-CM | POA: Diagnosis not present

## 2017-12-22 DIAGNOSIS — I2781 Cor pulmonale (chronic): Secondary | ICD-10-CM | POA: Diagnosis present

## 2017-12-22 DIAGNOSIS — I2729 Other secondary pulmonary hypertension: Secondary | ICD-10-CM | POA: Diagnosis present

## 2017-12-22 DIAGNOSIS — Z888 Allergy status to other drugs, medicaments and biological substances status: Secondary | ICD-10-CM

## 2017-12-22 DIAGNOSIS — Z833 Family history of diabetes mellitus: Secondary | ICD-10-CM

## 2017-12-22 DIAGNOSIS — Z79899 Other long term (current) drug therapy: Secondary | ICD-10-CM | POA: Diagnosis not present

## 2017-12-22 DIAGNOSIS — I130 Hypertensive heart and chronic kidney disease with heart failure and stage 1 through stage 4 chronic kidney disease, or unspecified chronic kidney disease: Principal | ICD-10-CM | POA: Diagnosis present

## 2017-12-22 DIAGNOSIS — E1122 Type 2 diabetes mellitus with diabetic chronic kidney disease: Secondary | ICD-10-CM | POA: Diagnosis present

## 2017-12-22 DIAGNOSIS — Z7951 Long term (current) use of inhaled steroids: Secondary | ICD-10-CM | POA: Diagnosis not present

## 2017-12-22 DIAGNOSIS — I5082 Biventricular heart failure: Secondary | ICD-10-CM | POA: Diagnosis present

## 2017-12-22 DIAGNOSIS — I4581 Long QT syndrome: Secondary | ICD-10-CM | POA: Diagnosis present

## 2017-12-22 DIAGNOSIS — E785 Hyperlipidemia, unspecified: Secondary | ICD-10-CM | POA: Diagnosis present

## 2017-12-22 DIAGNOSIS — R570 Cardiogenic shock: Secondary | ICD-10-CM

## 2017-12-22 DIAGNOSIS — G4733 Obstructive sleep apnea (adult) (pediatric): Secondary | ICD-10-CM | POA: Diagnosis not present

## 2017-12-22 DIAGNOSIS — L120 Bullous pemphigoid: Secondary | ICD-10-CM | POA: Diagnosis not present

## 2017-12-22 DIAGNOSIS — E039 Hypothyroidism, unspecified: Secondary | ICD-10-CM | POA: Diagnosis present

## 2017-12-22 DIAGNOSIS — Z885 Allergy status to narcotic agent status: Secondary | ICD-10-CM

## 2017-12-22 DIAGNOSIS — I110 Hypertensive heart disease with heart failure: Secondary | ICD-10-CM | POA: Diagnosis not present

## 2017-12-22 DIAGNOSIS — Z808 Family history of malignant neoplasm of other organs or systems: Secondary | ICD-10-CM

## 2017-12-22 DIAGNOSIS — Z9851 Tubal ligation status: Secondary | ICD-10-CM

## 2017-12-22 DIAGNOSIS — Z880 Allergy status to penicillin: Secondary | ICD-10-CM

## 2017-12-22 DIAGNOSIS — K7460 Unspecified cirrhosis of liver: Secondary | ICD-10-CM | POA: Diagnosis not present

## 2017-12-22 DIAGNOSIS — I5023 Acute on chronic systolic (congestive) heart failure: Secondary | ICD-10-CM | POA: Diagnosis not present

## 2017-12-22 DIAGNOSIS — I440 Atrioventricular block, first degree: Secondary | ICD-10-CM | POA: Diagnosis present

## 2017-12-22 DIAGNOSIS — L0103 Bullous impetigo: Secondary | ICD-10-CM | POA: Diagnosis present

## 2017-12-22 DIAGNOSIS — I959 Hypotension, unspecified: Secondary | ICD-10-CM | POA: Diagnosis not present

## 2017-12-22 DIAGNOSIS — I5033 Acute on chronic diastolic (congestive) heart failure: Secondary | ICD-10-CM | POA: Diagnosis not present

## 2017-12-22 DIAGNOSIS — I071 Rheumatic tricuspid insufficiency: Secondary | ICD-10-CM | POA: Diagnosis not present

## 2017-12-22 DIAGNOSIS — E118 Type 2 diabetes mellitus with unspecified complications: Secondary | ICD-10-CM

## 2017-12-22 DIAGNOSIS — Z7982 Long term (current) use of aspirin: Secondary | ICD-10-CM

## 2017-12-22 DIAGNOSIS — R0602 Shortness of breath: Secondary | ICD-10-CM | POA: Diagnosis not present

## 2017-12-22 DIAGNOSIS — E662 Morbid (severe) obesity with alveolar hypoventilation: Secondary | ICD-10-CM | POA: Diagnosis present

## 2017-12-22 DIAGNOSIS — I872 Venous insufficiency (chronic) (peripheral): Secondary | ICD-10-CM | POA: Diagnosis not present

## 2017-12-22 DIAGNOSIS — R0902 Hypoxemia: Secondary | ICD-10-CM | POA: Diagnosis not present

## 2017-12-22 DIAGNOSIS — Z881 Allergy status to other antibiotic agents status: Secondary | ICD-10-CM

## 2017-12-22 DIAGNOSIS — I272 Pulmonary hypertension, unspecified: Secondary | ICD-10-CM | POA: Diagnosis not present

## 2017-12-22 DIAGNOSIS — Z9981 Dependence on supplemental oxygen: Secondary | ICD-10-CM

## 2017-12-22 DIAGNOSIS — I351 Nonrheumatic aortic (valve) insufficiency: Secondary | ICD-10-CM | POA: Diagnosis not present

## 2017-12-22 DIAGNOSIS — I340 Nonrheumatic mitral (valve) insufficiency: Secondary | ICD-10-CM | POA: Diagnosis not present

## 2017-12-22 DIAGNOSIS — R600 Localized edema: Secondary | ICD-10-CM | POA: Diagnosis present

## 2017-12-22 DIAGNOSIS — E119 Type 2 diabetes mellitus without complications: Secondary | ICD-10-CM | POA: Diagnosis not present

## 2017-12-22 DIAGNOSIS — E8770 Fluid overload, unspecified: Secondary | ICD-10-CM | POA: Diagnosis not present

## 2017-12-22 DIAGNOSIS — E8779 Other fluid overload: Secondary | ICD-10-CM

## 2017-12-22 DIAGNOSIS — R9431 Abnormal electrocardiogram [ECG] [EKG]: Secondary | ICD-10-CM | POA: Diagnosis not present

## 2017-12-22 HISTORY — DX: Chronic kidney disease, stage 3 unspecified: N18.30

## 2017-12-22 HISTORY — DX: Biventricular heart failure: I50.82

## 2017-12-22 HISTORY — DX: Pulmonary hypertension, unspecified: I27.20

## 2017-12-22 HISTORY — DX: Chronic kidney disease, stage 3 (moderate): N18.3

## 2017-12-22 HISTORY — DX: Personal history of other diseases of the digestive system: Z87.19

## 2017-12-22 HISTORY — DX: Morbid (severe) obesity with alveolar hypoventilation: E66.2

## 2017-12-22 LAB — I-STAT TROPONIN, ED: TROPONIN I, POC: 0 ng/mL (ref 0.00–0.08)

## 2017-12-22 LAB — CBC WITH DIFFERENTIAL/PLATELET
Abs Immature Granulocytes: 0 10*3/uL (ref 0.0–0.1)
Basophils Absolute: 0.1 10*3/uL (ref 0.0–0.1)
Basophils Relative: 1 %
EOS ABS: 0 10*3/uL (ref 0.0–0.7)
Eosinophils Relative: 0 %
HCT: 32.8 % — ABNORMAL LOW (ref 36.0–46.0)
Hemoglobin: 10.2 g/dL — ABNORMAL LOW (ref 12.0–15.0)
Immature Granulocytes: 1 %
LYMPHS ABS: 1.5 10*3/uL (ref 0.7–4.0)
Lymphocytes Relative: 19 %
MCH: 27.3 pg (ref 26.0–34.0)
MCHC: 31.1 g/dL (ref 30.0–36.0)
MCV: 87.9 fL (ref 78.0–100.0)
Monocytes Absolute: 0.8 10*3/uL (ref 0.1–1.0)
Monocytes Relative: 10 %
Neutro Abs: 5.8 10*3/uL (ref 1.7–7.7)
Neutrophils Relative %: 69 %
PLATELETS: 207 10*3/uL (ref 150–400)
RBC: 3.73 MIL/uL — ABNORMAL LOW (ref 3.87–5.11)
RDW: 17.1 % — ABNORMAL HIGH (ref 11.5–15.5)
WBC: 8.2 10*3/uL (ref 4.0–10.5)

## 2017-12-22 LAB — I-STAT CHEM 8, ED
BUN: 33 mg/dL — ABNORMAL HIGH (ref 8–23)
CALCIUM ION: 1.04 mmol/L — AB (ref 1.15–1.40)
Chloride: 91 mmol/L — ABNORMAL LOW (ref 98–111)
Creatinine, Ser: 1.3 mg/dL — ABNORMAL HIGH (ref 0.44–1.00)
Glucose, Bld: 213 mg/dL — ABNORMAL HIGH (ref 70–99)
HCT: 36 % (ref 36.0–46.0)
Hemoglobin: 12.2 g/dL (ref 12.0–15.0)
Potassium: 4.7 mmol/L (ref 3.5–5.1)
SODIUM: 131 mmol/L — AB (ref 135–145)
TCO2: 28 mmol/L (ref 22–32)

## 2017-12-22 LAB — BASIC METABOLIC PANEL
ANION GAP: 13 (ref 5–15)
BUN: 32 mg/dL — ABNORMAL HIGH (ref 8–23)
CALCIUM: 8.8 mg/dL — AB (ref 8.9–10.3)
CO2: 26 mmol/L (ref 22–32)
CREATININE: 1.42 mg/dL — AB (ref 0.44–1.00)
Chloride: 88 mmol/L — ABNORMAL LOW (ref 98–111)
GFR calc non Af Amer: 35 mL/min — ABNORMAL LOW (ref >60)
GFR, EST AFRICAN AMERICAN: 41 mL/min — AB (ref >60)
Glucose, Bld: 219 mg/dL — ABNORMAL HIGH (ref 70–99)
Potassium: 4.7 mmol/L (ref 3.5–5.1)
Sodium: 127 mmol/L — ABNORMAL LOW (ref 135–145)

## 2017-12-22 LAB — BRAIN NATRIURETIC PEPTIDE: B Natriuretic Peptide: 1645.6 pg/mL — ABNORMAL HIGH (ref 0.0–100.0)

## 2017-12-22 MED ORDER — NOREPINEPHRINE 4 MG/250ML-% IV SOLN: 0.0000 ug/min | Freq: Once | INTRAVENOUS | Status: DC

## 2017-12-22 NOTE — Telephone Encounter (Signed)
  Received notification from [facility] regarding pt status. Pt followed in the [facility] for chronic diastolic HF. Seen recently on 12/16/17. Office weight was 281 lb. Labs showed significant hypokalemia at 2.6. SCr was 1.1. BNP was 1,895 (down from 2,666). Pt given instruction to increase Kdur to 40 mEq TID and get repeat BMP in 1 week. Clinic f/u scheduled 01/08/18.    Pt now with 8 lb weight gain in 24 hrs. Home weight today was 291 lb (10 lb up from recent office weight 1 week ago). She reports full med compliance with diuretics and denies dietary indiscretion w/ sodium. RN notes 2+ bilateral LEE, weeping. She also has chronic respiratory failure and is on 4L [place]. O2 sats at 90% on high flow. Meds reviewed. She is maxed out on torsemide, at 100 mg BID (max recommend daily dose is 200 mg). She is also on spironolactone 25 mg daily. RN also notes hypotension w/ SBP in the upper 80s. Pt mentation is ok and able to respond to questions asked over the phone but complains of dizziness. Given condition of hypervolemia with 10 lb weight gain, weeping lower extremity edema, hypotension and recent hypokalemia, I have recommended pt come to the ED for evaluation and admit for acute on chronic diastolic HF so that we can safely diurese while monitoring BP, renal function and electrolytes . The home health RN agrees. Pt will come to the ED.

## 2017-12-22 NOTE — H&P (Signed)
Cardiology Admission History and Physical:   Patient ID: Nina Howell; MRN: [id number]; DOB: [birth year]   Admission date: 12/22/2017  Primary Care Provider: Leeroy Cha, MD Primary Cardiologist: Loralie Champagne, MD   Chief Complaint:  Weight gain and worsening lower extremity edema  History of Present Illness:   Nina Howell is a 77 y.o. female with a history of combined systolic and diastolic CHF, obstructive sleep apnea, morbid obesity, diabetes mellitus, COPD and asthma who presents with complaints of recent weight gain and worsening lower extremity edema. She has had several admissions for volume overload and decompensated heart failure. During some of these hospitalizations she was diuresed with bumetanide and transitioned to torsemide on discharge to home.  She states compliance with her medications and her diet. She denies excessive use of salt. She is on supplemental oxygen. She has had a 10 pound weight gain in 2 days. She was evaluated by [place] RN. The home weight was 291 pounds up from 281 pounds one week ago. There was also a weeping lesion on the left leg. The patient's systolic blood pressure was in the 80s. It was felt that her home medications will not be effective in diuresing her efficiently. She was therefore advised to come to the hospital to get admitted.  She denies any fevers or chills. She does not report any palpitations, presyncope or syncope.  RECENT TESTING Echo 2011: Estimated PA pressure 56 mmHg and EF 45-50%. Cardiac cath 2011:  Normal coronaries 2011 Echo 2017: RV severely dilated and LV EF 50-55%. With RVSP 69. Moderate pericardial effusion  VQ 08/25/15: Limited study due to body habitus. Possible 2 perfusion defects  CT 08/26/15: No PE Moderate to large pericardial effusion Echo 10/2016:Marland Kitchen Very poor images  LVEF 55-60% RV dilated. Moderate HK. No TR to measure RVSP  No effusion     Past Medical History:  Diagnosis Date  .  Acute pancreatitis   . Anemia   . Asymptomatic cholelithiasis   . Bullous pemphigoid   . Cellulitis    ABDOMINAL WALL  . CHF (congestive heart failure) (HCC)    DECOMPENSATED SYSTOLIC CHF  . CHF (congestive heart failure) ([place])   . CTS (carpal tunnel syndrome)   . Diabetes mellitus, type 2 ([place])   . DJD (degenerative joint disease)   . Hepatic lesion   . Hyperlipidemia   . 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 Hypertension   . Hypokalemia   . Hypothyroidism   . Hypoventilation   . IUD (intrauterine device) in place   . Liver cirrhosis ([place])   . Morbid obesity ([place])   . 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 Sleep apnea    cpap  . Varicose veins     Past Surgical History:  Procedure Laterality Date  . CARDIAC CATHETERIZATION  2011   pt states "clean report"  . COLONOSCOPY WITH PROPOFOL N/A 12/24/2014   Procedure: COLONOSCOPY WITH PROPOFOL;  Surgeon: Carol Ada, MD;  Location: [facility];  Service: Endoscopy;  Laterality: N/A;  . DILATION AND CURETTAGE OF UTERUS    . SHOULDER SURGERY  2009   RIGHT SHOULDER  . TUBAL LIGATION       Medications Prior to Admission: Prior to Admission medications   Medication Sig Start Date End Date Taking? Authorizing Provider  acetaminophen (TYLENOL) 500 MG tablet Take 1,000 mg every 8 (eight) hours as needed by mouth for mild pain, moderate  pain, fever or headache.   Yes [provider]  ALPRAZolam Duanne Moron) 0.5 MG tablet Take 0.25 mg by mouth at bedtime as needed for anxiety.    Yes [provider]  aspirin EC 81 MG tablet Take 81 mg by mouth at bedtime.   Yes [provider]  atorvastatin (LIPITOR) 20 MG tablet Take 20 mg by mouth See admin instructions. Take one tablet (20 mg) by mouth every other night at bedtime   Yes [provider]  cholecalciferol (VITAMIN D) 1000 units tablet Take 1,000 Units by mouth daily  with lunch.    Yes [provider]  clobetasol ointment (TEMOVATE) 4.13 % Apply 1 application topically 2 (two) times daily.   Yes [provider]  Cranberry-Vitamin C-Vitamin E 4200-20-3 MG-MG-UNIT CAPS Take 4 capsules by mouth daily with lunch.   Yes [provider]  Cyanocobalamin (VITAMIN B-12) 2500 MCG SUBL Place 2,500 mcg under the tongue daily with lunch.    Yes [provider]  doxycycline (VIBRA-TABS) 100 MG tablet Take 100 mg by mouth 2 (two) times daily.   Yes [provider]  fluticasone furoate-vilanterol (BREO ELLIPTA) 200-25 MCG/INH AEPB Inhale 1 puff into the lungs daily. 08/15/17  Yes Rigoberto Noel, MD  guaiFENesin (MUCINEX) 600 MG 12 hr tablet Take 1,200 mg by mouth 2 (two) times daily as needed for cough or to loosen phlegm. Started 10/30    Yes [provider]  insulin NPH-regular Human (NOVOLIN 70/30) (70-30) 100 UNIT/ML injection Inject 16-26 Units into the skin See admin instructions. Inject 26 units subcutaneously daily after breakfast and 16 units after supper   Yes [provider]  levothyroxine (SYNTHROID, LEVOTHROID) 50 MCG tablet Take 50 mcg by mouth daily before breakfast.    Yes [provider]  lidocaine (XYLOCAINE) 5 % ointment Apply 1 application 2 (two) times daily topically.    Yes [provider]  lipase/protease/amylase (CREON) 36000 UNITS CPEP capsule Take 24,401-02,725 Units by mouth See admin instructions. Take 2 capsules (72,000 units) by mouth up to 3 times daily with meals, take 1 capsule (36,000 units) with snacks   Yes [provider]  metolazone (ZAROXOLYN) 2.5 MG tablet Take 2.5 mg by mouth See admin instructions. Take when directed to do so by the heart failure clinic. 12/10/17  Yes [provider]  Multiple Vitamin (MULTIVITAMIN WITH MINERALS) TABS tablet Take 0.5 tablets by mouth daily. Centrum Silver   Yes [provider]  niacinamide 500 MG  tablet Take 500 mg by mouth 2 (two) times daily with a meal.   Yes [provider]  OXYGEN Inhale 4 L into the lungs continuous.   Yes [provider]  potassium chloride SA (K-DUR,KLOR-CON) 20 MEQ tablet Take 2 tablets (40 mEq total) by mouth 2 (two) times daily. Patient taking differently: Take 20 mEq by mouth 3 (three) times daily.  12/16/17  Yes Clegg, Amy D, NP  PROAIR HFA 108 (90 Base) MCG/ACT inhaler INHALE TWO PUFFS BY MOUTH EVERY 4 HOURS AS NEEDED FOR WHEEZING OR FOR SHORTNESS OF BREATH 08/20/17  Yes Rigoberto Noel, MD  sertraline (ZOLOFT) 50 MG tablet Take 50 mg by mouth at bedtime.    Yes [provider]  spironolactone (ALDACTONE) 25 MG tablet Take 1 tablet (25 mg total) by mouth daily. Please call [phone] to schedule appointment for additional refills thanks. Patient taking differently: Take 25 mg by mouth daily with lunch. Please call [phone] to schedule appointment for additional refills  thanks. 09/09/17  Yes Josue Hector, MD  torsemide (DEMADEX) 100 MG tablet Take 1 tablet (100 mg total) by mouth 2 (two) times daily. Patient taking differently: Take 100 mg by mouth at bedtime.  12/16/17  Yes Clegg, Amy D, NP  torsemide (DEMADEX) 20 MG tablet Take 100 mg by mouth daily.   Yes [provider]  polyethylene glycol (MIRALAX / GLYCOLAX) packet Take 17 g by mouth daily as needed. Patient not taking: Reported on 12/11/2017 08/27/15   Jonetta Osgood, MD     Allergies:    Allergies  Allergen Reactions  . Celebrex [Celecoxib] Swelling  . Penicillins Rash    Has patient had a PCN reaction causing immediate rash, facial/tongue/throat swelling, SOB or lightheadedness with hypotension: Yes Has patient had a PCN reaction causing severe rash involving mucus membranes or skin necrosis: Yes Has patient had a PCN reaction that required hospitalization: No Has patient had a PCN reaction occurring within the last 10 years: Yes - Okay taking other  cillin's If all of the above answers are "NO", then may proceed with Cephalosporin use.   . Cephalexin Hives  . Lasix [Furosemide] Hives  . Oxycodone-Acetaminophen Nausea Only  . Cephalosporins Rash  . Oxycodone Anxiety    Felt weird    Social History:   Social History   Socioeconomic History  . Marital status: Married    Spouse name: Not on file  . Number of children: 4  . Years of education: Not on file  . Highest education level: Not on file  Occupational History  . 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 Financial resource strain: Patient refused  . Food insecurity:    Worry: Patient refused    Inability: Patient refused  . Transportation needs:    Medical: Patient refused    Non-medical: Patient refused  Tobacco Use  . Smoking status: Never Smoker  . Smokeless tobacco: Never Used  Substance and Sexual Activity  . Alcohol use: No  . Drug use: No  . Sexual activity: Yes    Birth control/protection: IUD  Lifestyle  . Physical activity:    Days per week: Patient refused    Minutes per session: Patient refused  . Stress: Patient refused  Relationships  . Social connections:    Talks on phone: Patient refused    Gets together: Patient refused    Attends religious service: Patient refused    Active member of club or organization: Patient refused    Attends meetings of clubs or organizations: Patient refused    Relationship status: Patient refused  . Intimate partner violence:    Fear of current or ex partner: Patient refused    Emotionally abused: Patient refused    Physically abused: Patient refused    Forced sexual activity: Patient refused  Other Topics Concern  . Not on file  Social History Narrative  . Not on file    Family History: The patient's family history includes Cancer in her mother; Diabetes in her father; Leukemia in her father.    Review of Systems: [y] = yes, [ ]  = no   . General: Weight  gain [ ] ; Weight loss [ ] ; Anorexia [ ] ; Fatigue [Y ]; Fever [ ] ; Chills [ ] ; Weakness [ ]   . Cardiac: Chest pain/pressure [ ] ; Resting SOB [ ] ; Exertional SOB [ Y]; Orthopnea [ ] ; Pedal Edema [Y]; Palpitations [ ] ; Syncope [ ] ; Presyncope [ ] ;  Paroxysmal nocturnal dyspnea[ ]   . Pulmonary: Cough [ ] ; Wheezing[ ] ; Hemoptysis[ ] ; Sputum [ ] ; Snoring [Y ]  . GI: Vomiting[ ] ; Dysphagia[ ] ; Melena[ ] ; Hematochezia [ ] ; Heartburn[ ] ; Abdominal pain [ ] ; Constipation [ ] ; Diarrhea [ ] ; BRBPR [ ]   . GU: Hematuria[ ] ; Dysuria [ ] ; Nocturia[ ]   . Vascular: Pain in legs with walking [ ] ; Pain in feet with lying flat [ ] ; Non-healing sores [ ] ; Stroke [ ] ; TIA [ ] ; Slurred speech [ ] ;  . Neuro: Headaches[ ] ; Vertigo[ ] ; Seizures[ ] ; Paresthesias[ ] ;Blurred vision [ ] ; Diplopia [ ] ; Vision changes [ ]   . Ortho/Skin: Arthritis [ ] ; Joint pain [ ] ; Muscle pain [ ] ; Joint swelling [ ] ; Back Pain [ ] ; Rash [ ]   . Psych: Depression[ ] ; Anxiety[ ]   . Heme: Bleeding problems [ ] ; Clotting disorders [ ] ; Anemia [ ]   . Endocrine: Diabetes [ ] ; Thyroid dysfunction[ ]     Physical Exam/Data:   Vitals:   12/22/17 2217 12/22/17 2230 12/22/17 2245 12/22/17 2300  BP:      Pulse: 90 90 89 88  Resp: 18 (!) 24 20 (!) 23  SpO2: 99% 99% 99% 99%  Weight:      Height:       No intake or output data in the 24 hours ending 12/22/17 2306 Filed Weights   12/22/17 1750  Weight: 132 kg (291 lb)   Body mass index is 54.98 kg/m.  General:  Morbidly obese, appears ill HEENT: normal Lymph: no adenopathy Neck: Unable to assess JVD due to body habitus Endocrine:  No thryomegaly Vascular: No carotid bruits;   Cardiac:  Distant heart sounds Lungs:  clear to auscultation bilaterally, no wheezing, rhonchi or rales  Abd: soft, nontender, no hepatomegaly  Ext: 1+ edema of the bilateral lower extremities, a weeping lesion on the left leg. Musculoskeletal:  No deformities, BUE and BLE strength normal and equal Skin: warm and dry    Neuro:  CNs 2-12 intact, no focal abnormalities noted Psych:  Normal affect    EKG:  Sinus rhythm, rate 94 bpm, incomplete right bundle branch block, left anterior fascicular block  Relevant CV Studies: Echo 2011: Estimated PA pressure 56 mmHg and EF 45-50%. Cardiac cath 2011:  Normal coronaries 2011 Echo 2017: RV severely dilated and LV EF 50-55%. With RVSP 69. Moderate pericardial effusion  VQ 08/25/15: Limited study due to body habitus. Possible 2 perfusion defects  CT 08/26/15: No PE Moderate to large pericardial effusion Echo 10/2016:Marland Kitchen Very poor images  LVEF 55-60% RV dilated. Moderate HK. No TR to measure RVSP  No effusion    Laboratory Data:  Chemistry Recent Labs  Lab 12/16/17 0943 12/22/17 1817 12/22/17 1842  NA 135 127* 131*  K 2.6* 4.7 4.7  CL 89* 88* 91*  CO2 34* 26  --   GLUCOSE 303* 219* 213*  BUN 30* 32* 33*  CREATININE 1.11* 1.42* 1.30*  CALCIUM 9.0 8.8*  --   GFRNONAA 47* 35*  --   GFRAA 55* 41*  --   ANIONGAP 12 13  --     No results for input(s): PROT, ALBUMIN, AST, ALT, ALKPHOS, BILITOT in the last 168 hours. Hematology Recent Labs  Lab 12/22/17 1817 12/22/17 1842  WBC 8.2  --   RBC 3.73*  --   HGB 10.2* 12.2  HCT 32.8* 36.0  MCV 87.9  --   MCH 27.3  --   MCHC 31.1  --  RDW 17.1*  --   PLT 207  --    Cardiac EnzymesNo results for input(s): TROPONINI in the last 168 hours.  Recent Labs  Lab 12/22/17 1840  TROPIPOC 0.00    BNP Recent Labs  Lab 12/16/17 0943 12/22/17 1817  BNP 1,895.1* 1,645.6*    DDimer No results for input(s): DDIMER in the last 168 hours.  Radiology/Studies:  Dg Chest 2 View  Result Date: 12/22/2017 CLINICAL DATA:  Shortness of breath EXAM: CHEST - 2 VIEW COMPARISON:  11/10/2017 FINDINGS: Lungs are clear.  No pleural effusion or pneumothorax. Stable severe cardiomegaly. Degenerative changes of the visualized thoracolumbar spine. IMPRESSION: No evidence of acute cardiopulmonary disease. Stable severe cardiomegaly.  Electronically Signed   By: Julian Hy M.D.   On: 12/22/2017 18:51    Assessment and Plan:   1. Acute on chronic combined systolic and diastolic heart failure The patient is in NYHA functional class III and IV heart failure. Her mentation is normal and her extremities are warm to touch.  - Will treat with bumetanide - given as needed - She has a noted allergy to Lasix - Hold PO torsemide - Continue spironolactone 25 mg daily - Continue metolazone 2.5 mg daily - Potassium and magnesium supplementation as necessary. - Goal is 1-2 liters negative in the next 24 hours. Will monitor urine output and renal function   2. Obesity hypoventilation syndrome  - Continue 2-4 L of supplemental oxygen at all times. - Encourage compliance with BiPAP for OSA.   3. 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 Will attempt careful diuresis with intravenous diuretics   Severity of Illness: The appropriate patient status for this patient is INPATIENT. Inpatient status is judged to be reasonable and necessary in order to provide the required intensity of service to ensure the patient's safety. The patient's presenting symptoms, physical exam findings, and initial radiographic and laboratory data in the context of their chronic comorbidities is felt to place them at high risk for further clinical deterioration. Furthermore, it is not anticipated that the patient will be medically stable for discharge from the hospital within 2 midnights of admission. The following factors support the patient status of inpatient.   " The patient's presenting symptoms include shortness of breath. " The worrisome physical exam findings include leg edema. " The initial radiographic and laboratory data are worrisome because of BNP 1645.6. " The chronic co-morbidities include pulmonary hypertension.   * I certify  that at the point of admission it is my clinical judgment that the patient will require inpatient hospital care spanning beyond 2 midnights from the point of admission due to high intensity of service, high risk for further deterioration and high frequency of surveillance required.*    For questions or updates, please contact [place] Please consult [email or web address] for contact info under Cardiology/STEMI.    Signed, Meade Maw, MD  12/22/2017 11:06 PM

## 2017-12-22 NOTE — ED Provider Notes (Signed)
[facility] Date/Time: 12/22/2017 10:13 PM Performed by: Alford Highland, MD Authorized by: Deno Etienne, DO   Consent:    Consent obtained:  Written   Consent given by:  Patient   Risks discussed:  Bleeding, infection, ischemia, pain and repeat procedure Indications:    Indications: hemodynamic monitoring   Pre-procedure details:    Skin preparation:  2% Chlorhexidine Procedure details:    Location:  L radial   Needle gauge:  20 G   Placement technique:  Seldinger and ultrasound guided   Number of attempts:  1   Transducer: waveform confirmed   Post-procedure details:    Post-procedure:  Biopatch applied, secured with tape, sterile dressing applied and sutured   CMS:  Normal and unchanged   Patient tolerance of procedure:  Tolerated well, no immediate complications      Alford Highland, MD 12/22/17 [place], [place], DO 12/22/17 2336

## 2017-12-22 NOTE — ED Notes (Signed)
RT aware of need for [place]

## 2017-12-22 NOTE — ED Notes (Signed)
ED Provider at bedside. 

## 2017-12-22 NOTE — ED Triage Notes (Addendum)
Per EMS, pt from home. Pt's home nurse noticed pt was hypotensive, sob, increased bilateral edema in LE, and gained 10lbs in 24 hours. Pt's doctor requested pt be sent to hospital. Lung sounds clear per ems. Pt reports leg cramps 7/10, A&Ox4. Hx CHF.   EMS VS BP 92/60, HR 96, R 18, 94% on 2L [place] which pt wears at home, CBG 234.

## 2017-12-22 NOTE — ED Provider Notes (Addendum)
[facility] EMERGENCY DEPARTMENT Provider Note   CSN: [id number] Arrival date & time: 12/22/17  1740     History   Chief Complaint Chief Complaint  Patient presents with  . Leg Swelling  . Hypotension  . Shortness of Breath    HPI Nina Howell is a 77 y.o. female.  77 yo F with a chief complaint of shortness of breath and worsening lower extremity edema.  The patient has had a 10 pound weight gain in 2 days.  She is felt more fatigued than normal.  She denies chest pain denies cough or fever.  Denies abdominal pain vomiting or diarrhea.  She has been on a very elevated dose of diuretics.  She denies any noncompliance.  Denies increased fluid intake.  The history is provided by the patient and a relative.  Shortness of Breath  This is a new problem. The average episode lasts 2 days. The problem occurs continuously.The current episode started 2 days ago. The problem has been rapidly worsening. Associated symptoms include leg swelling. Pertinent negatives include no fever, no headaches, no rhinorrhea, no wheezing, no chest pain and no vomiting. She has tried nothing for the symptoms. The treatment provided no relief. She has had no prior hospitalizations. She has had no prior ED visits. Associated medical issues include heart failure. Associated medical issues do not include past MI.    Past Medical History:  Diagnosis Date  . Acute pancreatitis   . Anemia   . Asymptomatic cholelithiasis   . Bullous pemphigoid   . Cellulitis    ABDOMINAL WALL  . CHF (congestive heart failure) (HCC)    DECOMPENSATED SYSTOLIC CHF  . CHF (congestive heart failure) ([place])   . CTS (carpal tunnel syndrome)   . Diabetes mellitus, type 2 ([place])   . DJD (degenerative joint disease)   . Hepatic lesion   . Hyperlipidemia   . 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 Hypertension   . Hypokalemia   . Hypothyroidism   . Hypoventilation   . IUD (intrauterine device) in  place   . Liver cirrhosis ([place])   . Morbid obesity ([place])   . 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 Sleep apnea    cpap  . Varicose veins     Patient Active Problem List   Diagnosis Date Noted  . Pressure injury of skin 11/11/2017  . Chronic respiratory failure with hypoxia ([place]) 04/03/2017  . Pericardial effusion 11/23/2015  . Chronic diastolic heart failure ([place]) 11/23/2015  . Pulmonary hypertension ([place]) 09/06/2015  . Acute on chronic congestive heart failure ([place])   . Rash and nonspecific skin eruption 11/27/2014  . Post-menopausal bleeding 06/18/2011  . Varicose veins 04/25/2011  . Unspecified venous (peripheral) insufficiency 04/25/2011  . Hemangioma of liver, left side 03/23/2011  . HYPOTHYROIDISM 04/27/2010  . Type II or unspecified type diabetes mellitus without mention of complication, not stated as uncontrolled 04/27/2010  . HYPOKALEMIA 04/27/2010  . Morbid obesity ([place]) 04/27/2010  . DEPRESSION 04/27/2010  . Obstructive sleep apnea 04/27/2010  . Essential hypertension 04/27/2010  . Acute on chronic systolic (congestive) heart failure ([place]) 04/27/2010  . Obesity hypoventilation syndrome ([place]) 04/27/2010    Past Surgical History:  Procedure Laterality Date  . CARDIAC CATHETERIZATION  2011   pt states "clean report"  . COLONOSCOPY WITH PROPOFOL N/A 12/24/2014   Procedure: COLONOSCOPY WITH PROPOFOL;  Surgeon: Carol Ada, MD;  Location:  [facility];  Service: Endoscopy;  Laterality: N/A;  . DILATION AND CURETTAGE OF UTERUS    . SHOULDER SURGERY  2009   RIGHT SHOULDER  . TUBAL LIGATION       OB History   None      Home Medications    Prior to Admission medications   Medication Sig Start Date End Date Taking? Authorizing Provider  acetaminophen (TYLENOL) 500 MG tablet Take 1,000 mg every 8 (eight) hours as needed by mouth for mild pain, moderate pain, fever or headache.   Yes  [provider]  ALPRAZolam Duanne Moron) 0.5 MG tablet Take 0.25 mg by mouth at bedtime as needed for anxiety.    Yes [provider]  aspirin EC 81 MG tablet Take 81 mg by mouth at bedtime.   Yes [provider]  atorvastatin (LIPITOR) 20 MG tablet Take 20 mg by mouth See admin instructions. Take one tablet (20 mg) by mouth every other night at bedtime   Yes [provider]  cholecalciferol (VITAMIN D) 1000 units tablet Take 1,000 Units by mouth daily with lunch.    Yes [provider]  clobetasol ointment (TEMOVATE) 0.25 % Apply 1 application topically 2 (two) times daily.   Yes [provider]  Cranberry-Vitamin C-Vitamin E 4200-20-3 MG-MG-UNIT CAPS Take 4 capsules by mouth daily with lunch.   Yes [provider]  Cyanocobalamin (VITAMIN B-12) 2500 MCG SUBL Place 2,500 mcg under the tongue daily with lunch.    Yes [provider]  doxycycline (VIBRA-TABS) 100 MG tablet Take 100 mg by mouth 2 (two) times daily.   Yes [provider]  fluticasone furoate-vilanterol (BREO ELLIPTA) 200-25 MCG/INH AEPB Inhale 1 puff into the lungs daily. 08/15/17  Yes Rigoberto Noel, MD  guaiFENesin (MUCINEX) 600 MG 12 hr tablet Take 1,200 mg by mouth 2 (two) times daily as needed for cough or to loosen phlegm. Started 10/30    Yes [provider]  insulin NPH-regular Human (NOVOLIN 70/30) (70-30) 100 UNIT/ML injection Inject 16-26 Units into the skin See admin instructions. Inject 26 units subcutaneously daily after breakfast and 16 units after supper   Yes [provider]  levothyroxine (SYNTHROID, LEVOTHROID) 50 MCG tablet Take 50 mcg by mouth daily before breakfast.    Yes [provider]  lidocaine (XYLOCAINE) 5 % ointment Apply 1 application 2 (two) times daily topically.    Yes [provider]  lipase/protease/amylase (CREON) 36000 UNITS CPEP capsule Take 85,277-82,423 Units by mouth See admin  instructions. Take 2 capsules (72,000 units) by mouth up to 3 times daily with meals, take 1 capsule (36,000 units) with snacks   Yes [provider]  metolazone (ZAROXOLYN) 2.5 MG tablet Take 2.5 mg by mouth See admin instructions. Take when directed to do so by the heart failure clinic. 12/10/17  Yes [provider]  Multiple Vitamin (MULTIVITAMIN WITH MINERALS) TABS tablet Take 0.5 tablets by mouth daily. Centrum Silver   Yes [provider]  niacinamide 500 MG tablet Take 500 mg by mouth 2 (two) times daily with a meal.   Yes [provider]  OXYGEN Inhale 4 L into the lungs continuous.   Yes [provider]  potassium chloride SA (K-DUR,KLOR-CON) 20 MEQ tablet Take 2 tablets (40 mEq total) by mouth 2 (two) times daily. Patient taking differently: Take 20 mEq by mouth 3 (three) times daily.  12/16/17  Yes Clegg, Amy D, NP  PROAIR HFA 108 (90 Base) MCG/ACT inhaler INHALE  TWO PUFFS BY MOUTH EVERY 4 HOURS AS NEEDED FOR WHEEZING OR FOR SHORTNESS OF BREATH 08/20/17  Yes Rigoberto Noel, MD  sertraline (ZOLOFT) 50 MG tablet Take 50 mg by mouth at bedtime.    Yes [provider]  spironolactone (ALDACTONE) 25 MG tablet Take 1 tablet (25 mg total) by mouth daily. Please call [phone] to schedule appointment for additional refills thanks. Patient taking differently: Take 25 mg by mouth daily with lunch. Please call [phone] to schedule appointment for additional refills thanks. 09/09/17  Yes Josue Hector, MD  torsemide (DEMADEX) 100 MG tablet Take 1 tablet (100 mg total) by mouth 2 (two) times daily. Patient taking differently: Take 100 mg by mouth at bedtime.  12/16/17  Yes Clegg, Amy D, NP  torsemide (DEMADEX) 20 MG tablet Take 100 mg by mouth daily.   Yes [provider]  polyethylene glycol (MIRALAX / GLYCOLAX) packet Take 17 g by mouth daily as needed. Patient not taking: Reported on 12/11/2017 08/27/15   Jonetta Osgood, MD     Family History Family History  Problem Relation Age of Onset  . Cancer Mother        throat  . Diabetes Father   . Leukemia Father     Social History Social History   Tobacco Use  . Smoking status: Never Smoker  . Smokeless tobacco: Never Used  Substance Use Topics  . Alcohol use: No  . Drug use: No     Allergies   Celebrex [celecoxib]; Penicillins; Cephalexin; Lasix [furosemide]; Oxycodone-acetaminophen; Cephalosporins; and Oxycodone   Review of Systems Review of Systems  Constitutional: Negative for chills and fever.  HENT: Negative for congestion and rhinorrhea.   Eyes: Negative for redness and visual disturbance.  Respiratory: Positive for shortness of breath. Negative for wheezing.   Cardiovascular: Positive for leg swelling. Negative for chest pain and palpitations.  Gastrointestinal: Negative for nausea and vomiting.  Genitourinary: Negative for dysuria and urgency.  Musculoskeletal: Negative for arthralgias and myalgias.  Skin: Negative for pallor and wound.  Neurological: Positive for weakness. Negative for dizziness and headaches.     Physical Exam Updated Vital Signs BP 96/64   Pulse 88   Resp (!) 23   Ht 5\' 1"  (1.549 m)   Wt 132 kg (291 lb)   SpO2 99%   BMI 54.98 kg/m   Physical Exam  Constitutional: She is oriented to person, place, and time. She appears well-developed and well-nourished. No distress.  Morbidly obese, chronically ill-appearing.  HENT:  Head: Normocephalic and atraumatic.  Eyes: Pupils are equal, round, and reactive to light. EOM are normal.  Neck: Normal range of motion. Neck supple.  Cardiovascular: Normal rate and regular rhythm. Exam reveals no gallop and no friction rub.  No murmur heard. Pulmonary/Chest: Effort normal. She has no wheezes. She has no rales.  Abdominal: Soft. She exhibits no distension. There is no tenderness.  Musculoskeletal: She exhibits edema. She exhibits no tenderness.  4+ pitting edema  bilaterally up to the thighs.  Worse on the left than the right which is chronic for the patient.  Neurological: She is alert and oriented to person, place, and time.  Skin: Skin is warm and dry. She is not diaphoretic.  Psychiatric: She has a normal mood and affect. Her behavior is normal.  Nursing note and vitals reviewed.    ED Treatments / Results  Labs (all labs ordered are listed, but only abnormal results are displayed) Labs Reviewed  CBC WITH DIFFERENTIAL/PLATELET -  Abnormal; Notable for the following components:      Result Value   RBC 3.73 (*)    Hemoglobin 10.2 (*)    HCT 32.8 (*)    RDW 17.1 (*)    All other components within normal limits  BASIC METABOLIC PANEL - Abnormal; Notable for the following components:   Sodium 127 (*)    Chloride 88 (*)    Glucose, Bld 219 (*)    BUN 32 (*)    Creatinine, Ser 1.42 (*)    Calcium 8.8 (*)    GFR calc non Af Amer 35 (*)    GFR calc Af Amer 41 (*)    All other components within normal limits  BRAIN NATRIURETIC PEPTIDE - Abnormal; Notable for the following components:   B Natriuretic Peptide 1,645.6 (*)    All other components within normal limits  I-STAT CHEM 8, ED - Abnormal; Notable for the following components:   Sodium 131 (*)    Chloride 91 (*)    BUN 33 (*)    Creatinine, Ser 1.30 (*)    Glucose, Bld 213 (*)    Calcium, Ion 1.04 (*)    All other components within normal limits  I-STAT TROPONIN, ED    EKG EKG Interpretation  Date/Time:  Sunday December 22 2017 17:50:38 EDT Ventricular Rate:  94 PR Interval:    QRS Duration: 100 QT Interval:  394 QTC Calculation: 493 R Axis:   -65 Text Interpretation:  Sinus rhythm Incomplete RBBB and LAFB Low voltage, precordial leads Borderline prolonged QT interval No significant change since last tracing Confirmed by Eliu Batch ([id number]) on 12/22/2017 6:32:15 PM   Radiology Dg Chest 2 View  Result Date: 12/22/2017 CLINICAL DATA:  Shortness of breath EXAM: CHEST - 2 VIEW  COMPARISON:  11/10/2017 FINDINGS: Lungs are clear.  No pleural effusion or pneumothorax. Stable severe cardiomegaly. Degenerative changes of the visualized thoracolumbar spine. IMPRESSION: No evidence of acute cardiopulmonary disease. Stable severe cardiomegaly. Electronically Signed   By: Sriyesh  Krishnan M.D.   On: 12/22/2017 18:51    Procedures Procedures (including critical care time)   EMERGENCY DEPARTMENT US CARDIAC EXAM "Study: Limited Ultrasound of the Heart and Pericardium"  INDICATIONS:Abnormal vital signs Multiple views of the heart and pericardium were obtained in real-time with a multi-frequency probe.  PERFORMED BY:Myself IMAGES ARCHIVED?: Yes LIMITATIONS:  Body habitus and Emergent procedure VIEWS USED: Subcostal 4 chamber, Parasternal long axis, Parasternal short axis, Apical 4 chamber  and Inferior Vena Cava INTERPRETATION: Cardiac activity present, Pericardial effusioin absent, Cardiac tamponade absent, Probable elevated CVP, Decreased contractility and IVC dilated   Medications Ordered in ED Medications - No data to display   Initial Impression / Assessment and Plan / ED Course  I have reviewed the triage vital signs and the nursing notes.  Pertinent labs & imaging results that were available during my care of the patient were reviewed by me and considered in my medical decision making (see chart for details).     76  yo F with a chief complaint of bilateral lower extremity edema and weakness and shortness of breath.  This been going on for the past few days.  She had a 10 pound weight gain in 1 day.  She called her cardiologist and was instructed to come to the ED.  Patient is hypertensive here, I wonder if it was related to her arm circumference though a repeat blood pressure measurement throughout her stay here remains in the upper 80s to low 90s.  Bedside ultrasound was performed with global hypokinesis.  IVC is dilated and has no respiratory variation.  The  patient has had no significant worsening.  Her last echo was done recently with an EF of 30%.  She has clear lung sounds on my exam.  I will discuss the case with cardiology place an art line.  Art line with higher BP than cuff, still in the low 100s.  Discussed with cards will admit.   CRITICAL CARE Performed by: Cecilio Asper   Total critical care time: 80 minutes  Critical care time was exclusive of separately billable procedures and treating other patients.  Critical care was necessary to treat or prevent imminent or life-threatening deterioration.  Critical care was time spent personally by me on the following activities: development of treatment plan with patient and/or surrogate as well as nursing, discussions with consultants, evaluation of patient's response to treatment, examination of patient, obtaining history from patient or surrogate, ordering and performing treatments and interventions, ordering and review of laboratory studies, ordering and review of radiographic studies, pulse oximetry and re-evaluation of patient's condition.  The patients results and plan were reviewed and discussed.   Any x-rays performed were independently reviewed by myself.   Differential diagnosis were considered with the presenting HPI.  Medications - No data to display  Vitals:   12/22/17 2217 12/22/17 2230 12/22/17 2245 12/22/17 2300  BP:      Pulse: 90 90 89 88  Resp: 18 (!) 24 20 (!) 23  SpO2: 99% 99% 99% 99%  Weight:      Height:        Final diagnoses:  Cardiogenic shock (HCC)  Other hypervolemia    Admission/ observation were discussed with the admitting physician, patient and/or family and they are comfortable with the plan.   Final Clinical Impressions(s) / ED Diagnoses   Final diagnoses:  Cardiogenic shock [facility])  Other hypervolemia    ED Discharge Orders    None       Deno Etienne, DO 12/22/17 [place], [place], DO 12/22/17 2314

## 2017-12-23 ENCOUNTER — Other Ambulatory Visit: Payer: Self-pay

## 2017-12-23 LAB — BASIC METABOLIC PANEL
Anion gap: 7 (ref 5–15)
Anion gap: 13 (ref 5–15)
BUN: 18 mg/dL (ref 8–23)
BUN: 26 mg/dL — AB (ref 8–23)
CALCIUM: 5.4 mg/dL — AB (ref 8.9–10.3)
CALCIUM: 8.9 mg/dL (ref 8.9–10.3)
CO2: 20 mmol/L — AB (ref 22–32)
CO2: 29 mmol/L (ref 22–32)
CREATININE: 0.67 mg/dL (ref 0.44–1.00)
CREATININE: 1.16 mg/dL — AB (ref 0.44–1.00)
Chloride: 116 mmol/L — ABNORMAL HIGH (ref 98–111)
Chloride: 92 mmol/L — ABNORMAL LOW (ref 98–111)
GFR calc non Af Amer: >60 mL/min (ref >60)
GFR calc non Af Amer: 45 mL/min — ABNORMAL LOW (ref >60)
GFR, EST AFRICAN AMERICAN: 52 mL/min — AB (ref >60)
GLUCOSE: 230 mg/dL — AB (ref 70–99)
Glucose, Bld: 172 mg/dL — ABNORMAL HIGH (ref 70–99)
Potassium: 2 mmol/L — CL (ref 3.5–5.1)
Potassium: 3.7 mmol/L (ref 3.5–5.1)
SODIUM: 143 mmol/L (ref 135–145)
Sodium: 134 mmol/L — ABNORMAL LOW (ref 135–145)

## 2017-12-23 LAB — GLUCOSE, CAPILLARY
GLUCOSE-CAPILLARY: 192 mg/dL — AB (ref 70–99)
Glucose-Capillary: 215 mg/dL — ABNORMAL HIGH (ref 70–99)
Glucose-Capillary: 234 mg/dL — ABNORMAL HIGH (ref 70–99)

## 2017-12-23 LAB — MRSA PCR SCREENING: MRSA by PCR: NEGATIVE

## 2017-12-23 LAB — CBC
HCT: 32.3 % — ABNORMAL LOW (ref 36.0–46.0)
Hemoglobin: 10 g/dL — ABNORMAL LOW (ref 12.0–15.0)
MCH: 27.2 pg (ref 26.0–34.0)
MCHC: 31 g/dL (ref 30.0–36.0)
MCV: 87.8 fL (ref 78.0–100.0)
PLATELETS: 192 10*3/uL (ref 150–400)
RBC: 3.68 MIL/uL — ABNORMAL LOW (ref 3.87–5.11)
RDW: 17.2 % — AB (ref 11.5–15.5)
WBC: 8.1 10*3/uL (ref 4.0–10.5)

## 2017-12-23 LAB — CREATININE, SERUM
Creatinine, Ser: 1.29 mg/dL — ABNORMAL HIGH (ref 0.44–1.00)
GFR, EST AFRICAN AMERICAN: 46 mL/min — AB (ref >60)
GFR, EST NON AFRICAN AMERICAN: 39 mL/min — AB (ref >60)

## 2017-12-23 LAB — HEMOGLOBIN A1C
Hgb A1c MFr Bld: 11 % — ABNORMAL HIGH (ref 4.8–5.6)
MEAN PLASMA GLUCOSE: 269 mg/dL

## 2017-12-23 LAB — MAGNESIUM: Magnesium: 1.1 mg/dL — ABNORMAL LOW (ref 1.7–2.4)

## 2017-12-23 MED ORDER — ASPIRIN EC 81 MG PO TBEC
81.0000 mg | DELAYED_RELEASE_TABLET | Freq: Every day | ORAL | Status: DC
  Administered 2017-12-23 – 2017-12-27 (×5): 81 mg via ORAL
  Filled 2017-12-23 (×5): qty 1

## 2017-12-23 MED ORDER — POTASSIUM CHLORIDE CRYS ER 20 MEQ PO TBCR
20.0000 meq | EXTENDED_RELEASE_TABLET | Freq: Every day | ORAL | Status: DC
  Administered 2017-12-23 – 2017-12-24 (×2): 20 meq via ORAL
  Filled 2017-12-23 (×2): qty 1

## 2017-12-23 MED ORDER — ASPIRIN EC 81 MG PO TBEC
81.0000 mg | DELAYED_RELEASE_TABLET | Freq: Every day | ORAL | Status: DC
  Filled 2017-12-23: qty 1

## 2017-12-23 MED ORDER — POTASSIUM CHLORIDE CRYS ER 20 MEQ PO TBCR: 40.0000 meq | EXTENDED_RELEASE_TABLET | ORAL | Status: DC

## 2017-12-23 MED ORDER — MAGNESIUM SULFATE IN D5W 1-5 GM/100ML-% IV SOLN
1.0000 g | Freq: Once | INTRAVENOUS | Status: AC
  Administered 2017-12-23: 1 g via INTRAVENOUS
  Filled 2017-12-23: qty 100

## 2017-12-23 MED ORDER — BUMETANIDE 2 MG PO TABS
4.0000 mg | ORAL_TABLET | Freq: Two times a day (BID) | ORAL | Status: DC
  Administered 2017-12-23 – 2017-12-28 (×10): 4 mg via ORAL
  Filled 2017-12-23 (×12): qty 2

## 2017-12-23 MED ORDER — SODIUM CHLORIDE 0.9 % IV SOLN
250.0000 mL | INTRAVENOUS | Status: DC | PRN
Start: 2017-12-23 — End: 2017-12-28

## 2017-12-23 MED ORDER — ONDANSETRON HCL 4 MG/2ML IJ SOLN: 4.0000 mg | Freq: Four times a day (QID) | INTRAMUSCULAR | Status: DC | PRN

## 2017-12-23 MED ORDER — ALPRAZOLAM 0.25 MG PO TABS: 0.2500 mg | ORAL_TABLET | Freq: Two times a day (BID) | ORAL | Status: DC | PRN

## 2017-12-23 MED ORDER — SODIUM CHLORIDE 0.9 % IV SOLN: 1.0000 mg/kg/h | Freq: Once | INTRAVENOUS | Status: DC

## 2017-12-23 MED ORDER — ZOLPIDEM TARTRATE 5 MG PO TABS: 5.0000 mg | ORAL_TABLET | Freq: Every evening | ORAL | Status: DC | PRN

## 2017-12-23 MED ORDER — SPIRONOLACTONE 25 MG PO TABS
25.0000 mg | ORAL_TABLET | Freq: Every day | ORAL | Status: DC
  Administered 2017-12-23 – 2017-12-28 (×5): 25 mg via ORAL
  Filled 2017-12-23 (×6): qty 1

## 2017-12-23 MED ORDER — ENOXAPARIN SODIUM 60 MG/0.6ML ~~LOC~~ SOLN
60.0000 mg | Freq: Every day | SUBCUTANEOUS | Status: DC
  Administered 2017-12-23 – 2017-12-24 (×2): 60 mg via SUBCUTANEOUS
  Filled 2017-12-23 (×3): qty 0.6

## 2017-12-23 MED ORDER — ENOXAPARIN SODIUM 40 MG/0.4ML ~~LOC~~ SOLN: 40.0000 mg | Freq: Every day | SUBCUTANEOUS | Status: DC

## 2017-12-23 MED ORDER — ORAL CARE MOUTH RINSE
15.0000 mL | Freq: Two times a day (BID) | OROMUCOSAL | Status: DC
  Administered 2017-12-23 – 2017-12-28 (×7): 15 mL via OROMUCOSAL

## 2017-12-23 MED ORDER — SODIUM CHLORIDE 0.9% FLUSH
3.0000 mL | Freq: Two times a day (BID) | INTRAVENOUS | Status: DC
  Administered 2017-12-23 – 2017-12-28 (×6): 3 mL via INTRAVENOUS

## 2017-12-23 MED ORDER — SODIUM CHLORIDE 0.9% FLUSH
3.0000 mL | INTRAVENOUS | Status: DC | PRN
Start: 2017-12-23 — End: 2017-12-28

## 2017-12-23 MED ORDER — BUMETANIDE 2 MG PO TABS
4.0000 mg | ORAL_TABLET | Freq: Every day | ORAL | Status: DC
  Administered 2017-12-23: 4 mg via ORAL
  Filled 2017-12-23 (×2): qty 2

## 2017-12-23 MED ORDER — METOLAZONE 5 MG PO TABS
2.5000 mg | ORAL_TABLET | Freq: Every day | ORAL | Status: DC
  Administered 2017-12-23 – 2017-12-27 (×4): 2.5 mg via ORAL
  Filled 2017-12-23 (×5): qty 1

## 2017-12-23 MED ORDER — POTASSIUM CHLORIDE CRYS ER 20 MEQ PO TBCR
60.0000 meq | EXTENDED_RELEASE_TABLET | Freq: Once | ORAL | Status: AC
  Administered 2017-12-23: 60 meq via ORAL
  Filled 2017-12-23: qty 3

## 2017-12-23 MED ORDER — ATORVASTATIN CALCIUM 20 MG PO TABS
20.0000 mg | ORAL_TABLET | Freq: Every day | ORAL | Status: DC
  Administered 2017-12-23 – 2017-12-28 (×6): 20 mg via ORAL
  Filled 2017-12-23 (×6): qty 1

## 2017-12-23 MED ORDER — INSULIN ASPART 100 UNIT/ML ~~LOC~~ SOLN
0.0000 [IU] | Freq: Three times a day (TID) | SUBCUTANEOUS | Status: DC
  Administered 2017-12-23: 3 [IU] via SUBCUTANEOUS
  Administered 2017-12-23: 5 [IU] via SUBCUTANEOUS
  Administered 2017-12-24 (×2): 3 [IU] via SUBCUTANEOUS
  Administered 2017-12-24: 5 [IU] via SUBCUTANEOUS
  Administered 2017-12-25 (×3): 3 [IU] via SUBCUTANEOUS
  Administered 2017-12-26: 2 [IU] via SUBCUTANEOUS
  Administered 2017-12-26: 8 [IU] via SUBCUTANEOUS
  Administered 2017-12-26: 3 [IU] via SUBCUTANEOUS
  Administered 2017-12-27: 5 [IU] via SUBCUTANEOUS
  Administered 2017-12-27: 3 [IU] via SUBCUTANEOUS
  Administered 2017-12-27: 5 [IU] via SUBCUTANEOUS
  Administered 2017-12-28: 2 [IU] via SUBCUTANEOUS
  Administered 2017-12-28: 8 [IU] via SUBCUTANEOUS
  Administered 2017-12-28: 3 [IU] via SUBCUTANEOUS

## 2017-12-23 MED ORDER — MAGNESIUM SULFATE 4 GM/100ML IV SOLN
4.0000 g | Freq: Once | INTRAVENOUS | Status: AC
  Administered 2017-12-23: 4 g via INTRAVENOUS
  Filled 2017-12-23: qty 100

## 2017-12-23 MED ORDER — INSULIN ASPART 100 UNIT/ML ~~LOC~~ SOLN
0.0000 [IU] | Freq: Every day | SUBCUTANEOUS | Status: DC
  Administered 2017-12-23 – 2017-12-27 (×4): 2 [IU] via SUBCUTANEOUS

## 2017-12-23 NOTE — Progress Notes (Addendum)
CRITICAL VALUE ALERT  Critical Value:  K 2.0. ,Ca 5.4  Date & Time Notied:  12/23/2017  Provider Notified: Haroldine Laws, MD  Orders Received/Actions taken: Order recieved.  Kennyth Lose, RN 12/23/2017 @20 :14

## 2017-12-23 NOTE — Consult Note (Signed)
   [facility] CM Inpatient Consult   12/23/2017  Nina Howell 06-22-1940 [id number]     Nina Howell is active with [place] [facility] program. She is followed by [facility] and [facility] LCSW. Please see chart review tab then encounters for patient outreach details.   Went to speak with Nina Howell. She is agreeable to ongoing [facility] services.   Inpatient (covering) RNCM already aware [name] is active.  Will continue to follow along during hospitalization and update [facility] team.   Marthenia Rolling, MSN-Ed, RN,BSN [facility] Liaison [phone]

## 2017-12-23 NOTE — ED Notes (Signed)
Report given to rn [email or web address] 

## 2017-12-23 NOTE — Care Management Note (Signed)
Case Management Note  Patient Details  Name: [name] MRN: [id number] Date of Birth: 1940-12-14  Subjective/Objective:                 CHF   Action/Plan:  Had a very pleasant conversation with patient at bedside. She informs me that she lives at home with her husband, she is a retired Education officer, museum in [place]. She has DME cane, RW, 3/1 and O2 from [facility] [employer]. Active w [facility] for PT RN SW. Has an aide 2 days a week.  She follows at the CHF clinic and weighs herself daily. She states she has some difficulty getting to appointments because she lives in a 2nd floor apartment and uses EMS to help her get up and down the steps, she states this is free service to her as a vested retiree.  She is active w [facility], and explains using telemedicine that follows her daily weights.   Expected Discharge Date:                  Expected Discharge Plan:  [place]  In-House Referral:     Discharge planning Services  CM Consult  Post Acute Care Choice:    Choice offered to:     DME Arranged:    DME Agency:     HH Arranged:    [place] Agency:     Status of Service:  In process, will continue to follow  If discussed at Long Length of Stay Meetings, dates discussed:    Additional Comments:  Carles Collet, RN 12/23/2017, 1:54 PM

## 2017-12-23 NOTE — Progress Notes (Addendum)
Progress Note  Patient Name: Nina Howell Date of Encounter: 12/23/2017  Primary Cardiologist: Dr. Haroldine Laws, MD   Subjective   Pt feeling somewhat better today. There is no output recorded for her this admission thus far>>>will need to adequately treat fluid volume overload.   Says SOB is improving. Still with significant LE edema. No orthopnea or PND   Inpatient Medications    Scheduled Meds: . aspirin EC  81 mg Oral Daily  . atorvastatin  20 mg Oral q1800  . bumetanide  4 mg Oral Daily  . enoxaparin (LOVENOX) injection  40 mg Subcutaneous Daily  . metolazone  2.5 mg Oral Daily  . potassium chloride  20 mEq Oral Daily  . sodium chloride flush  3 mL Intravenous Q12H  . spironolactone  25 mg Oral Daily   Continuous Infusions: . sodium chloride     PRN Meds: sodium chloride, ALPRAZolam, ondansetron (ZOFRAN) IV, sodium chloride flush, zolpidem   Vital Signs    Vitals:   12/23/17 0300 12/23/17 0315 12/23/17 0511 12/23/17 0616  BP: 101/87 107/81    Pulse: 88 88 85 89  Resp: (!) 24 15 (!) 26 20  Temp:   97.6 F (36.4 C)   TempSrc:   Oral   SpO2: 100% 93% 100% 100%  Weight:   294 lb 15.6 oz (133.8 kg)   Height:   5\' 1"  (1.549 m)    No intake or output data in the 24 hours ending 12/23/17 0948 Filed Weights   12/22/17 1750 12/23/17 0511  Weight: 291 lb (132 kg) 294 lb 15.6 oz (133.8 kg)    Physical Exam   General: Obese, NAD Skin: Warm, dry, intact  Head: Normocephalic, atraumatic, clear, moist mucus membranes. Anicteric  Neck: Negative for carotid bruits. JVP to jaw Lungs:Clear to ausculation bilaterally. No wheezes, rales, or rhonchi. Breathing is unlabored. Cardiovascular: RRR with S1 S2. 2/6 TR Abdomen: Obese Soft, non-tender, non-distended with normoactive bowel sounds.  No obvious abdominal masses. MSK: Strength and tone appear normal for age. 5/5 in all extremities Extremities: 2-3+ LE edema. No clubbing or cyanosis. DP/PT pulses 1+  bilaterally Neuro: alert & oriented x 3, cranial nerves grossly intact. moves all 4 extremities w/o difficulty. Affect pleasant   Labs    Chemistry Recent Labs  Lab 12/22/17 1817 12/22/17 1842 12/23/17 0233  NA 127* 131*  --   K 4.7 4.7  --   CL 88* 91*  --   CO2 26  --   --   GLUCOSE 219* 213*  --   BUN 32* 33*  --   CREATININE 1.42* 1.30* 1.29*  CALCIUM 8.8*  --   --   GFRNONAA 35*  --  39*  GFRAA 41*  --  46*  ANIONGAP 13  --   --      Hematology Recent Labs  Lab 12/22/17 1817 12/22/17 1842 12/23/17 0233  WBC 8.2  --  8.1  RBC 3.73*  --  3.68*  HGB 10.2* 12.2 10.0*  HCT 32.8* 36.0 32.3*  MCV 87.9  --  87.8  MCH 27.3  --  27.2  MCHC 31.1  --  31.0  RDW 17.1*  --  17.2*  PLT 207  --  192    Cardiac EnzymesNo results for input(s): TROPONINI in the last 168 hours.  Recent Labs  Lab 12/22/17 1840  TROPIPOC 0.00     BNP Recent Labs  Lab 12/22/17 1817  BNP 1,645.6*     DDimer  No results for input(s): DDIMER in the last 168 hours.   Radiology    Dg Chest 2 View  Result Date: 12/22/2017 CLINICAL DATA:  Shortness of breath EXAM: CHEST - 2 VIEW COMPARISON:  11/10/2017 FINDINGS: Lungs are clear.  No pleural effusion or pneumothorax. Stable severe cardiomegaly. Degenerative changes of the visualized thoracolumbar spine. IMPRESSION: No evidence of acute cardiopulmonary disease. Stable severe cardiomegaly. Electronically Signed   By: Julian Hy M.D.   On: 12/22/2017 18:51    Telemetry    12/23/17 NSR HR 82 - Personally Reviewed  ECG     12/22/17 NSR- Personally Reviewed  Cardiac Studies   Echocardiogram 10/19/2016: Study Conclusions  - Left ventricle: The cavity size was normal. There was mild   concentric hypertrophy. Doppler parameters are consistent with   abnormal left ventricular relaxation (grade 1 diastolic   dysfunction). - Left atrium: The atrium was dilated. - Right ventricle: Appears dilated with at least moderately reduced    systolic function.  Recommendations:  Alternative imaging methods are recommended. Even with Definity contrast the images are almost uninterpretable.   Echocardiogram 11/23/2015: Study Conclusions  - Left ventricle: The cavity size was normal. Wall thickness was   increased in a pattern of mild LVH. Systolic function was mildly   to moderately reduced. The estimated ejection fraction was in the   range of 40% to 45%. Mild diffuse hypokinesis with no   identifiable regional variations. Doppler parameters are   consistent with abnormal left ventricular relaxation (grade 1   diastolic dysfunction). Acoustic contrast opacification revealed   no evidence ofthrombus. - Right ventricle: The cavity size was mildly dilated. Wall   thickness was normal. Systolic function was mildly reduced. - Right atrium: The atrium was mildly dilated.  Patient Profile     77 y.o. female with a history of combined systolic and diastolic CHF, obstructive sleep apnea, morbid obesity, diabetes mellitus, COPD and asthma who presents with complaints of recent weight gain and worsening lower extremity edema. She has had several admissions for volume overload and decompensated heart failure. During some of these hospitalizations she was diuresed with bumetanide and transitioned to torsemide on discharge to home.  Assessment & Plan    1. Acute on chronic combined systolic and diastolic heart failure: -Patient admitted with reported 10lb weight gain in approximately 2 day time period> was initially evaluated by advanced home care RN who instructed her to proceed to ED (baseline weight 281>>>291) -BNP on admission > 16000 -Patient currently on OP torsemide and PRN metolazone secondary to noted allergy to Lasix -Continue spironolactone 25 mg daily along with metolazone 2.5 mg daily for adequate diuresis -Started on Bumex 4mg   -K+ 4.7 on 12/22/2016, magnesium+ 1.1>>>will need mag+ supplementation with 2g to start with and  recheck BMET in AM  -Weight, 294lb today, noted to be 291lb on admission -I&O, no output currently noted -Continue daily weights, strict I&O  2. DM2: -Will place on SSI for glucose control while inpatient  -CBG AC/HS -HbA1c, 7.5 2011  3. Acute on chronic kidney disease, stage II: -Improving, creatinine 1.29 today, was 1.42 on admission.  -Baseline appears to be in the 1.0 range  -Avoid nephrotoxic medications   4. Morbid obesity with hypoventilation: -Currently on 2 L supplemental oxygen>>>home 4L  -Encouraged compliance with BiPAP ventilation for OSA and weight loss and increase activity in the OP setting for greater symptom control   5. Hx of COPD:  -Stable, on home inhalers  -Followed by pulmonology  6. 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 Haroldine Laws -May ned R/L cath to definitively define pressures at some point per chart review   Signed, Kathyrn Drown NP-C [place] Pager: [phone] 12/23/2017, 9:48 AM     For questions or updates, please contact   Please consult [email or web address] for contact info under Cardiology/STEMI.   Patient seen and examined with the above-signed Advanced Practice Provider and/or Housestaff. I personally reviewed laboratory data, imaging studies and relevant notes. I independently examined the patient and formulated the important aspects of the plan. I have edited the note to reflect any of my changes or salient points. I have personally discussed the plan with the patient and/or family.  77 y/o woman with morbid obesity and systolic/diastolic HF with R>L HF. EF 40-45% ragne. Previously has refused cardiac cath. Now admitted with recurrent HF symptoms. Weight up at least 12 pounds. Responding to increased po diuretics with metolazone (unable to tolerate IV lasix due to rash).   I suspect major issue here is RV failure and PAH.  Will continue to diurese. Wrap legs with  UNNA boots. Repeat echo. Will need R +/- left heart cath this admit.    Glori Bickers, MD  6:36 PM

## 2017-12-24 ENCOUNTER — Other Ambulatory Visit: Payer: Self-pay | Admitting: Licensed Clinical Social Worker

## 2017-12-24 ENCOUNTER — Inpatient Hospital Stay (HOSPITAL_COMMUNITY): Payer: BLUE CROSS/BLUE SHIELD

## 2017-12-24 DIAGNOSIS — I351 Nonrheumatic aortic (valve) insufficiency: Secondary | ICD-10-CM

## 2017-12-24 DIAGNOSIS — I340 Nonrheumatic mitral (valve) insufficiency: Secondary | ICD-10-CM

## 2017-12-24 LAB — BASIC METABOLIC PANEL
Anion gap: 13 (ref 5–15)
BUN: 25 mg/dL — AB (ref 8–23)
CALCIUM: 9 mg/dL (ref 8.9–10.3)
CO2: 30 mmol/L (ref 22–32)
CREATININE: 1.09 mg/dL — AB (ref 0.44–1.00)
Chloride: 94 mmol/L — ABNORMAL LOW (ref 98–111)
GFR calc non Af Amer: 48 mL/min — ABNORMAL LOW (ref >60)
GFR, EST AFRICAN AMERICAN: 56 mL/min — AB (ref >60)
Glucose, Bld: 167 mg/dL — ABNORMAL HIGH (ref 70–99)
Potassium: 3.5 mmol/L (ref 3.5–5.1)
SODIUM: 137 mmol/L (ref 135–145)

## 2017-12-24 LAB — GLUCOSE, CAPILLARY
GLUCOSE-CAPILLARY: 157 mg/dL — AB (ref 70–99)
GLUCOSE-CAPILLARY: 179 mg/dL — AB (ref 70–99)
GLUCOSE-CAPILLARY: 184 mg/dL — AB (ref 70–99)
Glucose-Capillary: 209 mg/dL — ABNORMAL HIGH (ref 70–99)

## 2017-12-24 LAB — ECHOCARDIOGRAM COMPLETE
HEIGHTINCHES: 61 in
WEIGHTICAEL: 4585.57 [oz_av]

## 2017-12-24 LAB — MAGNESIUM: MAGNESIUM: 1.9 mg/dL (ref 1.7–2.4)

## 2017-12-24 MED ORDER — ASPIRIN 81 MG PO CHEW
81.0000 mg | CHEWABLE_TABLET | ORAL | Status: AC
  Administered 2017-12-25: 81 mg via ORAL
  Filled 2017-12-24: qty 1

## 2017-12-24 MED ORDER — MAGNESIUM SULFATE 2 GM/50ML IV SOLN
2.0000 g | Freq: Once | INTRAVENOUS | Status: AC
  Administered 2017-12-24: 2 g via INTRAVENOUS
  Filled 2017-12-24: qty 50

## 2017-12-24 MED ORDER — POTASSIUM CHLORIDE CRYS ER 20 MEQ PO TBCR
40.0000 meq | EXTENDED_RELEASE_TABLET | Freq: Once | ORAL | Status: AC
  Administered 2017-12-24: 40 meq via ORAL
  Filled 2017-12-24: qty 2

## 2017-12-24 MED ORDER — SODIUM CHLORIDE 0.9 % IV SOLN
INTRAVENOUS | Status: DC
  Administered 2017-12-25: 07:00:00 via INTRAVENOUS

## 2017-12-24 MED ORDER — POTASSIUM CHLORIDE CRYS ER 20 MEQ PO TBCR: 40.0000 meq | EXTENDED_RELEASE_TABLET | Freq: Once | ORAL | Status: DC

## 2017-12-24 MED ORDER — DOCUSATE SODIUM 100 MG PO CAPS
100.0000 mg | ORAL_CAPSULE | Freq: Two times a day (BID) | ORAL | Status: DC | PRN
  Administered 2017-12-24: 100 mg via ORAL
  Filled 2017-12-24: qty 1

## 2017-12-24 MED ORDER — SENNA 8.6 MG PO TABS
2.0000 | ORAL_TABLET | Freq: Every day | ORAL | Status: DC | PRN
  Administered 2017-12-24: 17.2 mg via ORAL
  Filled 2017-12-24: qty 2

## 2017-12-24 NOTE — Progress Notes (Signed)
Orthopedic Tech Progress Note Patient Details:  Nina Howell 1940/08/06 [id number]  Ortho Devices Ortho Device/Splint Location: Bilateral unna boots Ortho Device/Splint Interventions: Application   Post Interventions Patient Tolerated: Well Instructions Provided: Care of device   [place] Pink 12/24/2017, 3:04 PM

## 2017-12-24 NOTE — Evaluation (Signed)
Physical Therapy Evaluation Patient Details Name: Nina Howell MRN: [id number] DOB: Feb 09, 1941 Today's Date: 12/24/2017   History of Present Illness  77 y.o. female with a history of combined systolic and diastolic CHF, obstructive sleep apnea, morbid obesity, diabetes mellitus, COPD and asthma who presents with complaints of recent weight gain and worsening lower extremity edema.  Clinical Impression  Pt presents with deficits in gait, balance and mobility and will benefit from skilled PT to address deficits and improve functional independence. Pt participated in sit <> stand without AD with min guard, sidestepping without AD with min A. Pt mod A for bed mobility due to LE and trunk weakness.    Follow Up Recommendations Home health PT    Equipment Recommendations  Other (comment)(may need RW)    Recommendations for Other Services       Precautions / Restrictions Precautions Precautions: Fall Restrictions Weight Bearing Restrictions: No RLE Weight Bearing: Non weight bearing LLE Weight Bearing: Non weight bearing      Mobility  Bed Mobility Overal bed mobility: Needs Assistance Bed Mobility: Supine to Sit;Sit to Supine     Supine to sit: Mod assist Sit to supine: Mod assist   General bed mobility comments: assist for bilat LEs  Transfers Overall transfer level: Needs assistance Equipment used: Rolling walker (2 wheeled) Transfers: Sit to/from Stand Sit to Stand: Min guard         General transfer comment: cues for UE placement  Ambulation/Gait Ambulation/Gait assistance: Min guard Gait Distance (Feet): 5 Feet Assistive device: Rolling walker (2 wheeled)       General Gait Details: pt able to take side steps edge of bed with min guard, forward flexed posture  Stairs            Wheelchair Mobility    Modified Rankin (Stroke Patients Only)       Balance Overall balance assessment: Needs assistance           Standing balance-Leahy  Scale: Fair Standing balance comment: requires bilat UEs on RW for standing balance                             Pertinent Vitals/Pain Pain Assessment: No/denies pain    Home Living Family/patient expects to be discharged to:: Private residence Living Arrangements: Spouse/significant other Available Help at Discharge: Family;Personal care attendant;Available PRN/intermittently;Home health Type of Home: Apartment Home Access: Stairs to enter Entrance Stairs-Rails: Right Entrance Stairs-Number of Steps: flight Home Layout: One level Home Equipment: Walker - 4 wheels;Cane - single point;Bedside commode Additional Comments: pt reports she uses SPC for mobility. lives on the 2nd floor    Prior Function Level of Independence: Independent with assistive device(s)         Comments: typically mobilizes around home with [facility], reports she has an aide for bathing     Hand Dominance        Extremity/Trunk Assessment   Upper Extremity Assessment Upper Extremity Assessment: Generalized weakness    Lower Extremity Assessment Lower Extremity Assessment: Generalized weakness(edema bilat LE)    Cervical / Trunk Assessment Cervical / Trunk Assessment: Kyphotic  Communication   Communication: No difficulties  Cognition Arousal/Alertness: Awake/alert Behavior During Therapy: WFL for tasks assessed/performed Overall Cognitive Status: Within Functional Limits for tasks assessed  General Comments      Exercises     Assessment/Plan    PT Assessment Patient needs continued PT services  PT Problem List Decreased strength;Decreased activity tolerance;Decreased balance;Decreased knowledge of use of DME;Cardiopulmonary status limiting activity       PT Treatment Interventions Therapeutic activities;DME instruction;Modalities;Gait training;Therapeutic exercise;Patient/family education;Balance training;Stair  training;Functional mobility training;Neuromuscular re-education    PT Goals (Current goals can be found in the Care Plan section)  Acute Rehab PT Goals Patient Stated Goal: be able to go up/down the stairs PT Goal Formulation: With patient Time For Goal Achievement: 01/07/18 Potential to Achieve Goals: Good    Frequency Min 3X/week   Barriers to discharge Inaccessible home environment      Co-evaluation               AM-PAC PT "6 Clicks" Daily Activity  Outcome Measure Difficulty turning over in bed (including adjusting bedclothes, sheets and blankets)?: A Lot Difficulty moving from lying on back to sitting on the side of the bed? : A Lot Difficulty sitting down on and standing up from a chair with arms (e.g., wheelchair, bedside commode, etc,.)?: A Little Help needed moving to and from a bed to chair (including a wheelchair)?: A Little Help needed walking in hospital room?: A Little Help needed climbing 3-5 steps with a railing? : Total 6 Click Score: 14    End of Session Equipment Utilized During Treatment: Oxygen Activity Tolerance: Patient tolerated treatment well Patient left: in bed;with call bell/phone within reach;with nursing/sitter in room Nurse Communication: Mobility status PT Visit Diagnosis: Difficulty in walking, not elsewhere classified (R26.2);Muscle weakness (generalized) (M62.81)    Time: 9935-7017 PT Time Calculation (min) (ACUTE ONLY): 24 min   Charges:   PT Evaluation $PT Eval Moderate Complexity: 1 Mod PT Treatments $Therapeutic Activity: 8-22 mins       Nina Howell, PT, DPT  Nina Howell 12/24/2017, 11:41 AM

## 2017-12-24 NOTE — Progress Notes (Signed)
  Echocardiogram 2D Echocardiogram has been performed.  Nina Howell 12/24/2017, 3:41 PM

## 2017-12-24 NOTE — Patient Outreach (Signed)
[facility]) Care Management  12/24/2017  Dilara Navarrete [birth year] [id number]  [facility] CSW is aware of patient's current hospitalization. [facility] CSW has already completed referral to [employer] in order to assist patient with gaining hand rails in her bathroom. [facility] CSW contacted [facility] and discussed case. Alita Chyle reports that she contacted patient's residence but was unable to reach her and left a voice message and is just waiting to hear back from her in order to gain eligibility information. [facility] reports that when she pulled patient's address, it stated the house was in [place] name and Vaughan Basta would need a plausible reason for this from patient in order for her to be eligible for their program. [facility] CSW will follow up with patient post hospital discharge and encourage her to return call to [employer].  Eula Fried, BSW, MSW, [email or web address] Phone: [phone] Fax: [phone]

## 2017-12-24 NOTE — Progress Notes (Addendum)
Inpatient Diabetes Program Recommendations  AACE/ADA: New Consensus Statement on Inpatient Glycemic Control (2015)  Target Ranges:  Prepandial:   less than 140 mg/dL      Peak postprandial:   less than 180 mg/dL (1-2 hours)      Critically ill patients:  140 - 180 mg/dL   Lab Results  Component Value Date   GLUCAP 209 (H) 12/24/2017   HGBA1C 11.0 (H) 12/22/2017    Review of Glycemic Control Results for DARRIA, CORVERA (MRN [id number]) as of 12/24/2017 13:41  Ref. Range 12/23/2017 16:37 12/23/2017 22:29 12/24/2017 06:25 12/24/2017 11:31  Glucose-Capillary Latest Ref Range: 70 - 99 mg/dL 192 (H) 234 (H) 157 (H) 209 (H)   Diabetes history: Type 2 DM Outpatient Diabetes medications: Novolin 70/30 26 units in AM, 16 units QPM Current orders for Inpatient glycemic control: Novolog 0-15 units TID, Novolog 0-5 units QHS  Inpatient Diabetes Program Recommendations:    Spoke with patient regarding outpatient diabetes management. Patient sees Dr Buddy Duty for DM managment and is aware of increased BS and A1C. She verified her home insulin and reports taking as prescribed.  Reviewed patient's current A1c of 11%. Explained what a A1c is and what it measures. Also reviewed goal A1c with patient, importance of good glucose control @ home, and blood sugar goals. Patient has been checking BS 3-4 times per day and takes her meter with her to visits.  Admits to drinking 1 juice per day. Encouraged patient to look at the total carbohydrates and attempt to make modifications using more water, crystal light or diet beverages. Explained the benefits of making these modifications and the impact they could have on BS. Patient states, " I have been having trouble with blood sugars here lately and Dr Buddy Duty recently made adjustments." Patient plans to follow up with PCP and Dr Buddy Duty. Will place consult for RD. Patient has no further questions regarding DM at this time.  Thanks, Bronson Curb, MSN, RNC-OB Diabetes  Coordinator [phone] (8a-5p)

## 2017-12-24 NOTE — Progress Notes (Addendum)
  Advanced Heart Failure Rounding Note  PCP-Cardiologist: Dalton McLean, MD   Subjective:    Started on bumex 4 mg BID yesterday + metolazone 2.5 mg daily. Brisk UOP with 2.4 L out. Creatinine stable 1.09, K 3.5, Mag 1.9. Weight is down 8 lbs (bed weight).   Labs inaccurate yesterday. Labs improved today.   Denies CP, SOB, orthopnea. Swelling is much better. She says she is unable to stand for standing weight.  Objective:   Weight Range: 286 lb 9.6 oz (130 kg) Body mass index is 54.15 kg/m.   Vital Signs:   Temp:  [97.3 F (36.3 C)-98.8 F (37.1 C)] 97.9 F (36.6 C) (08/06 0425) Pulse Rate:  [81-96] 81 (08/06 0600) Resp:  [17-26] 17 (08/06 0600) BP: (101-113)/(59-85) 101/75 (08/06 0425) SpO2:  [88 %-100 %] 88 % (08/06 0600) Arterial Line BP: (96-109)/(56-63) 98/58 (08/06 0600) Weight:  [286 lb 9.6 oz (130 kg)] 286 lb 9.6 oz (130 kg) (08/06 0028) Last BM Date: 12/23/17  Weight change: Filed Weights   12/22/17 1750 12/23/17 0511 12/24/17 0028  Weight: 291 lb (132 kg) 294 lb 15.6 oz (133.8 kg) 286 lb 9.6 oz (130 kg)    Intake/Output:   Intake/Output Summary (Last 24 hours) at 12/24/2017 0749 Last data filed at 12/24/2017 0028 Gross per 24 hour  Intake 553.27 ml  Output 2900 ml  Net -2346.73 ml      Physical Exam    General:  No resp difficulty HEENT: Normal anicteric Neck: Supple. JVP 8-10. Carotids 2+ bilat; no bruits. No lymphadenopathy or thyromegaly appreciated. Cor: PMI nondisplaced. Regular rate & rhythm. No rubs, gallops or murmurs. Lungs: diminished throughout. On 2 L O2 no wheeze,  Abdomen: obese, soft, nontender, nondistended. No hepatosplenomegaly. No bruits or masses. Good bowel sounds. Extremities: No cyanosis, clubbing, rash, 1-2+ edema. L radial arterial line  Neuro: alert & oriented x 3, cranial nerves grossly intact. moves all 4 extremities w/o difficulty. Affect pleasant   Telemetry   NSR 1st degree AV block 80s. Personally reviewed.    EKG    No new tracings.   Labs    CBC Recent Labs    12/22/17 1817 12/22/17 1842 12/23/17 0233  WBC 8.2  --  8.1  NEUTROABS 5.8  --   --   HGB 10.2* 12.2 10.0*  HCT 32.8* 36.0 32.3*  MCV 87.9  --  87.8  PLT 207  --  192   Basic Metabolic Panel Recent Labs    12/23/17 0233  12/23/17 2220 12/24/17 0436  NA  --    < > 134* 137  K  --    < > 3.7 3.5  CL  --    < > 92* 94*  CO2  --    < > 29 30  GLUCOSE  --    < > 230* 167*  BUN  --    < > 26* 25*  CREATININE 1.29*   < > 1.16* 1.09*  CALCIUM  --    < > 8.9 9.0  MG 1.1*  --   --  1.9   < > = values in this interval not displayed.   Liver Function Tests No results for input(s): AST, ALT, ALKPHOS, BILITOT, PROT, ALBUMIN in the last 72 hours. No results for input(s): LIPASE, AMYLASE in the last 72 hours. Cardiac Enzymes No results for input(s): CKTOTAL, CKMB, CKMBINDEX, TROPONINI in the last 72 hours.  BNP: BNP (last 3 results) Recent Labs    11/10/17 2302   12/16/17 0943 12/22/17 1817  BNP 2,661.5* 1,895.1* 1,645.6*    ProBNP (last 3 results) No results for input(s): PROBNP in the last 8760 hours.   D-Dimer No results for input(s): DDIMER in the last 72 hours. Hemoglobin A1C Recent Labs    12/22/17 1817  HGBA1C 11.0*   Fasting Lipid Panel No results for input(s): CHOL, HDL, LDLCALC, TRIG, CHOLHDL, LDLDIRECT in the last 72 hours. Thyroid Function Tests No results for input(s): TSH, T4TOTAL, T3FREE, THYROIDAB in the last 72 hours.  Invalid input(s): FREET3  Other results:   Imaging     No results found.   Medications:     Scheduled Medications: . aspirin EC  81 mg Oral QHS  . atorvastatin  20 mg Oral q1800  . bumetanide  4 mg Oral BID  . enoxaparin (LOVENOX) injection  60 mg Subcutaneous Daily  . insulin aspart  0-15 Units Subcutaneous TID WC  . insulin aspart  0-5 Units Subcutaneous QHS  . mouth rinse  15 mL Mouth Rinse BID  . metolazone  2.5 mg Oral Daily  . potassium chloride  20  mEq Oral Daily  . sodium chloride flush  3 mL Intravenous Q12H  . spironolactone  25 mg Oral Daily     Infusions: . sodium chloride       PRN Medications:  sodium chloride, ALPRAZolam, ondansetron (ZOFRAN) IV, sodium chloride flush, zolpidem    Patient Profile   Nina Howell is a 77 y.o. female with a history of combined systolic and diastolic CHF, obstructive sleep apnea, morbid obesity, diabetes mellitus, COPD and asthma.  Admitted with weight gain and BLE edema.   Assessment/Plan    1. Acute on chronic diastolic HF - Echo 08/22/30 LVEF 50-55%, Grade 1 DD, RV severely dilated and severely. Echo improved in 7/17 EF ~45% with septal bounce. RV dilated with mild to moderate dysfunction. PA pressures < 40. Effsuion resolved. Echo 10/2016: EF 55-60% - Volume status improving - Continue bumex 4 mg BID + metolazone 2.5 mg daily - Continue spiro 25 mg daily - Add unna boots (ordered, not yet put on) - Followed by HF paramedicine at home - Will likely need R/LHC this admission. Maybe tomorrow. She is agreeable.  - Repeat echo pending.  2. 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  - PA pressures much improved on previous echo after diuresis. Repeat echo today.  - Will likely need R +/- LHC this admission to clearly define pressures. She is agreeable.  - VQ scan was indeterminate but Chest CT not suggestive of chronic PE, LE dopplers negative.  - ANA 1:80 Positive, RF, P-ANCA negative,  ESR 35. Likely not clinically significant.  - Follows with Dr. Elsworth Soho outpatient - Continue home O2 3. Morbid obesity - Body mass index is 54.15 kg/m. 4. Abnormal ECG with inferior Q waves - No s/s of ischemia.    - Has previously refused cath, but now agreeable.  6. Obesity hypoventilation syndrome - Continue home oxygen. 7. OSA on BiPAP - Will order BiPAP qHS.  8. Bullous pemphigoid - Followed by Dermatology.   No recent steroids. 9. Deconditioning - PT/OT eval 10. Hypokalemia - Will increase supp. Discussed with pharmD. Cont spiro 25 mg daily  Can probably remove a-line. Cuff pressures seem to correlate.  Length of Stay: [place], NP  12/24/2017, 7:49 AM  Advanced Heart Failure Team Pager [phone] (M-F; 7a - 4p)  Please contact [facility] for night-coverage after hours (4p -7a ) and weekends on [email or web address]  Patient seen and examined with the above-signed Advanced Practice Provider and/or Housestaff. I personally reviewed laboratory data, imaging studies and relevant notes. I independently examined the patient and formulated the important aspects of the plan. I have edited the note to reflect any of my changes or salient points. I have personally discussed the plan with the patient and/or family.  She is diuresing well on high-dose oral diuretics. (Unable to tolerate IV lasix due to rash). Remains volume overloaded. Suspect main issue is PAH. Repeating echo today. Will arrange for R/L cath tomorrow to further sort out as well. Supp electrolytes. Will remove a-line. Says she cannot walk or stand. PT/OT to see.   Daniel Bensimhon, MD  9:52 AM   

## 2017-12-24 NOTE — Progress Notes (Signed)
Repeated lab Basic Metabolic Panel,K 5.0,KX 8.9. Consulted with pharmacist, Mr Marya Amsler, recommended to hold KCL 20 mEq x 3 doses and Calcium gluconate for now. Will continue to monitor.   Kennyth Lose, RN 12/24/2017 @ 00:28

## 2017-12-25 ENCOUNTER — Encounter (HOSPITAL_COMMUNITY)
Admission: EM | Disposition: A | Discharge status: Home Health | Payer: Self-pay | Source: Home / Self Care | Attending: Internal Medicine

## 2017-12-25 ENCOUNTER — Encounter (HOSPITAL_COMMUNITY): Payer: Self-pay | Admitting: Internal Medicine

## 2017-12-25 HISTORY — PX: RIGHT/LEFT HEART CATH AND CORONARY ANGIOGRAPHY: CATH118266

## 2017-12-25 LAB — PROTIME-INR
INR: 1.12
PROTHROMBIN TIME: 14.3 s (ref 11.4–15.2)

## 2017-12-25 LAB — POCT I-STAT 3, VENOUS BLOOD GAS (G3P V)
Acid-Base Excess: 7 mmol/L — ABNORMAL HIGH (ref 0.0–2.0)
Acid-Base Excess: 9 mmol/L — ABNORMAL HIGH (ref 0.0–2.0)
BICARBONATE: 34.3 mmol/L — AB (ref 20.0–28.0)
Bicarbonate: 36 mmol/L — ABNORMAL HIGH (ref 20.0–28.0)
O2 SAT: 34 %
O2 SAT: 35 %
PCO2 VEN: 61.4 mmHg — AB (ref 44.0–60.0)
PCO2 VEN: 61.6 mmHg — AB (ref 44.0–60.0)
PH VEN: 7.354 (ref 7.250–7.430)
PO2 VEN: 22 mmHg — AB (ref 32.0–45.0)
PO2 VEN: 23 mmHg — AB (ref 32.0–45.0)
TCO2: 38 mmol/L — AB (ref 22–32)
TCO2: 36 mmol/L — ABNORMAL HIGH (ref 22–32)
pH, Ven: 7.376 (ref 7.250–7.430)

## 2017-12-25 LAB — BASIC METABOLIC PANEL
ANION GAP: 13 (ref 5–15)
BUN: 22 mg/dL (ref 8–23)
CALCIUM: 9.2 mg/dL (ref 8.9–10.3)
CO2: 33 mmol/L — AB (ref 22–32)
CREATININE: 1.06 mg/dL — AB (ref 0.44–1.00)
Chloride: 92 mmol/L — ABNORMAL LOW (ref 98–111)
GFR calc Af Amer: 58 mL/min — ABNORMAL LOW (ref >60)
GFR, EST NON AFRICAN AMERICAN: 50 mL/min — AB (ref >60)
Glucose, Bld: 166 mg/dL — ABNORMAL HIGH (ref 70–99)
Potassium: 2.9 mmol/L — ABNORMAL LOW (ref 3.5–5.1)
Sodium: 138 mmol/L (ref 135–145)

## 2017-12-25 LAB — CBC
HCT: 32.5 % — ABNORMAL LOW (ref 36.0–46.0)
HEMOGLOBIN: 10 g/dL — AB (ref 12.0–15.0)
MCH: 26.7 pg (ref 26.0–34.0)
MCHC: 30.8 g/dL (ref 30.0–36.0)
MCV: 86.9 fL (ref 78.0–100.0)
PLATELETS: 213 10*3/uL (ref 150–400)
RBC: 3.74 MIL/uL — ABNORMAL LOW (ref 3.87–5.11)
RDW: 17.4 % — ABNORMAL HIGH (ref 11.5–15.5)
WBC: 6.9 10*3/uL (ref 4.0–10.5)

## 2017-12-25 LAB — GLUCOSE, CAPILLARY
GLUCOSE-CAPILLARY: 153 mg/dL — AB (ref 70–99)
GLUCOSE-CAPILLARY: 156 mg/dL — AB (ref 70–99)
Glucose-Capillary: 174 mg/dL — ABNORMAL HIGH (ref 70–99)
Glucose-Capillary: 204 mg/dL — ABNORMAL HIGH (ref 70–99)

## 2017-12-25 LAB — POCT I-STAT 3, ART BLOOD GAS (G3+)
Acid-Base Excess: 8 mmol/L — ABNORMAL HIGH (ref 0.0–2.0)
BICARBONATE: 34.3 mmol/L — AB (ref 20.0–28.0)
O2 Saturation: 99 %
PCO2 ART: 52.4 mmHg — AB (ref 32.0–48.0)
TCO2: 36 mmol/L — AB (ref 22–32)
pH, Arterial: 7.424 (ref 7.350–7.450)
pO2, Arterial: 123 mmHg — ABNORMAL HIGH (ref 83.0–108.0)

## 2017-12-25 SURGERY — RIGHT/LEFT HEART CATH AND CORONARY ANGIOGRAPHY
Anesthesia: LOCAL

## 2017-12-25 MED ORDER — LIDOCAINE HCL (PF) 1 % IJ SOLN
INTRAMUSCULAR | Status: AC
  Filled 2017-12-25: qty 30

## 2017-12-25 MED ORDER — SODIUM CHLORIDE 0.9% FLUSH: 3.0000 mL | INTRAVENOUS | Status: DC | PRN

## 2017-12-25 MED ORDER — HEPARIN (PORCINE) IN NACL 1000-0.9 UT/500ML-% IV SOLN
INTRAVENOUS | Status: DC | PRN
  Administered 2017-12-25 (×2): 500 mL

## 2017-12-25 MED ORDER — VERAPAMIL HCL 2.5 MG/ML IV SOLN
INTRAVENOUS | Status: DC | PRN
  Administered 2017-12-25: 10 mL via INTRA_ARTERIAL

## 2017-12-25 MED ORDER — POTASSIUM CHLORIDE 20 MEQ/15ML (10%) PO SOLN
ORAL | Status: AC
  Filled 2017-12-25: qty 45

## 2017-12-25 MED ORDER — FENTANYL CITRATE (PF) 100 MCG/2ML IJ SOLN
INTRAMUSCULAR | Status: DC | PRN
  Administered 2017-12-25 (×2): 25 ug via INTRAVENOUS

## 2017-12-25 MED ORDER — ACETAMINOPHEN 325 MG PO TABS
650.0000 mg | ORAL_TABLET | ORAL | Status: DC | PRN
  Administered 2017-12-26: 650 mg via ORAL
  Filled 2017-12-25: qty 2

## 2017-12-25 MED ORDER — SODIUM CHLORIDE 0.9% FLUSH
3.0000 mL | Freq: Two times a day (BID) | INTRAVENOUS | Status: DC
  Administered 2017-12-25 – 2017-12-27 (×4): 3 mL via INTRAVENOUS

## 2017-12-25 MED ORDER — POTASSIUM CHLORIDE CRYS ER 20 MEQ PO TBCR: 40.0000 meq | EXTENDED_RELEASE_TABLET | Freq: Once | ORAL | Status: DC

## 2017-12-25 MED ORDER — HEPARIN SODIUM (PORCINE) 1000 UNIT/ML IJ SOLN
INTRAMUSCULAR | Status: DC | PRN
  Administered 2017-12-25: 4000 [IU] via INTRAVENOUS

## 2017-12-25 MED ORDER — MIDAZOLAM HCL 2 MG/2ML IJ SOLN
INTRAMUSCULAR | Status: DC | PRN
  Administered 2017-12-25: 0.5 mg via INTRAVENOUS
  Administered 2017-12-25: 1 mg via INTRAVENOUS

## 2017-12-25 MED ORDER — ENOXAPARIN SODIUM 40 MG/0.4ML ~~LOC~~ SOLN
40.0000 mg | SUBCUTANEOUS | Status: DC
  Administered 2017-12-26 – 2017-12-28 (×3): 40 mg via SUBCUTANEOUS
  Filled 2017-12-25 (×3): qty 0.4

## 2017-12-25 MED ORDER — SODIUM CHLORIDE 0.9 % IV SOLN: 250.0000 mL | INTRAVENOUS | Status: DC | PRN

## 2017-12-25 MED ORDER — VERAPAMIL HCL 2.5 MG/ML IV SOLN
INTRAVENOUS | Status: AC
  Filled 2017-12-25: qty 2

## 2017-12-25 MED ORDER — HEPARIN (PORCINE) IN NACL 1000-0.9 UT/500ML-% IV SOLN
INTRAVENOUS | Status: AC
  Filled 2017-12-25: qty 1000

## 2017-12-25 MED ORDER — SODIUM CHLORIDE 0.9 % IV SOLN
INTRAVENOUS | Status: AC | PRN
  Administered 2017-12-25: 3 mL via INTRAVENOUS
  Administered 2017-12-25: 10 mL/h via INTRAVENOUS

## 2017-12-25 MED ORDER — LIDOCAINE HCL (PF) 1 % IJ SOLN
INTRAMUSCULAR | Status: DC | PRN
  Administered 2017-12-25: 2 mL via SUBCUTANEOUS
  Administered 2017-12-25: 5 mL via SUBCUTANEOUS
  Administered 2017-12-25: 2 mL via SUBCUTANEOUS

## 2017-12-25 MED ORDER — MIDAZOLAM HCL 2 MG/2ML IJ SOLN
INTRAMUSCULAR | Status: AC
  Filled 2017-12-25: qty 2

## 2017-12-25 MED ORDER — SODIUM CHLORIDE 0.9 % IV SOLN
INTRAVENOUS | Status: AC
  Administered 2017-12-25: 14:00:00 via INTRAVENOUS

## 2017-12-25 MED ORDER — HEPARIN SODIUM (PORCINE) 1000 UNIT/ML IJ SOLN
INTRAMUSCULAR | Status: AC
  Filled 2017-12-25: qty 1

## 2017-12-25 MED ORDER — POTASSIUM CHLORIDE CRYS ER 20 MEQ PO TBCR
40.0000 meq | EXTENDED_RELEASE_TABLET | ORAL | Status: AC
  Administered 2017-12-25 (×2): 40 meq via ORAL
  Filled 2017-12-25 (×2): qty 2

## 2017-12-25 MED ORDER — POTASSIUM CHLORIDE 20 MEQ/15ML (10%) PO SOLN
60.0000 meq | Freq: Once | ORAL | Status: AC
  Administered 2017-12-25: 60 meq via ORAL

## 2017-12-25 MED ORDER — ONDANSETRON HCL 4 MG/2ML IJ SOLN: 4.0000 mg | Freq: Four times a day (QID) | INTRAMUSCULAR | Status: DC | PRN

## 2017-12-25 MED ORDER — IOHEXOL 350 MG/ML SOLN
INTRAVENOUS | Status: DC | PRN
  Administered 2017-12-25: 85 mL via INTRA_ARTERIAL

## 2017-12-25 MED ORDER — FENTANYL CITRATE (PF) 100 MCG/2ML IJ SOLN
INTRAMUSCULAR | Status: AC
  Filled 2017-12-25: qty 2

## 2017-12-25 SURGICAL SUPPLY — 25 items
BRACE RADIAL COMPRESSION RADST (HEMOSTASIS) ×1 IMPLANT
CATH 5FR JL3.5 JR4 ANG PIG MP (CATHETERS) ×2 IMPLANT
CATH INFINITI 5FR AL1 (CATHETERS) ×1 IMPLANT
CATH LAUNCHER 5F JL3 (CATHETERS) IMPLANT
CATH SWAN GANZ 7F STRAIGHT (CATHETERS) ×1 IMPLANT
CATH-GARD ARROW CATH SHIELD (MISCELLANEOUS) ×2
CATHETER LAUNCHER 5F JL3 (CATHETERS) ×2
DEVICE RAD COMP TR BAND LRG (VASCULAR PRODUCTS) ×1 IMPLANT
DRSG SORBAVIEW 3.5X5-5/16 MED (GAUZE/BANDAGES/DRESSINGS) ×1 IMPLANT
GLIDESHEATH SLEND SS 6F .021 (SHEATH) ×2 IMPLANT
GUIDEWIRE INQWIRE 1.5J.035X260 (WIRE) ×1 IMPLANT
HOVERMATT SINGLE USE (MISCELLANEOUS) ×2 IMPLANT
INQWIRE 1.5J .035X260CM (WIRE) ×2
KIT HEART LEFT (KITS) ×2 IMPLANT
KIT MICROPUNCTURE NIT STIFF (SHEATH) ×4 IMPLANT
PACK CARDIAC CATHETERIZATION (CUSTOM PROCEDURE TRAY) ×2 IMPLANT
SHEATH GLIDE SLENDER 4/5FR (SHEATH) ×2 IMPLANT
SHEATH PINNACLE 7F 10CM (SHEATH) ×3 IMPLANT
SHEATH PROBE COVER 6X72 (BAG) ×2 IMPLANT
SHIELD CATHGARD ARROW (MISCELLANEOUS) IMPLANT
TRANSDUCER W/STOPCOCK (MISCELLANEOUS) ×2 IMPLANT
TRAY CATH 3LUMEN 20C SULFAFREE (CATHETERS) ×1 IMPLANT
TUBING CIL FLEX 10 FLL-RA (TUBING) ×2 IMPLANT
WIRE EMERALD 3MM-J .025X260CM (WIRE) ×1 IMPLANT
WIRE HI TORQ VERSACORE-J 145CM (WIRE) ×1 IMPLANT

## 2017-12-25 NOTE — Progress Notes (Addendum)
Advanced Heart Failure Rounding Note  PCP-Cardiologist: Loralie Champagne, MD   Subjective:    Remains on bumex 4 mg BID + metolazone 2.5 mg daily. Cr stable. K 2.9. Supped.  I/Os not charted. Weight recorded as up 3 pounds overnight.   Feeling OK this am. Denies CP, SOB, or orthopnea. Edema has improved. Was having cramping in her hands and feet overnight. Denies dizziness or presyncope.   Echo 12/24/17: Formal read pending.  Objective:   Weight Range: 289 lb 14.5 oz (131.5 kg) Body mass index is 54.78 kg/m.   Vital Signs:   Temp:  [97.4 F (36.3 C)-97.7 F (36.5 C)] 97.4 F (36.3 C) (08/07 0343) Pulse Rate:  [79-88] 79 (08/07 0355) Resp:  [16-22] 19 (08/07 0355) BP: (94-105)/(66-69) 96/66 (08/07 0355) SpO2:  [100 %] 100 % (08/07 0355) Weight:  [289 lb 14.5 oz (131.5 kg)] 289 lb 14.5 oz (131.5 kg) (08/07 0018) Last BM Date: 12/23/17  Weight change: Filed Weights   12/23/17 0511 12/24/17 0028 12/25/17 0018  Weight: 294 lb 15.6 oz (133.8 kg) 286 lb 9.6 oz (130 kg) 289 lb 14.5 oz (131.5 kg)    Intake/Output:   Intake/Output Summary (Last 24 hours) at 12/25/2017 0806 Last data filed at 12/24/2017 1200 Gross per 24 hour  Intake 3 ml  Output 850 ml  Net -847 ml      Physical Exam   General: Elderly. No resp difficulty. HEENT: Normal anicteric  Neck: Supple. JVP difficult. Appears 8-9 cm. Carotids 2+ bilat; no bruits. No thyromegaly or nodule noted. Cor: PMI nondisplaced. RRR, 2/6 TR  Lungs: Diminished throughout. On O2 via [place].  Abdomen: Obese, soft, non-tender, non-distended, no HSM. No bruits or masses. +BS  Extremities: No cyanosis, clubbing, or rash. 1+ BLE edema.  Neuro: alert & oriented x 3, cranial nerves grossly intact. moves all 4 extremities w/o difficulty. Affect pleasant    Telemetry   NSR 80s, 1st degree AV block, personally reviewed.   EKG    No new tracings.    Labs    CBC Recent Labs    12/22/17 1817  12/23/17 0233 12/25/17 0558    WBC 8.2  --  8.1 6.9  NEUTROABS 5.8  --   --   --   HGB 10.2*   < > 10.0* 10.0*  HCT 32.8*   < > 32.3* 32.5*  MCV 87.9  --  87.8 86.9  PLT 207  --  192 213   < > = values in this interval not displayed.   Basic Metabolic Panel Recent Labs    12/23/17 0233  12/24/17 0436 12/25/17 0558  NA  --    < > 137 138  K  --    < > 3.5 2.9*  CL  --    < > 94* 92*  CO2  --    < > 30 33*  GLUCOSE  --    < > 167* 166*  BUN  --    < > 25* 22  CREATININE 1.29*   < > 1.09* 1.06*  CALCIUM  --    < > 9.0 9.2  MG 1.1*  --  1.9  --    < > = values in this interval not displayed.   Liver Function Tests No results for input(s): AST, ALT, ALKPHOS, BILITOT, PROT, ALBUMIN in the last 72 hours. No results for input(s): LIPASE, AMYLASE in the last 72 hours. Cardiac Enzymes No results for input(s): CKTOTAL, CKMB, CKMBINDEX, TROPONINI in  the last 72 hours.  BNP: BNP (last 3 results) Recent Labs    11/10/17 2302 12/16/17 0943 12/22/17 1817  BNP 2,661.5* 1,895.1* 1,645.6*    ProBNP (last 3 results) No results for input(s): PROBNP in the last 8760 hours.   D-Dimer No results for input(s): DDIMER in the last 72 hours. Hemoglobin A1C Recent Labs    12/22/17 1817  HGBA1C 11.0*   Fasting Lipid Panel No results for input(s): CHOL, HDL, LDLCALC, TRIG, CHOLHDL, LDLDIRECT in the last 72 hours. Thyroid Function Tests No results for input(s): TSH, T4TOTAL, T3FREE, THYROIDAB in the last 72 hours.  Invalid input(s): FREET3  Other results:   Imaging    No results found.   Medications:     Scheduled Medications: . potassium chloride      . aspirin EC  81 mg Oral QHS  . atorvastatin  20 mg Oral q1800  . bumetanide  4 mg Oral BID  . enoxaparin (LOVENOX) injection  60 mg Subcutaneous Daily  . insulin aspart  0-15 Units Subcutaneous TID WC  . insulin aspart  0-5 Units Subcutaneous QHS  . mouth rinse  15 mL Mouth Rinse BID  . metolazone  2.5 mg Oral Daily  . potassium chloride  60 mEq  Oral Once  . potassium chloride  20 mEq Oral Daily  . sodium chloride flush  3 mL Intravenous Q12H  . spironolactone  25 mg Oral Daily    Infusions: . sodium chloride    . sodium chloride 10 mL/hr at 12/25/17 0659    PRN Medications: sodium chloride, ALPRAZolam, docusate sodium, ondansetron (ZOFRAN) IV, senna, sodium chloride flush, zolpidem    Patient Profile   Nina Howell is a 77 y.o. female with a history of combined systolic and diastolic CHF, obstructive sleep apnea, morbid obesity, diabetes mellitus, COPD and asthma.  Admitted with weight gain and BLE edema.   Assessment/Plan    1. Acute on chronic diastolic HF - Echo 09/22/54 LVEF 50-55%, Grade 1 DD, RV severely dilated and severely. Echo improved in 7/17 EF ~45% with septal bounce. RV dilated with mild to moderate dysfunction. PA pressures < 40. Effsuion resolved. Echo 10/2016: EF 55-60% - Volume status looks OK this am.  - Continue bumex 4 mg BID + metolazone 2.5 mg daily. Adjust accordingly after cath.  - Continue spiro 25 mg daily - Add unna boots (ordered, not yet put on) - Followed by HF paramedicine at home - Plan for [facility] this am.  - Repeat echo read pending.   2. 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  - PA pressures much improved on previous echo after diuresis. Repeat echo today.  - Plan R +/- LHC today to clearly define pressures. She is agreeable.  - VQ scan was indeterminate but Chest CT not suggestive of chronic PE, LE dopplers negative.  - ANA 1:80 Positive, RF, P-ANCA negative,  ESR 35. Likely not clinically significant.  - Follows with Dr. Elsworth Soho outpatient - Continue home O2 3. Morbid obesity - Body mass index is 54.78 kg/m. 4. Abnormal ECG with inferior Q waves - No s/s of ischemia.    - Has previously refused cath, but now agreeable.  6. Obesity hypoventilation syndrome - Continue home oxygen. No change. 7.  OSA on BiPAP - Continue BiPAP qHS.  8. Bullous pemphigoid - Followed by Dermatology.  No recent steroids. 9. Deconditioning - PT/OT eval 10. Hypokalemia - Supped this  am prior to cath.  - Cont spiro 25 mg daily  Length of Stay: 3  Shirley Friar, PA-C  12/25/2017, 8:06 AM  Advanced Heart Failure Team Pager [phone] (M-F; 7a - 4p)  Please contact [facility] for night-coverage after hours (4p -7a ) and weekends on [email or web address]  Patient seen and examined with the above-signed Advanced Practice Provider and/or Housestaff. I personally reviewed laboratory data, imaging studies and relevant notes. I independently examined the patient and formulated the important aspects of the plan. I have edited the note to reflect any of my changes or salient points. I have personally discussed the plan with the patient and/or family.  Echo reviewed personally. Now with severe biventricular failure with EF 10-15% RV moderately down. (Previosuly thought to be more RHF>LHF). Feeling better with diuresis but weight recorded as up 3 pounds. BP soft. K is low. Have supped. Plan for R/L cath today. We discussed and she is willing to proceed.   Glori Bickers, MD  8:37 AM

## 2017-12-25 NOTE — Progress Notes (Signed)
OT Cancellation Note  Patient Details Name: Nina Howell MRN: [id number] DOB: 07/19/1940   Cancelled Treatment:    Reason Eval/Treat Not Completed: Patient at procedure or test/ unavailable. Pt currently in cath lab.  Almon Register [id number] 12/25/2017, 9:21 AM

## 2017-12-25 NOTE — Progress Notes (Signed)
Nutrition Consult/Brief Note  RD consulted for nutrition education regarding diabetes.   Lab Results  Component Value Date   HGBA1C 11.0 (H) 12/22/2017   Pt reveals she's very sleepy upon RD visit. Provided "Carbohydrate Counting for People with Diabetes" handout from the Academy of [employer]etics. She states she likes to read and will review handouts on her own. RD informed pt should she have questions and/or concerns RD can return at later date.  Body mass index is 54.78 kg/m. Pt meets criteria for Obesity Class III based on current BMI.  Current diet order is Renal/Carbohydrate Modified. Labs and medications reviewed. No further nutrition interventions warranted at this time.  If additional nutrition issues arise, please re-consult RD.  Arthur Holms, RD, LDN Pager #: [phone] After-Hours Pager #: [phone]

## 2017-12-25 NOTE — Progress Notes (Signed)
PT Cancellation Note  Patient Details Name: Ananya Mccleese MRN: [id number] DOB: 08-13-1940   Cancelled Treatment:    Reason Eval/Treat Not Completed: Patient at procedure or test/unavailable; patient down for heart cath.  Will attempt another day.   Reginia Naas 12/25/2017, 9:19 AM  Magda Kiel, [place] 12/25/2017

## 2017-12-25 NOTE — Progress Notes (Signed)
Pt is on NIV at this time tolerating it well.  

## 2017-12-25 NOTE — H&P (View-Only) (Signed)
Advanced Heart Failure Rounding Note  PCP-Cardiologist: Loralie Champagne, MD   Subjective:    Remains on bumex 4 mg BID + metolazone 2.5 mg daily. Cr stable. K 2.9. Supped.  I/Os not charted. Weight recorded as up 3 pounds overnight.   Feeling OK this am. Denies CP, SOB, or orthopnea. Edema has improved. Was having cramping in her hands and feet overnight. Denies dizziness or presyncope.   Echo 12/24/17: Formal read pending.  Objective:   Weight Range: 289 lb 14.5 oz (131.5 kg) Body mass index is 54.78 kg/m.   Vital Signs:   Temp:  [97.4 F (36.3 C)-97.7 F (36.5 C)] 97.4 F (36.3 C) (08/07 0343) Pulse Rate:  [79-88] 79 (08/07 0355) Resp:  [16-22] 19 (08/07 0355) BP: (94-105)/(66-69) 96/66 (08/07 0355) SpO2:  [100 %] 100 % (08/07 0355) Weight:  [289 lb 14.5 oz (131.5 kg)] 289 lb 14.5 oz (131.5 kg) (08/07 0018) Last BM Date: 12/23/17  Weight change: Filed Weights   12/23/17 0511 12/24/17 0028 12/25/17 0018  Weight: 294 lb 15.6 oz (133.8 kg) 286 lb 9.6 oz (130 kg) 289 lb 14.5 oz (131.5 kg)    Intake/Output:   Intake/Output Summary (Last 24 hours) at 12/25/2017 0806 Last data filed at 12/24/2017 1200 Gross per 24 hour  Intake 3 ml  Output 850 ml  Net -847 ml      Physical Exam   General: Elderly. No resp difficulty. HEENT: Normal anicteric  Neck: Supple. JVP difficult. Appears 8-9 cm. Carotids 2+ bilat; no bruits. No thyromegaly or nodule noted. Cor: PMI nondisplaced. RRR, 2/6 TR  Lungs: Diminished throughout. On O2 via [place].  Abdomen: Obese, soft, non-tender, non-distended, no HSM. No bruits or masses. +BS  Extremities: No cyanosis, clubbing, or rash. 1+ BLE edema.  Neuro: alert & oriented x 3, cranial nerves grossly intact. moves all 4 extremities w/o difficulty. Affect pleasant    Telemetry   NSR 80s, 1st degree AV block, personally reviewed.   EKG    No new tracings.    Labs    CBC Recent Labs    12/22/17 1817  12/23/17 0233 12/25/17 0558    WBC 8.2  --  8.1 6.9  NEUTROABS 5.8  --   --   --   HGB 10.2*   < > 10.0* 10.0*  HCT 32.8*   < > 32.3* 32.5*  MCV 87.9  --  87.8 86.9  PLT 207  --  192 213   < > = values in this interval not displayed.   Basic Metabolic Panel Recent Labs    12/23/17 0233  12/24/17 0436 12/25/17 0558  NA  --    < > 137 138  K  --    < > 3.5 2.9*  CL  --    < > 94* 92*  CO2  --    < > 30 33*  GLUCOSE  --    < > 167* 166*  BUN  --    < > 25* 22  CREATININE 1.29*   < > 1.09* 1.06*  CALCIUM  --    < > 9.0 9.2  MG 1.1*  --  1.9  --    < > = values in this interval not displayed.   Liver Function Tests No results for input(s): AST, ALT, ALKPHOS, BILITOT, PROT, ALBUMIN in the last 72 hours. No results for input(s): LIPASE, AMYLASE in the last 72 hours. Cardiac Enzymes No results for input(s): CKTOTAL, CKMB, CKMBINDEX, TROPONINI in  the last 72 hours.  BNP: BNP (last 3 results) Recent Labs    11/10/17 2302 12/16/17 0943 12/22/17 1817  BNP 2,661.5* 1,895.1* 1,645.6*    ProBNP (last 3 results) No results for input(s): PROBNP in the last 8760 hours.   D-Dimer No results for input(s): DDIMER in the last 72 hours. Hemoglobin A1C Recent Labs    12/22/17 1817  HGBA1C 11.0*   Fasting Lipid Panel No results for input(s): CHOL, HDL, LDLCALC, TRIG, CHOLHDL, LDLDIRECT in the last 72 hours. Thyroid Function Tests No results for input(s): TSH, T4TOTAL, T3FREE, THYROIDAB in the last 72 hours.  Invalid input(s): FREET3  Other results:   Imaging    No results found.   Medications:     Scheduled Medications: . potassium chloride      . aspirin EC  81 mg Oral QHS  . atorvastatin  20 mg Oral q1800  . bumetanide  4 mg Oral BID  . enoxaparin (LOVENOX) injection  60 mg Subcutaneous Daily  . insulin aspart  0-15 Units Subcutaneous TID WC  . insulin aspart  0-5 Units Subcutaneous QHS  . mouth rinse  15 mL Mouth Rinse BID  . metolazone  2.5 mg Oral Daily  . potassium chloride  60 mEq  Oral Once  . potassium chloride  20 mEq Oral Daily  . sodium chloride flush  3 mL Intravenous Q12H  . spironolactone  25 mg Oral Daily    Infusions: . sodium chloride    . sodium chloride 10 mL/hr at 12/25/17 0659    PRN Medications: sodium chloride, ALPRAZolam, docusate sodium, ondansetron (ZOFRAN) IV, senna, sodium chloride flush, zolpidem    Patient Profile   Neshia Lawley is a 76 y.o. female with a history of combined systolic and diastolic CHF, obstructive sleep apnea, morbid obesity, diabetes mellitus, COPD and asthma.  Admitted with weight gain and BLE edema.   Assessment/Plan    1. Acute on chronic diastolic HF - Echo 08/25/15 LVEF 50-55%, Grade 1 DD, RV severely dilated and severely. Echo improved in 7/17 EF ~45% with septal bounce. RV dilated with mild to moderate dysfunction. PA pressures < 40. Effsuion resolved. Echo 10/2016: EF 55-60% - Volume status looks OK this am.  - Continue bumex 4 mg BID + metolazone 2.5 mg daily. Adjust accordingly after cath.  - Continue spiro 25 mg daily - Add unna boots (ordered, not yet put on) - Followed by HF paramedicine at home - Plan for R/LHC this am.  - Repeat echo read pending.   2. 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  - PA pressures much improved on previous echo after diuresis. Repeat echo today.  - Plan R +/- LHC today to clearly define pressures. She is agreeable.  - VQ scan was indeterminate but Chest CT not suggestive of chronic PE, LE dopplers negative.  - ANA 1:80 Positive, RF, P-ANCA negative,  ESR 35. Likely not clinically significant.  - Follows with Dr. Alva outpatient - Continue home O2 3. Morbid obesity - Body mass index is 54.78 kg/m. 4. Abnormal ECG with inferior Q waves - No s/s of ischemia.    - Has previously refused cath, but now agreeable.  6. Obesity hypoventilation syndrome - Continue home oxygen. No change. 7.  OSA on BiPAP - Continue BiPAP qHS.  8. Bullous pemphigoid - Followed by Dermatology.  No recent steroids. 9. Deconditioning - PT/OT eval 10. Hypokalemia - Supped this   am prior to cath.  - Cont spiro 25 mg daily  Length of Stay: 3  Shirley Friar, PA-C  12/25/2017, 8:06 AM  Advanced Heart Failure Team Pager [phone] (M-F; 7a - 4p)  Please contact [facility] for night-coverage after hours (4p -7a ) and weekends on [email or web address]  Patient seen and examined with the above-signed Advanced Practice Provider and/or Housestaff. I personally reviewed laboratory data, imaging studies and relevant notes. I independently examined the patient and formulated the important aspects of the plan. I have edited the note to reflect any of my changes or salient points. I have personally discussed the plan with the patient and/or family.  Echo reviewed personally. Now with severe biventricular failure with EF 10-15% RV moderately down. (Previosuly thought to be more RHF>LHF). Feeling better with diuresis but weight recorded as up 3 pounds. BP soft. K is low. Have supped. Plan for R/L cath today. We discussed and she is willing to proceed.   Glori Bickers, MD  8:37 AM

## 2017-12-25 NOTE — Interval H&P Note (Signed)
History and Physical Interval Note:  12/25/2017 8:42 AM  Nina Howell  has presented today for surgery, with the diagnosis of Heart Failure  The various methods of treatment have been discussed with the patient and family. After consideration of risks, benefits and other options for treatment, the patient has consented to  Procedure(s): RIGHT/LEFT HEART CATH AND CORONARY ANGIOGRAPHY (N/A) and possible coronary angioplasty as a surgical intervention .  The patient's history has been reviewed, patient examined, no change in status, stable for surgery.  I have reviewed the patient's chart and labs.  Questions were answered to the patient's satisfaction.     Daniel Bensimhon

## 2017-12-26 LAB — CBC
HCT: 31.9 % — ABNORMAL LOW (ref 36.0–46.0)
HEMOGLOBIN: 9.8 g/dL — AB (ref 12.0–15.0)
MCH: 26.7 pg (ref 26.0–34.0)
MCHC: 30.7 g/dL (ref 30.0–36.0)
MCV: 86.9 fL (ref 78.0–100.0)
PLATELETS: 193 10*3/uL (ref 150–400)
RBC: 3.67 MIL/uL — AB (ref 3.87–5.11)
RDW: 17.4 % — ABNORMAL HIGH (ref 11.5–15.5)
WBC: 6.6 10*3/uL (ref 4.0–10.5)

## 2017-12-26 LAB — GLUCOSE, CAPILLARY
Glucose-Capillary: 126 mg/dL — ABNORMAL HIGH (ref 70–99)
Glucose-Capillary: 185 mg/dL — ABNORMAL HIGH (ref 70–99)
Glucose-Capillary: 253 mg/dL — ABNORMAL HIGH (ref 70–99)
Glucose-Capillary: 243 mg/dL — ABNORMAL HIGH (ref 70–99)

## 2017-12-26 LAB — COOXEMETRY PANEL
CARBOXYHEMOGLOBIN: 1.6 % — AB (ref 0.5–1.5)
Carboxyhemoglobin: 1.9 % — ABNORMAL HIGH (ref 0.5–1.5)
METHEMOGLOBIN: 1.6 % — AB (ref 0.0–1.5)
Methemoglobin: 0.9 % (ref 0.0–1.5)
O2 SAT: 69.4 %
O2 Saturation: 51.6 %
TOTAL HEMOGLOBIN: 9.8 g/dL — AB (ref 12.0–16.0)
Total hemoglobin: 9.9 g/dL — ABNORMAL LOW (ref 12.0–16.0)

## 2017-12-26 LAB — BASIC METABOLIC PANEL
ANION GAP: 11 (ref 5–15)
BUN: 22 mg/dL (ref 8–23)
CO2: 35 mmol/L — AB (ref 22–32)
Calcium: 9.3 mg/dL (ref 8.9–10.3)
Chloride: 94 mmol/L — ABNORMAL LOW (ref 98–111)
Creatinine, Ser: 1.07 mg/dL — ABNORMAL HIGH (ref 0.44–1.00)
GFR calc Af Amer: 57 mL/min — ABNORMAL LOW (ref >60)
GFR, EST NON AFRICAN AMERICAN: 49 mL/min — AB (ref >60)
GLUCOSE: 132 mg/dL — AB (ref 70–99)
POTASSIUM: 3.3 mmol/L — AB (ref 3.5–5.1)
Sodium: 140 mmol/L (ref 135–145)

## 2017-12-26 MED ORDER — SENNA 8.6 MG PO TABS
2.0000 | ORAL_TABLET | Freq: Every day | ORAL | Status: DC | PRN
  Administered 2017-12-26: 17.2 mg via ORAL
  Filled 2017-12-26: qty 2

## 2017-12-26 MED ORDER — POTASSIUM CHLORIDE CRYS ER 20 MEQ PO TBCR
40.0000 meq | EXTENDED_RELEASE_TABLET | Freq: Two times a day (BID) | ORAL | Status: DC
  Administered 2017-12-26 – 2017-12-28 (×5): 40 meq via ORAL
  Filled 2017-12-26 (×5): qty 2

## 2017-12-26 MED ORDER — POTASSIUM CHLORIDE CRYS ER 20 MEQ PO TBCR: 20.0000 meq | EXTENDED_RELEASE_TABLET | Freq: Two times a day (BID) | ORAL | Status: DC

## 2017-12-26 MED ORDER — POTASSIUM CHLORIDE 20 MEQ/15ML (10%) PO SOLN
60.0000 meq | Freq: Once | ORAL | Status: DC
  Filled 2017-12-26: qty 45

## 2017-12-26 NOTE — Progress Notes (Signed)
Physical Therapy Treatment Patient Details Name: Nina Howell MRN: [id number] DOB: May 17, 1941 Today's Date: 12/26/2017    History of Present Illness 77 y.o. female with a history of combined systolic and diastolic CHF, obstructive sleep apnea, morbid obesity, diabetes mellitus, COPD and asthma who presents with complaints of recent weight gain and worsening lower extremity edema.    PT Comments    Pt c/o Lt foot pain as well as fatigue and attempted to refuse session. PT educated pt on importance of mobility and strengthening to healing, pt continues to refuse out of bed or bed level exercise. Pt was agreeable to scoot up in bed for improved positioning. Pt requires min A to scoot to the head of bed. Pt left in room with needs at hand, RN aware of LT foot pain and request for pain meds.    Follow Up Recommendations  Home health PT     Equipment Recommendations  Rolling walker with 5" wheels    Recommendations for Other Services       Precautions / Restrictions Precautions Precautions: Fall Restrictions Weight Bearing Restrictions: No    Mobility  Bed Mobility               General bed mobility comments: pt performed scooting up in bed with increased time, assist of bedrails and min A  Transfers                    Ambulation/Gait                 Stairs             Wheelchair Mobility    Modified Rankin (Stroke Patients Only)       Balance                                            Cognition Arousal/Alertness: Awake/alert Behavior During Therapy: WFL for tasks assessed/performed Overall Cognitive Status: Within Functional Limits for tasks assessed                                        Exercises      General Comments        Pertinent Vitals/Pain Pain Assessment: Faces Faces Pain Scale: Hurts whole lot Pain Location: Lt lateral foot Pain Descriptors / Indicators: Aching Pain  Intervention(s): Limited activity within patient's tolerance;Monitored during session;Patient requesting pain meds-RN notified    Home Living                      Prior Function            PT Goals (current goals can now be found in the care plan section) Acute Rehab PT Goals Patient Stated Goal: be able to go up/down the stairs Time For Goal Achievement: 01/07/18 Potential to Achieve Goals: Good Progress towards PT goals: PT to reassess next treatment    Frequency    Min 3X/week      PT Plan      Co-evaluation              AM-PAC PT "6 Clicks" Daily Activity  Outcome Measure  Difficulty turning over in bed (including adjusting bedclothes, sheets and blankets)?: A Lot Difficulty moving from lying on back to sitting on the  side of the bed? : A Lot Difficulty sitting down on and standing up from a chair with arms (e.g., wheelchair, bedside commode, etc,.)?: Unable Help needed moving to and from a bed to chair (including a wheelchair)?: Total Help needed walking in hospital room?: Total Help needed climbing 3-5 steps with a railing? : Total 6 Click Score: 8    End of Session Equipment Utilized During Treatment: Oxygen Activity Tolerance: Patient limited by fatigue;Patient limited by pain Patient left: in bed;with call bell/phone within reach;with nursing/sitter in room Nurse Communication: Mobility status;Patient requests pain meds PT Visit Diagnosis: Difficulty in walking, not elsewhere classified (R26.2);Muscle weakness (generalized) (M62.81)     Time: 8811-0315 PT Time Calculation (min) (ACUTE ONLY): 9 min  Charges:  $Therapeutic Activity: 8-22 mins                     Isabelle Course, PT, DPT   Icie Kuznicki 12/26/2017, 10:32 AM

## 2017-12-26 NOTE — Progress Notes (Signed)
Patient and family discussed DNR status at length today with Dr. Haroldine Laws.  Family and patient have decided to change code status to DNR/DNI.  Order placed.

## 2017-12-26 NOTE — Progress Notes (Addendum)
Advanced Heart Failure Rounding Note  PCP-Cardiologist: Dr. Haroldine Laws   Subjective:    Remains on bumex 4 mg BID + metolazone 2.5 mg daily. Cr stable. K 3.3. Supped.  I/Os not charted. Weight not recorded.  Coox 69.4%, in stark contrast to 30% range yesterday in cath lab.   Feeling better this am. Breathing is OK. Her bullous impetigo is acting up and itching. Denies lightheadedness.  Echo 12/24/17: LVEF 10-15%, Grade 1 DD, MIld AI, Mild MR, Mild LAE, Mod RV dilation, Mod RV reduction, severe TR.   R/LHC 12/25/17 Ao = 87/59 (69) LV = 92/26 RA = 15 RV = 44/18 PA = 46/24 (31) PCW = 18 Fick cardiac output/index = 3.4/1.5 Thermo CO/CI 3.3/1.5 PVR = 4.0 WU SVR = 1286 Ao sat = 99% PA sat = 34%, 35% RA/PCWP = 0.83 PaPI = 1.5 CPO = 0.52  Assessment: 1. 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 Elevated filling pressures with markedly low cardiac output c/w shock physiology   Objective:   Weight Range: 131.5 kg Body mass index is 54.78 kg/m.   Vital Signs:   Temp:  [97.1 F (36.2 C)-98 F (36.7 C)] 97.9 F (36.6 C) (08/08 0431) Pulse Rate:  [0-129] 84 (08/08 0745) Resp:  [0-67] 20 (08/08 0745) BP: (92-147)/(62-125) 96/62 (08/08 0745) SpO2:  [0 %-100 %] 100 % (08/08 0745) Last BM Date: 12/25/17  Weight change: Filed Weights   12/23/17 0511 12/24/17 0028 12/25/17 0018  Weight: 133.8 kg 130 kg 131.5 kg   Intake/Output:  No intake or output data in the 24 hours ending 12/26/17 0746   Physical Exam   CVP 15 General: Elderly. No resp difficulty. HEENT: Normal anicteric. Poor dentition  Neck: Supple. JVP difficult. Carotids 2+ bilat; no bruits. No thyromegaly or nodule noted. Cor: PMI nondisplaced. RRR, 2/6 TR Lungs: Diminished throughout. On O2 via .  Abdomen: Obese, soft, non-tender, non-distended, no HSM. No bruits or masses. +BS  Extremities: No  cyanosis, clubbing, or rash. 1+ BLE edema. Mutiple covered bullae RUE and LLE. Unna boots in place. Neuro: alert & oriented x 3, cranial nerves grossly intact. moves all 4 extremities w/o difficulty. Affect pleasant    Telemetry   NSR 80s, 1st degree AV block, personally reviewed.   EKG    No new tracings.    Labs    CBC Recent Labs    12/25/17 0558 12/26/17 0322  WBC 6.9 6.6  HGB 10.0* 9.8*  HCT 32.5* 31.9*  MCV 86.9 86.9  PLT 213 347   Basic Metabolic Panel Recent Labs    12/24/17 0436 12/25/17 0558 12/26/17 0322  NA 137 138 140  K 3.5 2.9* 3.3*  CL 94* 92* 94*  CO2 30 33* 35*  GLUCOSE 167* 166* 132*  BUN 25* 22 22  CREATININE 1.09* 1.06* 1.07*  CALCIUM 9.0 9.2 9.3  MG 1.9  --   --    Liver Function Tests No results for input(s): AST, ALT, ALKPHOS, BILITOT, PROT, ALBUMIN in the last 72 hours. No results for input(s): LIPASE, AMYLASE in the last 72 hours. Cardiac Enzymes No results for input(s): CKTOTAL, CKMB, CKMBINDEX, TROPONINI in the last 72 hours.  BNP: BNP (last 3 results) Recent Labs    11/10/17 2302 12/16/17 0943 12/22/17 1817  BNP 2,661.5* 1,895.1* 1,645.6*    ProBNP (last 3 results) No results for input(s): PROBNP in the last 8760  hours.   D-Dimer No results for input(s): DDIMER in the last 72 hours. Hemoglobin A1C No results for input(s): HGBA1C in the last 72 hours. Fasting Lipid Panel No results for input(s): CHOL, HDL, LDLCALC, TRIG, CHOLHDL, LDLDIRECT in the last 72 hours. Thyroid Function Tests No results for input(s): TSH, T4TOTAL, T3FREE, THYROIDAB in the last 72 hours.  Invalid input(s): FREET3  Other results:   Imaging    No results found.   Medications:     Scheduled Medications: . aspirin EC  81 mg Oral QHS  . atorvastatin  20 mg Oral q1800  . bumetanide  4 mg Oral BID  . enoxaparin (LOVENOX) injection  40 mg Subcutaneous Q24H  . insulin aspart  0-15 Units Subcutaneous TID WC  . insulin aspart  0-5 Units  Subcutaneous QHS  . mouth rinse  15 mL Mouth Rinse BID  . metolazone  2.5 mg Oral Daily  . potassium chloride  20 mEq Oral Daily  . sodium chloride flush  3 mL Intravenous Q12H  . sodium chloride flush  3 mL Intravenous Q12H  . spironolactone  25 mg Oral Daily    Infusions: . sodium chloride    . sodium chloride      PRN Medications: sodium chloride, sodium chloride, acetaminophen, ALPRAZolam, docusate sodium, ondansetron (ZOFRAN) IV, senna, sodium chloride flush, sodium chloride flush, zolpidem  Patient Profile   Nina Howell is a 77 y.o. female with a history of combined systolic and diastolic CHF, obstructive sleep apnea, morbid obesity, diabetes mellitus, COPD and asthma.  Admitted with weight gain and BLE edema.   Assessment/Plan   1. Acute on chronic biventricular heart failure  - Echo 12/24/17: LVEF 10-15%, Grade 1 DD, MIld AI, Mild MR, Mild LAE, Mod RV dilation, Mod RV reduction, severe TR. - Coox 69.4% today via PICC, compared to 34% yesterday in cath lab. Repeat.  - Volume status elevated by cath 12/25/17 with CVP 15 with wedge 18.  CVP 10-12 this am. - Continue bumex 4 mg BID + metolazone 2.5 mg daily for now.  - Continue spiro 25 mg daily - Continue unna boots  - Followed by HF paramedicine at home 2. 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  - PA pressures 46/24 (31) by cath 12/25/17 - VQ scan was indeterminate but Chest CT not suggestive of chronic PE, LE dopplers negative.  - ANA 1:80 Positive, RF, P-ANCA negative,  ESR 35. Likely not clinically significant.  - Follows with Dr. Elsworth Soho outpatient - Continue home O2 3. Morbid obesity - Body mass index is 54.78 kg/m. 4. Abnormal ECG with inferior Q waves - No s/s of ischemia.    - Has previously refused cath, but now agreeable.  6. Obesity hypoventilation syndrome - Continue home oxygen. No change.  7. OSA on BiPAP - Continue BiPAP  qHS. No change.  8. Bullous pemphigoid - Followed by Dermatology.  No recent steroids.No change.  9. Deconditioning - PT/OT eval. 10. Hypokalemia - K 2.9 -> 3.3 Continue supp.  - Will increase K to 40 meq BID.  - Cont spiro 25 mg daily  Length of Stay: 4  Shirley Friar, Hershal Coria  12/26/2017, 7:46 AM  Advanced Heart Failure Team Pager [phone] (M-F; 7a - 4p)  Please contact [facility] for night-coverage after hours (4p -7a ) and weekends on [email or web address]  Patient seen and examined with the above-signed Advanced Practice Provider and/or Housestaff. I personally  reviewed laboratory data, imaging studies and relevant notes. I independently examined the patient and formulated the important aspects of the plan. I have edited the note to reflect any of my changes or salient points. I have personally discussed the plan with the patient and/or family.  Remains very tenuous. CVP checked personally and remains 15. Co-ox at 50%. Long talk with her and her granddaughter about how bad her HF is and the fact that her life exepectancy is likely < 1 year. We talked about code status and I reinforced the fact theat DNR/DNI did not mean do not treat. They will discuss among themselves. For now will continue diuresis. BP too soft to add ACE/ARB. No b-blocker with low output.   Glori Bickers, MD  5:59 PM

## 2017-12-26 NOTE — Plan of Care (Signed)
  Problem: Education: Goal: Knowledge of General Education information will improve Description Including pain rating scale, medication(s)/side effects and non-pharmacologic comfort measures Outcome: Progressing   Problem: Elimination: Goal: Will not experience complications related to bowel motility Outcome: Progressing Goal: Will not experience complications related to urinary retention Outcome: Progressing   Problem: Health Behavior/Discharge Planning: Goal: Ability to manage health-related needs will improve Outcome: Not Progressing   Problem: Clinical Measurements: Goal: Ability to maintain clinical measurements within normal limits will improve Outcome: Not Progressing Goal: Respiratory complications will improve Outcome: Not Progressing Goal: Cardiovascular complication will be avoided Outcome: Not Progressing

## 2017-12-26 NOTE — Evaluation (Signed)
Occupational Therapy Evaluation Patient Details Name: Nina Howell MRN: [id number] DOB: [birth year] Today's Date: 12/26/2017    History of Present Illness 77 y.o. female with a history of combined systolic and diastolic CHF, obstructive sleep apnea, morbid obesity, diabetes mellitus, COPD and asthma who presents with complaints of recent weight gain and worsening lower extremity edema.   Clinical Impression   PTA, pt was living with her husband and required assistance for bathing and dressing from husband, aide, or daughter; pt uses [facility] for functional mobility around (second floor) of home. Pt currently requiring Max-Total A for dressing, bathing, and toileting and declined OOB activity. Pt requiring Min A to adjust position in bed and scoot towards HOB; pt able to use bed rails and trendelenburg position. Pt would benefit from further acute OT to facilitate safe dc. Recommend dc to home with HHOT for further OT to optimize safety, independence with ADLs, and return to PLOF.      Follow Up Recommendations  Home health OT;Supervision/Assistance - 24 hour    Equipment Recommendations  None recommended by OT    Recommendations for Other Services PT consult     Precautions / Restrictions Precautions Precautions: Fall Restrictions Weight Bearing Restrictions: No      Mobility Bed Mobility Overal bed mobility: Needs Assistance             General bed mobility comments: Pt able to use BUEs to pull self up in bed once placed in trendelenburg. Declining OOB activity or to sit at EOB due to pain at foot.  Transfers                      Balance                                           ADL either performed or assessed with clinical judgement   ADL Overall ADL's : Needs assistance/impaired                                       General ADL Comments: Limited evaluation due to pt declining OOB activity and ADLs at this time due to  pain at left foot. Pt asking for assistance with bed mobility. Pt currently requiring Max-Total A for dressing, bathing, and toileting.     Vision         Perception     Praxis      Pertinent Vitals/Pain Pain Assessment: Faces Faces Pain Scale: Hurts whole lot Pain Location: Lt lateral foot Pain Descriptors / Indicators: Aching Pain Intervention(s): Limited activity within patient's tolerance;Repositioned     Hand Dominance Left   Extremity/Trunk Assessment Upper Extremity Assessment Upper Extremity Assessment: Generalized weakness   Lower Extremity Assessment Lower Extremity Assessment: Generalized weakness   Cervical / Trunk Assessment Cervical / Trunk Assessment: Kyphotic;Other exceptions Cervical / Trunk Exceptions: Increased body habitus   Communication Communication Communication: No difficulties   Cognition Arousal/Alertness: Awake/alert Behavior During Therapy: WFL for tasks assessed/performed Overall Cognitive Status: Within Functional Limits for tasks assessed                                     General Comments  98% 2L, 83 HR, 17RR  Exercises     Shoulder Instructions      Home Living Family/patient expects to be discharged to:: Private residence Living Arrangements: Spouse/significant other Available Help at Discharge: Family;Personal care attendant;Available PRN/intermittently;Home health Type of Home: Apartment Home Access: Stairs to enter Entrance Stairs-Number of Steps: flight Entrance Stairs-Rails: Right Home Layout: One level     Bathroom Shower/Tub: Teacher, early years/pre: Standard     Home Equipment: Environmental consultant - 4 wheels;Cane - single point;Bedside commode;Tub bench   Additional Comments: Aide 9-12 two days a week      Prior Functioning/Environment Level of Independence: Needs assistance  Gait / Transfers Assistance Needed: typically mobilizes around home with [facility], ADL's / Homemaking Assistance  Needed: Aide assists with bathing. Husband, daughter, and aide perform IADLs   Comments: [employer] are called to assist with stairs        OT Problem List: Decreased strength;Decreased range of motion;Decreased activity tolerance;Decreased knowledge of use of DME or AE;Decreased knowledge of precautions;Pain      OT Treatment/Interventions: Self-care/ADL training;Therapeutic exercise;Energy conservation;DME and/or AE instruction;Therapeutic activities;Patient/family education    OT Goals(Current goals can be found in the care plan section) Acute Rehab OT Goals Patient Stated Goal: "Go home" OT Goal Formulation: With patient Time For Goal Achievement: 01/09/18 Potential to Achieve Goals: Good  OT Frequency: Min 2X/week   Barriers to D/C:            Co-evaluation              AM-PAC PT "6 Clicks" Daily Activity     Outcome Measure Help from another person eating meals?: None Help from another person taking care of personal grooming?: A Little Help from another person toileting, which includes using toliet, bedpan, or urinal?: A Lot Help from another person bathing (including washing, rinsing, drying)?: A Lot Help from another person to put on and taking off regular upper body clothing?: A Lot Help from another person to put on and taking off regular lower body clothing?: A Lot 6 Click Score: 15   End of Session Equipment Utilized During Treatment: Oxygen(2L) Nurse Communication: Mobility status  Activity Tolerance: Patient limited by pain Patient left: in bed;with call bell/phone within reach;with bed alarm set;with family/visitor present  OT Visit Diagnosis: Unsteadiness on feet (R26.81);Other abnormalities of gait and mobility (R26.89);Muscle weakness (generalized) (M62.81);Pain Pain - Right/Left: Left Pain - part of body: Ankle and joints of foot                Time: 1255-1313 OT Time Calculation (min): 18 min Charges:  OT General Charges $OT Visit: 1 Visit OT  Evaluation $OT Eval Moderate Complexity: 1 Mod  Anshul Meddings MSOT, OTR/L Acute Rehab Pager: [phone] Office: [place] 12/26/2017, 2:22 PM

## 2017-12-27 ENCOUNTER — Other Ambulatory Visit (HOSPITAL_COMMUNITY): Payer: Self-pay

## 2017-12-27 LAB — BASIC METABOLIC PANEL
Anion gap: 12 (ref 5–15)
BUN: 23 mg/dL (ref 8–23)
CALCIUM: 9 mg/dL (ref 8.9–10.3)
CO2: 33 mmol/L — ABNORMAL HIGH (ref 22–32)
CREATININE: 1.26 mg/dL — AB (ref 0.44–1.00)
Chloride: 92 mmol/L — ABNORMAL LOW (ref 98–111)
GFR calc Af Amer: 47 mL/min — ABNORMAL LOW (ref >60)
GFR, EST NON AFRICAN AMERICAN: 41 mL/min — AB (ref >60)
GLUCOSE: 141 mg/dL — AB (ref 70–99)
Potassium: 3.1 mmol/L — ABNORMAL LOW (ref 3.5–5.1)
SODIUM: 137 mmol/L (ref 135–145)

## 2017-12-27 LAB — GLUCOSE, CAPILLARY
GLUCOSE-CAPILLARY: 213 mg/dL — AB (ref 70–99)
GLUCOSE-CAPILLARY: 204 mg/dL — AB (ref 70–99)
Glucose-Capillary: 157 mg/dL — ABNORMAL HIGH (ref 70–99)
Glucose-Capillary: 207 mg/dL — ABNORMAL HIGH (ref 70–99)

## 2017-12-27 LAB — COOXEMETRY PANEL
Carboxyhemoglobin: 1.4 % (ref 0.5–1.5)
METHEMOGLOBIN: 1.5 % (ref 0.0–1.5)
O2 Saturation: 44.2 %
TOTAL HEMOGLOBIN: 9.5 g/dL — AB (ref 12.0–16.0)

## 2017-12-27 MED ORDER — SPIRONOLACTONE 25 MG PO TABS: 25.0000 mg | ORAL_TABLET | Freq: Every day | ORAL | 5 refills | Status: AC

## 2017-12-27 MED ORDER — POTASSIUM CHLORIDE CRYS ER 20 MEQ PO TBCR
40.0000 meq | EXTENDED_RELEASE_TABLET | Freq: Once | ORAL | Status: AC
  Administered 2017-12-27: 40 meq via ORAL
  Filled 2017-12-27: qty 2

## 2017-12-27 NOTE — Progress Notes (Signed)
Inpatient Diabetes Program Recommendations  AACE/ADA: New Consensus Statement on Inpatient Glycemic Control (2019)  Target Ranges:  Prepandial:   less than 140 mg/dL      Peak postprandial:   less than 180 mg/dL (1-2 hours)      Critically ill patients:  140 - 180 mg/dL   Results for ARRAYAH, CONNORS (MRN [id number]) as of 12/27/2017 09:34  Ref. Range 12/26/2017 06:34 12/26/2017 11:36 12/26/2017 16:13 12/26/2017 22:01 12/27/2017 06:05  Glucose-Capillary Latest Ref Range: 70 - 99 mg/dL 126 (H) 185 (H) 253 (H) 243 (H) 157 (H)   Review of Glycemic Control  Diabetes history: DM2 Outpatient Diabetes medications: Novolin 70/30 26 units in AM, 16 units QPM Current orders for Inpatient glycemic control: Novolog 0-15 units TID with meals, Novolog 0-5 units QHS  Inpatient Diabetes Program Recommendations:  Insulin - Meal Coverage: Please consider ordering Novolog 3 units TID with meals for meal coverage if patient eats at least 50% of meals.  Thanks, Barnie Alderman, RN, MSN, CDE Diabetes Coordinator Inpatient Diabetes Program [phone] (Team Pager from 8am to 5pm)

## 2017-12-27 NOTE — Consult Note (Signed)
   [facility] CM Inpatient Consult   12/27/2017  Nina Howell 01-18-1941 [id number]  Follow up:  Received a call from inpatient RNCM, Lanelle Bal regarding ongoing follow up needs with [facility] Care Management.  [place] Managers will continue to follow for disposition, resources, and disease/care management needs.  For questions, please contact:  Natividad Brood, RN BSN [facility] Liaison  [phone] business mobile phone Toll free office [phone]

## 2017-12-27 NOTE — Progress Notes (Addendum)
Advanced Heart Failure Rounding Note  PCP-Cardiologist: Dr. Haroldine Howell   Subjective:    Remains on bumex 4 mg BID + metolazone 2.5 mg daily. Cr slightly up, 1.07 -> 1.26. K 3.1.   Coox 44.2% this am. Consistent with cath numbers. Not on inotropes.   Requested DNR/DNI overnight after a long discussion yesterday.   Feeling OK this am. Biggest complaint is her bullae on her arms and legs. Denies SOB. Foot feels weak. No orthopnea or PND.   Echo 12/24/17: LVEF 10-15%, Grade 1 DD, MIld AI, Mild MR, Mild LAE, Mod RV dilation, Mod RV reduction, severe TR.   R/LHC 12/25/17 Ao = 87/59 (69) LV = 92/26 RA = 15 RV = 44/18 PA = 46/24 (31) PCW = 18 Fick cardiac output/index = 3.4/1.5 Thermo CO/CI 3.3/1.5 PVR = 4.0 WU SVR = 1286 Ao sat = 99% PA sat = 34%, 35% RA/PCWP = 0.83 PaPI = 1.5 CPO = 0.52  Assessment: 1. 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 Elevated filling pressures with markedly low cardiac output c/w shock physiology   Objective:   Weight Range: 129.4 kg Body mass index is 53.9 kg/m.   Vital Signs:   Temp:  [97.6 F (36.4 C)-98.1 F (36.7 C)] 98 F (36.7 C) (08/09 0409) Pulse Rate:  [78-90] 81 (08/09 0409) Resp:  [18-23] 21 (08/09 0409) BP: (84-100)/(54-70) 94/63 (08/09 0409) SpO2:  [94 %-100 %] 99 % (08/09 0409) Weight:  [129.4 kg] 129.4 kg (08/09 0411) Last BM Date: 12/26/17  Weight change: Filed Weights   12/24/17 0028 12/25/17 0018 12/27/17 0411  Weight: 130 kg 131.5 kg 129.4 kg   Intake/Output:   Intake/Output Summary (Last 24 hours) at 12/27/2017 0723 Last data filed at 12/27/2017 0415 Gross per 24 hour  Intake 75 ml  Output 700 ml  Net -625 ml     Physical Exam   CVP 8-9 lying in bed.  General: Elderly appearing. No resp difficulty. HEENT: Normal anicteric  Neck: Supple. JVP difficult. Carotids 2+ bilat; no bruits. No thyromegaly or  nodule noted. Cor: PMI nondisplaced. RRR, 2/6 TR Lungs: Diminished throughout, on O2 via [place].  Abdomen: obesesSoft, non-tender, non-distended, no HSM. No bruits or masses. +BS  Extremities: No cyanosis, clubbing, or rash. Trace to 1+ BLE edema. Multiple covered bullae RUE and LLE. Unna boots in place.  Neuro: alert & oriented x 3, cranial nerves grossly intact. moves all 4 extremities w/o difficulty. Affect pleasant    Telemetry   NSR 80s, 1st degree AV block, personally reviewed.   EKG    No new tracings.   Labs    CBC Recent Labs    12/25/17 0558 12/26/17 0322  WBC 6.9 6.6  HGB 10.0* 9.8*  HCT 32.5* 31.9*  MCV 86.9 86.9  PLT 213 846   Basic Metabolic Panel Recent Labs    12/26/17 0322 12/27/17 0422  NA 140 137  K 3.3* 3.1*  CL 94* 92*  CO2 35* 33*  GLUCOSE 132* 141*  BUN 22 23  CREATININE 1.07* 1.26*  CALCIUM 9.3 9.0   Liver Function Tests No results for input(s): AST, ALT, ALKPHOS, BILITOT, PROT, ALBUMIN in the last 72 hours. No results for input(s): LIPASE, AMYLASE in the last 72 hours. Cardiac Enzymes No results for input(s): CKTOTAL, CKMB, CKMBINDEX, TROPONINI in the last 72 hours.  BNP: BNP (last 3 results) Recent Labs  11/10/17 2302 12/16/17 0943 12/22/17 1817  BNP 2,661.5* 1,895.1* 1,645.6*    ProBNP (last 3 results) No results for input(s): PROBNP in the last 8760 hours.   D-Dimer No results for input(s): DDIMER in the last 72 hours. Hemoglobin A1C No results for input(s): HGBA1C in the last 72 hours. Fasting Lipid Panel No results for input(s): CHOL, HDL, LDLCALC, TRIG, CHOLHDL, LDLDIRECT in the last 72 hours. Thyroid Function Tests No results for input(s): TSH, T4TOTAL, T3FREE, THYROIDAB in the last 72 hours.  Invalid input(s): FREET3  Other results:   Imaging    No results found.   Medications:     Scheduled Medications: . aspirin EC  81 mg Oral QHS  . atorvastatin  20 mg Oral q1800  . bumetanide  4 mg Oral BID    . enoxaparin (LOVENOX) injection  40 mg Subcutaneous Q24H  . insulin aspart  0-15 Units Subcutaneous TID WC  . insulin aspart  0-5 Units Subcutaneous QHS  . mouth rinse  15 mL Mouth Rinse BID  . metolazone  2.5 mg Oral Daily  . potassium chloride  40 mEq Oral BID  . sodium chloride flush  3 mL Intravenous Q12H  . sodium chloride flush  3 mL Intravenous Q12H  . spironolactone  25 mg Oral Daily    Infusions: . sodium chloride    . sodium chloride      PRN Medications: sodium chloride, sodium chloride, acetaminophen, ALPRAZolam, docusate sodium, ondansetron (ZOFRAN) IV, senna, sodium chloride flush, sodium chloride flush, zolpidem  Patient Profile   Nina Howell is a 77 y.o. female with a history of combined systolic and diastolic CHF, obstructive sleep apnea, morbid obesity, diabetes mellitus, COPD and asthma.  Admitted with weight gain and BLE edema.   Assessment/Plan   1. Acute on chronic biventricular heart failure  - Echo 12/24/17: LVEF 10-15%, Grade 1 DD, MIld AI, Mild MR, Mild LAE, Mod RV dilation, Mod RV reduction, severe TR. - Coox 69.4% today via PICC, compared to 34% yesterday in cath lab. Repeat.  - Volume status elevated by cath 12/25/17.  - CVP 8-9 on my check, lying in bed.   - Continue bumex 4 mg BID + metolazone 2.5 mg daily for now.  - Continue spiro 25 mg daily - Continue unna boots  - Followed by HF paramedicine at home 2. 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  - PA pressures 46/24 (31) by cath 12/25/17 - VQ scan was indeterminate but Chest CT not suggestive of chronic PE, LE dopplers negative.  - ANA 1:80 Positive, RF, P-ANCA negative,  ESR 35. Likely not clinically significant.  - Follows with Dr. Elsworth Howell outpatient - Continue home O2. No change.  3. Morbid obesity - Body mass index is 53.9 kg/m. 4. Abnormal ECG with inferior Q waves - No s/s of ischemia.    6. Obesity  hypoventilation syndrome - Continue home oxygen. No change.  7. OSA on BiPAP - Continue BiPAP qHS. No change.  8. Bullous pemphigoid - Followed by Dermatology.  No recent steroids. No change.   9. Deconditioning - PT/OT eval. 10. Hypokalemia - K 2.9 -> 3.3 -> 3.1. Adjust supp.  - Continue K 40 meq BID with extra 40 meq this am. (Refuses K solution) - Cont spiro 25 mg daily 11. Disposition - Pt is now DNR/DNI. - PT recommends HHPT.   Length of Stay: [place], [place]  12/27/2017, 7:23 AM  Advanced Heart Failure Team Pager [phone] (M-F; 7a - 4p)  Please contact [facility] for night-coverage after hours (4p -7a ) and weekends on [email or web address]  Patient seen and examined with the above-signed Advanced Practice Provider and/or Housestaff. I personally reviewed laboratory data, imaging studies and relevant notes. I independently examined the patient and formulated the important aspects of the plan. I have edited the note to reflect any of my changes or salient points. I have personally discussed the plan with the patient and/or family.  She has end-stage biventricular HF. Not candidate for advanced therapies or home inotropes. I think she is about as good as we can get her. I have recommended SNF but she wants to go home as she has help at home. Long discussion with her and family about severity of her HF. Now DNR/DNI. Will continue to follow closely. Will change home torsemide to bumex 4 bid.   Glori Bickers, MD  9:36 AM

## 2017-12-27 NOTE — Care Management Note (Addendum)
Case Management Note  Patient Details  Name: Nina Howell MRN: [id number] Date of Birth: [birth year]  Subjective/Objective:  77 y.o. female with a history of combined systolic and diastolic CHF, obstructive sleep apnea, morbid obesity, diabetes mellitus, COPD and asthma who presents with complaints of recent weight gain and worsening lower extremity edema. Patient lives at home with spouse, with assistance with patient's ADLs being provided by spouse, daughter and [facility] home health aide [name]). Patient verbalized being active with [employer] for home CP assess and management, and the HF Paramedicine Program. Patient verbalized having a personal relationship with [name] to assist with community transportation. DME: walker, RW, bariatric BSC, sliding bench and home O2/bipap being provided by [facility]. Patient was active with [name] for HHRN/PT/SW, and the [name] program for assistance with home modifications and monthly CM visits. [facility]acy: [name].                Action/Plan: CM met with patient to assess for post hospital transitional needs. CM  informed by [name] of their inability to continue to provide [name] service to patient, d/t patient's steady decline and need for an increased LOC, which [name] currently can't provide at this time, with patient updated by CM and requesting a HHRN to follow for wound care needs. CM discussed SNF options post transition, with patient declining and opting to transition home with her previous resources. Patient indicated her spouse and daughter both work together to provide adequate assistance with her meal preparation, household duties, and patient's bathing/dressing. Patient verbalized her daughter would contact [name] to arrange patient's transportation home. CM will continue to follow for transitional needs.   Expected Discharge Date:                  Expected Discharge Plan:   [place]  In-House Referral:  NA  Discharge planning Services  CM Consult  Post Acute Care Choice:  NA Choice offered to:  NA  DME Arranged:  N/A DME Agency:  NA  HH Arranged:  Refused SNF [place] Agency:  NA  Status of Service:  In process, will continue to follow  If discussed at Long Length of Stay Meetings, dates discussed:    Additional Comments:  [name], RN 12/27/2017, 4:30 PM

## 2017-12-27 NOTE — Progress Notes (Signed)
PT Cancellation Note  Patient Details Name: Nina Howell MRN: [id number] DOB: 06-Jun-1940   Cancelled Treatment:    Reason Eval/Treat Not Completed: Patient declined, no reason specified; spent time discussing with patient her reasons for not participating much with therapy.  She has her own way of doing things at home and, in her mind, will still be able to do things upon returning home despite my concern for the length of time she has laid in the bed in the hospital.  Spoke with Ander Purpura, RN, who assisted [name] to the [facility] this morning and reports she was able to take a couple steps to the [facility] with some assistance, but that she continually stated she could not do it.  I fear she is weaker and needing more assistance and that she is significantly fearful of falling.  I hope what she tells me is true that there will be family and friends in to help her throughout the day at home and that her personal relationship with the EMS in her community will allow frequent help to get up and down her steps at home.  She refuses any thought of SNF rehab.  Will continue attempts if she will allow it.    Reginia Naas 12/27/2017, 10:45 AM  Magda Kiel, PT [phone] 12/27/2017

## 2017-12-28 ENCOUNTER — Other Ambulatory Visit: Payer: Self-pay | Admitting: Physician Assistant

## 2017-12-28 ENCOUNTER — Encounter (HOSPITAL_COMMUNITY): Payer: Self-pay | Admitting: Physician Assistant

## 2017-12-28 DIAGNOSIS — N183 Chronic kidney disease, stage 3 unspecified: Secondary | ICD-10-CM

## 2017-12-28 DIAGNOSIS — I5082 Biventricular heart failure: Secondary | ICD-10-CM

## 2017-12-28 DIAGNOSIS — Z66 Do not resuscitate: Secondary | ICD-10-CM

## 2017-12-28 LAB — BASIC METABOLIC PANEL
Anion gap: 12 (ref 5–15)
BUN: 25 mg/dL — AB (ref 8–23)
CO2: 32 mmol/L (ref 22–32)
Calcium: 8.9 mg/dL (ref 8.9–10.3)
Chloride: 93 mmol/L — ABNORMAL LOW (ref 98–111)
Creatinine, Ser: 1.43 mg/dL — ABNORMAL HIGH (ref 0.44–1.00)
GFR calc Af Amer: 40 mL/min — ABNORMAL LOW (ref >60)
GFR, EST NON AFRICAN AMERICAN: 35 mL/min — AB (ref >60)
GLUCOSE: 160 mg/dL — AB (ref 70–99)
POTASSIUM: 3.5 mmol/L (ref 3.5–5.1)
Sodium: 137 mmol/L (ref 135–145)

## 2017-12-28 LAB — COOXEMETRY PANEL
Carboxyhemoglobin: 1.5 % (ref 0.5–1.5)
METHEMOGLOBIN: 1.1 % (ref 0.0–1.5)
O2 Saturation: 44.9 %
TOTAL HEMOGLOBIN: 9.9 g/dL — AB (ref 12.0–16.0)

## 2017-12-28 LAB — GLUCOSE, CAPILLARY
Glucose-Capillary: 137 mg/dL — ABNORMAL HIGH (ref 70–99)
Glucose-Capillary: 175 mg/dL — ABNORMAL HIGH (ref 70–99)
Glucose-Capillary: 257 mg/dL — ABNORMAL HIGH (ref 70–99)

## 2017-12-28 MED ORDER — METOLAZONE 2.5 MG PO TABS: 2.5000 mg | ORAL_TABLET | ORAL | 11 refills | Status: AC

## 2017-12-28 MED ORDER — BUMETANIDE 2 MG PO TABS: 4.0000 mg | ORAL_TABLET | Freq: Two times a day (BID) | ORAL | 3 refills | Status: AC

## 2017-12-28 NOTE — Discharge Summary (Addendum)
Advanced Heart Failure Discharge Summary    Patient ID: Nina Howell,  MRN: [id number], DOB/AGE: March 26, 1941 77 y.o.  Admit date: 12/22/2017 Discharge date: 12/28/2017  Primary Care Provider: Leeroy Cha Primary Cardiologist (CHF Clinic): Glori Bickers, MD  Discharge Diagnoses    Principal Problem:   End Stage Biventricular heart failure ([place]) Active Problems:   DNR (do not resuscitate)   Obesity hypoventilation syndrome ([place])   Pulmonary hypertension, unspecified ([place])   CKD (chronic kidney disease) stage 3, GFR 30-59 ml/min (HCC)   Type 2 diabetes mellitus with complication, with long-term current use of insulin (HCC)   HYPOKALEMIA   Morbid obesity ([place])   Obstructive sleep apnea   Allergies Allergies  Allergen Reactions  . Celebrex [Celecoxib] Swelling  . Penicillins Rash    Has patient had a PCN reaction causing immediate rash, facial/tongue/throat swelling, SOB or lightheadedness with hypotension: Yes Has patient had a PCN reaction causing severe rash involving mucus membranes or skin necrosis: Yes Has patient had a PCN reaction that required hospitalization: No Has patient had a PCN reaction occurring within the last 10 years: Yes - Okay taking other cillin's If all of the above answers are "NO", then may proceed with Cephalosporin use.   . Cephalexin Hives  . Lasix [Furosemide] Hives  . Oxycodone-Acetaminophen Nausea Only  . Cephalosporins Rash  . Oxycodone Anxiety    Felt weird    Diagnostic Studies/Procedures    Echocardiogram 12/24/17 - Left ventricle: The cavity size was severely dilated. Wall   thickness was normal. Systolic function was severely reduced. The   estimated ejection fraction was in the range of 10% to 15%.   Diffuse hypokinesis. Doppler parameters are consistent with   abnormal left ventricular relaxation (grade 1 diastolic   dysfunction). - Aortic valve: There was mild regurgitation. - Mitral valve: There was mild  regurgitation directed posteriorly. - Left atrium: The atrium was mildly dilated. - Right ventricle: The cavity size was moderately dilated. Wall   thickness was normal. Systolic function was moderately reduced. - Tricuspid valve: There was severe regurgitation.  R and L Cardiac Catheterization 12/25/17 Findings:  Ao = 87/59 (69) LV = 92/26 RA = 15 RV = 44/18 PA = 46/24 (31) PCW = 18 Fick cardiac output/index = 3.4/1.5 Thermo CO/CI 3.3/1.5 PVR = 4.0 WU SVR = 1286 Ao sat = 99% PA sat = 34%, 35% RA/PCWP = 0.83 PaPI = 1.5 CPO = 0.52  Assessment: 1. 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 Elevated filling pressures with markedly low cardiac output c/w shock physiology   Plan/Discussion:  She has severe biventricular HF due to NICM. Options very limited givine size and immobility. Likely not candidate for home inotropes with comorbidities.  _____________   History of Present Illness     Nina Howell is a 77 y.o. female with combined systolic and diastolic congestive heart failure, pulmonary HTN with RV failure, obstructive sleep apnea, morbid obesity, obesity hypoventilation syndrome, diabetes, COPD and asthma, bullous pemphigoid.  She has had several admissions to the hospital with decompensated congestive heart failure.  She presented to the hospital on 12/22/2017 with a 10 lb weight gain, worsening lower extremity swelling and hypotension.     Hospital Course     Consultants: none    END STAGE BIVENTRICULAR HEART FAILURE She has a hx of allergy to Furosemide and is on Torsemide and is on prn  Metolazone at home.  She was transitioned to Bumetanide for inpatient diuresis.  Unna boots were applied to assist with management of lower extremity swelling.  An echocardiogram demonstrated severely reduced LV function with ejection fraction 81-44%, mild diastolic dysfunction,  and moderately reduced RV function.  Right and left heart catheterization was arranged.  This demonstrated normal coronary arteries (anomalous L coronary circulation with LM coming off R coronary cusp superior to RCA origin) and elevated filling pressures with markedly low CO (CO Fick 3.4) consistent with shock physiology.  Options for treating her congestive heart failure were felt to be limited due to morbid obesity and decreased mobility.  Dr. Haroldine Laws discussed her poor prognosis (life expectancy < 1 year) with the patient.  She is not a candidate for home inotropic therapy or advanced therapies.  She decided to change her status to DNR/DNI.   She diuresed 5.8 L.  She was seen by Dr. Glori Bickers this AM.  It is felt that she has diuresed as much as possible and is ready for discharge.  SNF was recommended, but the patient declined as she has help at home.  She will need close follow up as an outpatient.  Her home Torsemide was changed to Bumetanide 4 mg Twice daily.  She will take Metolazone as needed for weight gain >/= 3 lbs.    PULMONARY HYPERTENSION WITH RV FAILURE/COR PULMONALE/CARDIAC CIRRHOSIS She has longstanding pulmonary arterial hypertension due to obesity hypoventilation syndrome (WHO group 3).  Mean PA 31 by [facility] this admission.  She remains on continuous O2 and is followed by pulmonology as an outpatient.    HYPOKALEMIA Potassium was replaced and she is DC to home on KDur 40 mEq Twice daily and Spironolactone 25 mg Once daily.  She will need a BMET in 1 week.  CHRONIC KIDNEY DISEASE STAGE 3 Her creatinine remained fairly stable this admission and was 1.43 at DC.  She will need a BMET in 1 week to follow up on renal function and potassium.    DIABETES Hgb A1c was 11 this admission.  She was continued on Insulin and will follow up with primary care as an outpatient.   DNR/DNI As noted, the patient's long term prognosis is poor.  She opted to be DNR/DNI.  As she does not have  coronary artery disease on Cardiac Catheterization, ASA and Statin were both discontinued.   _____________  Discharge Vitals Blood pressure 100/65, pulse 80, temperature 98.1 F (36.7 C), temperature source Oral, resp. rate 20, height 5\' 1"  (1.549 m), weight 131.5 kg, SpO2 100 %.  Filed Weights   12/25/17 0018 12/27/17 0411 12/28/17 0556  Weight: 131.5 kg 129.4 kg 131.5 kg    Labs & Radiologic Studies    CBC Recent Labs    12/26/17 0322  WBC 6.6  HGB 9.8*  HCT 31.9*  MCV 86.9  PLT 818   Basic Metabolic Panel Recent Labs    12/27/17 0422 12/28/17 0400  NA 137 137  K 3.1* 3.5  CL 92* 93*  CO2 33* 32  GLUCOSE 141* 160*  BUN 23 25*  CREATININE 1.26* 1.43*  CALCIUM 9.0 8.9   _____________  Dg Chest 2 View  Result Date: 12/22/2017 CLINICAL DATA:  Shortness of breath EXAM: CHEST - 2 VIEW COMPARISON:  11/10/2017 FINDINGS: Lungs are clear.  No pleural effusion or pneumothorax. Stable severe cardiomegaly. Degenerative changes of the visualized thoracolumbar spine. IMPRESSION: No evidence of acute cardiopulmonary disease. Stable severe cardiomegaly. Electronically Signed   By: Bertis Ruddy  [place] Pink M.D.   On: 12/22/2017 18:51   Disposition   Pt is being discharged home today in good condition.  Follow-up Plans & Appointments    Follow-up Information    [facility] Follow up on 01/08/2018.   Specialty:  Cardiology Why:  at 1130 for post hospital follow up. Code for parking is 1500. (we will try to get you in sooner - the clinic will call if we can)  We will also arrange a lab test this week (BMET) Contact information: [address] [id number] [place]         Discharge Instructions    ([place]) Call MD:  Anytime you have any of the following symptoms: 1) 3 pound weight gain in 24 hours or 5 pounds in 1 week 2) shortness of breath, with or without a dry hacking  cough 3) swelling in the hands, feet or stomach 4) if you have to sleep on extra pillows at night in order to breathe.   Complete by:  As directed    Diet - low sodium heart healthy   Complete by:  As directed    Diet Carb Modified   Complete by:  As directed    Increase activity slowly   Complete by:  As directed       Discharge Medications   Allergies as of 12/28/2017      Reactions   Celebrex [celecoxib] Swelling   Penicillins Rash   Has patient had a PCN reaction causing immediate rash, facial/tongue/throat swelling, SOB or lightheadedness with hypotension: Yes Has patient had a PCN reaction causing severe rash involving mucus membranes or skin necrosis: Yes Has patient had a PCN reaction that required hospitalization: No Has patient had a PCN reaction occurring within the last 10 years: Yes - Okay taking other cillin's If all of the above answers are "NO", then may proceed with Cephalosporin use.   Cephalexin Hives   Lasix [furosemide] Hives   Oxycodone-acetaminophen Nausea Only   Cephalosporins Rash   Oxycodone Anxiety   Felt weird      Medication List    STOP taking these medications   aspirin EC 81 MG tablet   atorvastatin 20 MG tablet Commonly known as:  LIPITOR   niacinamide 500 MG tablet   torsemide 100 MG tablet Commonly known as:  DEMADEX   torsemide 20 MG tablet Commonly known as:  DEMADEX     TAKE these medications   acetaminophen 500 MG tablet Commonly known as:  TYLENOL Take 1,000 mg every 8 (eight) hours as needed by mouth for mild pain, moderate pain, fever or headache.   ALPRAZolam 0.5 MG tablet Commonly known as:  XANAX Take 0.25 mg by mouth at bedtime as needed for anxiety.   bumetanide 2 MG tablet Commonly known as:  BUMEX Take 2 tablets (4 mg total) by mouth 2 (two) times daily.   cholecalciferol 1000 units tablet Commonly known as:  VITAMIN D Take 1,000 Units by mouth daily with lunch.   clobetasol ointment 0.05 % Commonly known  as:  TEMOVATE Apply 1 application topically 2 (two) times daily.   Cranberry-Vitamin C-Vitamin E 4200-20-3 MG-MG-UNIT Caps Take 4 capsules by mouth daily with lunch.   CREON 36000 UNITS Cpep capsule Generic drug:  lipase/protease/amylase Take 76,195-09,326 Units by mouth See admin instructions. Take 2 capsules (72,000 units) by mouth up to 3 times daily with meals, take 1 capsule (36,000 units) with snacks  doxycycline 100 MG tablet Commonly known as:  VIBRA-TABS Take 100 mg by mouth 2 (two) times daily.   fluticasone furoate-vilanterol 200-25 MCG/INH Aepb Commonly known as:  BREO ELLIPTA Inhale 1 puff into the lungs daily.   guaiFENesin 600 MG 12 hr tablet Commonly known as:  MUCINEX Take 1,200 mg by mouth 2 (two) times daily as needed for cough or to loosen phlegm. Started 10/30   insulin NPH-regular Human (70-30) 100 UNIT/ML injection Commonly known as:  NOVOLIN 70/30 Inject 16-26 Units into the skin See admin instructions. Inject 26 units subcutaneously daily after breakfast and 16 units after supper   levothyroxine 50 MCG tablet Commonly known as:  SYNTHROID, LEVOTHROID Take 50 mcg by mouth daily before breakfast.   lidocaine 5 % ointment Commonly known as:  XYLOCAINE Apply 1 application 2 (two) times daily topically.   metolazone 2.5 MG tablet Commonly known as:  ZAROXOLYN Take 1 tablet (2.5 mg total) by mouth See admin instructions. Take 1 tablet daily as needed for weight gain 3 lbs or more. What changed:  additional instructions   multivitamin with minerals Tabs tablet Take 0.5 tablets by mouth daily. Centrum Silver   OXYGEN Inhale 4 L into the lungs continuous.   polyethylene glycol packet Commonly known as:  MIRALAX / GLYCOLAX Take 17 g by mouth daily as needed.   potassium chloride SA 20 MEQ tablet Commonly known as:  K-DUR,KLOR-CON Take 2 tablets (40 mEq total) by mouth 2 (two) times daily. What changed:    how much to take  when to take this     PROAIR HFA 108 (90 Base) MCG/ACT inhaler Generic drug:  albuterol INHALE TWO PUFFS BY MOUTH EVERY 4 HOURS AS NEEDED FOR WHEEZING OR FOR SHORTNESS OF BREATH   sertraline 50 MG tablet Commonly known as:  ZOLOFT Take 50 mg by mouth at bedtime.   spironolactone 25 MG tablet Commonly known as:  ALDACTONE Take 1 tablet (25 mg total) by mouth daily with lunch. Please call [phone] to schedule appointment for additional refills thanks.   Vitamin B-12 2500 MCG Subl Place 2,500 mcg under the tongue daily with lunch.        Acute coronary syndrome (MI, NSTEMI, STEMI, etc) this admission?: No.    Outstanding Labs/Studies    BMET 1 week  Duration of Discharge Encounter   Greater than 30 minutes including physician time.  Signed, Kinnison Dopp, PA-C 12/28/2017, 10:19 AM  Patient seen and examined with the above-signed Advanced Practice Provider and/or Housestaff. I personally reviewed laboratory data, imaging studies and relevant notes. I independently examined the patient and formulated the important aspects of the plan. I have edited the note to reflect any of my changes or salient points. I have personally discussed the plan with the patient and/or family.  She is ready for d/c. See my rounding note for further details. She has end-stage HF. Refuses SNF. Will follow in HF Clinic.   Glori Bickers, MD  11:57 AM

## 2017-12-28 NOTE — Plan of Care (Signed)
  Problem: Education: Goal: Knowledge of General Education information will improve Description Including pain rating scale, medication(s)/side effects and non-pharmacologic comfort measures Outcome: Progressing   Problem: Clinical Measurements: Goal: Will remain free from infection Outcome: Progressing   Problem: Skin Integrity: Goal: Risk for impaired skin integrity will decrease Outcome: Progressing   Problem: Elimination: Goal: Will not experience complications related to bowel motility Outcome: Completed/Met Goal: Will not experience complications related to urinary retention Outcome: Completed/Met

## 2017-12-28 NOTE — Care Management Note (Signed)
Case Management Note Previous CM note completed by Nina Lea, RN 12/27/2017, 4:30 PM   Patient Details  Name: Nina Howell MRN: [id number] Date of Birth: 03/09/1941  Subjective/Objective:  77 y.o. female with a history of combined systolic and diastolic CHF, obstructive sleep apnea, morbid obesity, diabetes mellitus, COPD and asthma who presents with complaints of recent weight gain and worsening lower extremity edema. Patient lives at home with spouse, with assistance with patient's ADLs being provided by spouse, daughter and [facility] home health aide [facility]). Patient verbalized being active with [employer] for home CP assess and management, and the HF Paramedicine Program. Patient verbalized having a personal relationship with [facility] to assist with community transportation. DME: walker, RW, bariatric BSC, sliding bench and home O2/bipap being provided by [facility]. Patient was active with [facility] for HHRN/PT/SW, and the [facility] program for assistance with home modifications and monthly CM visits. [facility]: [name].                Action/Plan: CM met with patient to assess for post hospital transitional needs. CM  informed by [facility] of their inability to continue to provide [facility] service to patient, d/t patient's steady decline and need for an increased LOC, which [name] currently can't provide at this time, with patient updated by CM and requesting a HHRN to follow for wound care needs. CM discussed SNF options post transition, with patient declining and opting to transition home with her previous resources. Patient indicated her spouse and daughter both work together to provide adequate assistance with her meal preparation, household duties, and patient's bathing/dressing. Patient verbalized her daughter would contact [facility] to arrange patient's transportation home. CM will continue to follow for transitional needs.    Expected Discharge Date:  12/28/17               Expected Discharge Plan:  [place]  In-House Referral:  NA  Discharge planning Services  CM Consult  Post Acute Care Choice:  Home Health Choice offered to:  Patient, Adult Children  DME Arranged:  N/A DME Agency:  NA  HH Arranged:  Refused SNF, RN, PT HH Agency:  [place]  Status of Service:  Completed, signed off  If discussed at [place] of Stay Meetings, dates discussed:    Discharge Disposition: home/home health   Additional Comments:  12/28/17- 1645- Nina Thoma RN, CM- pt for discharge today with orders for HHRN/PT- spoke with [facility] this am regarding referral however they were unable to accept pt due to staffing issues. Spoke with pt and daughter again at bedside for choice of other agencies- and will contact agencies in area for staffing and insurance network- CM has contact multiple [place] agencies through out the day without success- Call made to [facility] at 1420- and at 1630 received call that they have accepted referral for [facility] services. Bedside RN aware and CM spoke with pt and daughter at bedside to inform them what agency has accepted pt and will provide services. Start of care will be either 8/13 or 8/14. Pt will d/c with unna boots. Per pt and daughter pt will d/c with daughter then call EMS once home for assistance getting up the stairs- no DME needs noted. Pt has f/u with HF clinic on 8/21   Nina Howell [place], [place] 12/28/2017, 4:45 PM [phone]

## 2017-12-28 NOTE — Progress Notes (Signed)
Advanced Heart Failure Rounding Note  PCP-Cardiologist: Dr. Haroldine Laws   Subjective:    Remains on bumex 4 mg BID. Cr slightly up, 1.07 -> 1.2-> 1.4. K 3.5.  Weight up 2 pounds  Coox 44.9% this am.   Feels weak but says she is ready to go home. Refuses SNF. No SOB, orthopnea or PND.     Echo 12/24/17: LVEF 10-15%, Grade 1 DD, MIld AI, Mild MR, Mild LAE, Mod RV dilation, Mod RV reduction, severe TR.   R/LHC 12/25/17 Ao = 87/59 (69) LV = 92/26 RA = 15 RV = 44/18 PA = 46/24 (31) PCW = 18 Fick cardiac output/index = 3.4/1.5 Thermo CO/CI 3.3/1.5 PVR = 4.0 WU SVR = 1286 Ao sat = 99% PA sat = 34%, 35% RA/PCWP = 0.83 PaPI = 1.5 CPO = 0.52  Assessment: 1. 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 Elevated filling pressures with markedly low cardiac output c/w shock physiology   Objective:   Weight Range: 131.5 kg Body mass index is 54.78 kg/m.   Vital Signs:   Temp:  [97.6 F (36.4 C)-98.5 F (36.9 C)] 98 F (36.7 C) (08/09 2247) Pulse Rate:  [78-97] 78 (08/10 0442) Resp:  [15-20] 18 (08/10 0442) BP: (94-117)/(58-79) 94/59 (08/09 2247) SpO2:  [96 %-100 %] 100 % (08/10 0442) FiO2 (%):  [35 %] 35 % (08/10 0442) Weight:  [131.5 kg] 131.5 kg (08/10 0556) Last BM Date: 12/27/17  Weight change: Filed Weights   12/25/17 0018 12/27/17 0411 12/28/17 0556  Weight: 131.5 kg 129.4 kg 131.5 kg   Intake/Output:   Intake/Output Summary (Last 24 hours) at 12/28/2017 0755 Last data filed at 12/28/2017 0556 Gross per 24 hour  Intake 450 ml  Output 1750 ml  Net -1300 ml     Physical Exam   CVP 8 lying in bed.  General:  Well appearing. No resp difficulty HEENT: normal Neck: supple. RIJ TLC. Carotids 2+ bilat; no bruits. No lymphadenopathy or thryomegaly appreciated. Cor: PMI laterally displaceddisplaced. Regular rate & rhythm. s3 2/6 TR Lungs: clear Abdomen: obese  soft, nontender, nondistended. No hepatosplenomegaly. No bruits or masses. Good bowel sounds. Extremities: no cyanosis, clubbing, rash, tr edema + bullous lesions  Neuro: alert & orientedx3, cranial nerves grossly intact. moves all 4 extremities w/o difficulty. Affect pleasant    Telemetry   NSR 70-80s, 1st degree AV block, personally reviewed.   EKG    No new tracings.   Labs    CBC Recent Labs    12/26/17 0322  WBC 6.6  HGB 9.8*  HCT 31.9*  MCV 86.9  PLT 570   Basic Metabolic Panel Recent Labs    12/27/17 0422 12/28/17 0400  NA 137 137  K 3.1* 3.5  CL 92* 93*  CO2 33* 32  GLUCOSE 141* 160*  BUN 23 25*  CREATININE 1.26* 1.43*  CALCIUM 9.0 8.9   Liver Function Tests No results for input(s): AST, ALT, ALKPHOS, BILITOT, PROT, ALBUMIN in the last 72 hours. No results for input(s): LIPASE, AMYLASE in the last 72 hours. Cardiac Enzymes No results for input(s): CKTOTAL, CKMB, CKMBINDEX, TROPONINI in the last 72 hours.  BNP: BNP (last 3 results) Recent Labs    11/10/17 2302 12/16/17 0943 12/22/17 1817  BNP 2,661.5* 1,895.1* 1,645.6*    ProBNP (last 3 results) No results for input(s): PROBNP in the last 8760 hours.   D-Dimer No  results for input(s): DDIMER in the last 72 hours. Hemoglobin A1C No results for input(s): HGBA1C in the last 72 hours. Fasting Lipid Panel No results for input(s): CHOL, HDL, LDLCALC, TRIG, CHOLHDL, LDLDIRECT in the last 72 hours. Thyroid Function Tests No results for input(s): TSH, T4TOTAL, T3FREE, THYROIDAB in the last 72 hours.  Invalid input(s): FREET3  Other results:   Imaging    No results found.   Medications:     Scheduled Medications: . aspirin EC  81 mg Oral QHS  . atorvastatin  20 mg Oral q1800  . bumetanide  4 mg Oral BID  . enoxaparin (LOVENOX) injection  40 mg Subcutaneous Q24H  . insulin aspart  0-15 Units Subcutaneous TID WC  . insulin aspart  0-5 Units Subcutaneous QHS  . mouth rinse  15 mL  Mouth Rinse BID  . potassium chloride  40 mEq Oral BID  . sodium chloride flush  3 mL Intravenous Q12H  . sodium chloride flush  3 mL Intravenous Q12H  . spironolactone  25 mg Oral Daily    Infusions: . sodium chloride    . sodium chloride      PRN Medications: sodium chloride, sodium chloride, acetaminophen, ALPRAZolam, docusate sodium, ondansetron (ZOFRAN) IV, senna, sodium chloride flush, sodium chloride flush, zolpidem  Patient Profile   Nina Howell is a 77 y.o. female with a history of combined systolic and diastolic CHF, obstructive sleep apnea, morbid obesity, diabetes mellitus, COPD and asthma.  Admitted with weight gain and BLE edema.   Assessment/Plan   1. Acute on chronic biventricular heart failure  - Echo 12/24/17: LVEF 10-15%, Grade 1 DD, MIld AI, Mild MR, Mild LAE, Mod RV dilation, Mod RV reduction, severe TR. - NICM by cath. RHC with biventricular HF and elevated filling pressures.  - Coox 45%  - Volume status about as good as we can geti  - Continue bumex 4 mg BID  - Continue spiro 25 mg daily - Followed by HF paramedicine at home 2. 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  - PA pressures 46/24 (31) by cath 12/25/17 - VQ scan was indeterminate but Chest CT not suggestive of chronic PE, LE dopplers negative.  - ANA 1:80 Positive, RF, P-ANCA negative,  ESR 35. Likely not clinically significant.  - Follows with Dr. Elsworth Soho outpatient - Continue home O2. No change.  3. Morbid obesity - Body mass index is 54.78 kg/m. 4. Obesity hypoventilation syndrome - Continue home oxygen. No change.  5. OSA on BiPAP - Continue BiPAP qHS. No change.  6. Bullous pemphigoid - Followed by Dermatology.  No recent steroids. No change.   7. Deconditioning - PT/OT eval. Refuses SNF  10. Hypokalemia - K 3.5 - Continue K 40 meq BID with extra 40 meq this am. (Refuses K solution) - Cont spiro 25 mg  daily 11. Disposition - Pt is now DNR/DNI. - She refuses SNF  PT recommends HHPT.    She has end-stage biventricular HF. Not candidate for advanced therapies or home inotropes. I think she is about as good as we can get her. I have recommended SNF but she wants to go home as she has help at home. Long discussion with her and family about severity of her HF. Now DNR/DNI. Will continue to follow closely. Will change home torsemide to bumex 4 bid.   Home today. Continue home meds with switch of torsemide to Bumex. Can stop ASA  and atorvastatin with no CAD. Use metolazone PRN for weight gain 3 pounds.   Length of Stay: Harper, MD  12/28/2017, 7:55 AM  Advanced Heart Failure Team Pager [phone] (M-F; 7a - 4p)  Please contact [facility] for night-coverage after hours (4p -7a ) and weekends on [email or web address]

## 2017-12-28 NOTE — Progress Notes (Signed)
D/c instructions given to pt and daughter. IJ removed, clean and intact. Telemetry removed. All questions answered. Daughter to escort pt home.  Clyde Canterbury, RN

## 2017-12-30 ENCOUNTER — Telehealth: Payer: Self-pay

## 2017-12-30 NOTE — Telephone Encounter (Signed)
Notes on file, fax sent to scheduling.

## 2017-12-31 DIAGNOSIS — G4733 Obstructive sleep apnea (adult) (pediatric): Secondary | ICD-10-CM | POA: Diagnosis not present

## 2017-12-31 DIAGNOSIS — E662 Morbid (severe) obesity with alveolar hypoventilation: Secondary | ICD-10-CM | POA: Diagnosis not present

## 2017-12-31 DIAGNOSIS — E1122 Type 2 diabetes mellitus with diabetic chronic kidney disease: Secondary | ICD-10-CM | POA: Diagnosis not present

## 2017-12-31 DIAGNOSIS — Z7951 Long term (current) use of inhaled steroids: Secondary | ICD-10-CM | POA: Diagnosis not present

## 2017-12-31 DIAGNOSIS — Z6841 Body Mass Index (BMI) 40.0 and over, adult: Secondary | ICD-10-CM | POA: Diagnosis not present

## 2017-12-31 DIAGNOSIS — Z4800 Encounter for change or removal of nonsurgical wound dressing: Secondary | ICD-10-CM | POA: Diagnosis not present

## 2017-12-31 DIAGNOSIS — L120 Bullous pemphigoid: Secondary | ICD-10-CM | POA: Diagnosis not present

## 2017-12-31 DIAGNOSIS — Z794 Long term (current) use of insulin: Secondary | ICD-10-CM | POA: Diagnosis not present

## 2017-12-31 DIAGNOSIS — I5042 Chronic combined systolic (congestive) and diastolic (congestive) heart failure: Secondary | ICD-10-CM | POA: Diagnosis not present

## 2017-12-31 DIAGNOSIS — Z9981 Dependence on supplemental oxygen: Secondary | ICD-10-CM | POA: Diagnosis not present

## 2017-12-31 DIAGNOSIS — J449 Chronic obstructive pulmonary disease, unspecified: Secondary | ICD-10-CM | POA: Diagnosis not present

## 2017-12-31 DIAGNOSIS — I272 Pulmonary hypertension, unspecified: Secondary | ICD-10-CM | POA: Diagnosis not present

## 2017-12-31 DIAGNOSIS — N183 Chronic kidney disease, stage 3 (moderate): Secondary | ICD-10-CM | POA: Diagnosis not present

## 2018-01-01 ENCOUNTER — Other Ambulatory Visit (HOSPITAL_COMMUNITY): Payer: Self-pay

## 2018-01-01 ENCOUNTER — Other Ambulatory Visit: Payer: Self-pay

## 2018-01-01 ENCOUNTER — Other Ambulatory Visit: Payer: Self-pay | Admitting: Licensed Clinical Social Worker

## 2018-01-01 NOTE — Patient Outreach (Signed)
[facility]) Care Management  01/01/2018  Ammara Raj [birth year] [id number]  Assessment-CSW completed first outreach attempt today. CSW unable to reach patient successfully. CSW left a HIPPA compliant voice message encouraging patient to return call once available in order to discuss referral to [employer].   Plan-CSW will await return call or complete an additional outreach if needed.  Eula Fried, BSW, MSW, [email or web address] Phone: [phone] Fax: [phone]

## 2018-01-01 NOTE — Progress Notes (Signed)
Paramedicine Encounter    Patient ID: Nina Howell, female    DOB: 06/01/1940, 77 y.o.   MRN: [id number]   Patient Care Team: Leeroy Cha, MD as PCP - General (Internal Medicine) Bensimhon, Shaune Pascal, MD as PCP - Cardiology (Cardiology) Delrae Rend, MD as Consulting Physician (Internal Medicine) Luretha Rued, RN as [name], Athalia, LCSW as [place] Management (Licensed Clinical Social Worker)  Patient Active Problem List   Diagnosis Date Noted  . CKD (chronic kidney disease) stage 3, GFR 30-59 ml/min (HCC) 12/28/2017  . DNR (do not resuscitate) 12/28/2017  . Pressure injury of skin 11/11/2017  . Chronic respiratory failure with hypoxia ([place]) 04/03/2017  . Pericardial effusion 11/23/2015  . Chronic diastolic heart failure ([place]) 11/23/2015  . Pulmonary hypertension, unspecified ([place]) 09/06/2015  . Acute on chronic congestive heart failure ([place])   . Rash and nonspecific skin eruption 11/27/2014  . Post-menopausal bleeding 06/18/2011  . Varicose veins 04/25/2011  . Unspecified venous (peripheral) insufficiency 04/25/2011  . Hemangioma of liver, left side 03/23/2011  . HYPOTHYROIDISM 04/27/2010  . Type 2 diabetes mellitus with complication, with long-term current use of insulin ([place]) 04/27/2010  . HYPOKALEMIA 04/27/2010  . Morbid obesity ([place]) 04/27/2010  . DEPRESSION 04/27/2010  . Obstructive sleep apnea 04/27/2010  . Essential hypertension 04/27/2010  . End Stage Biventricular heart failure ([place]) 04/27/2010  . Obesity hypoventilation syndrome ([place]) 04/27/2010    Current Outpatient Medications:  .  acetaminophen (TYLENOL) 500 MG tablet, Take 1,000 mg every 8 (eight) hours as needed by mouth for mild pain, moderate pain, fever or headache., Disp: , Rfl:  .  ALPRAZolam (XANAX) 0.5 MG tablet, Take 0.25 mg by mouth at bedtime as needed for anxiety. , Disp: , Rfl:  .  bumetanide (BUMEX) 2 MG tablet,  Take 2 tablets (4 mg total) by mouth 2 (two) times daily., Disp: 360 tablet, Rfl: 3 .  cholecalciferol (VITAMIN D) 1000 units tablet, Take 1,000 Units by mouth daily with lunch. , Disp: , Rfl:  .  clobetasol ointment (TEMOVATE) 5.17 %, Apply 1 application topically 2 (two) times daily., Disp: , Rfl:  .  Cranberry-Vitamin C-Vitamin E 4200-20-3 MG-MG-UNIT CAPS, Take 4 capsules by mouth daily with lunch., Disp: , Rfl:  .  Cyanocobalamin (VITAMIN B-12) 2500 MCG SUBL, Place 2,500 mcg under the tongue daily with lunch. , Disp: , Rfl:  .  doxycycline (VIBRA-TABS) 100 MG tablet, Take 100 mg by mouth 2 (two) times daily., Disp: , Rfl:  .  fluticasone furoate-vilanterol (BREO ELLIPTA) 200-25 MCG/INH AEPB, Inhale 1 puff into the lungs daily., Disp: 1 each, Rfl: 3 .  guaiFENesin (MUCINEX) 600 MG 12 hr tablet, Take 1,200 mg by mouth 2 (two) times daily as needed for cough or to loosen phlegm. Started 10/30 , Disp: , Rfl:  .  insulin NPH-regular Human (NOVOLIN 70/30) (70-30) 100 UNIT/ML injection, Inject 16-26 Units into the skin See admin instructions. Inject 26 units subcutaneously daily after breakfast and 16 units after supper, Disp: , Rfl:  .  levothyroxine (SYNTHROID, LEVOTHROID) 50 MCG tablet, Take 50 mcg by mouth daily before breakfast. , Disp: , Rfl:  .  lidocaine (XYLOCAINE) 5 % ointment, Apply 1 application 2 (two) times daily topically. , Disp: , Rfl:  .  Multiple Vitamin (MULTIVITAMIN WITH MINERALS) TABS tablet, Take 0.5 tablets by mouth daily. Centrum Silver, Disp: , Rfl:  .  OXYGEN, Inhale 4 L into the lungs continuous., Disp: , Rfl:  .  potassium chloride SA (K-DUR,KLOR-CON) 20 MEQ tablet, Take 2 tablets (40 mEq total) by mouth 2 (two) times daily. (Patient taking differently: Take 20 mEq by mouth 3 (three) times daily. ), Disp: 120 tablet, Rfl: 11 .  PROAIR HFA 108 (90 Base) MCG/ACT inhaler, INHALE TWO PUFFS BY MOUTH EVERY 4 HOURS AS NEEDED FOR WHEEZING OR FOR SHORTNESS OF BREATH, Disp: 1 Inhaler,  Rfl: 2 .  sertraline (ZOLOFT) 50 MG tablet, Take 50 mg by mouth at bedtime. , Disp: , Rfl:  .  spironolactone (ALDACTONE) 25 MG tablet, Take 1 tablet (25 mg total) by mouth daily with lunch. Please call [phone] to schedule appointment for additional refills thanks., Disp: 30 tablet, Rfl: 5 .  lipase/protease/amylase (CREON) 36000 UNITS CPEP capsule, Take 62,947-65,465 Units by mouth See admin instructions. Take 2 capsules (72,000 units) by mouth up to 3 times daily with meals, take 1 capsule (36,000 units) with snacks, Disp: , Rfl:  .  metolazone (ZAROXOLYN) 2.5 MG tablet, Take 1 tablet (2.5 mg total) by mouth See admin instructions. Take 1 tablet daily as needed for weight gain 3 lbs or more. (Patient not taking: Reported on 01/01/2018), Disp: 30 tablet, Rfl: 11 .  polyethylene glycol (MIRALAX / GLYCOLAX) packet, Take 17 g by mouth daily as needed. (Patient not taking: Reported on 12/11/2017), Disp: 14 each, Rfl: 0 Allergies  Allergen Reactions  . Celebrex [Celecoxib] Swelling  . Penicillins Rash    Has patient had a PCN reaction causing immediate rash, facial/tongue/throat swelling, SOB or lightheadedness with hypotension: Yes Has patient had a PCN reaction causing severe rash involving mucus membranes or skin necrosis: Yes Has patient had a PCN reaction that required hospitalization: No Has patient had a PCN reaction occurring within the last 10 years: Yes - Okay taking other cillin's If all of the above answers are "NO", then may proceed with Cephalosporin use.   . Cephalexin Hives  . Lasix [Furosemide] Hives  . Oxycodone-Acetaminophen Nausea Only  . Cephalosporins Rash  . Oxycodone Anxiety    Felt weird      Social History   Socioeconomic History  . Marital status: Married    Spouse name: Not on file  . Number of children: 4  . Years of education: Not on file  . Highest education level: Not on file  Occupational History  . Occupation: retired    Fish farm manager: RETIRED     Comment: Education officer, [employer]  Social Needs  . Financial resource strain: Patient refused  . Food insecurity:    Worry: Patient refused    Inability: Patient refused  . Transportation needs:    Medical: Patient refused    Non-medical: Patient refused  Tobacco Use  . Smoking status: Never Smoker  . Smokeless tobacco: Never Used  Substance and Sexual Activity  . Alcohol use: No  . Drug use: No  . Sexual activity: Yes    Birth control/protection: IUD  Lifestyle  . Physical activity:    Days per week: Patient refused    Minutes per session: Patient refused  . Stress: Patient refused  Relationships  . Social connections:    Talks on phone: Patient refused    Gets together: Patient refused    Attends religious service: Patient refused    Active member of club or organization: Patient refused    Attends meetings of clubs or organizations: Patient refused    Relationship status: Patient refused  . Intimate partner violence:    Fear of current or ex partner: Patient refused  Emotionally abused: Patient refused    Physically abused: Patient refused    Forced sexual activity: Patient refused  Other Topics Concern  . Not on file  Social History Narrative  . Not on file    Physical Exam      Future Appointments  Date Time Provider [place]  01/08/2018 11:30 AM [facility] PA/NP [facility] None    BP 109/78   Pulse 82   Resp 15   Wt 280 lb (127 kg)   BMI 52.91 kg/m  CBG PTA-109 Monday-278 Weight yesterday-283 Last visit-281 prior to admission    Pt recently home from hospital admissin.  Pt reports she is doing better today, she has her arm bandaged up and now has home health coming to see her, her agency is [facility] [phone] are from [place].  She has unna boots placed on her now.  She began telling me her experience from her admission, she reports that she received poor prognosis but will continue to fight and try to feel better.  Pt had some med changes  and her pill box was updated and she did that on her own. She went to get her new rx of bumex but got there as pharmacy was closing and she couldn't get it picked up so she went a day without it so she had a weight gain but now has been taking it and her weight has been coming back down. She did take the metolazone 2.5 the other day when her weight was up when she didn't have the bumex.  She has not been taking the creon. She reports good BM at home, has been taking new rx of stool softner and has been feeling better.  Will see her next week at heart clinic appointment.     [name], [place] [facility] Paramedic  01/01/18

## 2018-01-01 NOTE — Patient Outreach (Signed)
[facility]) Care Management  01/01/2018  Nina Howell February 24, 1941 [id number]    77 year old with history of Heart failure diabetes, sleep apnea, obesity. Recent admisstion 12/22/17-12/28/17 with acute on chronic heart failure.  RNCM called to follow up. Client declines to talk at this time and states, "this is not a good day, you need to call me back'. She would not discuss anything further.  Noted per chart Nina Howell, paramedicine saw client today. And client is active with [facility] for home health.  Plan: RNCM will call within the next 3-4 business days,  Nina Howell, Therapist, sports, MSN, [place] Coordinator Cell: [phone]

## 2018-01-02 DIAGNOSIS — Z6841 Body Mass Index (BMI) 40.0 and over, adult: Secondary | ICD-10-CM | POA: Diagnosis not present

## 2018-01-02 DIAGNOSIS — Z4800 Encounter for change or removal of nonsurgical wound dressing: Secondary | ICD-10-CM | POA: Diagnosis not present

## 2018-01-02 DIAGNOSIS — Z794 Long term (current) use of insulin: Secondary | ICD-10-CM | POA: Diagnosis not present

## 2018-01-02 DIAGNOSIS — G4733 Obstructive sleep apnea (adult) (pediatric): Secondary | ICD-10-CM | POA: Diagnosis not present

## 2018-01-02 DIAGNOSIS — J449 Chronic obstructive pulmonary disease, unspecified: Secondary | ICD-10-CM | POA: Diagnosis not present

## 2018-01-02 DIAGNOSIS — I272 Pulmonary hypertension, unspecified: Secondary | ICD-10-CM | POA: Diagnosis not present

## 2018-01-02 DIAGNOSIS — I5042 Chronic combined systolic (congestive) and diastolic (congestive) heart failure: Secondary | ICD-10-CM | POA: Diagnosis not present

## 2018-01-02 DIAGNOSIS — N183 Chronic kidney disease, stage 3 (moderate): Secondary | ICD-10-CM | POA: Diagnosis not present

## 2018-01-02 DIAGNOSIS — L120 Bullous pemphigoid: Secondary | ICD-10-CM | POA: Diagnosis not present

## 2018-01-02 DIAGNOSIS — E1122 Type 2 diabetes mellitus with diabetic chronic kidney disease: Secondary | ICD-10-CM | POA: Diagnosis not present

## 2018-01-02 DIAGNOSIS — Z7951 Long term (current) use of inhaled steroids: Secondary | ICD-10-CM | POA: Diagnosis not present

## 2018-01-02 DIAGNOSIS — E662 Morbid (severe) obesity with alveolar hypoventilation: Secondary | ICD-10-CM | POA: Diagnosis not present

## 2018-01-02 DIAGNOSIS — Z9981 Dependence on supplemental oxygen: Secondary | ICD-10-CM | POA: Diagnosis not present

## 2018-01-03 ENCOUNTER — Other Ambulatory Visit: Payer: Self-pay | Admitting: Licensed Clinical Social Worker

## 2018-01-03 DIAGNOSIS — L120 Bullous pemphigoid: Secondary | ICD-10-CM | POA: Diagnosis not present

## 2018-01-03 DIAGNOSIS — Z9981 Dependence on supplemental oxygen: Secondary | ICD-10-CM | POA: Diagnosis not present

## 2018-01-03 DIAGNOSIS — I272 Pulmonary hypertension, unspecified: Secondary | ICD-10-CM | POA: Diagnosis not present

## 2018-01-03 DIAGNOSIS — E1122 Type 2 diabetes mellitus with diabetic chronic kidney disease: Secondary | ICD-10-CM | POA: Diagnosis not present

## 2018-01-03 DIAGNOSIS — E662 Morbid (severe) obesity with alveolar hypoventilation: Secondary | ICD-10-CM | POA: Diagnosis not present

## 2018-01-03 DIAGNOSIS — G4733 Obstructive sleep apnea (adult) (pediatric): Secondary | ICD-10-CM | POA: Diagnosis not present

## 2018-01-03 DIAGNOSIS — I5042 Chronic combined systolic (congestive) and diastolic (congestive) heart failure: Secondary | ICD-10-CM | POA: Diagnosis not present

## 2018-01-03 DIAGNOSIS — Z4800 Encounter for change or removal of nonsurgical wound dressing: Secondary | ICD-10-CM | POA: Diagnosis not present

## 2018-01-03 DIAGNOSIS — Z6841 Body Mass Index (BMI) 40.0 and over, adult: Secondary | ICD-10-CM | POA: Diagnosis not present

## 2018-01-03 DIAGNOSIS — J449 Chronic obstructive pulmonary disease, unspecified: Secondary | ICD-10-CM | POA: Diagnosis not present

## 2018-01-03 DIAGNOSIS — Z794 Long term (current) use of insulin: Secondary | ICD-10-CM | POA: Diagnosis not present

## 2018-01-03 DIAGNOSIS — Z7951 Long term (current) use of inhaled steroids: Secondary | ICD-10-CM | POA: Diagnosis not present

## 2018-01-03 DIAGNOSIS — N183 Chronic kidney disease, stage 3 (moderate): Secondary | ICD-10-CM | POA: Diagnosis not present

## 2018-01-03 NOTE — Patient Outreach (Signed)
[name]) Care Management  01/03/2018  Nina Howell 27-Feb-1941 [id number]  [facility] CSW received return call from patient on 01/03/18. HIPPA verifications provided. Patient reports that she DOES NOT own her residence and explained the situation and why her property was showing up the way it was. [facility] CSW informed patient that she will not qualify for [employer] due to this and would not be able to gain hand rails in her bathroom through this program. [facility] CSW discussed other community resource options for patient such as the [employer], Single-Family Rehabilitation Program ([place] [phone]) and the [employer] for [place] [phone]. Patient was receptive to [employer] education provided. Patient reports still being comfortable with her current transportation set up with EMS. Patient reports being familiar with most of the community resources available within the [facility] area. Patient agreeable to social work discharge at this time. [facility] CSW will update [facility] RNCM and PCP and will sign off at this time.  [facility] CM Care Plan Problem One     Most Recent Value  Care Plan Problem One  Need for additional community resource support and knowledge   Role Documenting the Problem One  Clinical Social Worker  Care Plan for Problem One  Not Active  [facility] CM Short Term Goal #1   Patient will successfully be referred to [employer] in order to gain hand rails in bathroom within 30 days  [facility] CM Short Term Goal #1 Start Date  12/12/17  [facility] CM Short Term Goal #1 Met Date  01/03/18  Interventions for Short Term Goal #1  Goal met. However, patient does not qualify for program assistance due to not owning her home. Resource education provided during outreach today for other options.     Eula Fried, BSW, MSW, [email or web address] Phone:  [phone] Fax: [phone]

## 2018-01-05 DIAGNOSIS — I5040 Unspecified combined systolic (congestive) and diastolic (congestive) heart failure: Secondary | ICD-10-CM | POA: Diagnosis not present

## 2018-01-06 ENCOUNTER — Other Ambulatory Visit: Payer: Self-pay

## 2018-01-06 NOTE — Patient Outreach (Signed)
[place] [facility]) Care Management  01/06/2018  Nina Howell Apr 11, 1941 [id number]   RNCM receive return call from client stating she was returning a call and added, "I really didn't want to be upset or what you are asking or anything today so that is what I need to tell you.   RNCM called paramedicine Katie, who has a good relationship with client. She reports that client was in good spirits last week and doing ok, when she saw her last week. She states home health is involved and doing wound care. Client has an IT trainer. Client continues to manage her medications.   RNCM discussed Care connections program with [name], she states she will bring it up to client.  Plan: Outreach letter sent. Will follow up next week.   [name], RN, MSN, [place] Coordinator Cell: [phone]

## 2018-01-06 NOTE — Patient Outreach (Signed)
[facility]) Care Management  01/06/2018  Nina Howell [birth year] [id number]  77 year old with history of Heart failure diabetes, sleep apnea, obesity. Recent admisstion 12/22/17-12/28/17 with acute on chronic heart failure. Active with Paramedicine.  RNCM called to follow up. Person answering phone states client is asleep. HIPPA compliant message left.  Plan: follow up telephonically in 3-4 days.  [name], RN, MSN, [place] Coordinator Cell: [phone]

## 2018-01-07 ENCOUNTER — Telehealth (HOSPITAL_COMMUNITY): Payer: Self-pay | Admitting: *Deleted

## 2018-01-07 DIAGNOSIS — E662 Morbid (severe) obesity with alveolar hypoventilation: Secondary | ICD-10-CM | POA: Diagnosis not present

## 2018-01-07 DIAGNOSIS — Z4800 Encounter for change or removal of nonsurgical wound dressing: Secondary | ICD-10-CM | POA: Diagnosis not present

## 2018-01-07 DIAGNOSIS — Z794 Long term (current) use of insulin: Secondary | ICD-10-CM | POA: Diagnosis not present

## 2018-01-07 DIAGNOSIS — Z7951 Long term (current) use of inhaled steroids: Secondary | ICD-10-CM | POA: Diagnosis not present

## 2018-01-07 DIAGNOSIS — I5042 Chronic combined systolic (congestive) and diastolic (congestive) heart failure: Secondary | ICD-10-CM | POA: Diagnosis not present

## 2018-01-07 DIAGNOSIS — Z9981 Dependence on supplemental oxygen: Secondary | ICD-10-CM | POA: Diagnosis not present

## 2018-01-07 DIAGNOSIS — L120 Bullous pemphigoid: Secondary | ICD-10-CM | POA: Diagnosis not present

## 2018-01-07 DIAGNOSIS — E1122 Type 2 diabetes mellitus with diabetic chronic kidney disease: Secondary | ICD-10-CM | POA: Diagnosis not present

## 2018-01-07 DIAGNOSIS — Z6841 Body Mass Index (BMI) 40.0 and over, adult: Secondary | ICD-10-CM | POA: Diagnosis not present

## 2018-01-07 DIAGNOSIS — N183 Chronic kidney disease, stage 3 (moderate): Secondary | ICD-10-CM | POA: Diagnosis not present

## 2018-01-07 DIAGNOSIS — J449 Chronic obstructive pulmonary disease, unspecified: Secondary | ICD-10-CM | POA: Diagnosis not present

## 2018-01-07 DIAGNOSIS — I272 Pulmonary hypertension, unspecified: Secondary | ICD-10-CM | POA: Diagnosis not present

## 2018-01-07 DIAGNOSIS — G4733 Obstructive sleep apnea (adult) (pediatric): Secondary | ICD-10-CM | POA: Diagnosis not present

## 2018-01-07 NOTE — Telephone Encounter (Signed)
HHRN called concerned about bandages on pt's legs, she states pt's legs are wrapped and pt told her they were wrapped about 2-3 weeks ago and there is a wound on her leg.  RN states she has no orders for wraps or wound care.  Gave VO to unwrap legs today and assess, pt will be seen in our office tomorrow and we will contact them for orders if pt needs unna boots/wraps for legs (as she does not have the supplies to do this today)  If she is concerned about wound when she assesses she will call us back.  Will send to [name], NP as she is seeing pt tomorrow.

## 2018-01-08 ENCOUNTER — Encounter (HOSPITAL_COMMUNITY): Payer: BLUE CROSS/BLUE SHIELD | Admitting: Internal Medicine

## 2018-01-08 ENCOUNTER — Other Ambulatory Visit (HOSPITAL_COMMUNITY): Payer: Self-pay

## 2018-01-08 ENCOUNTER — Ambulatory Visit (HOSPITAL_COMMUNITY)
Admit: 2018-01-08 | Discharge: 2018-01-08 | Disposition: A | Discharge status: Home / Self Care | Payer: BLUE CROSS/BLUE SHIELD | Attending: Internal Medicine | Admitting: Internal Medicine

## 2018-01-08 VITALS — BP 112/80 | HR 91 | Wt 280.0 lb

## 2018-01-08 DIAGNOSIS — I272 Pulmonary hypertension, unspecified: Secondary | ICD-10-CM | POA: Insufficient documentation

## 2018-01-08 DIAGNOSIS — Z79899 Other long term (current) drug therapy: Secondary | ICD-10-CM | POA: Insufficient documentation

## 2018-01-08 DIAGNOSIS — Z885 Allergy status to narcotic agent status: Secondary | ICD-10-CM | POA: Diagnosis not present

## 2018-01-08 DIAGNOSIS — I313 Pericardial effusion (noninflammatory): Secondary | ICD-10-CM | POA: Diagnosis not present

## 2018-01-08 DIAGNOSIS — E662 Morbid (severe) obesity with alveolar hypoventilation: Secondary | ICD-10-CM | POA: Diagnosis not present

## 2018-01-08 DIAGNOSIS — M1990 Unspecified osteoarthritis, unspecified site: Secondary | ICD-10-CM | POA: Diagnosis not present

## 2018-01-08 DIAGNOSIS — Z6841 Body Mass Index (BMI) 40.0 and over, adult: Secondary | ICD-10-CM | POA: Diagnosis not present

## 2018-01-08 DIAGNOSIS — J449 Chronic obstructive pulmonary disease, unspecified: Secondary | ICD-10-CM | POA: Insufficient documentation

## 2018-01-08 DIAGNOSIS — I429 Cardiomyopathy, unspecified: Secondary | ICD-10-CM | POA: Diagnosis not present

## 2018-01-08 DIAGNOSIS — E785 Hyperlipidemia, unspecified: Secondary | ICD-10-CM | POA: Insufficient documentation

## 2018-01-08 DIAGNOSIS — N183 Chronic kidney disease, stage 3 (moderate): Secondary | ICD-10-CM | POA: Diagnosis not present

## 2018-01-08 DIAGNOSIS — I5082 Biventricular heart failure: Secondary | ICD-10-CM | POA: Insufficient documentation

## 2018-01-08 DIAGNOSIS — I5032 Chronic diastolic (congestive) heart failure: Secondary | ICD-10-CM | POA: Insufficient documentation

## 2018-01-08 DIAGNOSIS — E1122 Type 2 diabetes mellitus with diabetic chronic kidney disease: Secondary | ICD-10-CM | POA: Diagnosis not present

## 2018-01-08 DIAGNOSIS — N179 Acute kidney failure, unspecified: Secondary | ICD-10-CM

## 2018-01-08 DIAGNOSIS — Z794 Long term (current) use of insulin: Secondary | ICD-10-CM | POA: Diagnosis not present

## 2018-01-08 DIAGNOSIS — Z881 Allergy status to other antibiotic agents status: Secondary | ICD-10-CM | POA: Insufficient documentation

## 2018-01-08 DIAGNOSIS — Z880 Allergy status to penicillin: Secondary | ICD-10-CM | POA: Insufficient documentation

## 2018-01-08 DIAGNOSIS — I130 Hypertensive heart and chronic kidney disease with heart failure and stage 1 through stage 4 chronic kidney disease, or unspecified chronic kidney disease: Secondary | ICD-10-CM | POA: Diagnosis not present

## 2018-01-08 DIAGNOSIS — L120 Bullous pemphigoid: Secondary | ICD-10-CM | POA: Diagnosis not present

## 2018-01-08 DIAGNOSIS — G4733 Obstructive sleep apnea (adult) (pediatric): Secondary | ICD-10-CM | POA: Diagnosis not present

## 2018-01-08 LAB — BASIC METABOLIC PANEL
Anion gap: 16 — ABNORMAL HIGH (ref 5–15)
BUN: 46 mg/dL — AB (ref 8–23)
CHLORIDE: 95 mmol/L — AB (ref 98–111)
CO2: 23 mmol/L (ref 22–32)
CREATININE: 1.66 mg/dL — AB (ref 0.44–1.00)
Calcium: 8.9 mg/dL (ref 8.9–10.3)
GFR calc Af Amer: 34 mL/min — ABNORMAL LOW (ref >60)
GFR calc non Af Amer: 29 mL/min — ABNORMAL LOW (ref >60)
GLUCOSE: 150 mg/dL — AB (ref 70–99)
POTASSIUM: 4.7 mmol/L (ref 3.5–5.1)
SODIUM: 134 mmol/L — AB (ref 135–145)

## 2018-01-08 LAB — BRAIN NATRIURETIC PEPTIDE: B NATRIURETIC PEPTIDE 5: 1693.9 pg/mL — AB (ref 0.0–100.0)

## 2018-01-08 NOTE — Progress Notes (Signed)
Patient ID: Nina Howell, female   DOB: [birth year], 77 y.o.   MRN: [id number]    Advanced Heart Failure Clinic Note   Primary Care: Dr. Baird Cancer Pulmonologist: Dr. Elsworth Soho Primary Cardiologist: Dr. Haroldine Laws Endocrinology: Dr Buddy Duty  HPI: Nina Howell is a 77 y.o. female with history of diastolic CHF, OSA, DM, morbid obesity, and COPD/asthma   Sees Dr Elsworth Soho for morbid obesity, OHS and OSA on CPAP has been encouraged to lose weight. Previously seen by Dr [name]. Echo 2011 done for dyspnea showed estimated PA pressure 56 mmHg and EF 45-50%. Underwent cath for abnormal ECG and nuclear study. Normal coronaries 2011. Follows with Dr. Johnsie Cancel now.   Admitted in 4/17 with respiratory failure and volume overload.. Overall diuresed 30 pounds. On ECHO RV severely dilated and LV EF 50-55%. With RVSP 69. Moderate pericardial effusion  VQ 08/25/15: Limited study due to body habitus. Possible 2 perfusion defects -> recommend chest CT CT 08/26/15: -> No PE Moderate to large pericardial effusion  Admitted 6/23 through 11/13/17 with volume overload. Diuresed with bumex and transitioned to torsemide 80 mg twice a day. Discharge weight was 296 pounds.   Admitted 8/4 through 12/28/17 with increased dyspnea and volume overload.  ECHO performed and showed biventricular heart failure and significant drop in EF to 10-15%.  Diuresed with IV lasix and later underwent cath.  RHC/LHC showed   low output and elevated filling pressures. Mixed venous saturations remained low. She was not a candidate for advanced therapies or home inotropes. Transitioned to bumex 4 mg twice a day. GOC discussed by Dr Haroldine Laws. She elected DNR/DNI.  Today she returns for post hospital follow up. Overall feeling ok but remains tired. She says that Dr Haroldine Laws told her  she has limited time to live but she does not want Palliative Care of Hospice. She says she doesn't want to be reminded about her heart failure. Limited mobility. Able to  take a couple of steps. Requires assistance with ADLs. SOB with exertion. Denies PND/Orthopnea. Appetite ok. No fever or chills. Weight at home 278-280  pounds. Taking all medications. She is unable to walk down steps and has EMS to transport her out of the house to her car. Followed by [facility].   Echo 10/2016:Marland Kitchen Very poor images  LVEF 55-60% RV dilated. Moderate HK. No TR to measure RVSP  No effusion   Echocardiogram 12/24/17 - Left ventricle: The cavity size was severely dilated. Wall thickness was normal. Systolic function was severely reduced. The estimated ejection fraction was in the range of 10% to 15%. Diffuse hypokinesis. Doppler parameters are consistent with abnormal left ventricular relaxation (grade 1 diastolic dysfunction). - Aortic valve: There was mild regurgitation. - Mitral valve: There was mild regurgitation directed posteriorly. - Left atrium: The atrium was mildly dilated. - Right ventricle: The cavity size was moderately dilated. Wall thickness was normal. Systolic function was moderately reduced. - Tricuspid valve: There was severe regurgitation.  RHC/LHC 12/25/2017  Ao = 87/59 (69) LV = 92/26 RA = 15 RV = 44/18 PA = 46/24 (31) PCW = 18 Fick cardiac output/index = 3.4/1.5 Thermo CO/CI 3.3/1.5 PVR = 4.0 WU SVR = 1286 Ao sat = 99% PA sat = 34%, 35% RA/PCWP = 0.83 PaPI = 1.5 CPO = 0.52 Assessment: 1. 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 Elevated filling pressures with markedly low cardiac output c/w shock physiology   Review of  systems complete and found to be negative unless listed in HPI.    Past Medical History:  Diagnosis Date  . Anemia   . Asymptomatic cholelithiasis   . Bullous pemphigoid   . Cellulitis    ABDOMINAL WALL  . CKD (chronic kidney disease) stage 3, GFR 30-59 ml/min (HCC)   . CTS (carpal tunnel syndrome)   .  Diabetes mellitus, type 2 ([place])   . DJD (degenerative joint disease)   . End Stage Biventricular heart failure ([place])    Echo 8/19:  EF 10-15, Gr 1 DD, mild AI, mild MR, mild LAE, mod RV dilation, mod reduced RVSF, severe TR // R/L [facility] 8/19:  normal cors; mean PA 31, PVR 4.0 WU, CO 3.4, PCWP 18  . Hepatic lesion   . History of pancreatitis   . Hyperlipidemia   . 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 Hypertension   . Hypokalemia   . Hypothyroidism   . IUD (intrauterine device) in place   . Liver cirrhosis ([place])   . Morbid obesity ([place])   . Obesity hypoventilation syndrome ([place])   . Pulmonary hypertension ([place])   . 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 Sleep apnea    cpap  . Varicose veins     Current Outpatient Medications  Medication Sig Dispense Refill  . acetaminophen (TYLENOL) 500 MG tablet Take 1,000 mg every 8 (eight) hours as needed by mouth for mild pain, moderate pain, fever or headache.    . ALPRAZolam (XANAX) 0.5 MG tablet Take 0.25 mg by mouth at bedtime as needed for anxiety.     . bumetanide (BUMEX) 2 MG tablet Take 2 tablets (4 mg total) by mouth 2 (two) times daily. 360 tablet 3  . cholecalciferol (VITAMIN D) 1000 units tablet Take 1,000 Units by mouth daily with lunch.     . clobetasol ointment (TEMOVATE) 2.56 % Apply 1 application topically 2 (two) times daily.    . Cranberry-Vitamin C-Vitamin E 4200-20-3 MG-MG-UNIT CAPS Take 4 capsules by mouth daily with lunch.    . Cyanocobalamin (VITAMIN B-12) 2500 MCG SUBL Place 2,500 mcg under the tongue daily with lunch.     . doxycycline (VIBRA-TABS) 100 MG tablet Take 100 mg by mouth 2 (two) times daily.    . fluticasone furoate-vilanterol (BREO ELLIPTA) 200-25 MCG/INH AEPB Inhale 1 puff into the lungs daily. 1 each 3  . guaiFENesin (MUCINEX) 600 MG 12 hr tablet Take 1,200 mg by mouth 2 (two) times daily as needed for cough or to loosen  phlegm. Started 10/30     . insulin NPH-regular Human (NOVOLIN 70/30) (70-30) 100 UNIT/ML injection Inject 16-26 Units into the skin See admin instructions. Inject 26 units subcutaneously daily after breakfast and 16 units after supper    . levothyroxine (SYNTHROID, LEVOTHROID) 50 MCG tablet Take 50 mcg by mouth daily before breakfast.     . lidocaine (XYLOCAINE) 5 % ointment Apply 1 application 2 (two) times daily topically.     . lipase/protease/amylase (CREON) 36000 UNITS CPEP capsule Take 38,937-34,287 Units by mouth See admin instructions. Take 2 capsules (72,000 units) by mouth up to 3 times daily with meals, take 1 capsule (36,000 units) with snacks    . metolazone (ZAROXOLYN) 2.5 MG tablet Take 1 tablet (2.5 mg total) by mouth See admin instructions. Take 1 tablet daily as needed for weight gain 3 lbs or more. 30 tablet 11  .  Multiple Vitamin (MULTIVITAMIN WITH MINERALS) TABS tablet Take 0.5 tablets by mouth daily. Centrum Silver    . OXYGEN Inhale 4 L into the lungs continuous.    . polyethylene glycol (MIRALAX / GLYCOLAX) packet Take 17 g by mouth daily as needed. 14 each 0  . potassium chloride SA (K-DUR,KLOR-CON) 20 MEQ tablet Take 2 tablets (40 mEq total) by mouth 2 (two) times daily. (Patient taking differently: Take 20 mEq by mouth 3 (three) times daily. ) 120 tablet 11  . PROAIR HFA 108 (90 Base) MCG/ACT inhaler INHALE TWO PUFFS BY MOUTH EVERY 4 HOURS AS NEEDED FOR WHEEZING OR FOR SHORTNESS OF BREATH 1 Inhaler 2  . sertraline (ZOLOFT) 50 MG tablet Take 50 mg by mouth at bedtime.     Marland Kitchen spironolactone (ALDACTONE) 25 MG tablet Take 1 tablet (25 mg total) by mouth daily with lunch. Please call [phone] to schedule appointment for additional refills thanks. 30 tablet 5   No current facility-administered medications for this encounter.     Allergies  Allergen Reactions  . Celebrex [Celecoxib] Swelling  . Penicillins Rash    Has patient had a PCN reaction causing immediate rash,  facial/tongue/throat swelling, SOB or lightheadedness with hypotension: Yes Has patient had a PCN reaction causing severe rash involving mucus membranes or skin necrosis: Yes Has patient had a PCN reaction that required hospitalization: No Has patient had a PCN reaction occurring within the last 10 years: Yes - Okay taking other cillin's If all of the above answers are "NO", then may proceed with Cephalosporin use.   . Cephalexin Hives  . Lasix [Furosemide] Hives  . Oxycodone-Acetaminophen Nausea Only  . Cephalosporins Rash  . Oxycodone Anxiety    Felt weird      Social History   Socioeconomic History  . Marital status: Married    Spouse name: Not on file  . Number of children: 4  . Years of education: Not on file  . Highest education level: Not on file  Occupational History  . 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 Financial resource strain: Patient refused  . Food insecurity:    Worry: Patient refused    Inability: Patient refused  . Transportation needs:    Medical: Patient refused    Non-medical: Patient refused  Tobacco Use  . Smoking status: Never Smoker  . Smokeless tobacco: Never Used  Substance and Sexual Activity  . Alcohol use: No  . Drug use: No  . Sexual activity: Yes    Birth control/protection: IUD  Lifestyle  . Physical activity:    Days per week: Patient refused    Minutes per session: Patient refused  . Stress: Patient refused  Relationships  . Social connections:    Talks on phone: Patient refused    Gets together: Patient refused    Attends religious service: Patient refused    Active member of club or organization: Patient refused    Attends meetings of clubs or organizations: Patient refused    Relationship status: Patient refused  . Intimate partner violence:    Fear of current or ex partner: Patient refused    Emotionally abused: Patient refused    Physically abused: Patient refused     Forced sexual activity: Patient refused  Other Topics Concern  . Not on file  Social History Narrative  . Not on file      Family History  Problem Relation Age of Onset  . Cancer  Mother        throat  . Diabetes Father   . Leukemia Father     Vitals:   01/08/18 1111  BP: 112/80  Pulse: 91  SpO2: 95%  Weight: 127 kg (280 lb)     Wt Readings from Last 3 Encounters:  01/08/18 127 kg (280 lb)  01/01/18 127 kg (280 lb)  12/28/17 131.5 kg (289 lb 14.5 oz)  She was unable to weigh in the the clinic. The above is a reported home weight.    PHYSICAL EXAM:  General:  Appears chronically ill. No resp difficulty. Arrived in wheel chair with her husband.  HEENT: normal Neck: supple. JVP 6-7  Carotids 2+ bilat; no bruits. No lymphadenopathy or thryomegaly appreciated. Cor: PMI nondisplaced. Regular rate & rhythm. No rubs, or murmurs. +S3 Lungs: clear Abdomen: obese, soft, nontender, nondistended. No hepatosplenomegaly. No bruits or masses. Good bowel sounds. Extremities: no cyanosis, clubbing, rash, edema Neuro: alert & orientedx3, cranial nerves grossly intact. moves all 4 extremities w/o difficulty. Affect pleasant Skin: multiple wounds on her extremities with dressing on RUE and LLE.   ASSESSMENT & PLAN:  1. End Stage Biventricular Heart Failure  LV normal on ECHO in June 2018 ---------->significant reduction in EF---> Echo 12/2017 EF 10-15% with biventricular heart failure noted. NICM had LHC/RHC 12/2017 with low output and norm cors.  - NYHA IIIc Volume status does not appear elevated. BMET obtained and showed worsening renal function. She was instructed to hold afternoon bumex for 2 days the resume bumex 4 mg twice a day.  -Plan to check BMET on Monday.   - Continue spiro 25 mg daily - Continue HF Paramedicine.   2. 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  - - VQ scan was indeterminate but Chest CT  not suggestive of chronic PE, LE dopplers negative.  - ANA 1:80 Positive, RF, P-ANCA negative,  ESR 35. Likely not clinically significant.  -Recent RHC 12/2017 PVR was 4. - Continue oxygen 2 liters  3. Pericardial effusion, moderate to large - Resolved with diuresis. No change.  4. Morbid obesity - Body mass index is 52.91 kg/m.  -Discussed portion control.  6. Obesity hypoventilation syndrome Continue oxygen 7. OSA on BiPAP - Encouraged nightly use.  8. Bullous pemphigoid - Followed by Dermatology. No change.  -HH following.  9. AKI on CKD stage III.  Holding diuretics as above. Repeat BMET on Monday.   Today I discussed lab results and medication changes. She has end stage biventricular heart failure. She does not wish pursue Palliative Care or Hospice but understands she has limited time.   Repeat BMET on Monday. Creatine may worsen with cardiorenal syndrome and low output heart failure.   Follow up   2-3 weeks with Dr Haroldine Laws.  Greater than 50% of the 45 minute visit was spent in counseling/coordination of care regarding disease state education, salt/fluid restriction, sliding scale diuretics, and medication compliance.    [name], NP  11:17 AM

## 2018-01-08 NOTE — Progress Notes (Signed)
[place] @ [phone] and gave verbal orders to draw bmet on Monday and fax results to our office.

## 2018-01-08 NOTE — Patient Instructions (Addendum)
HOLD Bumex tonight and tomorrow night.  Still take your morning dose tomorrow morning.  Resume 4 mg Twice Daily on Friday.  Have home health draw labs on Monday.  I'll fax them a prescription.  Follow up with Dr. Haroldine Laws as scheduled.

## 2018-01-08 NOTE — Progress Notes (Signed)
Paramedicine Encounter   Patient ID: Nina Howell , female,   DOB: 09/07/1940,76 y.o.,  MRN: [id number]   Met patient in clinic today with provider.  Time spent with patient 70mn   Weight @ clinic-280 B/P-112/80 HR-90 sp02-95  Pt states her CBG was 69 this morning, the other day it was 54. Her insulin has been changed due to that fact. Pt reports that Dr vFara Oldenher PCP, called her and told her that she was no longer going to be seeing her and she cant go back to dr kBuddy Dutyeither due to her not following orders and not using scat for her transportation--she is suppose to receive a letter advising this as well.  Pt utilizes EMS to assist with moving her down the stairs via stair chair and manpower as she cannot maneuver this herself and husband unable to assist moving her down stairs as well. Will address at next visit.   Per clinic pt will skip her evening dose of bumex this evening and tomor evening due to bloodwork results.  I went to pts home after clinic visit to assist moving pt upstairs. EMS needed again to place pt in stair chair with manpower to move her upstairs.   Pt refuses hospice or any palliative care services at this time.  Will see her next week for f/u.   KMarylouise Stacks ESan Jose8/22/2019   ACTION: Home visit completed

## 2018-01-09 DIAGNOSIS — Z794 Long term (current) use of insulin: Secondary | ICD-10-CM | POA: Diagnosis not present

## 2018-01-09 DIAGNOSIS — Z6841 Body Mass Index (BMI) 40.0 and over, adult: Secondary | ICD-10-CM | POA: Diagnosis not present

## 2018-01-09 DIAGNOSIS — I5042 Chronic combined systolic (congestive) and diastolic (congestive) heart failure: Secondary | ICD-10-CM | POA: Diagnosis not present

## 2018-01-09 DIAGNOSIS — Z4800 Encounter for change or removal of nonsurgical wound dressing: Secondary | ICD-10-CM | POA: Diagnosis not present

## 2018-01-09 DIAGNOSIS — G4733 Obstructive sleep apnea (adult) (pediatric): Secondary | ICD-10-CM | POA: Diagnosis not present

## 2018-01-09 DIAGNOSIS — Z7951 Long term (current) use of inhaled steroids: Secondary | ICD-10-CM | POA: Diagnosis not present

## 2018-01-09 DIAGNOSIS — J449 Chronic obstructive pulmonary disease, unspecified: Secondary | ICD-10-CM | POA: Diagnosis not present

## 2018-01-09 DIAGNOSIS — N183 Chronic kidney disease, stage 3 (moderate): Secondary | ICD-10-CM | POA: Diagnosis not present

## 2018-01-09 DIAGNOSIS — L120 Bullous pemphigoid: Secondary | ICD-10-CM | POA: Diagnosis not present

## 2018-01-09 DIAGNOSIS — I272 Pulmonary hypertension, unspecified: Secondary | ICD-10-CM | POA: Diagnosis not present

## 2018-01-09 DIAGNOSIS — Z9981 Dependence on supplemental oxygen: Secondary | ICD-10-CM | POA: Diagnosis not present

## 2018-01-09 DIAGNOSIS — E662 Morbid (severe) obesity with alveolar hypoventilation: Secondary | ICD-10-CM | POA: Diagnosis not present

## 2018-01-09 DIAGNOSIS — E1122 Type 2 diabetes mellitus with diabetic chronic kidney disease: Secondary | ICD-10-CM | POA: Diagnosis not present

## 2018-01-10 ENCOUNTER — Telehealth (HOSPITAL_COMMUNITY): Payer: Self-pay

## 2018-01-10 ENCOUNTER — Other Ambulatory Visit: Payer: Self-pay

## 2018-01-10 NOTE — Telephone Encounter (Signed)
This encounter was created in error - please disregard.

## 2018-01-10 NOTE — Telephone Encounter (Signed)
She should hold her bumex today with diarrhea and poor intake. If she continues to have diarrhea and poor intake, she should continue to hold. She should follow up with PCP if symptoms persist.

## 2018-01-10 NOTE — Telephone Encounter (Signed)
Denton Brick from [facility] care management called to report that pt has had diarrhea, no appetite, sick on stomach, not urinating today as much as yesterday, no accurate weight, not SOB, blood sugar is 100, and feet swelling. Please advise.

## 2018-01-10 NOTE — Patient Outreach (Signed)
[facility]) Care Management  01/10/2018  Kathaleya Mcduffee 16-Jul-1940 [id number]  Care Coordination: RNCM received return call from advanced heart failure clinic, Levada Dy regarding clients complaint/request for follow up call.  Plan: continue to follow  [name], RN, MSN, [place] Coordinator Cell: [phone]

## 2018-01-10 NOTE — Patient Outreach (Signed)
[facility]) Care Management  01/10/2018  [name] June 05, 1940 [id number]   RNCM called to follow up. No answer. HIPPA compliant message left.   Client returned call and states "Yesterday I had a bad day I was sick on the stomach".  "I had therapist and wound care lady. Client reports she saw [name] on Monday. She reports Home health still coming to see her. As well as paramedicine.   She reports diarrhea, decreased appetite. "I am not hungry". She reports she had diarrhea three times yesterday. She reports weighing self daily, but states last weights have not been accurate. She reports some swelling in her feet and adds "I am not loosing the weight that I like to loose, but my blood sugar was 100 this morning". She states today she ate grits, boiled egg, coffee. Client states her Bumex was stopped for two nights and will restart tonight. Client reports that she has been trying to reach the heart failure clinic to see if this could be affecting her.   She reports she continues to be seen at the heart failure clinic. She reports she is weighing but recent weight she does not think is accurate.   RNCM discussed additional resources and introduces the care connections program, but client states that she doesn't want anymore people out at her house at this time.  Client request RNCM follow up with heart failure clinic to request a call back to patient. RNCM called and was only able to received voice message. HIPPA compliant message left.  Follow up appointment with Dr. Haroldine Laws scheduled on Oct 2nd.   RNCM attempted to schedule a home visit. Client was undecided about scheduling a home visit, but agreed to [facility] completing a home visit with paramedicine.  Plan: care coordination with para-medicine. Continue to follow.  [name], RN, MSN, [place] Coordinator Cell: [phone]

## 2018-01-13 ENCOUNTER — Telehealth: Payer: Self-pay | Admitting: Pulmonary Disease

## 2018-01-13 ENCOUNTER — Other Ambulatory Visit: Payer: Self-pay

## 2018-01-13 DIAGNOSIS — Z4800 Encounter for change or removal of nonsurgical wound dressing: Secondary | ICD-10-CM | POA: Diagnosis not present

## 2018-01-13 DIAGNOSIS — Z6841 Body Mass Index (BMI) 40.0 and over, adult: Secondary | ICD-10-CM | POA: Diagnosis not present

## 2018-01-13 DIAGNOSIS — Z9981 Dependence on supplemental oxygen: Secondary | ICD-10-CM | POA: Diagnosis not present

## 2018-01-13 DIAGNOSIS — E1122 Type 2 diabetes mellitus with diabetic chronic kidney disease: Secondary | ICD-10-CM | POA: Diagnosis not present

## 2018-01-13 DIAGNOSIS — E662 Morbid (severe) obesity with alveolar hypoventilation: Secondary | ICD-10-CM | POA: Diagnosis not present

## 2018-01-13 DIAGNOSIS — L120 Bullous pemphigoid: Secondary | ICD-10-CM | POA: Diagnosis not present

## 2018-01-13 DIAGNOSIS — I272 Pulmonary hypertension, unspecified: Secondary | ICD-10-CM | POA: Diagnosis not present

## 2018-01-13 DIAGNOSIS — G4733 Obstructive sleep apnea (adult) (pediatric): Secondary | ICD-10-CM | POA: Diagnosis not present

## 2018-01-13 DIAGNOSIS — J449 Chronic obstructive pulmonary disease, unspecified: Secondary | ICD-10-CM | POA: Diagnosis not present

## 2018-01-13 DIAGNOSIS — N183 Chronic kidney disease, stage 3 (moderate): Secondary | ICD-10-CM | POA: Diagnosis not present

## 2018-01-13 DIAGNOSIS — Z794 Long term (current) use of insulin: Secondary | ICD-10-CM | POA: Diagnosis not present

## 2018-01-13 DIAGNOSIS — Z7951 Long term (current) use of inhaled steroids: Secondary | ICD-10-CM | POA: Diagnosis not present

## 2018-01-13 DIAGNOSIS — I5042 Chronic combined systolic (congestive) and diastolic (congestive) heart failure: Secondary | ICD-10-CM | POA: Diagnosis not present

## 2018-01-13 NOTE — Telephone Encounter (Signed)
Called and spoke with patient, she states that her cardiologist Dr. Jeffie Pollock has given her 6 months to live. She would like to talk to Dr. Elsworth Soho about this. Patient does not want to talk to anyone but Dr. Elsworth Soho, or TP.   Due to RA being out of the office, TP please advise, thank you.

## 2018-01-13 NOTE — Patient Outreach (Addendum)
[facility]) Care Management  01/13/2018  Nina Howell 15-Apr-1941 [id number]   RNCM with follow up/return call to Paramedicine, Nina Howell. Left HIPPA compliance message left.   Plan: continue to follow.  [name], RN, MSN, [place] Coordinator Cell: [phone]  Addendum: received return call from paramedicne, Nina Howell who is agreeable to co-visit. Plan: home visit with paramedicine who is in the process of arranging date/time.

## 2018-01-13 NOTE — Telephone Encounter (Signed)
Discussed her questions

## 2018-01-14 ENCOUNTER — Telehealth (HOSPITAL_COMMUNITY): Payer: Self-pay | Admitting: *Deleted

## 2018-01-14 DIAGNOSIS — L120 Bullous pemphigoid: Secondary | ICD-10-CM | POA: Diagnosis not present

## 2018-01-14 DIAGNOSIS — E662 Morbid (severe) obesity with alveolar hypoventilation: Secondary | ICD-10-CM | POA: Diagnosis not present

## 2018-01-14 DIAGNOSIS — Z9981 Dependence on supplemental oxygen: Secondary | ICD-10-CM | POA: Diagnosis not present

## 2018-01-14 DIAGNOSIS — E1122 Type 2 diabetes mellitus with diabetic chronic kidney disease: Secondary | ICD-10-CM | POA: Diagnosis not present

## 2018-01-14 DIAGNOSIS — I272 Pulmonary hypertension, unspecified: Secondary | ICD-10-CM | POA: Diagnosis not present

## 2018-01-14 DIAGNOSIS — Z7951 Long term (current) use of inhaled steroids: Secondary | ICD-10-CM | POA: Diagnosis not present

## 2018-01-14 DIAGNOSIS — Z4800 Encounter for change or removal of nonsurgical wound dressing: Secondary | ICD-10-CM | POA: Diagnosis not present

## 2018-01-14 DIAGNOSIS — J449 Chronic obstructive pulmonary disease, unspecified: Secondary | ICD-10-CM | POA: Diagnosis not present

## 2018-01-14 DIAGNOSIS — Z794 Long term (current) use of insulin: Secondary | ICD-10-CM | POA: Diagnosis not present

## 2018-01-14 DIAGNOSIS — N183 Chronic kidney disease, stage 3 (moderate): Secondary | ICD-10-CM | POA: Diagnosis not present

## 2018-01-14 DIAGNOSIS — I5042 Chronic combined systolic (congestive) and diastolic (congestive) heart failure: Secondary | ICD-10-CM | POA: Diagnosis not present

## 2018-01-14 DIAGNOSIS — G4733 Obstructive sleep apnea (adult) (pediatric): Secondary | ICD-10-CM | POA: Diagnosis not present

## 2018-01-14 DIAGNOSIS — Z6841 Body Mass Index (BMI) 40.0 and over, adult: Secondary | ICD-10-CM | POA: Diagnosis not present

## 2018-01-14 NOTE — Telephone Encounter (Signed)
Pts daughter called stating her mother is getting worse. Daughter is considering palliative care or hospice but really wants to talk to a provider about it. Message given to [facility] to follow up with Dr.Bensimhon or Amy. Daughter is ok to talk to either.

## 2018-01-16 ENCOUNTER — Other Ambulatory Visit (HOSPITAL_COMMUNITY): Payer: Self-pay

## 2018-01-16 ENCOUNTER — Other Ambulatory Visit: Payer: Self-pay

## 2018-01-16 DIAGNOSIS — Z4800 Encounter for change or removal of nonsurgical wound dressing: Secondary | ICD-10-CM | POA: Diagnosis not present

## 2018-01-16 DIAGNOSIS — G4733 Obstructive sleep apnea (adult) (pediatric): Secondary | ICD-10-CM | POA: Diagnosis not present

## 2018-01-16 DIAGNOSIS — Z7951 Long term (current) use of inhaled steroids: Secondary | ICD-10-CM | POA: Diagnosis not present

## 2018-01-16 DIAGNOSIS — E662 Morbid (severe) obesity with alveolar hypoventilation: Secondary | ICD-10-CM | POA: Diagnosis not present

## 2018-01-16 DIAGNOSIS — I5042 Chronic combined systolic (congestive) and diastolic (congestive) heart failure: Secondary | ICD-10-CM | POA: Diagnosis not present

## 2018-01-16 DIAGNOSIS — Z9981 Dependence on supplemental oxygen: Secondary | ICD-10-CM | POA: Diagnosis not present

## 2018-01-16 DIAGNOSIS — E1122 Type 2 diabetes mellitus with diabetic chronic kidney disease: Secondary | ICD-10-CM | POA: Diagnosis not present

## 2018-01-16 DIAGNOSIS — Z6841 Body Mass Index (BMI) 40.0 and over, adult: Secondary | ICD-10-CM | POA: Diagnosis not present

## 2018-01-16 DIAGNOSIS — J449 Chronic obstructive pulmonary disease, unspecified: Secondary | ICD-10-CM | POA: Diagnosis not present

## 2018-01-16 DIAGNOSIS — L120 Bullous pemphigoid: Secondary | ICD-10-CM | POA: Diagnosis not present

## 2018-01-16 DIAGNOSIS — I272 Pulmonary hypertension, unspecified: Secondary | ICD-10-CM | POA: Diagnosis not present

## 2018-01-16 DIAGNOSIS — Z794 Long term (current) use of insulin: Secondary | ICD-10-CM | POA: Diagnosis not present

## 2018-01-16 DIAGNOSIS — N183 Chronic kidney disease, stage 3 (moderate): Secondary | ICD-10-CM | POA: Diagnosis not present

## 2018-01-16 NOTE — Progress Notes (Signed)
Paramedicine Encounter    Patient ID: Nina Howell, female    DOB: Jul 03, 1940, 77 y.o.   MRN: [id number]   Patient Care Team: Leeroy Cha, MD as PCP - General (Internal Medicine) Bensimhon, Shaune Pascal, MD as PCP - Cardiology (Cardiology) Delrae Rend, MD as Consulting Physician (Internal Medicine) Luretha Rued, RN as [place] Management  Patient Active Problem List   Diagnosis Date Noted  . CKD (chronic kidney disease) stage 3, GFR 30-59 ml/min (HCC) 12/28/2017  . DNR (do not resuscitate) 12/28/2017  . Pressure injury of skin 11/11/2017  . Chronic respiratory failure with hypoxia ([place]) 04/03/2017  . Pericardial effusion 11/23/2015  . Chronic diastolic heart failure ([place]) 11/23/2015  . Pulmonary hypertension, unspecified ([place]) 09/06/2015  . Acute on chronic congestive heart failure ([place])   . Rash and nonspecific skin eruption 11/27/2014  . Post-menopausal bleeding 06/18/2011  . Varicose veins 04/25/2011  . Unspecified venous (peripheral) insufficiency 04/25/2011  . Hemangioma of liver, left side 03/23/2011  . HYPOTHYROIDISM 04/27/2010  . Type 2 diabetes mellitus with complication, with long-term current use of insulin ([place]) 04/27/2010  . HYPOKALEMIA 04/27/2010  . Morbid obesity ([place]) 04/27/2010  . DEPRESSION 04/27/2010  . Obstructive sleep apnea 04/27/2010  . Essential hypertension 04/27/2010  . End Stage Biventricular heart failure ([place]) 04/27/2010  . Obesity hypoventilation syndrome ([place]) 04/27/2010    Current Outpatient Medications:  .  acetaminophen (TYLENOL) 500 MG tablet, Take 1,000 mg every 8 (eight) hours as needed by mouth for mild pain, moderate pain, fever or headache., Disp: , Rfl:  .  ALPRAZolam (XANAX) 0.5 MG tablet, Take 0.25 mg by mouth at bedtime as needed for anxiety. , Disp: , Rfl:  .  bumetanide (BUMEX) 2 MG tablet, Take 2 tablets (4 mg total) by mouth 2 (two) times daily., Disp: 360 tablet, Rfl: 3 .  cholecalciferol  (VITAMIN D) 1000 units tablet, Take 1,000 Units by mouth daily with lunch. , Disp: , Rfl:  .  clobetasol ointment (TEMOVATE) 5.32 %, Apply 1 application topically 2 (two) times daily., Disp: , Rfl:  .  Cranberry-Vitamin C-Vitamin E 4200-20-3 MG-MG-UNIT CAPS, Take 4 capsules by mouth daily with lunch., Disp: , Rfl:  .  Cyanocobalamin (VITAMIN B-12) 2500 MCG SUBL, Place 2,500 mcg under the tongue daily with lunch. , Disp: , Rfl:  .  doxycycline (VIBRA-TABS) 100 MG tablet, Take 100 mg by mouth 2 (two) times daily., Disp: , Rfl:  .  fluticasone furoate-vilanterol (BREO ELLIPTA) 200-25 MCG/INH AEPB, Inhale 1 puff into the lungs daily., Disp: 1 each, Rfl: 3 .  insulin NPH-regular Human (NOVOLIN 70/30) (70-30) 100 UNIT/ML injection, Inject 16-26 Units into the skin See admin instructions. Inject 26 units subcutaneously daily after breakfast and 16 units after supper, Disp: , Rfl:  .  levothyroxine (SYNTHROID, LEVOTHROID) 50 MCG tablet, Take 50 mcg by mouth daily before breakfast. , Disp: , Rfl:  .  lidocaine (XYLOCAINE) 5 % ointment, Apply 1 application 2 (two) times daily topically. , Disp: , Rfl:  .  metolazone (ZAROXOLYN) 2.5 MG tablet, Take 1 tablet (2.5 mg total) by mouth See admin instructions. Take 1 tablet daily as needed for weight gain 3 lbs or more., Disp: 30 tablet, Rfl: 11 .  Multiple Vitamin (MULTIVITAMIN WITH MINERALS) TABS tablet, Take 0.5 tablets by mouth daily. Centrum Silver, Disp: , Rfl:  .  OXYGEN, Inhale 4 L into the lungs continuous., Disp: , Rfl:  .  polyethylene glycol (MIRALAX / GLYCOLAX) packet, Take 17 g by  mouth daily as needed., Disp: 14 each, Rfl: 0 .  potassium chloride SA (K-DUR,KLOR-CON) 20 MEQ tablet, Take 2 tablets (40 mEq total) by mouth 2 (two) times daily. (Patient taking differently: Take 20 mEq by mouth 3 (three) times daily. ), Disp: 120 tablet, Rfl: 11 .  PROAIR HFA 108 (90 Base) MCG/ACT inhaler, INHALE TWO PUFFS BY MOUTH EVERY 4 HOURS AS NEEDED FOR WHEEZING OR FOR  SHORTNESS OF BREATH, Disp: 1 Inhaler, Rfl: 2 .  sertraline (ZOLOFT) 50 MG tablet, Take 50 mg by mouth at bedtime. , Disp: , Rfl:  .  spironolactone (ALDACTONE) 25 MG tablet, Take 1 tablet (25 mg total) by mouth daily with lunch. Please call [phone] to schedule appointment for additional refills thanks., Disp: 30 tablet, Rfl: 5 .  guaiFENesin (MUCINEX) 600 MG 12 hr tablet, Take 1,200 mg by mouth 2 (two) times daily as needed for cough or to loosen phlegm. Started 10/30 , Disp: , Rfl:  .  lipase/protease/amylase (CREON) 36000 UNITS CPEP capsule, Take 36,000-72,000 Units by mouth See admin instructions. Take 2 capsules (72,000 units) by mouth up to 3 times daily with meals, take 1 capsule (36,000 units) with snacks, Disp: , Rfl:  Allergies  Allergen Reactions  . Celebrex [Celecoxib] Swelling  . Penicillins Rash    Has patient had a PCN reaction causing immediate rash, facial/tongue/throat swelling, SOB or lightheadedness with hypotension: Yes Has patient had a PCN reaction causing severe rash involving mucus membranes or skin necrosis: Yes Has patient had a PCN reaction that required hospitalization: No Has patient had a PCN reaction occurring within the last 10 years: Yes - Okay taking other cillin's If all of the above answers are "NO", then may proceed with Cephalosporin use.   . Cephalexin Hives  . Lasix [Furosemide] Hives  . Oxycodone-Acetaminophen Nausea Only  . Cephalosporins Rash  . Oxycodone Anxiety    Felt weird      Social History   Socioeconomic History  . Marital status: Married    Spouse name: Not on file  . Number of children: 4  . Years of education: Not on file  . Highest education level: Not on file  Occupational History  . 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 Financial resource strain: Patient refused  . Food insecurity:    Worry: Patient refused    Inability: Patient refused  . Transportation needs:     Medical: Patient refused    Non-medical: Patient refused  Tobacco Use  . Smoking status: Never Smoker  . Smokeless tobacco: Never Used  Substance and Sexual Activity  . Alcohol use: No  . Drug use: No  . Sexual activity: Yes    Birth control/protection: IUD  Lifestyle  . Physical activity:    Days per week: Patient refused    Minutes per session: Patient refused  . Stress: Patient refused  Relationships  . Social connections:    Talks on phone: Patient refused    Gets together: Patient refused    Attends religious service: Patient refused    Active member of club or organization: Patient refused    Attends meetings of clubs or organizations: Patient refused    Relationship status: Patient refused  . Intimate partner violence:    Fear of current or ex partner: Patient refused    Emotionally abused: Patient refused    Physically abused: Patient refused    Forced sexual activity: Patient refused  Other Topics Concern  .  Not on file  Social History Narrative  . Not on file    Physical Exam      Future Appointments  Date Time Provider [place]  02/19/2018 10:40 AM Bensimhon, Shaune Pascal, MD [facility] None    Pulse 82   Resp 15   Wt 281 lb (127.5 kg)   SpO2 96%   BMI 53.09 kg/m   Weight yesterday-279 lb  Last visit weight-280 @ clinic CBG PTA-140   Pt was asleep when I arrived at our appoint time, she states she wasn't feeling good the other day, that's when her daughter called clinic and said she wanted to speak to provider regarding more services.  Pt had PT today, her home health nurse still coming.  She reports her home aide is no longer coming out, not sure what has happened with that. She is going through the same company, [employer], for another aide.  She had been having issues with her PCP and her daughter called the office and is handling it. Supposedly it was due to her missing so many appointments. Daughter was able to be reinstated with Dr  Buddy Duty but no longer able to be with the other [place] facilities.  Pt reports she does not need PCP at this time, Dr Buddy Duty is prescribing her the zoloft and thyroid medications.  Pt reports that she is sleeping a lot more.  Pt finally agreed to allow care connections/palliative care to speak to her daughter angela.  Pt states she isnt doing too well today as her PT came this morning and makes her tired.  Due to her size and her very weak pulses unable to get her b/p. Pt denies any dizziness, no sob. +color, +cap refill.  [place], [place] [facility] Paramedic  01/16/18

## 2018-01-16 NOTE — Patient Outreach (Addendum)
[facility]) Care Management   01/16/2018  Nina Howell 01-06-1941 [id number]  Nina Howell is an 77 y.o. female  Subjective: client denies, nausea and no shortness of breath at this time.  Objective:  Due to size, weak pulse, unable to obtain blood pressure. Denies dizziness. O2 saturation 94% HR 82, respirations16. Review of Systems  Respiratory:       Decreased breath sounds clear.  Cardiovascular:       S1S2 noted,    Physical Exam skin warm dry, color within normal limits.  Encounter Medications:   Outpatient Encounter Medications as of 01/16/2018  Medication Sig Note  . acetaminophen (TYLENOL) 500 MG tablet Take 1,000 mg every 8 (eight) hours as needed by mouth for mild pain, moderate pain, fever or headache.   . ALPRAZolam (XANAX) 0.5 MG tablet Take 0.25 mg by mouth at bedtime as needed for anxiety.  12/22/2017: #10 filled 10/24/17 Nina Howell  . bumetanide (BUMEX) 2 MG tablet Take 2 tablets (4 mg total) by mouth 2 (two) times daily.   . cholecalciferol (VITAMIN D) 1000 units tablet Take 1,000 Units by mouth daily with lunch.    . clobetasol ointment (TEMOVATE) 4.01 % Apply 1 application topically 2 (two) times daily.   . Cranberry-Vitamin C-Vitamin E 4200-20-3 MG-MG-UNIT CAPS Take 4 capsules by mouth daily with lunch.   . Cyanocobalamin (VITAMIN B-12) 2500 MCG SUBL Place 2,500 mcg under the tongue daily with lunch.    . doxycycline (VIBRA-TABS) 100 MG tablet Take 100 mg by mouth 2 (two) times daily. 12/22/2017: Continuous course for bullous pemphigoid  . fluticasone furoate-vilanterol (BREO ELLIPTA) 200-25 MCG/INH AEPB Inhale 1 puff into the lungs daily.   Marland Kitchen guaiFENesin (MUCINEX) 600 MG 12 hr tablet Take 1,200 mg by mouth 2 (two) times daily as needed for cough or to loosen phlegm. Started 10/30    . insulin NPH-regular Human (NOVOLIN 70/30) (70-30) 100 UNIT/ML injection Inject 16-26 Units into the skin See admin instructions. Inject 26  units subcutaneously daily after breakfast and 16 units after supper 01/16/2018: 26 units in the morning and 13 units at night.  . levothyroxine (SYNTHROID, LEVOTHROID) 50 MCG tablet Take 50 mcg by mouth daily before breakfast.    . lidocaine (XYLOCAINE) 5 % ointment Apply 1 application 2 (two) times daily topically.    . metolazone (ZAROXOLYN) 2.5 MG tablet Take 1 tablet (2.5 mg total) by mouth See admin instructions. Take 1 tablet daily as needed for weight gain 3 lbs or more.   . Multiple Vitamin (MULTIVITAMIN WITH MINERALS) TABS tablet Take 0.5 tablets by mouth daily. Centrum Silver   . OXYGEN Inhale 4 L into the lungs continuous.   . polyethylene glycol (MIRALAX / GLYCOLAX) packet Take 17 g by mouth daily as needed.   . potassium chloride SA (K-DUR,KLOR-CON) 20 MEQ tablet Take 2 tablets (40 mEq total) by mouth 2 (two) times daily. (Patient taking differently: Take 20 mEq by mouth 3 (three) times daily. ) 01/16/2018: Takes two in the morning and two in the evening.  Marland Kitchen PROAIR HFA 108 (90 Base) MCG/ACT inhaler INHALE TWO PUFFS BY MOUTH EVERY 4 HOURS AS NEEDED FOR WHEEZING OR FOR SHORTNESS OF BREATH   . sertraline (ZOLOFT) 50 MG tablet Take 50 mg by mouth at bedtime.    Marland Kitchen spironolactone (ALDACTONE) 25 MG tablet Take 1 tablet (25 mg total) by mouth daily with lunch. Please call [phone] to schedule appointment for additional refills thanks.   . lipase/protease/amylase (CREON)  36000 UNITS CPEP capsule Take 00,712-19,758 Units by mouth See admin instructions. Take 2 capsules (72,000 units) by mouth up to 3 times daily with meals, take 1 capsule (36,000 units) with snacks    No facility-administered encounter medications on file as of 01/16/2018.     Functional Status:   In your present state of health, do you have any difficulty performing the following activities: 12/23/2017 12/23/2017  Hearing? N -  Vision? N -  Difficulty concentrating or making decisions? N -  Walking or climbing stairs? Y -   Dressing or bathing? Y -  Doing errands, shopping? - Y  Preparing Food and eating ? - -  Using the Toilet? - -  In the past six months, have you accidently leaked urine? - -  Do you have problems with loss of bowel control? - -  Managing your Medications? - -  Managing your Finances? - -  Housekeeping or managing your Housekeeping? - -  Some recent data might be hidden    Fall/Depression Screening:    Fall Risk  12/11/2017 02/20/2016 12/27/2014  Falls in the past year? No No No  Risk for fall due to : - Impaired mobility Impaired mobility   PHQ 2/9 Scores 12/12/2017 12/11/2017 02/20/2016  PHQ - 2 Score 0 0 1    Assessment:  77 year old with history of Heart failure diabetes, sleep apnea, obesity.Recent admisstion 12/22/17-12/28/17 with acute on chronic heart failure. Active with Paramedicine.  Co-visit completed with Nina Howell paramedicine. Client is without specific complaints at this time. She seems to be in good spirits and is talkative.  Client reports she is not looking for another primary care provider at this time. She states her insurance provider is working on this. She continues to be followed by Dr. Haroldine Howell at the advanced heart failure clinic, Dr. Buddy Howell for endocrinology and Dr. Elsworth Howell pulmonary. RNCM discussed the importance of having a primary care provider and encouraged client to follow up with her insurance company to select another provider or re-establish care with previous provider.  Palliaitive care and care connections program discussed. Client is agreeable but request they talk with her daughter, Nina Howell at [phone].   When leaving cient's husband asked RNCM and Nina Howell about palliative care and personal care services.  Plan: social work Retail buyer and care connections referral completed. Continue to follow.  Nina Silversmith, RN, MSN, [place] Coordinator Cell: [phone]

## 2018-01-17 ENCOUNTER — Other Ambulatory Visit: Payer: Self-pay

## 2018-01-17 DIAGNOSIS — I5042 Chronic combined systolic (congestive) and diastolic (congestive) heart failure: Secondary | ICD-10-CM | POA: Diagnosis not present

## 2018-01-17 DIAGNOSIS — E662 Morbid (severe) obesity with alveolar hypoventilation: Secondary | ICD-10-CM | POA: Diagnosis not present

## 2018-01-17 DIAGNOSIS — Z7951 Long term (current) use of inhaled steroids: Secondary | ICD-10-CM | POA: Diagnosis not present

## 2018-01-17 DIAGNOSIS — Z4800 Encounter for change or removal of nonsurgical wound dressing: Secondary | ICD-10-CM | POA: Diagnosis not present

## 2018-01-17 DIAGNOSIS — G4733 Obstructive sleep apnea (adult) (pediatric): Secondary | ICD-10-CM | POA: Diagnosis not present

## 2018-01-17 DIAGNOSIS — Z9981 Dependence on supplemental oxygen: Secondary | ICD-10-CM | POA: Diagnosis not present

## 2018-01-17 DIAGNOSIS — J449 Chronic obstructive pulmonary disease, unspecified: Secondary | ICD-10-CM | POA: Diagnosis not present

## 2018-01-17 DIAGNOSIS — I272 Pulmonary hypertension, unspecified: Secondary | ICD-10-CM | POA: Diagnosis not present

## 2018-01-17 DIAGNOSIS — E1122 Type 2 diabetes mellitus with diabetic chronic kidney disease: Secondary | ICD-10-CM | POA: Diagnosis not present

## 2018-01-17 DIAGNOSIS — L120 Bullous pemphigoid: Secondary | ICD-10-CM | POA: Diagnosis not present

## 2018-01-17 DIAGNOSIS — Z6841 Body Mass Index (BMI) 40.0 and over, adult: Secondary | ICD-10-CM | POA: Diagnosis not present

## 2018-01-17 DIAGNOSIS — Z794 Long term (current) use of insulin: Secondary | ICD-10-CM | POA: Diagnosis not present

## 2018-01-17 DIAGNOSIS — N183 Chronic kidney disease, stage 3 (moderate): Secondary | ICD-10-CM | POA: Diagnosis not present

## 2018-01-17 NOTE — Patient Outreach (Signed)
[facility]) Care Management  01/17/2018  Nina Howell 11-03-1940 [id number]   RNCM called client's daughter [name] ([phone]), per client's request to update regarding palliative care information and request for personal care services. No answer. HIPPA compliant message left.  Plan: await return call and continue to follow.  [name], RN, MSN, [place] Coordinator Cell: [phone]

## 2018-01-17 NOTE — Patient Outreach (Signed)
[facility]) Care Management  01/17/2018  Deaysia Grigoryan 1941/04/05 [id number]   RNCM called and spoke with client's daughter, [name]. RNCM informed Ms Bobby Rumpf that someone from care connections/palliative care would be contacting her per Mrs. Gage's request. RNCM also discussed Mr. [place] request about personal care service. [name] expressed concern about unlicensed people getting entrance code and coming into client's home. [name] states that client has the life alert in place.  [name] thanked [facility] for following up.  Plan: continue to follow.  [name], RN, MSN, [place] Coordinator Cell: [phone]

## 2018-01-21 DIAGNOSIS — Z7951 Long term (current) use of inhaled steroids: Secondary | ICD-10-CM | POA: Diagnosis not present

## 2018-01-21 DIAGNOSIS — Z9981 Dependence on supplemental oxygen: Secondary | ICD-10-CM | POA: Diagnosis not present

## 2018-01-21 DIAGNOSIS — I5042 Chronic combined systolic (congestive) and diastolic (congestive) heart failure: Secondary | ICD-10-CM | POA: Diagnosis not present

## 2018-01-21 DIAGNOSIS — E662 Morbid (severe) obesity with alveolar hypoventilation: Secondary | ICD-10-CM | POA: Diagnosis not present

## 2018-01-21 DIAGNOSIS — Z794 Long term (current) use of insulin: Secondary | ICD-10-CM | POA: Diagnosis not present

## 2018-01-21 DIAGNOSIS — Z6841 Body Mass Index (BMI) 40.0 and over, adult: Secondary | ICD-10-CM | POA: Diagnosis not present

## 2018-01-21 DIAGNOSIS — E1122 Type 2 diabetes mellitus with diabetic chronic kidney disease: Secondary | ICD-10-CM | POA: Diagnosis not present

## 2018-01-21 DIAGNOSIS — Z4800 Encounter for change or removal of nonsurgical wound dressing: Secondary | ICD-10-CM | POA: Diagnosis not present

## 2018-01-21 DIAGNOSIS — J449 Chronic obstructive pulmonary disease, unspecified: Secondary | ICD-10-CM | POA: Diagnosis not present

## 2018-01-21 DIAGNOSIS — L120 Bullous pemphigoid: Secondary | ICD-10-CM | POA: Diagnosis not present

## 2018-01-21 DIAGNOSIS — I272 Pulmonary hypertension, unspecified: Secondary | ICD-10-CM | POA: Diagnosis not present

## 2018-01-21 DIAGNOSIS — G4733 Obstructive sleep apnea (adult) (pediatric): Secondary | ICD-10-CM | POA: Diagnosis not present

## 2018-01-21 DIAGNOSIS — N183 Chronic kidney disease, stage 3 (moderate): Secondary | ICD-10-CM | POA: Diagnosis not present

## 2018-01-22 DIAGNOSIS — Z7951 Long term (current) use of inhaled steroids: Secondary | ICD-10-CM | POA: Diagnosis not present

## 2018-01-22 DIAGNOSIS — G4733 Obstructive sleep apnea (adult) (pediatric): Secondary | ICD-10-CM | POA: Diagnosis not present

## 2018-01-22 DIAGNOSIS — Z4800 Encounter for change or removal of nonsurgical wound dressing: Secondary | ICD-10-CM | POA: Diagnosis not present

## 2018-01-22 DIAGNOSIS — I272 Pulmonary hypertension, unspecified: Secondary | ICD-10-CM | POA: Diagnosis not present

## 2018-01-22 DIAGNOSIS — E1122 Type 2 diabetes mellitus with diabetic chronic kidney disease: Secondary | ICD-10-CM | POA: Diagnosis not present

## 2018-01-22 DIAGNOSIS — Z6841 Body Mass Index (BMI) 40.0 and over, adult: Secondary | ICD-10-CM | POA: Diagnosis not present

## 2018-01-22 DIAGNOSIS — Z794 Long term (current) use of insulin: Secondary | ICD-10-CM | POA: Diagnosis not present

## 2018-01-22 DIAGNOSIS — J449 Chronic obstructive pulmonary disease, unspecified: Secondary | ICD-10-CM | POA: Diagnosis not present

## 2018-01-22 DIAGNOSIS — Z9981 Dependence on supplemental oxygen: Secondary | ICD-10-CM | POA: Diagnosis not present

## 2018-01-22 DIAGNOSIS — N183 Chronic kidney disease, stage 3 (moderate): Secondary | ICD-10-CM | POA: Diagnosis not present

## 2018-01-22 DIAGNOSIS — L120 Bullous pemphigoid: Secondary | ICD-10-CM | POA: Diagnosis not present

## 2018-01-22 DIAGNOSIS — E662 Morbid (severe) obesity with alveolar hypoventilation: Secondary | ICD-10-CM | POA: Diagnosis not present

## 2018-01-22 DIAGNOSIS — I5042 Chronic combined systolic (congestive) and diastolic (congestive) heart failure: Secondary | ICD-10-CM | POA: Diagnosis not present

## 2018-01-23 ENCOUNTER — Other Ambulatory Visit (HOSPITAL_COMMUNITY): Payer: Self-pay

## 2018-01-23 ENCOUNTER — Other Ambulatory Visit: Payer: Self-pay

## 2018-01-23 NOTE — Progress Notes (Signed)
Paramedicine Encounter    Patient ID: Nina Howell, female    DOB: 11-Jan-1941, 77 y.o.   MRN: [id number]   Patient Care Team: Leeroy Cha, MD as PCP - General (Internal Medicine) Bensimhon, Shaune Pascal, MD as PCP - Cardiology (Cardiology) Delrae Rend, MD as Consulting Physician (Internal Medicine) Luretha Rued, RN as [place], Museum/gallery conservator as [place] Management  Patient Active Problem List   Diagnosis Date Noted  . CKD (chronic kidney disease) stage 3, GFR 30-59 ml/min (HCC) 12/28/2017  . DNR (do not resuscitate) 12/28/2017  . Pressure injury of skin 11/11/2017  . Chronic respiratory failure with hypoxia ([place]) 04/03/2017  . Pericardial effusion 11/23/2015  . Chronic diastolic heart failure ([place]) 11/23/2015  . Pulmonary hypertension, unspecified ([place]) 09/06/2015  . Acute on chronic congestive heart failure ([place])   . Rash and nonspecific skin eruption 11/27/2014  . Post-menopausal bleeding 06/18/2011  . Varicose veins 04/25/2011  . Unspecified venous (peripheral) insufficiency 04/25/2011  . Hemangioma of liver, left side 03/23/2011  . HYPOTHYROIDISM 04/27/2010  . Type 2 diabetes mellitus with complication, with long-term current use of insulin ([place]) 04/27/2010  . HYPOKALEMIA 04/27/2010  . Morbid obesity ([place]) 04/27/2010  . DEPRESSION 04/27/2010  . Obstructive sleep apnea 04/27/2010  . Essential hypertension 04/27/2010  . End Stage Biventricular heart failure ([place]) 04/27/2010  . Obesity hypoventilation syndrome ([place]) 04/27/2010    Current Outpatient Medications:  .  acetaminophen (TYLENOL) 500 MG tablet, Take 1,000 mg every 8 (eight) hours as needed by mouth for mild pain, moderate pain, fever or headache., Disp: , Rfl:  .  ALPRAZolam (XANAX) 0.5 MG tablet, Take 0.25 mg by mouth at bedtime as needed for anxiety. , Disp: , Rfl:  .  bumetanide (BUMEX) 2 MG tablet, Take 2 tablets (4 mg total) by mouth 2  (two) times daily., Disp: 360 tablet, Rfl: 3 .  cholecalciferol (VITAMIN D) 1000 units tablet, Take 1,000 Units by mouth daily with lunch. , Disp: , Rfl:  .  clobetasol ointment (TEMOVATE) 9.39 %, Apply 1 application topically 2 (two) times daily., Disp: , Rfl:  .  Cranberry-Vitamin C-Vitamin E 4200-20-3 MG-MG-UNIT CAPS, Take 4 capsules by mouth daily with lunch., Disp: , Rfl:  .  Cyanocobalamin (VITAMIN B-12) 2500 MCG SUBL, Place 2,500 mcg under the tongue daily with lunch. , Disp: , Rfl:  .  doxycycline (VIBRA-TABS) 100 MG tablet, Take 100 mg by mouth 2 (two) times daily., Disp: , Rfl:  .  fluticasone furoate-vilanterol (BREO ELLIPTA) 200-25 MCG/INH AEPB, Inhale 1 puff into the lungs daily., Disp: 1 each, Rfl: 3 .  guaiFENesin (MUCINEX) 600 MG 12 hr tablet, Take 1,200 mg by mouth 2 (two) times daily as needed for cough or to loosen phlegm. Started 10/30 , Disp: , Rfl:  .  insulin NPH-regular Human (NOVOLIN 70/30) (70-30) 100 UNIT/ML injection, Inject 16-26 Units into the skin See admin instructions. Inject 26 units subcutaneously daily after breakfast and 16 units after supper, Disp: , Rfl:  .  levothyroxine (SYNTHROID, LEVOTHROID) 50 MCG tablet, Take 50 mcg by mouth daily before breakfast. , Disp: , Rfl:  .  lidocaine (XYLOCAINE) 5 % ointment, Apply 1 application 2 (two) times daily topically. , Disp: , Rfl:  .  lipase/protease/amylase (CREON) 36000 UNITS CPEP capsule, Take 36,000-72,000 Units by mouth See admin instructions. Take 2 capsules (72,000 units) by mouth up to 3 times daily with meals, take 1 capsule (36,000 units) with snacks, Disp: , Rfl:  .  metolazone (ZAROXOLYN) 2.5 MG tablet, Take 1 tablet (2.5 mg total) by mouth See admin instructions. Take 1 tablet daily as needed for weight gain 3 lbs or more., Disp: 30 tablet, Rfl: 11 .  Multiple Vitamin (MULTIVITAMIN WITH MINERALS) TABS tablet, Take 0.5 tablets by mouth daily. Centrum Silver, Disp: , Rfl:  .  OXYGEN, Inhale 4 L into the lungs  continuous., Disp: , Rfl:  .  polyethylene glycol (MIRALAX / GLYCOLAX) packet, Take 17 g by mouth daily as needed., Disp: 14 each, Rfl: 0 .  potassium chloride SA (K-DUR,KLOR-CON) 20 MEQ tablet, Take 2 tablets (40 mEq total) by mouth 2 (two) times daily. (Patient taking differently: Take 20 mEq by mouth 3 (three) times daily. ), Disp: 120 tablet, Rfl: 11 .  PROAIR HFA 108 (90 Base) MCG/ACT inhaler, INHALE TWO PUFFS BY MOUTH EVERY 4 HOURS AS NEEDED FOR WHEEZING OR FOR SHORTNESS OF BREATH, Disp: 1 Inhaler, Rfl: 2 .  sertraline (ZOLOFT) 50 MG tablet, Take 50 mg by mouth at bedtime. , Disp: , Rfl:  .  spironolactone (ALDACTONE) 25 MG tablet, Take 1 tablet (25 mg total) by mouth daily with lunch. Please call [phone] to schedule appointment for additional refills thanks., Disp: 30 tablet, Rfl: 5 Allergies  Allergen Reactions  . Celebrex [Celecoxib] Swelling  . Penicillins Rash    Has patient had a PCN reaction causing immediate rash, facial/tongue/throat swelling, SOB or lightheadedness with hypotension: Yes Has patient had a PCN reaction causing severe rash involving mucus membranes or skin necrosis: Yes Has patient had a PCN reaction that required hospitalization: No Has patient had a PCN reaction occurring within the last 10 years: Yes - Okay taking other cillin's If all of the above answers are "NO", then may proceed with Cephalosporin use.   . Cephalexin Hives  . Lasix [Furosemide] Hives  . Oxycodone-Acetaminophen Nausea Only  . Cephalosporins Rash  . Oxycodone Anxiety    Felt weird      Social History   Socioeconomic History  . Marital status: Married    Spouse name: Not on file  . Number of children: 4  . Years of education: Not on file  . Highest education level: Not on file  Occupational History  . 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 Financial resource strain: Patient refused  . Food insecurity:    Worry: Patient refused     Inability: Patient refused  . Transportation needs:    Medical: Patient refused    Non-medical: Patient refused  Tobacco Use  . Smoking status: Never Smoker  . Smokeless tobacco: Never Used  Substance and Sexual Activity  . Alcohol use: No  . Drug use: No  . Sexual activity: Yes    Birth control/protection: IUD  Lifestyle  . Physical activity:    Days per week: Patient refused    Minutes per session: Patient refused  . Stress: Patient refused  Relationships  . Social connections:    Talks on phone: Patient refused    Gets together: Patient refused    Attends religious service: Patient refused    Active member of club or organization: Patient refused    Attends meetings of clubs or organizations: Patient refused    Relationship status: Patient refused  . Intimate partner violence:    Fear of current or ex partner: Patient refused    Emotionally abused: Patient refused    Physically abused: Patient refused    Forced sexual  activity: Patient refused  Other Topics Concern  . Not on file  Social History Narrative  . Not on file    Physical Exam      Future Appointments  Date Time Provider [place]  02/10/2018 11:00 AM Luretha Rued, RN [facility] None  02/19/2018 10:40 AM Bensimhon, Shaune Pascal, MD [facility] None    BP 106/86   Pulse 84   Resp 16   SpO2 97%   CBG PTA-122  Weight yesterday-276 Last visit weight-281  Pt reports that she is not feeling good, she has been "pooping" all night long, not able to eat much and feeling of nausea and weakness. She has a GI doc. She is on chronic doxy by her dermatologist. I advised her to all her derm and see what they recommend. She was referred to palliative care/care connections. She has refused hospice in the recent past. She questioned about palliative care and I advised her that hospice may be able to offer more services and they have nurses that are on call at night time and respond to her. She will talk to  family about it.  She reports her feet was swollen yesterday. She is still involved with PT but unable to do it due to her weakness.  She feels like she is sad sometimes b/c of what is happening to her and its out of her control.  Unable to weigh this morning.   [name], [facility] [facility] Paramedic  01/23/18

## 2018-01-23 NOTE — Patient Outreach (Signed)
[facility]) Care Management  01/23/2018  Nina Howell January 30, 1941 [id number]   Outreach to patient's daughter, Nina Howell, regarding social work referral for request for personal care services.  BSW informed Nina Howell that [place] are only provided to [facility] recipients but there are options of in-home providers that can be privately paid for.  Per Nina Howell, she and other family members are providing the necessary for Nina Howell at this time.  She said that they feel uncomfortable with various aides having access to the home.  She verbalized her understanding of palliative care and said that she feels as if her mother's needs will be met through these services.  BSW confirmed with her that RNCM, [name], placed a referral to [facility] on 01/17/18. BSW provided her with contact information for [facility] to inquire about status of the referral.  Nina Howell denied any other social work needs at this time.  She was encouraged to call BSW if additional needs arise.  Ronn Melena, BSW Social Worker [phone]

## 2018-01-24 ENCOUNTER — Telehealth (HOSPITAL_COMMUNITY): Payer: Self-pay | Admitting: Cardiology

## 2018-01-24 DIAGNOSIS — I5040 Unspecified combined systolic (congestive) and diastolic (congestive) heart failure: Secondary | ICD-10-CM | POA: Diagnosis not present

## 2018-01-24 NOTE — Telephone Encounter (Signed)
Amy intake coordinator with [facility] to request verbal for referral/physician assignment. Patient is now interested in beginning hospice services Dr Haroldine Laws to be attending of referral with the hospice physicians to manage symptoms

## 2018-01-25 DIAGNOSIS — I5040 Unspecified combined systolic (congestive) and diastolic (congestive) heart failure: Secondary | ICD-10-CM | POA: Diagnosis not present

## 2018-01-26 DIAGNOSIS — I5040 Unspecified combined systolic (congestive) and diastolic (congestive) heart failure: Secondary | ICD-10-CM | POA: Diagnosis not present

## 2018-01-27 DIAGNOSIS — I5040 Unspecified combined systolic (congestive) and diastolic (congestive) heart failure: Secondary | ICD-10-CM | POA: Diagnosis not present

## 2018-01-28 ENCOUNTER — Other Ambulatory Visit: Payer: Self-pay

## 2018-01-28 DIAGNOSIS — I5040 Unspecified combined systolic (congestive) and diastolic (congestive) heart failure: Secondary | ICD-10-CM | POA: Diagnosis not present

## 2018-01-28 NOTE — Patient Outreach (Addendum)
[facility]) Care Management  01/28/2018  Nina Howell 1940/09/05 [id number]   Care Coordination: Follow up regarding [facility] referral. RNCM spoke with [name], who reports they are waiting a return call from primary care. Per chart: [facility] involved. RNCM called [name] and Advanced heart failure clinic called to confirm.   Plan: await return call.  [name], RN, MSN, [place] Coordinator Cell: [phone]  01/28/18 1118 RNCM received return message from [name] stating that client is active with [name].

## 2018-01-28 NOTE — Patient Outreach (Signed)
[facility]) Care Management  01/28/2018  Nina Howell 31-Jan-1941 [id number]   Client active with [facility]. Per client's previous request, RNCM called and spoke with daughter, Nina Howell, who acknowledged client is receiving Hospice Services. RNCM discussed case closure. Nina Howell acknowledged understanding. Nina Howell expressed her appreciation for CM services.   Plan close case.  [name], RN, MSN, [place] Coordinator Cell: [phone]

## 2018-01-29 DIAGNOSIS — I5040 Unspecified combined systolic (congestive) and diastolic (congestive) heart failure: Secondary | ICD-10-CM | POA: Diagnosis not present

## 2018-01-30 ENCOUNTER — Telehealth: Payer: Self-pay | Admitting: Pulmonary Disease

## 2018-01-30 DIAGNOSIS — I5040 Unspecified combined systolic (congestive) and diastolic (congestive) heart failure: Secondary | ICD-10-CM | POA: Diagnosis not present

## 2018-01-30 DIAGNOSIS — J209 Acute bronchitis, unspecified: Secondary | ICD-10-CM

## 2018-01-30 NOTE — Telephone Encounter (Signed)
Spoke with [employer]. She stated that the patient is currently in the care of Hospice and can not use the [facility] inhaler. She wants to know if Dr. Elsworth Soho would be ok with the patient switching over to DuoNebs. I advised [name] that RA would not be back in the office until 9/16, she stated that would be ok.   If RA is ok with this, a new RX will need to be called in for the patient.   Dr. Elsworth Soho, please advise. Thanks!

## 2018-01-31 DIAGNOSIS — I5040 Unspecified combined systolic (congestive) and diastolic (congestive) heart failure: Secondary | ICD-10-CM | POA: Diagnosis not present

## 2018-02-01 DIAGNOSIS — I5040 Unspecified combined systolic (congestive) and diastolic (congestive) heart failure: Secondary | ICD-10-CM | POA: Diagnosis not present

## 2018-02-02 DIAGNOSIS — I5040 Unspecified combined systolic (congestive) and diastolic (congestive) heart failure: Secondary | ICD-10-CM | POA: Diagnosis not present

## 2018-02-03 DIAGNOSIS — I5040 Unspecified combined systolic (congestive) and diastolic (congestive) heart failure: Secondary | ICD-10-CM | POA: Diagnosis not present

## 2018-02-03 NOTE — Telephone Encounter (Signed)
Spoke with [name] at hospice, made aware RA is discontuing Breo and starting Duonebs Q6H PRN and Pulmicort 0.5 BID. [name] voices duoneb is covered but pulmicort is not. She is going to find out if pulmicort is covered via hospice and call us back. And she will also call back with what pharmacy she wants the script sent to after speaking to patient.

## 2018-02-03 NOTE — Telephone Encounter (Signed)
Called [name], unable to reach left message to give [place] a call back.

## 2018-02-03 NOTE — Telephone Encounter (Signed)
LMOM TCB x1 for [facility]

## 2018-02-03 NOTE — Telephone Encounter (Signed)
Okay to do duo nebs only

## 2018-02-03 NOTE — Telephone Encounter (Signed)
[name] returning call -she can be reached at [phone]

## 2018-02-03 NOTE — Telephone Encounter (Signed)
Spoke with Nina Howell at [place] is covered, she wants sent to [employer] [facility] at [employer]. Order sent. Will let RA know Pulmicort is not covered.   RA pulmicort is not covered via hospice, is there anything else you would like to put her on?

## 2018-02-03 NOTE — Telephone Encounter (Signed)
I have reviewed cardiologist Dr. Clayborne Dana notes. 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  Appreciate Tammy's discussing concerns with the patient Okay to discontinue Breo. Change to Pulmicort 0.5 twice daily and duo nebs every 6 hours as needed

## 2018-02-04 DIAGNOSIS — I5040 Unspecified combined systolic (congestive) and diastolic (congestive) heart failure: Secondary | ICD-10-CM | POA: Diagnosis not present

## 2018-02-04 NOTE — Telephone Encounter (Signed)
Called [name] but there was no answer. Left message for [name] to call back.

## 2018-02-04 NOTE — Telephone Encounter (Signed)
Spoke with renee and advised the message from provider. She understood and nothing further is needed. Rx was sent in yesterday.

## 2018-02-05 DIAGNOSIS — I5040 Unspecified combined systolic (congestive) and diastolic (congestive) heart failure: Secondary | ICD-10-CM | POA: Diagnosis not present

## 2018-02-06 ENCOUNTER — Other Ambulatory Visit (HOSPITAL_COMMUNITY): Payer: Self-pay

## 2018-02-06 DIAGNOSIS — I5040 Unspecified combined systolic (congestive) and diastolic (congestive) heart failure: Secondary | ICD-10-CM | POA: Diagnosis not present

## 2018-02-06 NOTE — Progress Notes (Signed)
Paramedicine Encounter    Patient ID: Nina Howell, female    DOB: March 10, 1941, 77 y.o.   MRN: [id number]   Patient Care Team: Leeroy Cha, MD as PCP - General (Internal Medicine) Bensimhon, Shaune Pascal, MD as PCP - Cardiology (Cardiology) Delrae Rend, MD as Consulting Physician (Internal Medicine)  Patient Active Problem List   Diagnosis Date Noted  . CKD (chronic kidney disease) stage 3, GFR 30-59 ml/min (HCC) 12/28/2017  . DNR (do not resuscitate) 12/28/2017  . Pressure injury of skin 11/11/2017  . Chronic respiratory failure with hypoxia ([place]) 04/03/2017  . Pericardial effusion 11/23/2015  . Chronic diastolic heart failure ([place]) 11/23/2015  . Pulmonary hypertension, unspecified ([place]) 09/06/2015  . Acute on chronic congestive heart failure ([place])   . Rash and nonspecific skin eruption 11/27/2014  . Post-menopausal bleeding 06/18/2011  . Varicose veins 04/25/2011  . Unspecified venous (peripheral) insufficiency 04/25/2011  . Hemangioma of liver, left side 03/23/2011  . HYPOTHYROIDISM 04/27/2010  . Type 2 diabetes mellitus with complication, with long-term current use of insulin ([place]) 04/27/2010  . HYPOKALEMIA 04/27/2010  . Morbid obesity ([place]) 04/27/2010  . DEPRESSION 04/27/2010  . Obstructive sleep apnea 04/27/2010  . Essential hypertension 04/27/2010  . End Stage Biventricular heart failure ([place]) 04/27/2010  . Obesity hypoventilation syndrome ([place]) 04/27/2010    Current Outpatient Medications:  .  acetaminophen (TYLENOL) 500 MG tablet, Take 1,000 mg every 8 (eight) hours as needed by mouth for mild pain, moderate pain, fever or headache., Disp: , Rfl:  .  ALPRAZolam (XANAX) 0.5 MG tablet, Take 0.25 mg by mouth at bedtime as needed for anxiety. , Disp: , Rfl:  .  bumetanide (BUMEX) 2 MG tablet, Take 2 tablets (4 mg total) by mouth 2 (two) times daily., Disp: 360 tablet, Rfl: 3 .  cholecalciferol (VITAMIN D) 1000 units tablet, Take 1,000 Units by mouth daily  with lunch. , Disp: , Rfl:  .  clobetasol ointment (TEMOVATE) 6.29 %, Apply 1 application topically 2 (two) times daily., Disp: , Rfl:  .  Cranberry-Vitamin C-Vitamin E 4200-20-3 MG-MG-UNIT CAPS, Take 4 capsules by mouth daily with lunch., Disp: , Rfl:  .  Cyanocobalamin (VITAMIN B-12) 2500 MCG SUBL, Place 2,500 mcg under the tongue daily with lunch. , Disp: , Rfl:  .  doxycycline (VIBRA-TABS) 100 MG tablet, Take 100 mg by mouth 2 (two) times daily., Disp: , Rfl:  .  fluticasone furoate-vilanterol (BREO ELLIPTA) 200-25 MCG/INH AEPB, Inhale 1 puff into the lungs daily., Disp: 1 each, Rfl: 3 .  guaiFENesin (MUCINEX) 600 MG 12 hr tablet, Take 1,200 mg by mouth 2 (two) times daily as needed for cough or to loosen phlegm. Started 10/30 , Disp: , Rfl:  .  insulin NPH-regular Human (NOVOLIN 70/30) (70-30) 100 UNIT/ML injection, Inject 16-26 Units into the skin See admin instructions. Inject 26 units subcutaneously daily after breakfast and 16 units after supper, Disp: , Rfl:  .  levothyroxine (SYNTHROID, LEVOTHROID) 50 MCG tablet, Take 50 mcg by mouth daily before breakfast. , Disp: , Rfl:  .  lidocaine (XYLOCAINE) 5 % ointment, Apply 1 application 2 (two) times daily topically. , Disp: , Rfl:  .  lipase/protease/amylase (CREON) 36000 UNITS CPEP capsule, Take 36,000-72,000 Units by mouth See admin instructions. Take 2 capsules (72,000 units) by mouth up to 3 times daily with meals, take 1 capsule (36,000 units) with snacks, Disp: , Rfl:  .  metolazone (ZAROXOLYN) 2.5 MG tablet, Take 1 tablet (2.5 mg total) by mouth See admin instructions. Take  1 tablet daily as needed for weight gain 3 lbs or more., Disp: 30 tablet, Rfl: 11 .  Multiple Vitamin (MULTIVITAMIN WITH MINERALS) TABS tablet, Take 0.5 tablets by mouth daily. Centrum Silver, Disp: , Rfl:  .  OXYGEN, Inhale 4 L into the lungs continuous., Disp: , Rfl:  .  polyethylene glycol (MIRALAX / GLYCOLAX) packet, Take 17 g by mouth daily as needed., Disp: 14  each, Rfl: 0 .  potassium chloride SA (K-DUR,KLOR-CON) 20 MEQ tablet, Take 2 tablets (40 mEq total) by mouth 2 (two) times daily. (Patient taking differently: Take 20 mEq by mouth 3 (three) times daily. ), Disp: 120 tablet, Rfl: 11 .  PROAIR HFA 108 (90 Base) MCG/ACT inhaler, INHALE TWO PUFFS BY MOUTH EVERY 4 HOURS AS NEEDED FOR WHEEZING OR FOR SHORTNESS OF BREATH, Disp: 1 Inhaler, Rfl: 2 .  sertraline (ZOLOFT) 50 MG tablet, Take 50 mg by mouth at bedtime. , Disp: , Rfl:  .  spironolactone (ALDACTONE) 25 MG tablet, Take 1 tablet (25 mg total) by mouth daily with lunch. Please call [phone] to schedule appointment for additional refills thanks., Disp: 30 tablet, Rfl: 5 Allergies  Allergen Reactions  . Celebrex [Celecoxib] Swelling  . Penicillins Rash    Has patient had a PCN reaction causing immediate rash, facial/tongue/throat swelling, SOB or lightheadedness with hypotension: Yes Has patient had a PCN reaction causing severe rash involving mucus membranes or skin necrosis: Yes Has patient had a PCN reaction that required hospitalization: No Has patient had a PCN reaction occurring within the last 10 years: Yes - Okay taking other cillin's If all of the above answers are "NO", then may proceed with Cephalosporin use.   . Cephalexin Hives  . Lasix [Furosemide] Hives  . Oxycodone-Acetaminophen Nausea Only  . Cephalosporins Rash  . Oxycodone Anxiety    Felt weird      Social History   Socioeconomic History  . Marital status: Married    Spouse name: Not on file  . Number of children: 4  . Years of education: Not on file  . Highest education level: Not on file  Occupational History  . 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 Financial resource strain: Patient refused  . Food insecurity:    Worry: Patient refused    Inability: Patient refused  . Transportation needs:    Medical: Patient refused    Non-medical: Patient refused   Tobacco Use  . Smoking status: Never Smoker  . Smokeless tobacco: Never Used  Substance and Sexual Activity  . Alcohol use: No  . Drug use: No  . Sexual activity: Yes    Birth control/protection: IUD  Lifestyle  . Physical activity:    Days per week: Patient refused    Minutes per session: Patient refused  . Stress: Patient refused  Relationships  . Social connections:    Talks on phone: Patient refused    Gets together: Patient refused    Attends religious service: Patient refused    Active member of club or organization: Patient refused    Attends meetings of clubs or organizations: Patient refused    Relationship status: Patient refused  . Intimate partner violence:    Fear of current or ex partner: Patient refused    Emotionally abused: Patient refused    Physically abused: Patient refused    Forced sexual activity: Patient refused  Other Topics Concern  . Not on file  Social History Narrative  .  Not on file    Physical Exam      Future Appointments  Date Time Provider [place]  02/19/2018 10:40 AM Bensimhon, Shaune Pascal, MD [facility] None    Pulse 98   SpO2 98%   Pt seen at home today, she is now in hospice care. She has hospice bed and hoyer lift. Pt states nurses come out 2X a week.  pts breathing is labored this morning, she reports that its always labored now. She is very tired and weak, her husband is at work today. She reports daughter will come by soon and bathe her.  She had not taken her meds yet this morning, so I reminded her to take them when she can.  Pt was very thankful for services and appreciate all the providers in the clinic. With her being bound in bed her legs have decreased swelling to them. She was happy about that. She isnt sleeping much at night time.  Nurses are aware of this as well and pt reports hospice is working on getting her meds covered. She would like me to check in with her next week.    [name],  [facility] Paramedic  02/06/18

## 2018-02-07 DIAGNOSIS — I5040 Unspecified combined systolic (congestive) and diastolic (congestive) heart failure: Secondary | ICD-10-CM | POA: Diagnosis not present

## 2018-02-08 DIAGNOSIS — I5040 Unspecified combined systolic (congestive) and diastolic (congestive) heart failure: Secondary | ICD-10-CM | POA: Diagnosis not present

## 2018-02-09 DIAGNOSIS — I5040 Unspecified combined systolic (congestive) and diastolic (congestive) heart failure: Secondary | ICD-10-CM | POA: Diagnosis not present

## 2018-02-10 ENCOUNTER — Telehealth (HOSPITAL_COMMUNITY): Payer: Self-pay | Admitting: *Deleted

## 2018-02-10 ENCOUNTER — Ambulatory Visit: Payer: BLUE CROSS/BLUE SHIELD

## 2018-02-10 DIAGNOSIS — I5040 Unspecified combined systolic (congestive) and diastolic (congestive) heart failure: Secondary | ICD-10-CM | POA: Diagnosis not present

## 2018-02-10 NOTE — Telephone Encounter (Signed)
HHRN called to inform us that patient is moving to [facility] for end of life care as soon as a bed is available.

## 2018-02-11 ENCOUNTER — Telehealth (HOSPITAL_COMMUNITY): Payer: Self-pay

## 2018-02-11 DIAGNOSIS — I5040 Unspecified combined systolic (congestive) and diastolic (congestive) heart failure: Secondary | ICD-10-CM | POA: Diagnosis not present

## 2018-02-11 NOTE — Telephone Encounter (Signed)
Contacted family this morning, pts husband advised she passed away last night at home. Hospice came to support family.  Advised him to contact us if he needed anything.    [name], EMT-Paramedic  01/31/2018

## 2018-02-14 ENCOUNTER — Encounter (HOSPITAL_COMMUNITY): Payer: Self-pay | Admitting: *Deleted

## 2018-02-14 NOTE — Progress Notes (Signed)
Received pt's Death Certificate from [place], form completed and signed by Dr Haroldine Laws.  FH is aware to p/u

## 2018-02-18 DEATH — deceased

## 2018-02-19 ENCOUNTER — Encounter (HOSPITAL_COMMUNITY): Payer: BLUE CROSS/BLUE SHIELD | Admitting: Internal Medicine
